# Patient Record
Sex: Male | Born: 1940 | Race: White | Hispanic: No | Marital: Married | Smoking: Former smoker
Health system: Southern US, Community
[De-identification: ages and names within clinical notes are randomized; demographics above are authoritative.]

## PROBLEM LIST (undated history)

## (undated) DIAGNOSIS — I350 Nonrheumatic aortic (valve) stenosis: Secondary | ICD-10-CM

## (undated) DIAGNOSIS — I10 Essential (primary) hypertension: Secondary | ICD-10-CM

## (undated) DIAGNOSIS — E785 Hyperlipidemia, unspecified: Secondary | ICD-10-CM

## (undated) DIAGNOSIS — I2510 Atherosclerotic heart disease of native coronary artery without angina pectoris: Secondary | ICD-10-CM

## (undated) DIAGNOSIS — N189 Chronic kidney disease, unspecified: Secondary | ICD-10-CM

## (undated) DIAGNOSIS — Z952 Presence of prosthetic heart valve: Secondary | ICD-10-CM

## (undated) DIAGNOSIS — E119 Type 2 diabetes mellitus without complications: Secondary | ICD-10-CM

## (undated) DIAGNOSIS — I4510 Unspecified right bundle-branch block: Secondary | ICD-10-CM

## (undated) DIAGNOSIS — I480 Paroxysmal atrial fibrillation: Secondary | ICD-10-CM

## (undated) DIAGNOSIS — J449 Chronic obstructive pulmonary disease, unspecified: Secondary | ICD-10-CM

## (undated) DIAGNOSIS — E669 Obesity, unspecified: Secondary | ICD-10-CM

## (undated) HISTORY — PX: CARDIAC SURGERY: SHX584

## (undated) HISTORY — PX: FINGER AMPUTATION: SHX636

## (undated) HISTORY — PX: HERNIA REPAIR: SHX51

## (undated) HISTORY — PX: OTHER SURGICAL HISTORY: SHX169

## (undated) HISTORY — PX: TOE AMPUTATION: SHX809

---

## 2000-02-06 ENCOUNTER — Ambulatory Visit (HOSPITAL_BASED_OUTPATIENT_CLINIC_OR_DEPARTMENT_OTHER): Admission: RE | Admit: 2000-02-06 | Discharge: 2000-02-06 | Payer: Self-pay | Admitting: Orthopedic Surgery

## 2001-07-25 ENCOUNTER — Ambulatory Visit (HOSPITAL_COMMUNITY): Admission: RE | Admit: 2001-07-25 | Discharge: 2001-07-25 | Payer: Self-pay | Admitting: Cardiology

## 2001-07-25 ENCOUNTER — Encounter: Payer: Self-pay | Admitting: Cardiology

## 2001-08-07 ENCOUNTER — Ambulatory Visit (HOSPITAL_COMMUNITY): Admission: RE | Admit: 2001-08-07 | Discharge: 2001-08-07 | Payer: Self-pay | Admitting: Cardiology

## 2001-08-08 ENCOUNTER — Encounter: Payer: Self-pay | Admitting: Cardiology

## 2001-08-12 ENCOUNTER — Encounter: Payer: Self-pay | Admitting: Cardiothoracic Surgery

## 2001-08-12 ENCOUNTER — Inpatient Hospital Stay (HOSPITAL_COMMUNITY): Admission: RE | Admit: 2001-08-12 | Discharge: 2001-08-19 | Payer: Self-pay | Admitting: Cardiothoracic Surgery

## 2001-08-13 ENCOUNTER — Encounter: Payer: Self-pay | Admitting: Cardiothoracic Surgery

## 2001-08-14 ENCOUNTER — Encounter: Payer: Self-pay | Admitting: Cardiothoracic Surgery

## 2001-08-15 ENCOUNTER — Encounter: Payer: Self-pay | Admitting: Cardiothoracic Surgery

## 2001-09-05 ENCOUNTER — Encounter: Payer: Self-pay | Admitting: Cardiothoracic Surgery

## 2001-09-05 ENCOUNTER — Encounter: Admission: RE | Admit: 2001-09-05 | Discharge: 2001-09-05 | Payer: Self-pay | Admitting: Cardiothoracic Surgery

## 2001-12-23 ENCOUNTER — Encounter: Admission: RE | Admit: 2001-12-23 | Discharge: 2001-12-23 | Payer: Self-pay | Admitting: Gastroenterology

## 2001-12-23 ENCOUNTER — Encounter: Payer: Self-pay | Admitting: Gastroenterology

## 2002-01-20 ENCOUNTER — Ambulatory Visit (HOSPITAL_COMMUNITY): Admission: RE | Admit: 2002-01-20 | Discharge: 2002-01-20 | Payer: Self-pay | Admitting: Gastroenterology

## 2002-02-09 ENCOUNTER — Inpatient Hospital Stay (HOSPITAL_COMMUNITY): Admission: RE | Admit: 2002-02-09 | Discharge: 2002-02-14 | Payer: Self-pay | Admitting: General Surgery

## 2002-02-23 ENCOUNTER — Encounter: Payer: Self-pay | Admitting: General Surgery

## 2002-02-23 ENCOUNTER — Encounter: Admission: RE | Admit: 2002-02-23 | Discharge: 2002-02-23 | Payer: Self-pay | Admitting: General Surgery

## 2003-09-30 ENCOUNTER — Observation Stay (HOSPITAL_COMMUNITY): Admission: EM | Admit: 2003-09-30 | Discharge: 2003-09-30 | Payer: Self-pay | Admitting: Emergency Medicine

## 2013-02-22 ENCOUNTER — Emergency Department (HOSPITAL_COMMUNITY)
Admission: EM | Admit: 2013-02-22 | Discharge: 2013-02-22 | Disposition: A | Discharge status: Home / Self Care | Payer: Medicare Other | Attending: Emergency Medicine | Admitting: Emergency Medicine

## 2013-02-22 ENCOUNTER — Emergency Department (HOSPITAL_COMMUNITY): Payer: Medicare Other

## 2013-02-22 ENCOUNTER — Encounter (HOSPITAL_COMMUNITY): Payer: Self-pay | Admitting: *Deleted

## 2013-02-22 DIAGNOSIS — Z79899 Other long term (current) drug therapy: Secondary | ICD-10-CM | POA: Insufficient documentation

## 2013-02-22 DIAGNOSIS — J302 Other seasonal allergic rhinitis: Secondary | ICD-10-CM

## 2013-02-22 DIAGNOSIS — Z7982 Long term (current) use of aspirin: Secondary | ICD-10-CM | POA: Insufficient documentation

## 2013-02-22 DIAGNOSIS — I10 Essential (primary) hypertension: Secondary | ICD-10-CM | POA: Insufficient documentation

## 2013-02-22 DIAGNOSIS — N390 Urinary tract infection, site not specified: Secondary | ICD-10-CM | POA: Insufficient documentation

## 2013-02-22 DIAGNOSIS — Z87891 Personal history of nicotine dependence: Secondary | ICD-10-CM | POA: Insufficient documentation

## 2013-02-22 DIAGNOSIS — J301 Allergic rhinitis due to pollen: Secondary | ICD-10-CM | POA: Insufficient documentation

## 2013-02-22 DIAGNOSIS — E119 Type 2 diabetes mellitus without complications: Secondary | ICD-10-CM | POA: Insufficient documentation

## 2013-02-22 HISTORY — DX: Type 2 diabetes mellitus without complications: E11.9

## 2013-02-22 HISTORY — DX: Essential (primary) hypertension: I10

## 2013-02-22 LAB — URINALYSIS, ROUTINE W REFLEX MICROSCOPIC
Bilirubin Urine: NEGATIVE
Glucose, UA: >1000 mg/dL — AB
Ketones, ur: NEGATIVE mg/dL
Leukocytes, UA: NEGATIVE
Nitrite: NEGATIVE
Protein, ur: 100 mg/dL — AB
Specific Gravity, Urine: 1.028 (ref 1.005–1.030)
Urobilinogen, UA: 1 mg/dL (ref 0.0–1.0)
pH: 5.5 (ref 5.0–8.0)

## 2013-02-22 LAB — CBC
MCH: 29.8 pg (ref 26.0–34.0)
MCHC: 35.2 g/dL (ref 30.0–36.0)
MCV: 84.8 fL (ref 78.0–100.0)
Platelets: 133 10*3/uL — ABNORMAL LOW (ref 150–400)
RDW: 14.1 % (ref 11.5–15.5)

## 2013-02-22 LAB — BASIC METABOLIC PANEL
Calcium: 9.1 mg/dL (ref 8.4–10.5)
Creatinine, Ser: 1.95 mg/dL — ABNORMAL HIGH (ref 0.50–1.35)
GFR calc Af Amer: 38 mL/min — ABNORMAL LOW (ref >90)
GFR calc non Af Amer: 33 mL/min — ABNORMAL LOW (ref >90)

## 2013-02-22 MED ORDER — ACETAMINOPHEN 325 MG PO TABS
650.0000 mg | ORAL_TABLET | Freq: Once | ORAL | Status: AC
  Administered 2013-02-22: 650 mg via ORAL
  Filled 2013-02-22: qty 2

## 2013-02-22 MED ORDER — SODIUM CHLORIDE 0.9 % IV BOLUS (SEPSIS)
1000.0000 mL | Freq: Once | INTRAVENOUS | Status: AC
  Administered 2013-02-22: 1000 mL via INTRAVENOUS

## 2013-02-22 MED ORDER — DEXTROSE 5 % IV SOLN
1.0000 g | Freq: Once | INTRAVENOUS | Status: AC
  Administered 2013-02-22: 1 g via INTRAVENOUS
  Filled 2013-02-22: qty 10

## 2013-02-22 MED ORDER — LORATADINE 10 MG PO TABS: 10.0000 mg | ORAL_TABLET | Freq: Every day | ORAL | Status: DC

## 2013-02-22 MED ORDER — SODIUM CHLORIDE 0.9 % IV BOLUS (SEPSIS): 1000.0000 mL | Freq: Once | INTRAVENOUS | Status: DC

## 2013-02-22 MED ORDER — CEPHALEXIN 500 MG PO CAPS: 500.0000 mg | ORAL_CAPSULE | Freq: Four times a day (QID) | ORAL | Status: DC

## 2013-02-22 NOTE — ED Notes (Signed)
[id number] Expected date:<BR> Expected time:<BR> Means of arrival:<BR> Comments:<BR> Lovena Le

## 2013-02-22 NOTE — ED Notes (Signed)
Pt reports he has been having sinus headache and seasonal allergies, also reports having urinary frequency and incontinence since Friday.

## 2013-02-22 NOTE — ED Provider Notes (Signed)
History     CSN: [id number]  Arrival date & time 02/22/13  1201   First MD Initiated Contact with Patient 02/22/13 1500      No chief complaint on file.   (Consider location/radiation/quality/duration/timing/severity/associated sxs/prior treatment) HPI Pt presenting with c/o urinary frequency, mild dysuria.  No fever/chills.  Has had some nausea, no vomiting.  No abdominal pain.  States he didn't eat much yesterday because he was feeling nauseated.  He was afraid to take his insulin.  He also describes sinus congestion, sneezing and frontal headache which he attributes to allergy symptoms.  Also nonproductive cough.  There are no other associated systemic symptoms, there are no other alleviating or modifying factors.   Past Medical History  Diagnosis Date  . Hypertension   . Diabetes mellitus without complication     Past Surgical History  Procedure Laterality Date  . Open heart surgery      History reviewed. No pertinent family history.  History  Substance Use Topics  . Smoking status: Former Smoker    Quit date: 10/29/1978  . Smokeless tobacco: Not on file  . Alcohol Use: No      Review of Systems ROS reviewed and all otherwise negative except for mentioned in HPI  Allergies  Review of patient's allergies indicates no known allergies.  Home Medications   Current Outpatient Rx  Name  Route  Sig  Dispense  Refill  . aspirin 325 MG tablet   Oral   Take 325 mg by mouth daily.         Marland Kitchen diltiazem (TIAZAC) 300 MG 24 hr capsule   Oral   Take 300 mg by mouth daily.         . insulin NPH (HUMULIN N,NOVOLIN N) 100 UNIT/ML injection   Subcutaneous   Inject 30 Units into the skin 3 (three) times daily.         . insulin regular (NOVOLIN R,HUMULIN R) 100 units/mL injection   Subcutaneous   Inject 50 Units into the skin 3 (three) times daily before meals.         . LUTEIN PO   Oral   Take 1 capsule by mouth at bedtime.         . metoprolol  (LOPRESSOR) 50 MG tablet   Oral   Take 50 mg by mouth daily.         . simvastatin (ZOCOR) 20 MG tablet   Oral   Take 20 mg by mouth daily.         . trandolapril (MAVIK) 4 MG tablet   Oral   Take 4 mg by mouth daily.         . cephALEXin (KEFLEX) 500 MG capsule   Oral   Take 1 capsule (500 mg total) by mouth 4 (four) times daily.   20 capsule   0   . loratadine (CLARITIN) 10 MG tablet   Oral   Take 1 tablet (10 mg total) by mouth daily.   30 tablet   0     BP 156/76  Pulse 105  Temp(Src) 99.1 F (37.3 C) (Oral)  Resp 16  SpO2 94% Vitals reviewed Physical Exam Physical Examination: General appearance - alert, well appearing, and in no distress Mental status - alert, oriented to person, place, and time Eyes - no scleral icterus, no conjunctival injection Mouth - mucous membranes moist, pharynx normal without lesions Chest - clear to auscultation, no wheezes, rales or rhonchi, symmetric air entry Heart - normal  rate, regular rhythm, normal S1, S2, no murmurs, rubs, clicks or gallops Abdomen - soft, nontender, nondistended, no masses or organomegaly Extremities - peripheral pulses normal, no pedal edema, no clubbing or cyanosis Skin - normal coloration and turgor, no rashes  ED Course  Procedures (including critical care time)  4:45 PM pt rechecked,  meds and fluids have not been started that I ordered.  Pt is frustrated.  I have talked with the nurse to please hang the meds and fluids as soon as possible.    Labs Reviewed  URINALYSIS, ROUTINE W REFLEX MICROSCOPIC - Abnormal; Notable for the following:    Glucose, UA >1000 (*)    Hgb urine dipstick SMALL (*)    Protein, ur 100 (*)    All other components within normal limits  URINE MICROSCOPIC-ADD ON - Abnormal; Notable for the following:    Bacteria, UA MANY (*)    All other components within normal limits  CBC - Abnormal; Notable for the following:    WBC 19.4 (*)    Platelets 133 (*)    All other  components within normal limits  BASIC METABOLIC PANEL - Abnormal; Notable for the following:    Sodium 132 (*)    Glucose, Bld 261 (*)    BUN 32 (*)    Creatinine, Ser 1.95 (*)    GFR calc non Af Amer 33 (*)    GFR calc Af Amer 38 (*)    All other components within normal limits  URINE CULTURE   Dg Chest 2 View  02/22/2013  *RADIOLOGY REPORT*  Clinical Data: Cough  CHEST - 2 VIEW  Comparison: None.  Findings: Cardiac enlargement.  Prior CABG.  Negative for heart failure.  Negative for pneumonia or effusion.  Mild atelectasis or scarring in both lung bases.  Chronic right rib fractures.  IMPRESSION: No acute abnormality.   Original Report Authenticated By: Carl Best, M.D.      1. Urinary tract infection   2. Seasonal allergies       MDM  Pt presenting with c/o urinary freqeuncy, low grade fever.  Also c/o seasonal allergy symptoms and cough.  Labs reveal UTI, pt with low grade fever and mild tachycardia- treated with rocephin IV as well as IV fluids.  HR improving.  CXR reassuring.  Pt requesting discharge multiple times during ED stay.  Advised claritin for allergy symptoms.  Pt states headache feels better after fluids.  No vomiting.  Will discharge on keflex po.  Close followup with his PMD.  Discharged with strict return precautions.  Pt agreeable with plan.        Threasa Beards, MD 02/22/13 2023

## 2013-02-22 NOTE — ED Notes (Signed)
Patient is alert and oriented x3.  He was given DC instructions and follow up visit instructions.  Patient gave verbal understanding.  He was DC ambulatory under his own power to home.  V/S stable.  He was not showing any signs of distress on DC 

## 2013-02-22 NOTE — ED Notes (Signed)
Patient transported to X-ray 

## 2013-02-24 LAB — URINE CULTURE: Colony Count: >=100000

## 2013-02-25 NOTE — ED Notes (Signed)
+   urine Patient treated with kelfex-sensitive to same-chart appended per protocol MD.

## 2013-12-11 ENCOUNTER — Ambulatory Visit (INDEPENDENT_AMBULATORY_CARE_PROVIDER_SITE_OTHER): Payer: Medicare Other | Admitting: Internal Medicine

## 2013-12-11 ENCOUNTER — Encounter: Payer: Self-pay | Admitting: Internal Medicine

## 2013-12-11 ENCOUNTER — Ambulatory Visit (INDEPENDENT_AMBULATORY_CARE_PROVIDER_SITE_OTHER)
Admission: RE | Admit: 2013-12-11 | Discharge: 2013-12-11 | Disposition: A | Discharge status: Home / Self Care | Payer: Medicare Other | Source: Ambulatory Visit | Attending: Internal Medicine | Admitting: Internal Medicine

## 2013-12-11 VITALS — BP 132/70 | HR 82 | Temp 97.5°F | Ht 69.0 in | Wt 233.4 lb

## 2013-12-11 DIAGNOSIS — I10 Essential (primary) hypertension: Secondary | ICD-10-CM

## 2013-12-11 DIAGNOSIS — J449 Chronic obstructive pulmonary disease, unspecified: Secondary | ICD-10-CM

## 2013-12-11 DIAGNOSIS — R05 Cough: Secondary | ICD-10-CM

## 2013-12-11 DIAGNOSIS — R058 Other specified cough: Secondary | ICD-10-CM

## 2013-12-11 DIAGNOSIS — R059 Cough, unspecified: Secondary | ICD-10-CM

## 2013-12-11 MED ORDER — IRBESARTAN 150 MG PO TABS: 150.0000 mg | ORAL_TABLET | Freq: Every day | ORAL | Status: DC

## 2013-12-11 NOTE — Progress Notes (Signed)
   Subjective:    Patient ID: Cameron [name], male    DOB: [birth year]   MRN: [id number]  HPI  57 yowm quit smoking 1979 and no sequelae ref with new onset cough summer 2014 and then onset of doe referred 12/11/2013 to pulmonary clinic by Dr Doyle Askew   12/11/2013 1st [facility] office visit/ Cameron [name]  Chief Complaint  Patient presents with  . Pulmonary Consult    Referred per Dr. Jackolyn Confer. Pt c/o non prod cough since July 2014. He also c/o DOE with walking to mailbox and back and has occ CP.    new onset July 2014 some better with inhalers but left him feeling wired. Sometimes sob at rest, other times can walk a mile. Problems with cp dating back to heart surgery 30 y .prior to [place]  Already eval by Al Little felt to be due to neuralgia.  No obvious day to day or daytime variabilty or assoc  Classically pleuritic cp or chest tightness, subjective wheeze overt sinus or hb symptoms. No unusual exp hx or h/o childhood pna/ asthma or knowledge of premature birth.  Sleeping ok without nocturnal  or early am exacerbation  of respiratory  c/o's or need for noct saba. Also denies any obvious fluctuation of symptoms with weather or environmental changes or other aggravating or alleviating factors except as outlined above   Current Medications, Allergies, Complete Past Medical History, Past Surgical History, Family History, and Social History were reviewed in [employer] record.             Review of Systems  Constitutional: Negative for fever, chills, activity change, appetite change and unexpected weight change.  HENT: Negative for congestion, dental problem, postnasal drip, rhinorrhea, sneezing, sore throat, trouble swallowing and voice change.   Eyes: Negative for visual disturbance.  Respiratory: Positive for cough and shortness of breath. Negative for choking.   Cardiovascular: Positive for chest pain and leg swelling.  Gastrointestinal: Negative for nausea,  vomiting and abdominal pain.  Genitourinary: Negative for difficulty urinating.  Musculoskeletal: Negative for arthralgias.  Skin: Negative for rash.  Psychiatric/Behavioral: Negative for behavioral problems and confusion.       Objective:   Physical Exam  Hoarse amb wm nad  Wt Readings from Last 3 Encounters:  12/11/13 233 lb 6.4 oz (105.87 kg)      HEENT: nl dentition, turbinates, and orophanx. Nl external ear canals without cough reflex   NECK :  without JVD/Nodes/TM/ nl carotid upstrokes bilaterally   LUNGS: no acc muscle use, clear to A and P bilaterally without cough on insp or exp maneuvers   CV:  RRR  II/ VI SEM  no s3   or increase in P2, 1+ on R edema / trace on L   ABD: obese/  soft and nontender with nl excursion in the supine position. No bruits or organomegaly, bowel sounds nl  MS:  warm without deformities, calf tenderness, cyanosis or clubbing  SKIN: warm and dry without lesions    NEURO:  alert, approp, no deficits   CXR  12/11/2013 : No active cardiopulmonary disease. Again noted old right rib fractures. Status post CABG.         Assessment & Plan:

## 2013-12-11 NOTE — Patient Instructions (Addendum)
Stop mavik  Avapro 150 mg one daily instead - break in half if too strong  Please remember to go to the x-ray department downstairs for your tests - we will call you with the results when they are available.     Please schedule a follow up office visit in 6 weeks, call sooner if needed with pfts on return

## 2013-12-11 NOTE — Progress Notes (Signed)
Quick Note:  Spoke with pt and notified of results per Dr. Wert. Pt verbalized understanding and denied any questions.  ______ 

## 2013-12-13 DIAGNOSIS — R058 Other specified cough: Secondary | ICD-10-CM | POA: Insufficient documentation

## 2013-12-13 DIAGNOSIS — R05 Cough: Secondary | ICD-10-CM | POA: Insufficient documentation

## 2013-12-13 NOTE — Assessment & Plan Note (Signed)
DDX of  difficult airways managment all start with A and  include Adherence, Ace Inhibitors, Acid Reflux, Active Sinus Disease, Alpha 1 Antitripsin deficiency, Anxiety masquerading as Airways dz,  ABPA,  allergy(esp in young), Aspiration (esp in elderly), Adverse effects of DPI,  Active smokers, plus two Bs  = Bronchiectasis and Beta blocker use..and one C= CHF  ACEi at top of list of usual suspects here > will regroup in 4 weeks with pft and go from there

## 2013-12-13 NOTE — Assessment & Plan Note (Signed)
ACE inhibitors are problematic in  pts with airway complaints because  even experienced pulmonologists can't always distinguish ace effects from copd/asthma.  By themselves they don't actually cause a problem, much like oxygen can't by itself start a fire, but they certainly serve as a powerful catalyst or enhancer for any "fire"  or inflammatory process in the upper airway, be it caused by an ET  tube or more commonly reflux (especially in the obese or pts with known GERD or who are on biphoshonates).    In the era of ARB near equivalency until we have a better handle on the reversibility of the airway problem, it just makes sense to avoid ACEI  entirely in the short run and then decide later, having established a level of airway control using a reasonable limited regimen, whether to add back ace but even then being very careful to observe the pt for worsening airway control and number of meds used/ needed to control symptoms.    rec stop mavik and start avapro 150 trial basis   See instructions for specific recommendations which were reviewed directly with the patient who was given a copy with highlighter outlining the key components.

## 2013-12-13 NOTE — Assessment & Plan Note (Addendum)
Classic Upper airway cough syndrome, so named because it's frequently impossible to sort out how much is  CR/sinusitis with freq throat clearing (which can be related to primary GERD)   vs  causing  secondary (" extra esophageal")  GERD from wide swings in gastric pressure that occur with throat clearing, often  promoting self use of mint and menthol lozenges that reduce the lower esophageal sphincter tone and exacerbate the problem further in a cyclical fashion.   These are the same pts (now being labeled as having "irritable larynx syndrome" by some cough centers) who not infrequently have a history of having failed to tolerate ace inhibitors,  dry powder inhalers or biphosphonates or report having atypical reflux symptoms that don't respond to standard doses of PPI , and are easily confused as having aecopd or asthma flares by even experienced allergists/ pulmonologists.  Only way to know role of acei here is try off and f/u 4-6 weeks > see hbp a/p

## 2013-12-26 ENCOUNTER — Emergency Department (HOSPITAL_COMMUNITY)
Admission: EM | Admit: 2013-12-26 | Discharge: 2013-12-26 | Disposition: A | Discharge status: Home / Self Care | Payer: Medicare Other | Attending: Emergency Medicine | Admitting: Emergency Medicine

## 2013-12-26 ENCOUNTER — Telehealth: Payer: Self-pay | Admitting: Pulmonary Disease

## 2013-12-26 ENCOUNTER — Emergency Department (HOSPITAL_COMMUNITY): Payer: Medicare Other

## 2013-12-26 ENCOUNTER — Encounter (HOSPITAL_COMMUNITY): Payer: Self-pay | Admitting: Emergency Medicine

## 2013-12-26 DIAGNOSIS — R059 Cough, unspecified: Secondary | ICD-10-CM

## 2013-12-26 DIAGNOSIS — E119 Type 2 diabetes mellitus without complications: Secondary | ICD-10-CM | POA: Insufficient documentation

## 2013-12-26 DIAGNOSIS — I10 Essential (primary) hypertension: Secondary | ICD-10-CM | POA: Insufficient documentation

## 2013-12-26 DIAGNOSIS — Z9889 Other specified postprocedural states: Secondary | ICD-10-CM | POA: Insufficient documentation

## 2013-12-26 DIAGNOSIS — Z87891 Personal history of nicotine dependence: Secondary | ICD-10-CM | POA: Insufficient documentation

## 2013-12-26 DIAGNOSIS — R739 Hyperglycemia, unspecified: Secondary | ICD-10-CM

## 2013-12-26 DIAGNOSIS — Z794 Long term (current) use of insulin: Secondary | ICD-10-CM | POA: Insufficient documentation

## 2013-12-26 DIAGNOSIS — Z7982 Long term (current) use of aspirin: Secondary | ICD-10-CM | POA: Insufficient documentation

## 2013-12-26 DIAGNOSIS — E669 Obesity, unspecified: Secondary | ICD-10-CM | POA: Insufficient documentation

## 2013-12-26 DIAGNOSIS — R05 Cough: Secondary | ICD-10-CM

## 2013-12-26 DIAGNOSIS — R0609 Other forms of dyspnea: Secondary | ICD-10-CM | POA: Insufficient documentation

## 2013-12-26 DIAGNOSIS — R0989 Other specified symptoms and signs involving the circulatory and respiratory systems: Secondary | ICD-10-CM | POA: Insufficient documentation

## 2013-12-26 DIAGNOSIS — H9209 Otalgia, unspecified ear: Secondary | ICD-10-CM | POA: Insufficient documentation

## 2013-12-26 DIAGNOSIS — I4891 Unspecified atrial fibrillation: Secondary | ICD-10-CM | POA: Insufficient documentation

## 2013-12-26 DIAGNOSIS — Z79899 Other long term (current) drug therapy: Secondary | ICD-10-CM | POA: Insufficient documentation

## 2013-12-26 LAB — BASIC METABOLIC PANEL
BUN: 33 mg/dL — AB (ref 6–23)
CALCIUM: 9.7 mg/dL (ref 8.4–10.5)
CO2: 23 meq/L (ref 19–32)
Chloride: 99 mEq/L (ref 96–112)
Creatinine, Ser: 2.08 mg/dL — ABNORMAL HIGH (ref 0.50–1.35)
GFR calc Af Amer: 35 mL/min — ABNORMAL LOW (ref >90)
GFR, EST NON AFRICAN AMERICAN: 30 mL/min — AB (ref >90)
GLUCOSE: 261 mg/dL — AB (ref 70–99)
POTASSIUM: 4.9 meq/L (ref 3.7–5.3)
SODIUM: 139 meq/L (ref 137–147)

## 2013-12-26 LAB — CBC
HEMATOCRIT: 46.2 % (ref 39.0–52.0)
HEMOGLOBIN: 15.9 g/dL (ref 13.0–17.0)
MCH: 30.1 pg (ref 26.0–34.0)
MCHC: 34.4 g/dL (ref 30.0–36.0)
MCV: 87.5 fL (ref 78.0–100.0)
Platelets: 144 10*3/uL — ABNORMAL LOW (ref 150–400)
RBC: 5.28 MIL/uL (ref 4.22–5.81)
RDW: 13.9 % (ref 11.5–15.5)
WBC: 7.5 10*3/uL (ref 4.0–10.5)

## 2013-12-26 LAB — TROPONIN I: Troponin I: <0.3 ng/mL (ref <0.30)

## 2013-12-26 LAB — D-DIMER, QUANTITATIVE: D-Dimer, Quant: 0.51 ug/mL-FEU — ABNORMAL HIGH (ref 0.00–0.48)

## 2013-12-26 MED ORDER — IPRATROPIUM BROMIDE 0.02 % IN SOLN
0.5000 mg | Freq: Once | RESPIRATORY_TRACT | Status: AC
  Administered 2013-12-26: 0.5 mg via RESPIRATORY_TRACT
  Filled 2013-12-26 (×2): qty 2.5

## 2013-12-26 MED ORDER — ALBUTEROL SULFATE (2.5 MG/3ML) 0.083% IN NEBU
5.0000 mg | INHALATION_SOLUTION | Freq: Once | RESPIRATORY_TRACT | Status: AC
  Administered 2013-12-26: 5 mg via RESPIRATORY_TRACT
  Filled 2013-12-26: qty 6

## 2013-12-26 NOTE — ED Notes (Signed)
Called Lab in reference to CBC and Bmp that was drawn at 1515 that has not resulted. Lab had not processed. Waiting for the results.

## 2013-12-26 NOTE — ED Notes (Signed)
Per ems: pt called for productive cough with clear mucous x 3 weeks. No fevers. Pt was given oral abx for an ear infection initially and symptoms have persisted. En route, VSS, a&Ox4.

## 2013-12-26 NOTE — ED Provider Notes (Signed)
CSN: [id number]     Arrival date & time 12/26/13  1357 History   First MD Initiated Contact with Patient 12/26/13 1400     Chief Complaint  Patient presents with  . Cough     (Consider location/radiation/quality/duration/timing/severity/associated sxs/prior Treatment) The history is provided by the patient.   Cameron [name] is a 73 y.o. male who states that he has had a cough since July 2014. He describes being on numerous antibiotics. He saw a pulmonologist about a month ago and he was taken off his ACE inhibitor as a possible cause. The pulmonologist plan to see him in followup for pulmonary function testing. The patient has been sleeping sitting up because of an ear infection. He has increased ear pain when lying down. He has mild exertional dyspnea. He does not have orthopnea. His cough is occasionally productive of sputum. He does not smoke cigarettes currently. He denies fever, chills, nausea or vomiting. There are no other known modifying factors.   Past Medical History  Diagnosis Date  . Hypertension   . Diabetes mellitus without complication   . A-fib    Past Surgical History  Procedure Laterality Date  . Open heart surgery    . Cardiac surgery    . Hernia repair     Family History  Problem Relation Age of Onset  . Stomach cancer Paternal Grandfather   . Lung cancer Maternal Grandmother     smoked  . Heart disease Father    History  Substance Use Topics  . Smoking status: Former Smoker -- 2.00 packs/day for 20 years    Types: Cigarettes    Quit date: 09/28/1978  . Smokeless tobacco: Not on file  . Alcohol Use: No    Review of Systems  All other systems reviewed and are negative.      Allergies  Cefuroxime  Home Medications   Current Outpatient Rx  Name  Route  Sig  Dispense  Refill  . aspirin 325 MG tablet   Oral   Take 325 mg by mouth daily.         Marland Kitchen diltiazem (TIAZAC) 300 MG 24 hr capsule   Oral   Take 300 mg by mouth daily.         .  Fluticasone-Salmeterol (ADVAIR DISKUS IN)   Inhalation   Inhale 1 puff into the lungs daily.         . insulin NPH (HUMULIN N,NOVOLIN N) 100 UNIT/ML injection   Subcutaneous   Inject 40 Units into the skin 3 (three) times daily.          . insulin regular (NOVOLIN R,HUMULIN R) 100 units/mL injection   Subcutaneous   Inject 60 Units into the skin 3 (three) times daily before meals.          . irbesartan (AVAPRO) 150 MG tablet   Oral   Take 1 tablet (150 mg total) by mouth daily.   30 tablet   11   . metoprolol (LOPRESSOR) 50 MG tablet   Oral   Take 50 mg by mouth daily.         . simvastatin (ZOCOR) 20 MG tablet   Oral   Take 20 mg by mouth daily.          BP 131/46  Pulse 81  Temp(Src) 98.1 F (36.7 C) (Oral)  Resp 18  SpO2 94% Physical Exam  Nursing note and vitals reviewed. Constitutional: He is oriented to person, place, and time. He appears well-developed.  Elderly, obese  HENT:  Head: Normocephalic and atraumatic.  Right Ear: External ear normal.  Left Ear: External ear normal.  Eyes: Conjunctivae and EOM are normal. Pupils are equal, round, and reactive to light.  Neck: Normal range of motion and phonation normal. Neck supple.  Cardiovascular: Normal rate, regular rhythm, normal heart sounds and intact distal pulses.   Pulmonary/Chest: Effort normal and breath sounds normal. No respiratory distress. He has no wheezes. He has no rales. He exhibits no bony tenderness.  Abdominal: Soft. Normal appearance. There is no tenderness.  Musculoskeletal: Normal range of motion.  Neurological: He is alert and oriented to person, place, and time. No cranial nerve deficit or sensory deficit. He exhibits normal muscle tone. Coordination normal.  Skin: Skin is warm, dry and intact.  Psychiatric: He has a normal mood and affect. His behavior is normal. Judgment and thought content normal.    ED Course  Procedures (including critical care time) Medications   albuterol (PROVENTIL) (2.5 MG/3ML) 0.083% nebulizer solution 5 mg (5 mg Nebulization Given 12/26/13 1540)  ipratropium (ATROVENT) nebulizer solution 0.5 mg (0.5 mg Nebulization Given 12/26/13 1540)    Patient Vitals for the past 24 hrs:  BP Temp Temp src Pulse Resp SpO2  12/26/13 1836 131/46 mmHg 98.1 F (36.7 C) Oral 81 18 94 %  12/26/13 1630 120/60 mmHg - - 83 - 93 %  12/26/13 1615 140/58 mmHg - - 78 - 99 %  12/26/13 1600 116/46 mmHg - - 74 - 98 %  12/26/13 1545 122/51 mmHg - - 74 - 99 %  12/26/13 1530 149/54 mmHg - - 76 - 91 %  12/26/13 1515 148/63 mmHg - - 77 - 91 %  12/26/13 1500 143/57 mmHg - - 72 - 92 %  12/26/13 1409 153/62 mmHg - - 76 - 94 %  12/26/13 1400 147/62 mmHg 98.6 F (37 C) Oral 79 - 97 %   16:30- I discussed the case with the pulmonologist, Dr. Elsworth Soho- he will contact the office to arrange for an earlier followup appointment, the patient  At discharge- Reevaluation with update and discussion. After initial assessment and treatment, an updated evaluation reveals he feels better. He prefers not starting an albuterol inhaler at home. [place] Review Labs Reviewed  CBC - Abnormal; Notable for the following:    Platelets 144 (*)    All other components within normal limits  BASIC METABOLIC PANEL - Abnormal; Notable for the following:    Glucose, Bld 261 (*)    BUN 33 (*)    Creatinine, Ser 2.08 (*)    GFR calc non Af Amer 30 (*)    GFR calc Af Amer 35 (*)    All other components within normal limits  D-DIMER, QUANTITATIVE - Abnormal; Notable for the following:    D-Dimer, Quant 0.51 (*)    All other components within normal limits  TROPONIN I   Imaging Review Dg Chest 2 View  12/26/2013   CLINICAL DATA:  Persistent cough. Previous coronary bypass grafting.  EXAM: CHEST - 2 VIEW  COMPARISON:  12/11/2013  FINDINGS: Previous CABG. Stable cardiomegaly. Improved aeration with some residual linear scarring or subsegmental atelectasis at the lung  bases left greater than right. No focal infiltrate or overt edema. . No effusion. Spurring in the lower thoracic spine. Multiple old right rib fractures.  IMPRESSION: 1. Improved aeration with minimal residual scarring/ atelectasis at the lung bases.   Electronically Signed   By:  Arne Cleveland M.D.   On: 12/26/2013 14:43     EKG Interpretation None      MDM   Final diagnoses:  Cough  Hyperglycemia    Nonspecific cough, without evidence for pneumonia, significant bronchospasm, PE, or ACS. His d-dimer is within the adjusted-age normal range.  Nursing Notes Reviewed/ Care Coordinated Applicable Imaging Reviewed Interpretation of Laboratory Data incorporated into ED treatment  The patient appears reasonably screened and/or stabilized for discharge and I doubt any other medical condition or other [facility] requiring further screening, evaluation, or treatment in the ED at this time prior to discharge.  Plan: Home Medications- usual; Home Treatments- rest; return here if the recommended treatment, does not improve the symptoms; Recommended follow up- pulmonary followup as soon as possible     Richarda Blade, MD 12/26/13 2154

## 2013-12-26 NOTE — Discharge Instructions (Signed)
Cough, Adult  A cough is a reflex. It helps you clear your throat and airways. A cough can help heal your body. A cough can last 2 or 3 weeks (acute) or may last more than 8 weeks (chronic). Some common causes of a cough can include an infection, allergy, or a cold. HOME CARE  Only take medicine as told by your doctor.  If given, take your medicines (antibiotics) as told. Finish them even if you start to feel better.  Use a cold steam vaporizer or humidier in your home. This can help loosen thick spit (secretions).  Sleep so you are almost sitting up (semi-upright). Use pillows to do this. This helps reduce coughing.  Rest as needed.  Stop smoking if you smoke. GET HELP RIGHT AWAY IF:  You have yellowish-white fluid (pus) in your thick spit.  Your cough gets worse.  Your medicine does not reduce coughing, and you are losing sleep.  You cough up blood.  You have trouble breathing.  Your pain gets worse and medicine does not help.  You have a fever. MAKE SURE YOU:   Understand these instructions.  Will watch your condition.  Will get help right away if you are not doing well or get worse. Document Released: 06/28/2011 Document Revised: 01/07/2012 Document Reviewed: 06/28/2011 [facility] Patient Information 2014 [place].  Hyperglycemia Hyperglycemia occurs when the glucose (sugar) in your blood is too high. Hyperglycemia can happen for many reasons, but it most often happens to people who do not know they have diabetes or are not managing their diabetes properly.  CAUSES  Whether you have diabetes or not, there are other causes of hyperglycemia. Hyperglycemia can occur when you have diabetes, but it can also occur in other situations that you might not be as aware of, such as: Diabetes  If you have diabetes and are having problems controlling your blood glucose, hyperglycemia could occur because of some of the following reasons:  Not following your meal plan.  Not  taking your diabetes medications or not taking it properly.  Exercising less or doing less activity than you normally do.  Being sick. Pre-diabetes  This cannot be ignored. Before people develop Type 2 diabetes, they almost always have "pre-diabetes." This is when your blood glucose levels are higher than normal, but not yet high enough to be diagnosed as diabetes. Research has shown that some long-term damage to the body, especially the heart and circulatory system, may already be occurring during pre-diabetes. If you take action to manage your blood glucose when you have pre-diabetes, you may delay or prevent Type 2 diabetes from developing. Stress  If you have diabetes, you may be "diet" controlled or on oral medications or insulin to control your diabetes. However, you may find that your blood glucose is higher than usual in the hospital whether you have diabetes or not. This is often referred to as "stress hyperglycemia." Stress can elevate your blood glucose. This happens because of hormones put out by the body during times of stress. If stress has been the cause of your high blood glucose, it can be followed regularly by your caregiver. That way he/she can make sure your hyperglycemia does not continue to get worse or progress to diabetes. Steroids  Steroids are medications that act on the infection fighting system (immune system) to block inflammation or infection. One side effect can be a rise in blood glucose. Most people can produce enough extra insulin to allow for this rise, but for those  who cannot, steroids make blood glucose levels go even higher. It is not unusual for steroid treatments to "uncover" diabetes that is developing. It is not always possible to determine if the hyperglycemia will go away after the steroids are stopped. A special blood test called an A1c is sometimes done to determine if your blood glucose was elevated before the steroids were  started. SYMPTOMS  Thirsty.  Frequent urination.  Dry mouth.  Blurred vision.  Tired or fatigue.  Weakness.  Sleepy.  Tingling in feet or leg. DIAGNOSIS  Diagnosis is made by monitoring blood glucose in one or all of the following ways:  A1c test. This is a chemical found in your blood.  Fingerstick blood glucose monitoring.  Laboratory results. TREATMENT  First, knowing the cause of the hyperglycemia is important before the hyperglycemia can be treated. Treatment may include, but is not be limited to:  Education.  Change or adjustment in medications.  Change or adjustment in meal plan.  Treatment for an illness, infection, etc.  More frequent blood glucose monitoring.  Change in exercise plan.  Decreasing or stopping steroids.  Lifestyle changes. HOME CARE INSTRUCTIONS   Test your blood glucose as directed.  Exercise regularly. Your caregiver will give you instructions about exercise. Pre-diabetes or diabetes which comes on with stress is helped by exercising.  Eat wholesome, balanced meals. Eat often and at regular, fixed times. Your caregiver or nutritionist will give you a meal plan to guide your sugar intake.  Being at an ideal weight is important. If needed, losing as little as 10 to 15 pounds may help improve blood glucose levels. SEEK MEDICAL CARE IF:   You have questions about medicine, activity, or diet.  You continue to have symptoms (problems such as increased thirst, urination, or weight gain). SEEK IMMEDIATE MEDICAL CARE IF:   You are vomiting or have diarrhea.  Your breath smells fruity.  You are breathing faster or slower.  You are very sleepy or incoherent.  You have numbness, tingling, or pain in your feet or hands.  You have chest pain.  Your symptoms get worse even though you have been following your caregiver's orders.  If you have any other questions or concerns. Document Released: 04/10/2001 Document Revised: 01/07/2012  Document Reviewed: 02/11/2012 [facility] Patient Information 2014 [place], [place].

## 2013-12-26 NOTE — ED Notes (Signed)
asst patient to the restroom.

## 2013-12-26 NOTE — ED Notes (Signed)
Patient states he has head congestion and his ears have been stopped up for "a long time". Went to several doctors for it and no one can tell him anything. He is scheduled to see the Pulmonologist on March 26th.

## 2013-12-26 NOTE — Telephone Encounter (Signed)
ED visit for sob + cough MW pt Needs earlier appt Pl call on Monday to schedule

## 2013-12-28 NOTE — Telephone Encounter (Signed)
Pt has been scheduled for ov w/ MW on 3.3.15 @ 10am Will sign off

## 2013-12-29 ENCOUNTER — Ambulatory Visit (INDEPENDENT_AMBULATORY_CARE_PROVIDER_SITE_OTHER): Payer: Medicare Other | Admitting: Internal Medicine

## 2013-12-29 ENCOUNTER — Encounter: Payer: Self-pay | Admitting: Internal Medicine

## 2013-12-29 VITALS — BP 130/78 | HR 72 | Temp 97.6°F | Ht 68.5 in | Wt 232.0 lb

## 2013-12-29 DIAGNOSIS — R059 Cough, unspecified: Secondary | ICD-10-CM

## 2013-12-29 DIAGNOSIS — J449 Chronic obstructive pulmonary disease, unspecified: Secondary | ICD-10-CM

## 2013-12-29 DIAGNOSIS — I10 Essential (primary) hypertension: Secondary | ICD-10-CM

## 2013-12-29 DIAGNOSIS — R058 Other specified cough: Secondary | ICD-10-CM

## 2013-12-29 DIAGNOSIS — J4489 Other specified chronic obstructive pulmonary disease: Secondary | ICD-10-CM

## 2013-12-29 DIAGNOSIS — R05 Cough: Secondary | ICD-10-CM

## 2013-12-29 MED ORDER — BUDESONIDE-FORMOTEROL FUMARATE 80-4.5 MCG/ACT IN AERO: INHALATION_SPRAY | RESPIRATORY_TRACT | Status: DC

## 2013-12-29 MED ORDER — METHYLPREDNISOLONE ACETATE 80 MG/ML IJ SUSP
120.0000 mg | Freq: Once | INTRAMUSCULAR | Status: AC
  Administered 2013-12-29: 120 mg via INTRAMUSCULAR

## 2013-12-29 MED ORDER — TRAMADOL HCL 50 MG PO TABS: ORAL_TABLET | ORAL | Status: DC

## 2013-12-29 NOTE — Patient Instructions (Addendum)
Symbicort 80 Take 2 puffs first thing in am and then another 2 puffs about 12 hours later until return   Try prilosec 20mg   Take 30-60 min before first meal of the day and Pepcid 20 mg one bedtime until return (both are over the counter, short term only )  Take delsym two tsp every 12 hours and supplement if needed with  tramadol 50 mg up to 2 every 4 hours to suppress the urge to cough. Swallowing water or using ice chips/non mint and menthol containing candies (such as lifesavers or sugarless jolly ranchers) are also effective.  You should rest your voice and avoid activities that you know make you cough.  Once you have eliminated the cough for 3 straight days try reducing the tramadol first,  then the delsym as tolerated.    Keep previous appt unless worse in meantime

## 2013-12-29 NOTE — Progress Notes (Signed)
Subjective:    Patient ID: Cameron [name], male    DOB: 09-13-1941   MRN: [id number]   Brief patient profile:  40 yowm quit smoking 1979 and no sequelae ref with new onset cough summer 2014 and then onset of doe referred 12/11/2013 to pulmonary clinic by Dr Doyle Askew    History of Present Illness  12/11/2013 1st [facility] office visit/ Feather Berrie  Chief Complaint  Patient presents with  . Pulmonary Consult    Referred per Dr. Jackolyn Confer. Pt c/o non prod cough since July 2014. He also c/o DOE with walking to mailbox and back and has occ CP.    new onset July 2014 some better with inhalers but left him feeling wired. Sometimes sob at rest, other times can walk a mile. Problems with cp dating back to heart surgery 20 y .prior to [facility]  Already eval by Al Little felt to be due to neuralgia. rec Stop mavik Avapro 150 mg one daily instead - break in half if too strong   See er note 12/26/13    12/29/2013 f/u ov/Kimiye Strathman re: refractory cough/ sob  Chief Complaint  Patient presents with  . Acute Visit    Pt c/o SOB progressively worsening since his last visit. He is unsure if his cough is worse or not. He states that he only uses advair prn, and has used this x 2 since his last visit.   cough remains dry, day > night, assoc with subjective wheeze not responding to advair  No obvious day to day or daytime variabilty or assoc    cp or chest tightness  overt sinus or hb symptoms. No unusual exp hx or h/o childhood pna/ asthma or knowledge of premature birth.  Sleeping ok without nocturnal  or early am exacerbation  of respiratory  c/o's or need for noct saba. Also denies any obvious fluctuation of symptoms with weather or environmental changes or other aggravating or alleviating factors except as outlined above   Current Medications, Allergies, Complete Past Medical History, Past Surgical History, Family History, and Social History were reviewed in [employer] record.  ROS   The following are not active complaints unless bolded sore throat, dysphagia, dental problems, itching, sneezing,  nasal congestion or excess/ purulent secretions, ear ache,   fever, chills, sweats, unintended wt loss, pleuritic or exertional cp, hemoptysis,  orthopnea pnd or leg swelling, presyncope, palpitations, heartburn, abdominal pain, anorexia, nausea, vomiting, diarrhea  or change in bowel or urinary habits, change in stools or urine, dysuria,hematuria,  rash, arthralgias, visual complaints, headache, numbness weakness or ataxia or problems with walking or coordination,  change in mood/affect or memory.                        Objective:   Physical Exam  Hoarse amb wm nad  Wt Readings from Last 3 Encounters:  12/29/13 232 lb (105.235 kg)  12/11/13 233 lb 6.4 oz (105.87 kg)         HEENT: nl dentition, turbinates, and orophanx. Nl external ear canals without cough reflex   NECK :  without JVD/Nodes/TM/ nl carotid upstrokes bilaterally   LUNGS: no acc muscle use,  insp and exp wheezing    CV:  RRR  II/ VI SEM  no s3   or increase in P2, 1+ on R edema / trace on L   ABD: obese/  soft and nontender with nl excursion in the supine position. No bruits or  organomegaly, bowel sounds nl  MS:  warm without deformities, calf tenderness, cyanosis or clubbing  SKIN: warm and dry without lesions    NEURO:  alert, approp, no deficits   CXR  12/11/2013 : No active cardiopulmonary disease. Again noted old right rib fractures. Status post CABG.         Assessment & Plan:

## 2013-12-31 NOTE — Assessment & Plan Note (Signed)
-   trial off acei 12/11/13   Classic Upper airway cough syndrome, so named because it's frequently impossible to sort out how much is  CR/sinusitis with freq throat clearing (which can be related to primary GERD)   vs  causing  secondary (" extra esophageal")  GERD from wide swings in gastric pressure that occur with throat clearing, often  promoting self use of mint and menthol lozenges that reduce the lower esophageal sphincter tone and exacerbate the problem further in a cyclical fashion.   These are the same pts (now being labeled as having "irritable larynx syndrome" by some cough centers) who not infrequently have a history of having failed to tolerate ace inhibitors,  dry powder inhalers or biphosphonates or report having atypical reflux symptoms that don't respond to standard doses of PPI , and are easily confused as having aecopd or asthma flares by even experienced allergists/ pulmonologists.   Too soon to call response or not to d/c acei > continue off and add short term gerd rx

## 2013-12-31 NOTE — Assessment & Plan Note (Signed)
off acei and on arb 12/11/13 due to cough  Adequate control on present rx, reviewed > no change in rx needed

## 2013-12-31 NOTE — Assessment & Plan Note (Signed)
DDX of  difficult airways managment all start with A and  include Adherence, Ace Inhibitors, Acid Reflux, Active Sinus Disease, Alpha 1 Antitripsin deficiency, Anxiety masquerading as Airways dz,  ABPA,  allergy(esp in young), Aspiration (esp in elderly), Adverse effects of DPI,  Active smokers, plus two Bs  = Bronchiectasis and Beta blocker use..and one C= CHF  ? Adverse effects of dpi > try low dose symbicort so as not to aggravate cough any more than necessary to treat the lower airway  ? Allergy > depomedrol 120 IM  The proper method of use, as well as anticipated side effects, of a metered-dose inhaler are discussed and demonstrated to the patient. Improved effectiveness after extensive coaching during this visit to a level of approximately  75%

## 2014-01-22 ENCOUNTER — Ambulatory Visit: Payer: Medicare Other | Admitting: Internal Medicine

## 2014-08-19 ENCOUNTER — Telehealth: Payer: Self-pay | Admitting: Internal Medicine

## 2014-08-19 NOTE — Telephone Encounter (Signed)
Spoke with pt, made aware of below. He refused to see MW and only requested CDY bc he was recommenced. Pt did not schedule appt with anyone else. Will call the ref doc office in AM

## 2014-08-19 NOTE — Telephone Encounter (Signed)
Please advise Dr. Young thanks 

## 2014-08-19 NOTE — Telephone Encounter (Signed)
Fine with me

## 2014-08-19 NOTE — Telephone Encounter (Signed)
Berneda Rose at # provided [phone]. She reports pt is needing to be seen for chronic cough but pt requested to see Dr. Annamaria Boots instead of MW. Please advise if okay to switch? thanks

## 2014-08-19 NOTE — Telephone Encounter (Signed)
I would prefer that someone else see him. I am scheduling 2 months out.

## 2014-08-20 NOTE — Telephone Encounter (Signed)
Called Dr. Jacqlyn Larsen office and was placed on hold x 5 min  WCB later today

## 2014-08-23 NOTE — Telephone Encounter (Signed)
Patient is calling back.  [phone]

## 2014-08-23 NOTE — Telephone Encounter (Signed)
Called and spoke to Cameron [name] to inform her of the status. Cameron [name] stated to call back if pt is only willing to be seen by CY. ATC pt. Family member answered the phone and stated pt will not be back till end of this business week.   Will keep phone note open to call pt back.

## 2014-08-23 NOTE — Telephone Encounter (Signed)
Pt scheduled for Oct 26, 2014 at 3pm with CY Pt was very adamant that he only wanted to see Dr Annamaria Boots, this was the physician that he states was recommended by PCP and a few friends.  Pt states he did not mind how long he has to wait, he wants to CY only.   Called Curt Bears at Dr Olen Pel office, made aware that pt is scheduled for December.  Nothing further needed.

## 2014-10-26 ENCOUNTER — Ambulatory Visit (INDEPENDENT_AMBULATORY_CARE_PROVIDER_SITE_OTHER): Payer: Medicare Other | Admitting: Internal Medicine

## 2014-10-26 ENCOUNTER — Encounter: Payer: Self-pay | Admitting: Internal Medicine

## 2014-10-26 VITALS — BP 120/74 | HR 72 | Ht 69.0 in | Wt 239.4 lb

## 2014-10-26 DIAGNOSIS — J449 Chronic obstructive pulmonary disease, unspecified: Secondary | ICD-10-CM

## 2014-10-26 DIAGNOSIS — R059 Cough, unspecified: Secondary | ICD-10-CM

## 2014-10-26 DIAGNOSIS — R05 Cough: Secondary | ICD-10-CM

## 2014-10-26 DIAGNOSIS — R058 Other specified cough: Secondary | ICD-10-CM

## 2014-10-26 DIAGNOSIS — R0609 Other forms of dyspnea: Secondary | ICD-10-CM

## 2014-10-26 MED ORDER — BENZONATATE 200 MG PO CAPS: ORAL_CAPSULE | ORAL | Status: DC

## 2014-10-26 NOTE — Patient Instructions (Signed)
Order- schedule PFT   Dx chronic cough, dyspnea on exertion  Sample Breo 100   1 puff and rinse mouth, once daily  Sample Incruse Elipta  1 puff, once daily  Script sent for benzonatate perles to use if needed for cough  Please call as needed
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
## 2014-10-29 DIAGNOSIS — R059 Cough, unspecified: Secondary | ICD-10-CM | POA: Insufficient documentation

## 2014-10-29 DIAGNOSIS — R05 Cough: Secondary | ICD-10-CM | POA: Insufficient documentation

## 2014-10-29 NOTE — Assessment & Plan Note (Signed)
Previously attributed to upper airway cough syndrome. He has uncertain degree of COPD, history of coronary disease requiring CABG, history of reflux requiring fundoplication. He associates with exertion. Plan-schedule PFT and treat COPD component. Try Tessalon Perles.

## 2014-10-29 NOTE — Assessment & Plan Note (Signed)
Plan-PFT, try samples of Breo Ellipta and Incruse inhalers

## 2014-12-27 ENCOUNTER — Ambulatory Visit (INDEPENDENT_AMBULATORY_CARE_PROVIDER_SITE_OTHER): Payer: Medicare Other | Admitting: Internal Medicine

## 2014-12-27 ENCOUNTER — Encounter: Payer: Self-pay | Admitting: Internal Medicine

## 2014-12-27 VITALS — BP 130/70 | HR 87 | Ht 69.0 in | Wt 237.0 lb

## 2014-12-27 DIAGNOSIS — R0609 Other forms of dyspnea: Secondary | ICD-10-CM

## 2014-12-27 DIAGNOSIS — J449 Chronic obstructive pulmonary disease, unspecified: Secondary | ICD-10-CM | POA: Diagnosis not present

## 2014-12-27 DIAGNOSIS — R05 Cough: Secondary | ICD-10-CM

## 2014-12-27 DIAGNOSIS — R058 Other specified cough: Secondary | ICD-10-CM

## 2014-12-27 LAB — PULMONARY FUNCTION TEST
DL/VA % pred: 136 %
DL/VA: 6.19 ml/min/mmHg/L
DLCO UNC: 24.55 ml/min/mmHg
DLCO unc % pred: 79 %
FEF 25-75 Post: 1.36 L/sec
FEF 25-75 Pre: 1.09 L/sec
FEF2575-%Change-Post: 24 %
FEF2575-%PRED-POST: 62 %
FEF2575-%Pred-Pre: 50 %
FEV1-%Change-Post: 4 %
FEV1-%PRED-POST: 67 %
FEV1-%Pred-Pre: 64 %
FEV1-POST: 1.99 L
FEV1-Pre: 1.9 L
FEV1FVC-%Change-Post: 2 %
FEV1FVC-%Pred-Pre: 94 %
FEV6-%Change-Post: 3 %
FEV6-%Pred-Post: 73 %
FEV6-%Pred-Pre: 70 %
FEV6-POST: 2.8 L
FEV6-PRE: 2.71 L
FEV6FVC-%CHANGE-POST: 0 %
FEV6FVC-%PRED-POST: 105 %
FEV6FVC-%PRED-PRE: 104 %
FVC-%Change-Post: 2 %
FVC-%Pred-Post: 68 %
FVC-%Pred-Pre: 67 %
FVC-Post: 2.82 L
FVC-Pre: 2.75 L
PRE FEV6/FVC RATIO: 98 %
Post FEV1/FVC ratio: 71 %
Post FEV6/FVC ratio: 99 %
Pre FEV1/FVC ratio: 69 %
RV % PRED: 126 %
RV: 3.16 L
TLC % pred: 83 %
TLC: 5.69 L

## 2014-12-27 NOTE — Patient Instructions (Addendum)
Sample Stiolto inhaler    2 puffs once daily    Try this instead of the Breo and Incruse samples given last time  Try to do some walking- gradually increase as able
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
## 2014-12-27 NOTE — Progress Notes (Signed)
PFT done today. 

## 2014-12-28 NOTE — Assessment & Plan Note (Signed)
There is probably some chronic bronchitis

## 2015-02-22 ENCOUNTER — Ambulatory Visit: Payer: Medicare Other | Admitting: Endocrinology

## 2015-03-01 ENCOUNTER — Ambulatory Visit: Payer: Medicare Other | Admitting: Internal Medicine

## 2015-12-06 ENCOUNTER — Emergency Department (HOSPITAL_COMMUNITY): Payer: Medicare Other

## 2015-12-06 ENCOUNTER — Encounter (HOSPITAL_COMMUNITY): Payer: Self-pay | Admitting: Emergency Medicine

## 2015-12-06 ENCOUNTER — Emergency Department (HOSPITAL_COMMUNITY)
Admission: EM | Admit: 2015-12-06 | Discharge: 2015-12-07 | Disposition: A | Discharge status: Home / Self Care | Payer: Medicare Other | Attending: Emergency Medicine | Admitting: Emergency Medicine

## 2015-12-06 DIAGNOSIS — Z87891 Personal history of nicotine dependence: Secondary | ICD-10-CM | POA: Insufficient documentation

## 2015-12-06 DIAGNOSIS — W01198A Fall on same level from slipping, tripping and stumbling with subsequent striking against other object, initial encounter: Secondary | ICD-10-CM | POA: Insufficient documentation

## 2015-12-06 DIAGNOSIS — Y998 Other external cause status: Secondary | ICD-10-CM | POA: Insufficient documentation

## 2015-12-06 DIAGNOSIS — Y9289 Other specified places as the place of occurrence of the external cause: Secondary | ICD-10-CM | POA: Insufficient documentation

## 2015-12-06 DIAGNOSIS — I10 Essential (primary) hypertension: Secondary | ICD-10-CM | POA: Insufficient documentation

## 2015-12-06 DIAGNOSIS — Z794 Long term (current) use of insulin: Secondary | ICD-10-CM | POA: Insufficient documentation

## 2015-12-06 DIAGNOSIS — S0001XA Abrasion of scalp, initial encounter: Secondary | ICD-10-CM | POA: Diagnosis not present

## 2015-12-06 DIAGNOSIS — E119 Type 2 diabetes mellitus without complications: Secondary | ICD-10-CM | POA: Insufficient documentation

## 2015-12-06 DIAGNOSIS — Z79899 Other long term (current) drug therapy: Secondary | ICD-10-CM | POA: Diagnosis not present

## 2015-12-06 DIAGNOSIS — Y9389 Activity, other specified: Secondary | ICD-10-CM | POA: Diagnosis not present

## 2015-12-06 DIAGNOSIS — R42 Dizziness and giddiness: Secondary | ICD-10-CM | POA: Insufficient documentation

## 2015-12-06 DIAGNOSIS — Z880 Allergy status to penicillin: Secondary | ICD-10-CM | POA: Insufficient documentation

## 2015-12-06 DIAGNOSIS — S0003XA Contusion of scalp, initial encounter: Secondary | ICD-10-CM | POA: Insufficient documentation

## 2015-12-06 DIAGNOSIS — S0990XA Unspecified injury of head, initial encounter: Secondary | ICD-10-CM | POA: Diagnosis present

## 2015-12-06 DIAGNOSIS — T148XXA Other injury of unspecified body region, initial encounter: Secondary | ICD-10-CM

## 2015-12-06 MED ORDER — TETANUS-DIPHTH-ACELL PERTUSSIS 5-2.5-18.5 LF-MCG/0.5 IM SUSP
0.5000 mL | INTRAMUSCULAR | Status: DC | PRN
  Filled 2015-12-06: qty 0.5

## 2015-12-06 NOTE — Discharge Instructions (Signed)
Facial or Scalp Contusion A facial or scalp contusion is a deep bruise on the face or head. Injuries to the face and head generally cause a lot of swelling, especially around the eyes. Contusions are the result of an injury that caused bleeding under the skin. The contusion may turn blue, purple, or yellow. Minor injuries will give you a painless contusion, but more severe contusions may stay painful and swollen for a few weeks.  CAUSES  A facial or scalp contusion is caused by a blunt injury or trauma to the face or head area.  SIGNS AND SYMPTOMS   Swelling of the injured area.   Discoloration of the injured area.   Tenderness, soreness, or pain in the injured area.  DIAGNOSIS  The diagnosis can be made by taking a medical history and doing a physical exam. An X-ray exam, CT scan, or MRI may be needed to determine if there are any associated injuries, such as broken bones (fractures). TREATMENT  Often, the best treatment for a facial or scalp contusion is applying cold compresses to the injured area. Over-the-counter medicines may also be recommended for pain control.  HOME CARE INSTRUCTIONS   Only take over-the-counter or prescription medicines as directed by your health care provider.   Apply ice to the injured area.   Put ice in a plastic bag.   Place a towel between your skin and the bag.   Leave the ice on for 20 minutes, 2-3 times a day.  SEEK MEDICAL CARE IF:  You have bite problems.   You have pain with chewing.   You are concerned about facial defects. SEEK IMMEDIATE MEDICAL CARE IF:  You have severe pain or a headache that is not relieved by medicine.   You have unusual sleepiness, confusion, or personality changes.   You throw up (vomit).   You have a persistent nosebleed.   You have double vision or blurred vision.   You have fluid drainage from your nose or ear.   You have difficulty walking or using your arms or legs.  MAKE SURE YOU:    Understand these instructions.  Will watch your condition.  Will get help right away if you are not doing well or get worse.   This information is not intended to replace advice given to you by your health care provider. Make sure you discuss any questions you have with your health care provider.   Document Released: 11/22/2004 Document Revised: 11/05/2014 Document Reviewed: 05/28/2013 Elsevier Interactive Patient Education 2016 [employer].  Vertigo Vertigo means you feel like you or your surroundings are moving when they are not. Vertigo can be dangerous if it occurs when you are at work, driving, or performing difficult activities.  CAUSES  Vertigo occurs when there is a conflict of signals sent to your brain from the visual and sensory systems in your body. There are many different causes of vertigo, including:  Infections, especially in the inner ear.  A bad reaction to a drug or misuse of alcohol and medicines.  Withdrawal from drugs or alcohol.  Rapidly changing positions, such as lying down or rolling over in bed.  A migraine headache.  Decreased blood flow to the brain.  Increased pressure in the brain from a head injury, infection, tumor, or bleeding. SYMPTOMS  You may feel as though the world is spinning around or you are falling to the ground. Because your balance is upset, vertigo can cause nausea and vomiting. You may have involuntary eye movements (  nystagmus). DIAGNOSIS  Vertigo is usually diagnosed by physical exam. If the cause of your vertigo is unknown, your caregiver may perform imaging tests, such as an MRI scan (magnetic resonance imaging). TREATMENT  Most cases of vertigo resolve on their own, without treatment. Depending on the cause, your caregiver may prescribe certain medicines. If your vertigo is related to body position issues, your caregiver may recommend movements or procedures to correct the problem. In rare cases, if your vertigo is caused by  certain inner ear problems, you may need surgery. HOME CARE INSTRUCTIONS   Follow your caregiver's instructions.  Avoid driving.  Avoid operating heavy machinery.  Avoid performing any tasks that would be dangerous to you or others during a vertigo episode.  Tell your caregiver if you notice that certain medicines seem to be causing your vertigo. Some of the medicines used to treat vertigo episodes can actually make them worse in some people. SEEK IMMEDIATE MEDICAL CARE IF:   Your medicines do not relieve your vertigo or are making it worse.  You develop problems with talking, walking, weakness, or using your arms, hands, or legs.  You develop severe headaches.  Your nausea or vomiting continues or gets worse.  You develop visual changes.  A family member notices behavioral changes.  Your condition gets worse. MAKE SURE YOU:  Understand these instructions.  Will watch your condition.  Will get help right away if you are not doing well or get worse.   This information is not intended to replace advice given to you by your health care provider. Make sure you discuss any questions you have with your health care provider.   Document Released: 07/25/2005 Document Revised: 01/07/2012 Document Reviewed: 02/07/2015 Elsevier Interactive Patient Education [employer].

## 2015-12-06 NOTE — ED Provider Notes (Signed)
CSN: [id number]     Arrival date & time 12/06/15  1857 History  By signing my name below, I, Evelene Croon, attest that this documentation has been prepared under the direction and in the presence of non-physician practitioner, Linus Mako, PA-C. Electronically Signed: Evelene Croon, Scribe. 12/06/2015. 11:11 PM.     Chief Complaint  Patient presents with  . Fall  . Head Injury    The history is provided by the patient. No language interpreter was used.     HPI Comments:  Cameron [name] is a 75 y.o. male with a history of vertigo who presents to the Emergency Department complaining of intermittent episodes of room spinning dizziness that began ~ 1730 today. Pt states he lost his balance and then fell backwards striking his head on a door. He states his dizziness today is similar to past episodes of vertigo and his last episode of vertigo was ~ 2 months ago. He was worked up by his PCP for the vertigo but denies having MRI or Head CT done. He was given an rx for dramamine.  He reports associated laceration to the back of his head and mild frontal HA. No alleviating factors noted. Pt denies use of blood thinners. He also denies acute neck pain.  PCP: Christella Noa  ROS: The patient denies diaphoresis, fever,weakness (general or focal), confusion, change of vision,  dysphagia, aphagia, shortness of breath,  abdominal pains, nausea, vomiting, diarrhea, lower extremity swelling, rash, neck pain, chest pain   Past Medical History  Diagnosis Date  . Hypertension   . Diabetes mellitus without complication ([place])   . A-fib [facility])    Past Surgical History  Procedure Laterality Date  . Open heart surgery    . Cardiac surgery    . Hernia repair     Family History  Problem Relation Age of Onset  . Stomach cancer Paternal Grandfather   . Lung cancer Maternal Grandmother     smoked  . Heart disease Father    Social History  Substance Use Topics  . Smoking status: Former Smoker -- 2.00  packs/day for 20 years    Types: Cigarettes    Quit date: 09/28/1978  . Smokeless tobacco: None  . Alcohol Use: No    Review of Systems  Musculoskeletal: Negative for neck pain.  Skin: Positive for wound.  Neurological: Positive for dizziness and headaches. Negative for facial asymmetry, speech difficulty, weakness and numbness.  All other systems reviewed and are negative.   Allergies  Amoxicillin; Cefuroxime; Ezetimibe; and Pravastatin  Home Medications   Prior to Admission medications   Medication Sig Start Date End Date Taking? Authorizing Provider  insulin NPH (HUMULIN N,NOVOLIN N) 100 UNIT/ML injection Inject 50 Units into the skin 2 (two) times daily.    Yes Historical Provider, MD  insulin regular (NOVOLIN R,HUMULIN R) 100 units/mL injection Inject 35 Units into the skin 2 (two) times daily before a meal.    Yes Historical Provider, MD  lisinopril (PRINIVIL,ZESTRIL) 5 MG tablet Take 5 mg by mouth daily. 09/08/15  Yes Historical Provider, MD  Multiple Vitamins-Minerals (PRESERVISION AREDS 2 PO) Take 1 tablet by mouth daily.   Yes Historical Provider, MD  simvastatin (ZOCOR) 20 MG tablet Take 20 mg by mouth daily.   Yes Historical Provider, MD  vitamin C (ASCORBIC ACID) 500 MG tablet Take 500 mg by mouth daily.   Yes Historical Provider, MD   BP 157/96 mmHg  Pulse 81  Temp(Src) 97.9 F (36.6 C) (Oral)  Resp 16  Ht 5\' 10"  (1.778 m)  Wt 94.348 kg  BMI 29.84 kg/m2  SpO2 95% Physical Exam  Constitutional: He is oriented to person, place, and time. He appears well-developed and well-nourished. No distress.  HENT:  Head: Normocephalic. Head is with contusion and with laceration (skin avulsion laceration to top of scalp that is superficial). Head is without raccoon's eyes, without Battle's sign, without abrasion, without right periorbital erythema and without left periorbital erythema.  Right Ear: Tympanic membrane and ear canal normal.  Left Ear: Tympanic membrane and ear  canal normal.  Nose: Nose normal.  Mouth/Throat: Uvula is midline, oropharynx is clear and moist and mucous membranes are normal.  Eyes: Conjunctivae, EOM and lids are normal. Pupils are equal, round, and reactive to light.  Neck: Normal range of motion. Neck supple. No spinous process tenderness and no muscular tenderness present.  Cardiovascular: Normal rate and regular rhythm.   Pulmonary/Chest: Effort normal.  Abdominal: Soft. He exhibits no distension.  No signs of abdominal distention  Musculoskeletal:  No LE swelling  Neurological: He is alert and oriented to person, place, and time.  Acting at baseline Cranial nerves grossly intact on exam. Pt alert and oriented x 3 Upper and lower extremity strength is symmetrical and physiologic Normal muscular tone No facial droop Coordination intact, no limb ataxia,No pronator drift   Skin: Skin is warm and dry. No rash noted.  Psychiatric: He has a normal mood and affect.  Nursing note and vitals reviewed.   ED Course  Procedures   DIAGNOSTIC STUDIES:  Oxygen Saturation is 95% on RA, normal by my interpretation.    COORDINATION OF CARE:  11: 08 PM Will order head CT. Discussed treatment plan with pt at bedside and pt agreed to plan.  Labs Review Labs Reviewed - No data to display  Imaging Review Ct Head Wo Contrast  12/06/2015  CLINICAL DATA:  Vertigo. Patient fell backward and hit head earlier today. EXAM: CT HEAD WITHOUT CONTRAST TECHNIQUE: Contiguous axial images were obtained from the base of the skull through the vertex without intravenous contrast. COMPARISON:  None. FINDINGS: No evidence for acute infarction, hemorrhage, mass lesion, hydrocephalus, or extra-axial fluid. Mild atrophy. Hypoattenuation of white matter, likely chronic microvascular ischemic change. 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 Tiny LEFT parietal cortical calcification, nonspecific but non worrisome. Vascular calcification affects  the carotid, basilar, and vertebral arteries. Calvarium is intact. No skull fracture is seen. There is a large Pacchionian granulation near the torcula which expands the diploic space, normal variant. No acute sinus or mastoid disease.  Slight posterior scalp hematoma. IMPRESSION: Atrophy.  Mild small vessel disease. Scalp hematoma.  No skull fracture or intracranial hemorrhage. Electronically Signed   By: Staci Righter M.D.   On: 12/06/2015 23:47   I have personally reviewed and evaluated these images as part of my medical decision-making.   MDM   Final diagnoses:  Vertigo  Scalp hematoma, initial encounter  Abrasion   Patient had head CT done in the emergency department which showed no acute findings. I discussed the case with Dr. Dina Rich. Due to downtime there was delay in receiving the results of the CT scan. The patient was agitated due to wait time and requesting to leave. Laceration is superficial and does not require repair, wound cleaned and bandaged. He reports being UTD on Tetanus.  Dr. Dina Rich has evaluated the patient in person as well. He has a normal neurological exam. Unfortunately, we cannot be 100%  sure the patient is not having small TIA's, the patient does not want to stay for MRI or further work-up and requests to leave. He will be advised to start aspirin 324 mg at home daily and will be given a referral to Neurology and strongly encouraged to follow-up as soon as possible. He can return to the ER at anytime for work-up as needed if symptoms persist.   Strict return to ED precautions given.  I personally performed the services described in this documentation, which was scribed in my presence. The recorded information has been reviewed and is accurate.    Delos Haring, PA-C 12/07/15 1950  Merryl Hacker, MD 12/13/15 [phone]

## 2015-12-06 NOTE — ED Notes (Addendum)
Pt states that he has a hx of vertigo and tonight he fell backwards and hit his head on a glass door. Laceration noted. Bleeding controlled. No blood thinners. No LOC.  Alert and oriented.

## 2015-12-07 MED ORDER — BACITRACIN ZINC 500 UNIT/GM EX OINT
TOPICAL_OINTMENT | CUTANEOUS | Status: AC
  Filled 2015-12-07: qty 0.9

## 2015-12-07 MED ORDER — ASPIRIN 325 MG PO TABS
ORAL_TABLET | ORAL | Status: AC
  Filled 2015-12-07: qty 1

## 2015-12-07 NOTE — ED Notes (Addendum)
See paper chart for information regarding pt receiving Tdap, dressing to head wound, aspirin, and discharge paperwork (downtime)

## 2017-09-14 ENCOUNTER — Inpatient Hospital Stay (HOSPITAL_COMMUNITY)
Admission: EM | Admit: 2017-09-14 | Discharge: 2017-09-25 | DRG: 250 | Disposition: A | Discharge status: Home / Self Care | Payer: Medicare Other | Attending: Cardiology | Admitting: Cardiology

## 2017-09-14 ENCOUNTER — Other Ambulatory Visit: Payer: Self-pay

## 2017-09-14 ENCOUNTER — Encounter (HOSPITAL_COMMUNITY): Payer: Self-pay

## 2017-09-14 ENCOUNTER — Emergency Department (HOSPITAL_COMMUNITY): Payer: Medicare Other

## 2017-09-14 DIAGNOSIS — D696 Thrombocytopenia, unspecified: Secondary | ICD-10-CM

## 2017-09-14 DIAGNOSIS — E1065 Type 1 diabetes mellitus with hyperglycemia: Secondary | ICD-10-CM | POA: Diagnosis not present

## 2017-09-14 DIAGNOSIS — E1165 Type 2 diabetes mellitus with hyperglycemia: Secondary | ICD-10-CM | POA: Diagnosis present

## 2017-09-14 DIAGNOSIS — IMO0002 Reserved for concepts with insufficient information to code with codable children: Secondary | ICD-10-CM

## 2017-09-14 DIAGNOSIS — I5033 Acute on chronic diastolic (congestive) heart failure: Secondary | ICD-10-CM | POA: Diagnosis not present

## 2017-09-14 DIAGNOSIS — Z6832 Body mass index (BMI) 32.0-32.9, adult: Secondary | ICD-10-CM

## 2017-09-14 DIAGNOSIS — I130 Hypertensive heart and chronic kidney disease with heart failure and stage 1 through stage 4 chronic kidney disease, or unspecified chronic kidney disease: Secondary | ICD-10-CM | POA: Diagnosis not present

## 2017-09-14 DIAGNOSIS — N179 Acute kidney failure, unspecified: Secondary | ICD-10-CM | POA: Diagnosis not present

## 2017-09-14 DIAGNOSIS — I350 Nonrheumatic aortic (valve) stenosis: Secondary | ICD-10-CM | POA: Diagnosis not present

## 2017-09-14 DIAGNOSIS — Z8249 Family history of ischemic heart disease and other diseases of the circulatory system: Secondary | ICD-10-CM | POA: Diagnosis not present

## 2017-09-14 DIAGNOSIS — F329 Major depressive disorder, single episode, unspecified: Secondary | ICD-10-CM | POA: Diagnosis present

## 2017-09-14 DIAGNOSIS — E1151 Type 2 diabetes mellitus with diabetic peripheral angiopathy without gangrene: Secondary | ICD-10-CM | POA: Diagnosis present

## 2017-09-14 DIAGNOSIS — I2510 Atherosclerotic heart disease of native coronary artery without angina pectoris: Secondary | ICD-10-CM | POA: Diagnosis not present

## 2017-09-14 DIAGNOSIS — Z87891 Personal history of nicotine dependence: Secondary | ICD-10-CM

## 2017-09-14 DIAGNOSIS — I200 Unstable angina: Secondary | ICD-10-CM | POA: Diagnosis present

## 2017-09-14 DIAGNOSIS — E669 Obesity, unspecified: Secondary | ICD-10-CM | POA: Diagnosis present

## 2017-09-14 DIAGNOSIS — Z9861 Coronary angioplasty status: Secondary | ICD-10-CM

## 2017-09-14 DIAGNOSIS — Z801 Family history of malignant neoplasm of trachea, bronchus and lung: Secondary | ICD-10-CM | POA: Diagnosis not present

## 2017-09-14 DIAGNOSIS — N281 Cyst of kidney, acquired: Secondary | ICD-10-CM | POA: Diagnosis present

## 2017-09-14 DIAGNOSIS — Z01810 Encounter for preprocedural cardiovascular examination: Secondary | ICD-10-CM | POA: Diagnosis not present

## 2017-09-14 DIAGNOSIS — Z7982 Long term (current) use of aspirin: Secondary | ICD-10-CM

## 2017-09-14 DIAGNOSIS — Z794 Long term (current) use of insulin: Secondary | ICD-10-CM

## 2017-09-14 DIAGNOSIS — J449 Chronic obstructive pulmonary disease, unspecified: Secondary | ICD-10-CM | POA: Diagnosis present

## 2017-09-14 DIAGNOSIS — E1122 Type 2 diabetes mellitus with diabetic chronic kidney disease: Secondary | ICD-10-CM | POA: Diagnosis present

## 2017-09-14 DIAGNOSIS — E785 Hyperlipidemia, unspecified: Secondary | ICD-10-CM | POA: Diagnosis present

## 2017-09-14 DIAGNOSIS — N183 Chronic kidney disease, stage 3 unspecified: Secondary | ICD-10-CM | POA: Insufficient documentation

## 2017-09-14 DIAGNOSIS — N2581 Secondary hyperparathyroidism of renal origin: Secondary | ICD-10-CM | POA: Diagnosis not present

## 2017-09-14 DIAGNOSIS — Z951 Presence of aortocoronary bypass graft: Secondary | ICD-10-CM | POA: Diagnosis not present

## 2017-09-14 DIAGNOSIS — F419 Anxiety disorder, unspecified: Secondary | ICD-10-CM | POA: Diagnosis present

## 2017-09-14 DIAGNOSIS — I480 Paroxysmal atrial fibrillation: Secondary | ICD-10-CM | POA: Diagnosis present

## 2017-09-14 DIAGNOSIS — N184 Chronic kidney disease, stage 4 (severe): Secondary | ICD-10-CM | POA: Diagnosis present

## 2017-09-14 DIAGNOSIS — R197 Diarrhea, unspecified: Secondary | ICD-10-CM | POA: Diagnosis present

## 2017-09-14 DIAGNOSIS — Z79899 Other long term (current) drug therapy: Secondary | ICD-10-CM

## 2017-09-14 DIAGNOSIS — I214 Non-ST elevation (NSTEMI) myocardial infarction: Principal | ICD-10-CM | POA: Diagnosis present

## 2017-09-14 HISTORY — DX: Obesity, unspecified: E66.9

## 2017-09-14 HISTORY — DX: Atherosclerotic heart disease of native coronary artery without angina pectoris: I25.10

## 2017-09-14 HISTORY — DX: Chronic obstructive pulmonary disease, unspecified: J44.9

## 2017-09-14 HISTORY — DX: Chronic kidney disease, unspecified: N18.9

## 2017-09-14 HISTORY — DX: Nonrheumatic aortic (valve) stenosis: I35.0

## 2017-09-14 HISTORY — DX: Hyperlipidemia, unspecified: E78.5

## 2017-09-14 LAB — CBC WITH DIFFERENTIAL/PLATELET
BASOS ABS: 0.1 10*3/uL (ref 0.0–0.1)
BASOS PCT: 1 %
Eosinophils Absolute: 0.4 10*3/uL (ref 0.0–0.7)
Eosinophils Relative: 6 %
HCT: 48.2 % (ref 39.0–52.0)
Hemoglobin: 16.4 g/dL (ref 13.0–17.0)
Lymphocytes Relative: 29 %
Lymphs Abs: 2 10*3/uL (ref 0.7–4.0)
MCH: 29.2 pg (ref 26.0–34.0)
MCHC: 34 g/dL (ref 30.0–36.0)
MCV: 85.8 fL (ref 78.0–100.0)
MONOS PCT: 8 %
Monocytes Absolute: 0.5 10*3/uL (ref 0.1–1.0)
Neutro Abs: 4 10*3/uL (ref 1.7–7.7)
Neutrophils Relative %: 58 %
Platelets: 132 10*3/uL — ABNORMAL LOW (ref 150–400)
RBC: 5.62 MIL/uL (ref 4.22–5.81)
RDW: 14.2 % (ref 11.5–15.5)
WBC: 7 10*3/uL (ref 4.0–10.5)

## 2017-09-14 LAB — COMPREHENSIVE METABOLIC PANEL
ALT: 17 U/L (ref 17–63)
AST: 14 U/L — ABNORMAL LOW (ref 15–41)
Albumin: 3.5 g/dL (ref 3.5–5.0)
Alkaline Phosphatase: 92 U/L (ref 38–126)
Anion gap: 7 (ref 5–15)
BUN: 36 mg/dL — AB (ref 6–20)
CHLORIDE: 104 mmol/L (ref 101–111)
CO2: 26 mmol/L (ref 22–32)
Calcium: 9 mg/dL (ref 8.9–10.3)
Creatinine, Ser: 2.41 mg/dL — ABNORMAL HIGH (ref 0.61–1.24)
GFR calc Af Amer: 28 mL/min — ABNORMAL LOW (ref >60)
GFR calc non Af Amer: 24 mL/min — ABNORMAL LOW (ref >60)
Glucose, Bld: 351 mg/dL — ABNORMAL HIGH (ref 65–99)
Potassium: 4.3 mmol/L (ref 3.5–5.1)
SODIUM: 137 mmol/L (ref 135–145)
Total Bilirubin: 0.5 mg/dL (ref 0.3–1.2)
Total Protein: 7.2 g/dL (ref 6.5–8.1)

## 2017-09-14 LAB — I-STAT TROPONIN, ED: Troponin i, poc: 0.03 ng/mL (ref 0.00–0.08)

## 2017-09-14 LAB — BRAIN NATRIURETIC PEPTIDE: B NATRIURETIC PEPTIDE 5: 231.8 pg/mL — AB (ref 0.0–100.0)

## 2017-09-14 LAB — TSH: TSH: 3.159 u[IU]/mL (ref 0.350–4.500)

## 2017-09-14 LAB — TRIGLYCERIDES: TRIGLYCERIDES: 155 mg/dL — AB (ref <150)

## 2017-09-14 LAB — PROTIME-INR
INR: 1.07
Prothrombin Time: 13.8 seconds (ref 11.4–15.2)

## 2017-09-14 LAB — CBG MONITORING, ED: Glucose-Capillary: 331 mg/dL — ABNORMAL HIGH (ref 65–99)

## 2017-09-14 LAB — TROPONIN I: Troponin I: 0.05 ng/mL (ref <0.03)

## 2017-09-14 MED ORDER — NITROGLYCERIN 0.4 MG SL SUBL
0.4000 mg | SUBLINGUAL_TABLET | SUBLINGUAL | Status: DC | PRN
  Administered 2017-09-17 – 2017-09-22 (×6): 0.4 mg via SUBLINGUAL
  Filled 2017-09-14 (×6): qty 1

## 2017-09-14 MED ORDER — INSULIN ASPART 100 UNIT/ML ~~LOC~~ SOLN
28.0000 [IU] | Freq: Two times a day (BID) | SUBCUTANEOUS | Status: DC
Start: 2017-09-15 — End: 2017-09-17
  Administered 2017-09-15 – 2017-09-16 (×2): 28 [IU] via SUBCUTANEOUS
  Filled 2017-09-14: qty 1

## 2017-09-14 MED ORDER — ONDANSETRON HCL 4 MG/2ML IJ SOLN
4.0000 mg | Freq: Four times a day (QID) | INTRAMUSCULAR | Status: DC | PRN
  Administered 2017-09-15 – 2017-09-20 (×5): 4 mg via INTRAVENOUS
  Filled 2017-09-14 (×5): qty 2

## 2017-09-14 MED ORDER — FUROSEMIDE 10 MG/ML IJ SOLN
40.0000 mg | Freq: Every day | INTRAMUSCULAR | Status: DC
  Administered 2017-09-15: 40 mg via INTRAVENOUS
  Filled 2017-09-14: qty 4

## 2017-09-14 MED ORDER — METOPROLOL SUCCINATE ER 50 MG PO TB24
50.0000 mg | ORAL_TABLET | Freq: Every day | ORAL | Status: DC
  Administered 2017-09-14 – 2017-09-19 (×6): 50 mg via ORAL
  Filled 2017-09-14 (×9): qty 1

## 2017-09-14 MED ORDER — FENTANYL CITRATE (PF) 100 MCG/2ML IJ SOLN: 50.0000 ug | INTRAMUSCULAR | Status: DC | PRN

## 2017-09-14 MED ORDER — PROPOFOL 1000 MG/100ML IV EMUL: 0.0000 ug/kg/min | INTRAVENOUS | Status: DC

## 2017-09-14 MED ORDER — NITROGLYCERIN IN D5W 200-5 MCG/ML-% IV SOLN: 0.0000 ug/min | Freq: Once | INTRAVENOUS | Status: DC

## 2017-09-14 MED ORDER — ZOLPIDEM TARTRATE 5 MG PO TABS
5.0000 mg | ORAL_TABLET | Freq: Every evening | ORAL | Status: DC | PRN
  Administered 2017-09-18: 5 mg via ORAL
  Filled 2017-09-14: qty 1

## 2017-09-14 MED ORDER — MIDAZOLAM HCL 2 MG/2ML IJ SOLN: 1.0000 mg | INTRAMUSCULAR | Status: DC | PRN

## 2017-09-14 MED ORDER — HEPARIN (PORCINE) IN NACL 100-0.45 UNIT/ML-% IJ SOLN
1700.0000 [IU]/h | INTRAMUSCULAR | Status: DC
  Administered 2017-09-14: 1200 [IU]/h via INTRAVENOUS
  Administered 2017-09-16 – 2017-09-17 (×2): 1400 [IU]/h via INTRAVENOUS
  Administered 2017-09-19 – 2017-09-22 (×6): 1700 [IU]/h via INTRAVENOUS
  Filled 2017-09-14 (×12): qty 250

## 2017-09-14 MED ORDER — ACETAMINOPHEN 325 MG PO TABS
650.0000 mg | ORAL_TABLET | ORAL | Status: DC | PRN
  Administered 2017-09-17: 650 mg via ORAL
  Filled 2017-09-14: qty 2

## 2017-09-14 MED ORDER — HEPARIN BOLUS VIA INFUSION
4000.0000 [IU] | Freq: Once | INTRAVENOUS | Status: AC
  Administered 2017-09-14: 4000 [IU] via INTRAVENOUS
  Filled 2017-09-14: qty 4000

## 2017-09-14 MED ORDER — INSULIN ASPART 100 UNIT/ML ~~LOC~~ SOLN
0.0000 [IU] | Freq: Three times a day (TID) | SUBCUTANEOUS | Status: DC
  Administered 2017-09-15 (×2): 7 [IU] via SUBCUTANEOUS
  Administered 2017-09-16: 8 [IU] via SUBCUTANEOUS
  Administered 2017-09-16: 4 [IU] via SUBCUTANEOUS
  Administered 2017-09-16: 11 [IU] via SUBCUTANEOUS
  Administered 2017-09-17: 7 [IU] via SUBCUTANEOUS
  Administered 2017-09-17 – 2017-09-19 (×3): 4 [IU] via SUBCUTANEOUS
  Administered 2017-09-19: 3 [IU] via SUBCUTANEOUS
  Administered 2017-09-21: 4 [IU] via SUBCUTANEOUS
  Administered 2017-09-21: 7 [IU] via SUBCUTANEOUS
  Administered 2017-09-21: 4 [IU] via SUBCUTANEOUS
  Administered 2017-09-22 (×2): 7 [IU] via SUBCUTANEOUS
  Administered 2017-09-22: 3 [IU] via SUBCUTANEOUS
  Administered 2017-09-23: 4 [IU] via SUBCUTANEOUS
  Administered 2017-09-23 – 2017-09-24 (×5): 3 [IU] via SUBCUTANEOUS
  Filled 2017-09-14 (×2): qty 1

## 2017-09-14 MED ORDER — NITROGLYCERIN IN D5W 200-5 MCG/ML-% IV SOLN
0.0000 ug/min | Freq: Once | INTRAVENOUS | Status: AC
Start: 2017-09-14 — End: 2017-09-15
  Administered 2017-09-14: 5 ug/min via INTRAVENOUS
  Filled 2017-09-14: qty 250

## 2017-09-14 MED ORDER — INSULIN NPH (HUMAN) (ISOPHANE) 100 UNIT/ML ~~LOC~~ SUSP
40.0000 [IU] | Freq: Two times a day (BID) | SUBCUTANEOUS | Status: DC
  Filled 2017-09-14 (×3): qty 10

## 2017-09-14 MED ORDER — FUROSEMIDE 10 MG/ML IJ SOLN
40.0000 mg | Freq: Once | INTRAMUSCULAR | Status: AC
  Administered 2017-09-14: 40 mg via INTRAVENOUS
  Filled 2017-09-14: qty 4

## 2017-09-14 MED ORDER — ASPIRIN EC 81 MG PO TBEC
81.0000 mg | DELAYED_RELEASE_TABLET | Freq: Every day | ORAL | Status: DC
  Administered 2017-09-15 – 2017-09-19 (×5): 81 mg via ORAL
  Filled 2017-09-14 (×5): qty 1

## 2017-09-14 NOTE — ED Notes (Signed)
Date and time results received: 09/14/17  @ 19:40  Reported by Hart Carwin Spikes  Test: Troponin Critical Value: .05  Name of Provider Notified: Alvino Chapel  Orders Received? Or Actions Taken?: [place] notified

## 2017-09-14 NOTE — ED Provider Notes (Signed)
[facility] EMERGENCY DEPARTMENT Provider Note   CSN: [id number] Arrival date & time: 09/14/17  1557     History   Chief Complaint Chief Complaint  Patient presents with  . Chest Pain    HPI Cameron [name] is a 76 y.o. male.  HPI Patient presents with chest pain.  Began today around 130.  It is on his left chest.  Has had heart bypass.  Has seen cardiology recently and had some changes medications.  Started on Imdur.  States he took 2 of those at home.  Also took aspirin at home.  Pain been somewhat constant.  Initially code STEMI had been called by EMS but canceled prehospital.  Pain much improved now.  Had nitroglycerin and morphine prehospital.  Has planned to have heart catheterization in just over a week.  No fevers.  Has had a little bit of a cough.  No real sputum production. Past Medical History:  Diagnosis Date  . A-fib ([place])   . Diabetes mellitus without complication ([place])   . Hypertension     Patient Active Problem List   Diagnosis Date Noted  . Diabetes mellitus, insulin dependent (IDDM), uncontrolled ([place]) 09/14/2017  . Unstable angina ([place]) 09/14/2017  . Upper airway cough syndrome 12/13/2013  . HBP (high blood pressure) 12/13/2013  . COPD mixed type ([place]) 12/11/2013    Past Surgical History:  Procedure Laterality Date  . CARDIAC SURGERY    . FINGER AMPUTATION    . HERNIA REPAIR    . open heart surgery    . TOE AMPUTATION         Home Medications    Prior to Admission medications   Medication Sig Start Date End Date Taking? Authorizing Provider  aspirin 325 MG tablet Take 325 mg daily after supper by mouth.    Yes [provider]  insulin NPH (HUMULIN N,NOVOLIN N) 100 UNIT/ML injection Inject 50 Units See admin instructions into the skin. Inject 50 unit subcutaneously twice daily - after lunch and supper   Yes [provider]  insulin regular (NOVOLIN R,HUMULIN R) 100 units/mL injection Inject 35 Units See admin  instructions into the skin. Inject 35 units subcutaneously twice daily - after lunch and supper   Yes [provider]  isosorbide mononitrate (IMDUR) 30 MG 24 hr tablet Take 30 mg See admin instructions by mouth. Take 1 tablet (30 mg) by mouth every morning, may also take a 2nd dose (30 mg) as needed for chest pain   Yes [provider]  metoprolol succinate (TOPROL-XL) 50 MG 24 hr tablet Take 50 mg daily after supper by mouth. Take with or immediately following a meal.    Yes [provider]  nitroGLYCERIN (NITROSTAT) 0.4 MG SL tablet Place 0.4 mg every 5 (five) minutes as needed under the tongue for chest pain.   Yes [provider]  rosuvastatin (CRESTOR) 40 MG tablet Take 40 mg daily after supper by mouth.    Yes [provider]    Family History Family History  Problem Relation Age of Onset  . Heart disease Father   . Stomach cancer Paternal Grandfather   . Lung cancer Maternal Grandmother        smoked    Social History Social History   Tobacco Use  . Smoking status: Former Smoker    Packs/day: 2.00    Years: 20.00    Pack years: 40.00    Types: Cigarettes    Last attempt to quit:  09/28/1978    Years since quitting: 38.9  . Smokeless tobacco: Never Used  Substance Use Topics  . Alcohol use: No  . Drug use: No     Allergies   Cefuroxime; Ezetimibe; and Pravastatin   Review of Systems Review of Systems  Constitutional: Negative for appetite change.  HENT: Negative for congestion.   Respiratory: Positive for cough and shortness of breath.   Cardiovascular: Positive for chest pain.  Gastrointestinal: Negative for abdominal pain.  Endocrine: Negative for polyuria.  Genitourinary: Negative for flank pain.  Musculoskeletal: Negative for back pain.  Neurological: Negative for weakness and numbness.  Hematological: Negative for adenopathy.  Psychiatric/Behavioral: The patient is nervous/anxious.      Physical Exam Updated  Vital Signs BP (!) 155/73   Pulse 93   Temp 98 F (36.7 C) (Oral)   Resp (!) 25   Ht 5\' 9"  (1.753 m)   Wt 99.8 kg (220 lb)   SpO2 91%   BMI 32.49 kg/m   Physical Exam  Constitutional: He appears well-developed.  HENT:  Head: Atraumatic.  Cardiovascular: Normal rate, regular rhythm and normal pulses.  Pulmonary/Chest: Effort normal.  Mild diffuse wheezes without prolonged expirations  Musculoskeletal:  Mild edema bilateral lower extremities  Neurological: He is alert.  Skin: Capillary refill takes less than 2 seconds.     ED Treatments / Results  Labs (all labs ordered are listed, but only abnormal results are displayed) Labs Reviewed  COMPREHENSIVE METABOLIC PANEL - Abnormal; Notable for the following components:      Result Value   Glucose, Bld 351 (*)    BUN 36 (*)    Creatinine, Ser 2.41 (*)    AST 14 (*)    GFR calc non Af Amer 24 (*)    GFR calc Af Amer 28 (*)    All other components within normal limits  CBC WITH DIFFERENTIAL/PLATELET - Abnormal; Notable for the following components:   Platelets 132 (*)    All other components within normal limits  TROPONIN I - Abnormal; Notable for the following components:   Troponin I 0.05 (*)    All other components within normal limits  BRAIN NATRIURETIC PEPTIDE - Abnormal; Notable for the following components:   B Natriuretic Peptide 231.8 (*)    All other components within normal limits  CBG MONITORING, ED - Abnormal; Notable for the following components:   Glucose-Capillary 331 (*)    All other components within normal limits  PROTIME-INR  TSH  TROPONIN I  TROPONIN I  LIPID PANEL  HEPARIN LEVEL (UNFRACTIONATED)  CBC  COMPREHENSIVE METABOLIC PANEL  BRAIN NATRIURETIC PEPTIDE  I-STAT TROPONIN, ED    EKG  EKG Interpretation  Date/Time:  Saturday September 14 2017 16:22:01 EST Ventricular Rate:  104 PR Interval:    QRS Duration: 151 QT Interval:  383 QTC Calculation: 504 R Axis:   -87 Text  Interpretation:  Sinus tachycardia Probable left atrial enlargement Right bundle branch block Consider left ventricular hypertrophy Confirmed by Davonna Belling [phone]) on 09/14/2017 8:11:14 PM       Radiology Dg Chest Portable 1 View  Result Date: 09/14/2017 CLINICAL DATA:  Chest pain EXAM: PORTABLE CHEST 1 VIEW COMPARISON:  12/26/2013 FINDINGS: Cardiomegaly with interstitial coarsening diffusely. Vascular pedicle widening. Status post CABG. Remote right rib fractures. No effusion or pneumothorax. IMPRESSION: CHF pattern, mild. Electronically Signed   By: Monte Fantasia M.D.   On: 09/14/2017 16:45    Procedures Procedures (including critical care time)  Medications Ordered in  ED Medications  aspirin EC tablet 81 mg (not administered)  nitroGLYCERIN (NITROSTAT) SL tablet 0.4 mg (not administered)  acetaminophen (TYLENOL) tablet 650 mg (not administered)  ondansetron (ZOFRAN) injection 4 mg (not administered)  zolpidem (AMBIEN) tablet 5 mg (not administered)  metoprolol succinate (TOPROL-XL) 24 hr tablet 50 mg (50 mg Oral Given 09/14/17 1858)  heparin ADULT infusion 100 units/mL (25000 units/261mL sodium chloride 0.45%) (1,200 Units/hr Intravenous New Bag/Given 09/14/17 1900)  furosemide (LASIX) injection 40 mg (not administered)  insulin NPH Human (HUMULIN N,NOVOLIN N) injection 40 Units (not administered)  insulin aspart (novoLOG) injection 28 Units (not administered)  insulin aspart (novoLOG) injection 0-20 Units (not administered)  nitroGLYCERIN 50 mg in dextrose 5 % 250 mL (0.2 mg/mL) infusion (10 mcg/min Intravenous Rate/Dose Change 09/14/17 1859)  furosemide (LASIX) injection 40 mg (40 mg Intravenous Given 09/14/17 1826)  heparin bolus via infusion 4,000 Units (4,000 Units Intravenous Bolus from Bag 09/14/17 1904)     Initial Impression / Assessment and Plan / ED Course  I have reviewed the triage vital signs and the nursing notes.  Pertinent labs & imaging results that  were available during my care of the patient were reviewed by me and considered in my medical decision making (see chart for details).     Patient with chest pain.  EKG stable.  Initial troponin negative.  However is moderately hypertensive to start with with pressure of 213/113.  Seen in the ER by Dr. Einar Gip.  Will admit to hospital with medicine consult for his hyperglycemia.  Likely will go to the Cath Lab on Monday.  Final Clinical Impressions(s) / ED Diagnoses   Final diagnoses:  Unstable angina [facility])    ED Discharge Orders    None       Davonna Belling, MD 09/14/17 2011

## 2017-09-14 NOTE — ED Notes (Signed)
Per EDP start Nitro at 5 r/t pt being CP free and BP in 140's

## 2017-09-14 NOTE — H&P (Signed)
Cameron [name] is an 76 y.o. male.   Chief Complaint: Chest pain HPI: Patient with history of coronary artery disease and CABG in 2000, history of stage III chronic kidney disease, hypertension, uncontrolled diabetes mellitus, who has been complaining about chest pain that has been ongoing for the past month or so, recently has been getting worse.  He was seen by [place] on 09/12/2017 with chest pain suggestive of progressive angina pectoris, he was scheduled for outpatient stress test and also scheduled for cardiac catheterization at the same time in view of renal failure to improve diagnostic yield.  He has history of prior tobacco use disorder, COPD, hypertension, hyperlipidemia.  He had been doing well until past few months, has noticed occasional episodes of sharp pain to heaviness in the left upper part of the chest however in the past 2 days she has had more frequent episodes.  He did take sublingual nitroglycerin and felt well yesterday but this morning when he woke up felt very weak and tired, also started to have continuous chest discomfort, his wife called the EMS.  He was transported to the emergency room after the STEMI was canceled.  After arrival to the emergency room, he has started to feel better he is not having any active chest pain.  He has chronic shortness of breath and also chronic orthopnea and sleeps on a recliner.  Denies hemoptysis but states that he has chronic cough.  Denies leg edema, states that if he lays down flat, he does feel difficulty breathing and also just seems to hurt even more.  His wife is present at the bedside.  He was hypertensive and initially presented to the emergency room.  Past Medical History:  Diagnosis Date  . A-fib ([place])   . Diabetes mellitus without complication ([place])   . Hypertension     Past Surgical History:  Procedure Laterality Date  . CARDIAC SURGERY    . FINGER AMPUTATION    . HERNIA REPAIR    . open heart surgery    . TOE AMPUTATION       Family History  Problem Relation Age of Onset  . Heart disease Father   . Stomach cancer Paternal Grandfather   . Lung cancer Maternal Grandmother        smoked   Social History:  reports that he quit smoking about 38 years ago. His smoking use included cigarettes. He has a 40.00 pack-year smoking history. he has never used smokeless tobacco. He reports that he does not drink alcohol or use drugs.  Allergies:  Allergies  Allergen Reactions  . Amoxicillin Other (See Comments)    Diarrhea  Has patient had a PCN reaction causing immediate rash, facial/tongue/throat swelling, SOB or lightheadedness with hypotension:no Has patient had a PCN reaction causing severe rash involving mucus membranes or skin necrosis: no Has patient had a PCN reaction that required hospitalization: no Has patient had a PCN reaction occurring within the last 10 years: no If all of the above answers are "NO", then may proceed with Cephalosporin use.   . Cefuroxime Diarrhea  . Ezetimibe Diarrhea  . Pravastatin Other (See Comments)    myalgias     (Not in a hospital admission)  Results for orders placed or performed during the hospital encounter of 09/14/17 (from the past 48 hour(s))  Comprehensive metabolic panel     Status: Abnormal   Collection Time: 09/14/17  4:18 PM  Result Value Ref Range   Sodium 137 135 - 145 mmol/L  Potassium 4.3 3.5 - 5.1 mmol/L   Chloride 104 101 - 111 mmol/L   CO2 26 22 - 32 mmol/L   Glucose, Bld 351 (H) 65 - 99 mg/dL   BUN 36 (H) 6 - 20 mg/dL   Creatinine, Ser 2.41 (H) 0.61 - 1.24 mg/dL   Calcium 9.0 8.9 - 10.3 mg/dL   Total Protein 7.2 6.5 - 8.1 g/dL   Albumin 3.5 3.5 - 5.0 g/dL   AST 14 (L) 15 - 41 U/L   ALT 17 17 - 63 U/L   Alkaline Phosphatase 92 38 - 126 U/L   Total Bilirubin 0.5 0.3 - 1.2 mg/dL   GFR calc non Af Amer 24 (L) >60 mL/min   GFR calc Af Amer 28 (L) >60 mL/min    Comment: (NOTE) The eGFR has been calculated using the CKD EPI equation. This  calculation has not been validated in all clinical situations. eGFR's persistently <60 mL/min signify possible Chronic Kidney Disease.    Anion gap 7 5 - 15  CBC with Differential     Status: Abnormal   Collection Time: 09/14/17  4:18 PM  Result Value Ref Range   WBC 7.0 4.0 - 10.5 K/uL   RBC 5.62 4.22 - 5.81 MIL/uL   Hemoglobin 16.4 13.0 - 17.0 g/dL   HCT 48.2 39.0 - 52.0 %   MCV 85.8 78.0 - 100.0 fL   MCH 29.2 26.0 - 34.0 pg   MCHC 34.0 30.0 - 36.0 g/dL   RDW 14.2 11.5 - 15.5 %   Platelets 132 (L) 150 - 400 K/uL   Neutrophils Relative % 58 %   Neutro Abs 4.0 1.7 - 7.7 K/uL   Lymphocytes Relative 29 %   Lymphs Abs 2.0 0.7 - 4.0 K/uL   Monocytes Relative 8 %   Monocytes Absolute 0.5 0.1 - 1.0 K/uL   Eosinophils Relative 6 %   Eosinophils Absolute 0.4 0.0 - 0.7 K/uL   Basophils Relative 1 %   Basophils Absolute 0.1 0.0 - 0.1 K/uL  Protime-INR     Status: None   Collection Time: 09/14/17  4:18 PM  Result Value Ref Range   Prothrombin Time 13.8 11.4 - 15.2 seconds   INR 1.07   CBG monitoring, ED     Status: Abnormal   Collection Time: 09/14/17  4:25 PM  Result Value Ref Range   Glucose-Capillary 331 (H) 65 - 99 mg/dL  I-stat troponin, ED     Status: None   Collection Time: 09/14/17  4:36 PM  Result Value Ref Range   Troponin i, poc 0.03 0.00 - 0.08 ng/mL   Comment 3            Comment: Due to the release kinetics of cTnI, a negative result within the first hours of the onset of symptoms does not rule out myocardial infarction with certainty. If myocardial infarction is still suspected, repeat the test at appropriate intervals.    Dg Chest Portable 1 View  Result Date: 09/14/2017 CLINICAL DATA:  Chest pain EXAM: PORTABLE CHEST 1 VIEW COMPARISON:  12/26/2013 FINDINGS: Cardiomegaly with interstitial coarsening diffusely. Vascular pedicle widening. Status post CABG. Remote right rib fractures. No effusion or pneumothorax. IMPRESSION: CHF pattern, mild. Electronically  Signed   By: Monte Fantasia M.D.   On: 09/14/2017 16:45    Review of Systems  Constitutional: Negative for fever and weight loss.  HENT: Negative for hearing loss.   Eyes: Negative.   Respiratory: Positive for cough and sputum production.  Negative for hemoptysis.   Cardiovascular: Positive for chest pain and orthopnea. Negative for palpitations, claudication, leg swelling and PND.  Gastrointestinal: Negative.   Genitourinary: Negative.   Musculoskeletal: Positive for joint pain.  Skin: Negative for rash.  Neurological: Negative for dizziness, tremors, sensory change and headaches.  Endo/Heme/Allergies: Negative for environmental allergies. Does not bruise/bleed easily.  Psychiatric/Behavioral: Negative for depression and suicidal ideas.  All other systems reviewed and are negative.   Blood pressure (!) 154/69, pulse 87, temperature 98 F (36.7 C), temperature source Oral, resp. rate (!) 23, height '5\' 9"'  (1.753 m), weight 99.8 kg (220 lb), SpO2 93 %. Physical Exam  Constitutional: He is oriented to person, place, and time.  Well developed, mild respiratory distress, obese.   HENT:  Head: Normocephalic and atraumatic.  Eyes: Conjunctivae and EOM are normal.  Neck: JVD present.  Cardiovascular:  Distant heart sounds. S1 appears muffled, pansystolic murmur in the apex. No gallop. Normal S2.   Vascular exam: Soft bilateral carotid bruit present. Femoral pulse difficult to feel due to obesity. Left popliteal normal and right faint. Absent pedal pulses bilateral  Respiratory: No stridor. He has wheezes (bilater with fine bibasilar crackles).  GI: Soft.  Ventral hernia noted. BS present in all 4 quadrants  Musculoskeletal: Normal range of motion. He exhibits no edema.  Lymphadenopathy:    He has no cervical adenopathy.  Neurological: He is alert and oriented to person, place, and time.  Skin: Skin is warm and dry.    EKG 09/14/2017: Sinus tachycardia at rate of 105 bpm, left atrial  enlargement, left axis deviation, left anterior fascicular block.  Inferior infarct old.  Right bundle branch block.  LVH with repolarization abnormality, cannot exclude inferior and lateral ischemia.  Abnormal EKG.   Assessment/Plan 1.  Acute coronary syndrome, patient with nitrate responsive chest pain, states that it is similar to his angina pectoris prior to coronary artery disease and CABG. 2.  CAD S/P CABG in 2002, complete graft report not available. 3.  Shortness of breath due to underlying COPD, patient also in acute probably diastolic heart failure on chronic diastolic heart failure.  Echocardiogram pending. 4.  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  Faint bilateral carotid bruit 6.  Hyperlipidemia 7.  Thrombocytopenia  Recommendation: Extremely high risk individual who is presenting with chest pain, initial set of troponin is negative, patient will be admitted to stepdown unit and cardiac markers will be followed serially.  He will need gentle diuresis as he is in congestive heart failure, he will eventually need coronary angiography, probably will set him up for Monday and try to avoid nephrotoxic agents and will not start him on ACE inhibitors due to renal failure.  We will continue to trend his thrombocytopenia.  We will have hospitalist assist me in medical care specifically diabetes management.  Wife is present at the bedside and all questions answered.  Adrian Prows, MD 09/14/2017, 5:55 PM

## 2017-09-14 NOTE — Progress Notes (Signed)
ANTICOAGULATION CONSULT NOTE - Initial Consult  Pharmacy Consult for heparin Indication: chest pain/ACS  Allergies  Allergen Reactions  . Amoxicillin Other (See Comments)    Diarrhea  Has patient had a PCN reaction causing immediate rash, facial/tongue/throat swelling, SOB or lightheadedness with hypotension:no Has patient had a PCN reaction causing severe rash involving mucus membranes or skin necrosis: no Has patient had a PCN reaction that required hospitalization: no Has patient had a PCN reaction occurring within the last 10 years: no If all of the above answers are "NO", then may proceed with Cephalosporin use.   . Cefuroxime Diarrhea  . Ezetimibe Diarrhea  . Pravastatin Other (See Comments)    myalgias    Patient Measurements: Height: 5\' 9"  (175.3 cm) Weight: 220 lb (99.8 kg) IBW/kg (Calculated) : 70.7 Heparin Dosing Weight: 91.8kg  Vital Signs: Temp: 98 F (36.7 C) (11/17 1610) Temp Source: Oral (11/17 1610) BP: 154/69 (11/17 1715) Pulse Rate: 87 (11/17 1715)  Labs: Recent Labs    09/14/17 1618  HGB 16.4  HCT 48.2  PLT 132*  LABPROT 13.8  INR 1.07  CREATININE 2.41*    Estimated Creatinine Clearance: 29.9 mL/min (A) (by C-G formula based on SCr of 2.41 mg/dL (H)).   Medical History: Past Medical History:  Diagnosis Date  . A-fib ([place])   . Diabetes mellitus without complication ([place])   . Hypertension     Medications:  Infusions:  . heparin    . nitroGLYCERIN      Assessment: 8 yom presented to the ED with chest pain. To start IV heparin. Baseline H/H is WNL but platelets are slightly low. He is not on anticoagulation PTA.  Goal of Therapy:  Heparin level 0.3-0.7 units/ml Monitor platelets by anticoagulation protocol: Yes   Plan:  Heparin bolus 4000 units IV x 1 Heparin gtt 1200 units/hr Check an 8 hr heparin level Daily heparin level and CBC  Teffany Blaszczyk, Rande Lawman 09/14/2017,5:56 PM

## 2017-09-14 NOTE — Consult Note (Signed)
Medical Consultation   Cameron [name]  JME:[id number]  DOB: 30-Nov-1940  DOA: 09/14/2017  PCP: Parke Poisson, MD   Outpatient Specialists:  Dr. Einar Gip (Cardiology) Dr. Melvyn Novas (Pulmonology)   Requesting physician: Dr. Alvino Chapel  Reason for consultation: Diabetes management   History of Present Illness: Cameron [name] is an 76 y.o. male with a history of CAD status post CABG, diabetes mellitus, chronic cough syndrome.  On Wednesday, November 14, he reports having some similar chest pain and was seen by his primary care physician.  His primary care physician felt like his chest pain was cardiogenic, referred him to the cardiologist.  On Thursday, November 15, he was seen by Dr. Irven Shelling colleague with plans for heart catheterization after Thanksgiving. Today, however, patient experience recurrent substernal pressure-like chest pain. He reports taking Imdur twice which helped with his pain and recommended to seek medical attention at the emergency department by his wife. He was given nitroglycerin en route to the emergency department by EMS which also helped.  He reports currently being chest pain-free.     Review of Systems:  Review of Systems  Constitutional: Negative for chills, diaphoresis and fever.  Respiratory: Positive for cough. Negative for hemoptysis, sputum production, shortness of breath and wheezing.   Cardiovascular: Positive for chest pain. Negative for palpitations, orthopnea, claudication, leg swelling and PND.  Gastrointestinal: Negative for abdominal pain, blood in stool, constipation, diarrhea, melena, nausea and vomiting.  Genitourinary: Negative for dysuria.  Neurological: Negative for dizziness and loss of consciousness.  All other systems reviewed and are negative.     Past Medical History: Past Medical History:  Diagnosis Date  . A-fib ([place])   . Diabetes mellitus without complication ([place])   . Hypertension     Past Surgical  History: Past Surgical History:  Procedure Laterality Date  . CARDIAC SURGERY    . HERNIA REPAIR    . open heart surgery       Allergies:   Allergies  Allergen Reactions  . Amoxicillin Other (See Comments)    Diarrhea  Has patient had a PCN reaction causing immediate rash, facial/tongue/throat swelling, SOB or lightheadedness with hypotension:no Has patient had a PCN reaction causing severe rash involving mucus membranes or skin necrosis: no Has patient had a PCN reaction that required hospitalization: no Has patient had a PCN reaction occurring within the last 10 years: no If all of the above answers are "NO", then may proceed with Cephalosporin use.   . Cefuroxime Diarrhea  . Ezetimibe Diarrhea  . Pravastatin Other (See Comments)    myalgias     Social History:  reports that he quit smoking about 38 years ago. His smoking use included cigarettes. He has a 40.00 pack-year smoking history. he has never used smokeless tobacco. He reports that he does not drink alcohol or use drugs.   Family History: Family History  Problem Relation Age of Onset  . Heart disease Father   . Stomach cancer Paternal Grandfather   . Lung cancer Maternal Grandmother        smoked     Physical Exam: Vitals:   09/14/17 1604 09/14/17 1610 09/14/17 1611  BP:  (!) 213/113   Pulse:  (!) 105   Resp:  (!) 28   Temp:  98 F (36.7 C)   TempSrc:  Oral   SpO2: 97% 92%   Weight:   99.8 kg (220 lb)  Height:  5\' 9"  (1.753 m)   Physical Exam  Constitutional: He is oriented to person, place, and time. He appears well-developed and well-nourished.  Non-toxic appearance. He does not appear ill.  HENT:  Head: Normocephalic.  Eyes: EOM are normal. Pupils are equal, round, and reactive to light.  Neck: Normal range of motion. Neck supple. No JVD present. No thyromegaly present.  Cardiovascular: Normal rate, regular rhythm and normal pulses.  Murmur heard.  Systolic murmur is present with a grade of  2/6. Pulmonary/Chest: Breath sounds normal. No respiratory distress. He has no decreased breath sounds. He has no wheezes. He has no rhonchi. He has no rales.  Abdominal: Soft. Bowel sounds are normal. He exhibits distension. He exhibits no mass. There is no tenderness. There is no rebound and no guarding.  Musculoskeletal: Normal range of motion.       Right hand: He exhibits deformity (amputed 5th digit).       Right lower leg: He exhibits no edema.       Left lower leg: He exhibits no edema.       Right foot: There is deformity (amputed 4th digit).  Neurological: He is alert and oriented to person, place, and time.  Skin: Skin is warm and dry.  Left shin healing ulcerated lesion without surrounding erythema or tenderness  Vitals reviewed.    Data reviewed:  I have personally reviewed following labs and imaging studies Labs:   CBG: Recent Labs  Lab 09/14/17 1625  GLUCAP 331*   Urinalysis    Component Value Date/Time   COLORURINE YELLOW 02/22/2013 [place] 02/22/2013 1318   LABSPEC 1.028 02/22/2013 1318   PHURINE 5.5 02/22/2013 1318   GLUCOSEU >1000 (A) 02/22/2013 1318   HGBUR SMALL (A) 02/22/2013 1318   BILIRUBINUR NEGATIVE 02/22/2013 1318   KETONESUR NEGATIVE 02/22/2013 1318   PROTEINUR 100 (A) 02/22/2013 1318   UROBILINOGEN 1.0 02/22/2013 1318   NITRITE NEGATIVE 02/22/2013 1318   LEUKOCYTESUR NEGATIVE 02/22/2013 1318     Microbiology No results found for this or any previous visit (from the past 240 hour(s)).   Inpatient Medications:   Scheduled Meds: . furosemide  40 mg Intravenous Once   Continuous Infusions: . nitroGLYCERIN       Radiological Exams on Admission: Dg Chest Portable 1 View  Result Date: 09/14/2017 CLINICAL DATA:  Chest pain EXAM: PORTABLE CHEST 1 VIEW COMPARISON:  12/26/2013 FINDINGS: Cardiomegaly with interstitial coarsening diffusely. Vascular pedicle widening. Status post CABG. Remote right rib fractures. No effusion  or pneumothorax. IMPRESSION: CHF pattern, mild. Electronically Signed   By: Monte Fantasia M.D.   On: 09/14/2017 16:45    Impression/Recommendations Active Problems:   Diabetes mellitus, insulin dependent (IDDM), uncontrolled (HCC)   Unstable angina (HCC)  Insulin dependent diabetes mellitus Uncontrolled. CBG of 331 in the ED. Does not appear dry on physical exam. Urine ketones negative with >1000 glucose. -Continue Home regimen at 20% reduced dose of NPH 30 units BID (with lunch and dinner) and Regular 28 units BID (with lunch and dinner) -Resistant sliding scale insulin -Follow-up BMP to ensure no evidence of DKA -Hemoglobin A1C  Chronic cough Patient states he is unaware of this diagnosis. Per Dr. Annamaria Boots, patient has COPD vs upper airway cough syndrome. Patient not on therapy.  Chest pain Unstable angina Plan for heart cath on 09/16/2017 -Plan per primary  CAD Patient is s/p 6 vessel CABG. On Imdur and Crestor as an outpatient. -per primary   Thank you for this consultation.  Our [facility] hospitalist team will follow the patient with you.     Cordelia Poche M.D. [facility] [email or web address] Password [facility]  09/14/2017, 4:56 PM

## 2017-09-14 NOTE — ED Notes (Signed)
Cardiology at bedside speaking with patient

## 2017-09-14 NOTE — ED Notes (Signed)
Dinner tray ordered; carb modified diet

## 2017-09-14 NOTE — ED Notes (Signed)
Pt transferred from ED stretcher to hospital bed

## 2017-09-14 NOTE — ED Triage Notes (Signed)
Pt arrived via [facility] from home where pt called EMS r/t sudden onset of substernal CP today around 1330. Pt reports recent BP medication changes. Pt is scheduled to have a cath on Monday. Pt took 2 Nitro at home, 2-325 ASA, EMS gave 1 more nitro, 4 mg morphine PTA. Pt presents to ED pain free.

## 2017-09-15 ENCOUNTER — Inpatient Hospital Stay (HOSPITAL_COMMUNITY): Payer: Medicare Other

## 2017-09-15 LAB — COMPREHENSIVE METABOLIC PANEL
ALT: 13 U/L — ABNORMAL LOW (ref 17–63)
ANION GAP: 9 (ref 5–15)
AST: 12 U/L — AB (ref 15–41)
Albumin: 3.2 g/dL — ABNORMAL LOW (ref 3.5–5.0)
Alkaline Phosphatase: 78 U/L (ref 38–126)
BUN: 36 mg/dL — AB (ref 6–20)
CHLORIDE: 106 mmol/L (ref 101–111)
CO2: 24 mmol/L (ref 22–32)
Calcium: 8.5 mg/dL — ABNORMAL LOW (ref 8.9–10.3)
Creatinine, Ser: 2.37 mg/dL — ABNORMAL HIGH (ref 0.61–1.24)
GFR, EST AFRICAN AMERICAN: 29 mL/min — AB (ref >60)
GFR, EST NON AFRICAN AMERICAN: 25 mL/min — AB (ref >60)
Glucose, Bld: 220 mg/dL — ABNORMAL HIGH (ref 65–99)
POTASSIUM: 4.3 mmol/L (ref 3.5–5.1)
Sodium: 139 mmol/L (ref 135–145)
TOTAL PROTEIN: 6.2 g/dL — AB (ref 6.5–8.1)
Total Bilirubin: 0.6 mg/dL (ref 0.3–1.2)

## 2017-09-15 LAB — CBG MONITORING, ED
GLUCOSE-CAPILLARY: 194 mg/dL — AB (ref 65–99)
GLUCOSE-CAPILLARY: 228 mg/dL — AB (ref 65–99)
Glucose-Capillary: 229 mg/dL — ABNORMAL HIGH (ref 65–99)

## 2017-09-15 LAB — GLUCOSE, CAPILLARY: Glucose-Capillary: 93 mg/dL (ref 65–99)

## 2017-09-15 LAB — CBC
HEMATOCRIT: 43.7 % (ref 39.0–52.0)
Hemoglobin: 14.7 g/dL (ref 13.0–17.0)
MCH: 28.9 pg (ref 26.0–34.0)
MCHC: 33.6 g/dL (ref 30.0–36.0)
MCV: 85.9 fL (ref 78.0–100.0)
Platelets: 137 10*3/uL — ABNORMAL LOW (ref 150–400)
RBC: 5.09 MIL/uL (ref 4.22–5.81)
RDW: 14.5 % (ref 11.5–15.5)
WBC: 11.3 10*3/uL — AB (ref 4.0–10.5)

## 2017-09-15 LAB — HEPARIN LEVEL (UNFRACTIONATED)
HEPARIN UNFRACTIONATED: 0.26 [IU]/mL — AB (ref 0.30–0.70)
HEPARIN UNFRACTIONATED: 0.41 [IU]/mL (ref 0.30–0.70)
HEPARIN UNFRACTIONATED: 0.45 [IU]/mL (ref 0.30–0.70)

## 2017-09-15 LAB — LIPID PANEL
CHOL/HDL RATIO: 4.8 ratio
Cholesterol: 135 mg/dL (ref 0–200)
HDL: 28 mg/dL — ABNORMAL LOW (ref >40)
LDL Cholesterol: 73 mg/dL (ref 0–99)
Triglycerides: 171 mg/dL — ABNORMAL HIGH (ref <150)
VLDL: 34 mg/dL (ref 0–40)

## 2017-09-15 LAB — HEMOGLOBIN A1C
HEMOGLOBIN A1C: 9.1 % — AB (ref 4.8–5.6)
MEAN PLASMA GLUCOSE: 214.47 mg/dL

## 2017-09-15 LAB — TROPONIN I: TROPONIN I: 0.18 ng/mL — AB (ref <0.03)

## 2017-09-15 LAB — BRAIN NATRIURETIC PEPTIDE: B NATRIURETIC PEPTIDE 5: 164.2 pg/mL — AB (ref 0.0–100.0)

## 2017-09-15 LAB — MRSA PCR SCREENING: MRSA by PCR: POSITIVE — AB

## 2017-09-15 MED ORDER — NITROGLYCERIN IN D5W 200-5 MCG/ML-% IV SOLN: 3.0000 ug/min | INTRAVENOUS | Status: DC

## 2017-09-15 MED ORDER — HEPARIN BOLUS VIA INFUSION
2000.0000 [IU] | Freq: Once | INTRAVENOUS | Status: AC
  Administered 2017-09-15: 2000 [IU] via INTRAVENOUS
  Filled 2017-09-15: qty 2000

## 2017-09-15 MED ORDER — IPRATROPIUM BROMIDE 0.02 % IN SOLN
2.5000 mL | Freq: Four times a day (QID) | RESPIRATORY_TRACT | Status: DC
  Administered 2017-09-16: 0.5 mg via RESPIRATORY_TRACT
  Filled 2017-09-15 (×3): qty 2.5

## 2017-09-15 MED ORDER — SODIUM CHLORIDE 0.9 % WEIGHT BASED INFUSION
1.0000 mL/kg/h | INTRAVENOUS | Status: DC
  Administered 2017-09-16 – 2017-09-17 (×3): 1 mL/kg/h via INTRAVENOUS

## 2017-09-15 MED ORDER — CALCIUM CARBONATE ANTACID 500 MG PO CHEW
1.0000 | CHEWABLE_TABLET | Freq: Two times a day (BID) | ORAL | Status: DC | PRN
  Administered 2017-09-15: 200 mg via ORAL
  Filled 2017-09-15 (×2): qty 1

## 2017-09-15 MED ORDER — NITROGLYCERIN IN D5W 200-5 MCG/ML-% IV SOLN
0.0000 ug/min | INTRAVENOUS | Status: DC
  Administered 2017-09-15: 10 ug/min via INTRAVENOUS

## 2017-09-15 NOTE — Progress Notes (Signed)
ANTICOAGULATION CONSULT NOTE - Follow Up Consult  Pharmacy Consult for heparin Indication: chest pain/ACS  Allergies  Allergen Reactions  . Cefuroxime Diarrhea  . Ezetimibe Diarrhea  . Pravastatin Other (See Comments)    myalgias    Patient Measurements: Height: 5\' 9"  (175.3 cm) Weight: 220 lb (99.8 kg) IBW/kg (Calculated) : 70.7 Heparin Dosing Weight: 91.8kg  Vital Signs: BP: 157/76 (11/18 1300) Pulse Rate: 90 (11/18 1300)  Labs: Recent Labs    09/14/17 1618 09/14/17 1801 09/14/17 2245 09/15/17 0255 09/15/17 0522 09/15/17 1140  HGB 16.4  --   --   --  14.7  --   HCT 48.2  --   --   --  43.7  --   PLT 132*  --   --   --  137*  --   LABPROT 13.8  --   --   --   --   --   INR 1.07  --   --   --   --   --   HEPARINUNFRC  --   --   --  0.26*  --  0.41  CREATININE 2.41*  --   --   --  2.37*  --   TROPONINI  --  0.05* <0.03  --  0.18*  --     Estimated Creatinine Clearance: 30.4 mL/min (A) (by C-G formula based on SCr of 2.37 mg/dL (H)).   Medical History: Past Medical History:  Diagnosis Date  . A-fib ([place])   . Diabetes mellitus without complication ([place])   . Hypertension     Medications:  Infusions:  . heparin 1,400 Units/hr (09/15/17 0418)    Assessment: 54 yom presented to the ED with chest pain. Currently on IV heparin at 1400 units/hr. HL is now therapeutic. H/H wnl. Plt low stable. SCr 2.37  Goal of Therapy:  Heparin level 0.3-0.7 units/ml Monitor platelets by anticoagulation protocol: Yes   Plan:  Continue IV heparin at 1400 units/hr  Confirmatory 8 hr HL  Daily heparin level and CBC  Albertina Parr, PharmD., BCPS Clinical Pharmacist Pager [phone]

## 2017-09-15 NOTE — ED Notes (Signed)
ECHO in progress at bedside at this time.

## 2017-09-15 NOTE — Progress Notes (Signed)
  Echocardiogram 2D Echocardiogram has been performed.  Cameron [name] [name] 09/15/2017, 4:38 PM

## 2017-09-15 NOTE — Progress Notes (Signed)
Subjective:  Feels much better.  Dyspnea has also improved.  No further chest pain.  Objective:  Vital Signs in the last 24 hours: Temp:  [98 F (36.7 C)] 98 F (36.7 C) (11/17 1610) Pulse Rate:  [79-105] 90 (11/18 1300) Resp:  [15-28] 17 (11/18 1300) BP: (113-213)/(52-113) 157/76 (11/18 1300) SpO2:  [90 %-97 %] 95 % (11/18 1300) Weight:  [99.8 kg (220 lb)] 99.8 kg (220 lb) (11/17 1611)  Intake/Output from previous day: 11/17 0701 - 11/18 0700 In: -  Out: 425 [Urine:425] Intake/Output from this shift: Total I/O In: -  Out: 500 [Urine:500]  Physical Exam: Blood pressure (!) 157/76, pulse 90, temperature 98 F (36.7 C), temperature source Oral, resp. rate 17, height 5\' 9"  (1.753 m), weight 99.8 kg (220 lb), SpO2 95 %. Body mass index is 32.49 kg/m.  Well developed, mild respiratory distress, mildly obese.   HENT:  Head: Normocephalic and atraumatic.  Eyes: Conjunctivae and EOM are normal.  Neck: Short neck and unable to make out JVD Cardiovascular:  Distant heart sounds. S1 appears muffled, pansystolic murmur in the apex. No gallop. Normal S2.  Vascular exam: Bilateral carotid bruit present. Femoral pulse difficult to feel due to obesity. Left popliteal normal and right faint. Absent pedal pulses bilateral  Respiratory: No stridor. He has right  bibasilar crackles.  GI: Soft.  Ventral hernia noted. BS present in all 4 quadrants  Musculoskeletal: Normal range of motion. He exhibits no edema.  Lymphadenopathy:   He has no cervical adenopathy.  Neurological: He is alert and oriented to person, place, and time.  Skin: Skin is warm and dry.    Lab Results: Recent Labs    09/14/17 1618 09/15/17 0522  WBC 7.0 11.3*  HGB 16.4 14.7  PLT 132* 137*   Recent Labs    09/14/17 1618 09/15/17 0522  NA 137 139  K 4.3 4.3  CL 104 106  CO2 26 24  GLUCOSE 351* 220*  BUN 36* 36*  CREATININE 2.41* 2.37*   Recent Labs    09/14/17 2245 09/15/17 0522  TROPONINI <0.03 0.18*    Hepatic Function Panel Recent Labs    09/15/17 0522  PROT 6.2*  ALBUMIN 3.2*  AST 12*  ALT 13*  ALKPHOS 78  BILITOT 0.6   Recent Labs    09/15/17 0255  CHOL 135   Cardiac Panel (last 3 results) Recent Labs    09/14/17 1801 09/14/17 2245 09/15/17 0522  TROPONINI 0.05* <0.03 0.18*    Imaging: Dg Chest Portable 1 View  Result Date: 09/14/2017 CLINICAL DATA:  Chest pain EXAM: PORTABLE CHEST 1 VIEW COMPARISON:  12/26/2013 FINDINGS: Cardiomegaly with interstitial coarsening diffusely. Vascular pedicle widening. Status post CABG. Remote right rib fractures. No effusion or pneumothorax. IMPRESSION: CHF pattern, mild. Electronically Signed   By: Monte Fantasia M.D.   On: 09/14/2017 16:45   Scheduled Meds: . aspirin EC  81 mg Oral Daily  . insulin aspart  0-20 Units Subcutaneous TID WC  . insulin aspart  28 Units Subcutaneous BID AC  . insulin NPH Human  40 Units Subcutaneous BID AC  . metoprolol succinate  50 mg Oral Daily   Continuous Infusions: . heparin 1,400 Units/hr (09/15/17 0418)   PRN Meds:.acetaminophen, calcium carbonate, nitroGLYCERIN, ondansetron (ZOFRAN) IV, zolpidem  Cardiac Studies: Echo pending.  EKG 09/14/2017: Sinus tachycardia at rate of 105 bpm, left atrial enlargement, left axis deviation, left anterior fascicular block.  Inferior infarct old.  Right bundle branch block.  LVH with repolarization  abnormality, cannot exclude inferior and lateral ischemia.  Abnormal EKG.  Assessment/Plan:  1.  Acute coronary syndrome, unstable angina pectoris, patient with nitrate responsive chest pain, states that it is similar to his angina pectoris prior to coronary artery disease and CABG. Chest pain free.  2.  CAD S/P CABG in 2002, complete graft report not available. 3.  Shortness of breath due to underlying COPD, patient also in acute probably diastolic heart failure on chronic diastolic heart failure.  Echocardiogram pending. 4.  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  Faint bilateral carotid bruit 6.  Hyperlipidemia 7.  Thrombocytopenia stable   Recommendations: Patient chest pain-free, continue IV heparin and IV nitroglycerin.  I will discontinue furosemide, patient's respiration has improved since yesterday, will avoid diuretics in preparation for cardiac catheterization in the morning. Discussed risks, benefits and alternatives of angiogram including but not limited to <1% risk of death, stroke, MI, need for urgent surgical revascularization, 3-5% risk of renal failure, but not limited to thest. patient is willing to proceed.  Medications ordered by me yesterday has not been released by the nursing her and hence not on a statin, will inform RN.    LOS: 1 day    Cameron [name] 09/15/2017, 2:08 PM

## 2017-09-15 NOTE — ED Notes (Signed)
Attempted to call report x 1 to [place]. 

## 2017-09-15 NOTE — Progress Notes (Signed)
PROGRESS NOTE    Cameron [name]  MHD:[id number] DOB: 09-Nov-1940 DOA: 09/14/2017 PCP: Parke Poisson, MD   Brief Narrative: Cameron [name] is a 76 y.o. male with a history of CAD status post CABG, diabetes mellitus, chronic cough syndrome. He presented with chest pain and admitted for unstable angina. Glucose elevated to 300s on admission. No evidence of DKA. Cardiology planning to perform heart catheterization on Monday. Patient on heparin drip.   Assessment & Plan:   Active Problems:   Diabetes mellitus, insulin dependent (IDDM), uncontrolled (HCC)   Unstable angina (HCC)   Insulin dependent diabetes mellitus Uncontrolled. Not given insulin as ordered yesterday. BMP does not suggest DKA. Hemoglobin A1C of 9.1% -Continue Home regimen at 20% reduced dose of NPH 30 units BID (with lunch and dinner) and Regular 28 units BID (with lunch and dinner) -Resistant sliding scale insulin  Chronic cough Patient states he is unaware of this diagnosis. Per Dr. Annamaria Boots, patient has COPD vs upper airway cough syndrome. Patient not on therapy. Stable.  Chest pain Unstable angina Plan for heart cath on 09/16/2017. No recurrent chest pain since admission. On heparin drip. -Plan per primary  CAD Patient is s/p 6 vessel CABG. On Imdur and Crestor as an outpatient. -Per primary   DVT prophylaxis: Heparin drip Code Status: Full code Family Communication: None at bedside Disposition Plan: Per primary   Procedures:   None  Antimicrobials:  None    Subjective: No chest pain or dyspnea.  Objective: Vitals:   09/15/17 0800 09/15/17 0830 09/15/17 0900 09/15/17 0916  BP: 132/86 (!) 158/73 (!) 145/64 (!) 149/73  Pulse:  87 83 84  Resp: (!) 23 17 20  (!) 21  Temp:      TempSrc:      SpO2:  94% 90% 93%  Weight:      Height:        Intake/Output Summary (Last 24 hours) at 09/15/2017 0920 Last data filed at 09/14/2017 1925 Gross per 24 hour  Intake -  Output 425 ml  Net -425  ml   Filed Weights   09/14/17 1611  Weight: 99.8 kg (220 lb)    Examination:  General exam: Appears calm and comfortable Respiratory system: Clear to auscultation. Respiratory effort normal. Cardiovascular system: S1 & S2 heard, RRR. Systolic murmur. Gastrointestinal system: Abdomen is nondistended, soft and nontender. Normal bowel sounds heard. Central nervous system: Alert and oriented. No focal neurological deficits. Extremities: No edema. No calf tenderness Skin: No cyanosis. No rashes Psychiatry: Judgement and insight appear normal. Mood & affect appropriate.     Data Reviewed: I have personally reviewed following labs and imaging studies  CBC: Recent Labs  Lab 09/14/17 1618 09/15/17 0522  WBC 7.0 11.3*  NEUTROABS 4.0  --   HGB 16.4 14.7  HCT 48.2 43.7  MCV 85.8 85.9  PLT 132* 211*   Basic Metabolic Panel: Recent Labs  Lab 09/14/17 1618 09/15/17 0522  NA 137 139  K 4.3 4.3  CL 104 106  CO2 26 24  GLUCOSE 351* 220*  BUN 36* 36*  CREATININE 2.41* 2.37*  CALCIUM 9.0 8.5*   GFR: Estimated Creatinine Clearance: 30.4 mL/min (A) (by C-G formula based on SCr of 2.37 mg/dL (H)). Liver Function Tests: Recent Labs  Lab 09/14/17 1618 09/15/17 0522  AST 14* 12*  ALT 17 13*  ALKPHOS 92 78  BILITOT 0.5 0.6  PROT 7.2 6.2*  ALBUMIN 3.5 3.2*   No results for input(s): LIPASE, AMYLASE in the last  168 hours. No results for input(s): AMMONIA in the last 168 hours. Coagulation Profile: Recent Labs  Lab 09/14/17 1618  INR 1.07   Cardiac Enzymes: Recent Labs  Lab 09/14/17 1801 09/14/17 2245 09/15/17 0522  TROPONINI 0.05* <0.03 0.18*   BNP (last 3 results) No results for input(s): PROBNP in the last 8760 hours. HbA1C: Recent Labs    09/15/17 0255  HGBA1C 9.1*   CBG: Recent Labs  Lab 09/14/17 1625 09/15/17 0249 09/15/17 0848  GLUCAP 331* 194* 229*   Lipid Profile: Recent Labs    09/14/17 2245 09/15/17 0255  CHOL  --  135  HDL  --  28*    LDLCALC  --  73  TRIG 155* 171*  CHOLHDL  --  4.8   Thyroid Function Tests: Recent Labs    09/14/17 1801  TSH 3.159   Anemia Panel: No results for input(s): VITAMINB12, FOLATE, FERRITIN, TIBC, IRON, RETICCTPCT in the last 72 hours. Sepsis Labs: No results for input(s): PROCALCITON, LATICACIDVEN in the last 168 hours.  No results found for this or any previous visit (from the past 240 hour(s)).       Radiology Studies: Dg Chest Portable 1 View  Result Date: 09/14/2017 CLINICAL DATA:  Chest pain EXAM: PORTABLE CHEST 1 VIEW COMPARISON:  12/26/2013 FINDINGS: Cardiomegaly with interstitial coarsening diffusely. Vascular pedicle widening. Status post CABG. Remote right rib fractures. No effusion or pneumothorax. IMPRESSION: CHF pattern, mild. Electronically Signed   By: Monte Fantasia M.D.   On: 09/14/2017 16:45        Scheduled Meds: . aspirin EC  81 mg Oral Daily  . furosemide  40 mg Intravenous Daily  . insulin aspart  0-20 Units Subcutaneous TID WC  . insulin aspart  28 Units Subcutaneous BID AC  . insulin NPH Human  40 Units Subcutaneous BID AC  . metoprolol succinate  50 mg Oral Daily   Continuous Infusions: . heparin 1,400 Units/hr (09/15/17 0418)     LOS: 1 day     Cordelia Poche, MD [facility] 09/15/2017, 9:20 AM Pager: [phone]) [phone]  If 7PM-7AM, please contact night-coverage [email or web address] Password [email or web address] 09/15/2017, 9:20 AM

## 2017-09-15 NOTE — ED Notes (Addendum)
Pulse ox 90-91% on room air. Oxygen 1 LPM via nasal cannula applied to patient. No resp distress noted. Patient sitting on side of bed.

## 2017-09-15 NOTE — Progress Notes (Signed)
Pt arrived from the ED. Pt is alert and oriented x4. Vital signs within normal limits. Pt denies chest pain. Pt and family members oriented to room/unit. Call bell within reach and bed in low position.

## 2017-09-15 NOTE — Progress Notes (Signed)
ANTICOAGULATION CONSULT NOTE - Follow Up Consult  Pharmacy Consult for heparin Indication: chest pain/ACS  Labs: Recent Labs    09/14/17 1618 09/14/17 1801 09/14/17 2245 09/15/17 0255  HGB 16.4  --   --   --   HCT 48.2  --   --   --   PLT 132*  --   --   --   LABPROT 13.8  --   --   --   INR 1.07  --   --   --   HEPARINUNFRC  --   --   --  0.26*  CREATININE 2.41*  --   --   --   TROPONINI  --  0.05* <0.03  --     Assessment: 76yo male subtherapeutic on heparin with initial dosing for CP.  Goal of Therapy:  Heparin level 0.3-0.7 units/ml   Plan:  Will rebolus with heparin 2000 units and increase gtt by 2 units/kg/hr to 1400 units/hr and check level in [place], PharmD, BCPS  09/15/2017,3:44 AM

## 2017-09-15 NOTE — ED Notes (Signed)
ECHO still in progress at bedside.

## 2017-09-15 NOTE — Progress Notes (Signed)
ANTICOAGULATION CONSULT NOTE - Follow Up Consult  Pharmacy Consult for heparin Indication: chest pain/ACS  Allergies  Allergen Reactions  . Cefuroxime Diarrhea  . Ezetimibe Diarrhea  . Pravastatin Other (See Comments)    myalgias    Patient Measurements: Height: 5\' 9"  (175.3 cm) Weight: 220 lb (99.8 kg) IBW/kg (Calculated) : 70.7 Heparin Dosing Weight: 91.8kg  Vital Signs: Temp: 98.2 F (36.8 C) (11/18 1957) Temp Source: Oral (11/18 1957) BP: 162/84 (11/18 1957) Pulse Rate: 80 (11/18 1957)  Labs: Recent Labs    09/14/17 1618 09/14/17 1801 09/14/17 2245 09/15/17 0255 09/15/17 0522 09/15/17 1140 09/15/17 2020  HGB 16.4  --   --   --  14.7  --   --   HCT 48.2  --   --   --  43.7  --   --   PLT 132*  --   --   --  137*  --   --   LABPROT 13.8  --   --   --   --   --   --   INR 1.07  --   --   --   --   --   --   HEPARINUNFRC  --   --   --  0.26*  --  0.41 0.45  CREATININE 2.41*  --   --   --  2.37*  --   --   TROPONINI  --  0.05* <0.03  --  0.18*  --   --     Estimated Creatinine Clearance: 30.4 mL/min (A) (by C-G formula based on SCr of 2.37 mg/dL (H)).   Medical History: Past Medical History:  Diagnosis Date  . A-fib ([place])   . Diabetes mellitus without complication ([place])   . Hypertension     Medications:  Infusions:  . sodium chloride 1 mL/kg/hr (09/15/17 2009)  . heparin 1,400 Units/hr (09/15/17 0418)  . nitroGLYCERIN 10 mcg/min (09/15/17 1752)    Assessment: 66 yom presented to the ED with chest pain. Currently on IV heparin   Heparin level this evening remains therapeutic (HL 0.45 << 0.41, goal of 0.3-0.7). No bleeding or issues noted at this time.   Goal of Therapy:  Heparin level 0.3-0.7 units/ml Monitor platelets by anticoagulation protocol: Yes   Plan:  1. Continue IV heparin at 1400 units/hr  2. Will continue to monitor for any signs/symptoms of bleeding and will follow up with heparin level in the a.m.   Thank you for allowing  pharmacy to be a part of this patient's care.  Alycia Rossetti, PharmD, BCPS Clinical Pharmacist Pager: [phone] If after 3:30p, please call main pharmacy at: [phone] 09/15/2017 10:02 PM

## 2017-09-16 ENCOUNTER — Encounter (HOSPITAL_COMMUNITY)
Admission: EM | Disposition: A | Discharge status: Home / Self Care | Payer: Self-pay | Source: Home / Self Care | Attending: Cardiology

## 2017-09-16 DIAGNOSIS — N183 Chronic kidney disease, stage 3 unspecified: Secondary | ICD-10-CM | POA: Insufficient documentation

## 2017-09-16 DIAGNOSIS — N184 Chronic kidney disease, stage 4 (severe): Secondary | ICD-10-CM

## 2017-09-16 LAB — ECHOCARDIOGRAM COMPLETE
AO mean calculated velocity dopler: 274 cm/s
AOPV: 0.29 m/s
AOVTI: 72.2 cm
AV Area VTI index: 0.37 cm2/m2
AV Area VTI: 0.9 cm2
AV Mean grad: 36 mmHg
AV VEL mean LVOT/AV: 0.26
AV pk vel: 399 cm/s
AVAREAMEANV: 0.81 cm2
AVAREAMEANVIN: 0.36 cm2/m2
AVPG: 64 mmHg
CHL CUP AV PEAK INDEX: 0.4
CHL CUP AV VALUE AREA INDEX: 0.37
CHL CUP AV VEL: 0.83
CHL CUP DOP CALC LVOT VTI: 19.1 cm
E decel time: 204 msec
E/e' ratio: 17.61
FS: 35 % (ref 28–44)
HEIGHTINCHES: 69 in
IV/PV OW: 1.01
LA diam end sys: 43 mm
LA vol A4C: 92.1 ml
LA vol index: 36.5 mL/m2
LADIAMINDEX: 1.92 cm/m2
LASIZE: 43 mm
LAVOL: 81.7 mL
LV E/e'average: 17.61
LV TDI E'MEDIAL: 5.87
LV e' LATERAL: 6.53 cm/s
LVEEMED: 17.61
LVOT SV: 60 mL
LVOT area: 3.14 cm2
LVOT diameter: 20 mm
LVOT peak VTI: 0.26 cm
LVOT peak grad rest: 5 mmHg
LVOT peak vel: 114 cm/s
Lateral S' vel: 14 cm/s
MV Dec: 204
MVPG: 5 mmHg
MVPKAVEL: 110 m/s
MVPKEVEL: 115 m/s
PW: 18.4 mm — AB (ref 0.6–1.1)
TAPSE: 18.3 mm
TDI e' lateral: 6.53
Valve area: 0.83 cm2
Weight: 3520 oz

## 2017-09-16 LAB — BASIC METABOLIC PANEL
Anion gap: 10 (ref 5–15)
BUN: 34 mg/dL — AB (ref 6–20)
CALCIUM: 8.9 mg/dL (ref 8.9–10.3)
CHLORIDE: 101 mmol/L (ref 101–111)
CO2: 28 mmol/L (ref 22–32)
CREATININE: 2.47 mg/dL — AB (ref 0.61–1.24)
GFR calc Af Amer: 27 mL/min — ABNORMAL LOW (ref >60)
GFR calc non Af Amer: 24 mL/min — ABNORMAL LOW (ref >60)
GLUCOSE: 243 mg/dL — AB (ref 65–99)
Potassium: 4.4 mmol/L (ref 3.5–5.1)
Sodium: 139 mmol/L (ref 135–145)

## 2017-09-16 LAB — BRAIN NATRIURETIC PEPTIDE: B Natriuretic Peptide: 163.8 pg/mL — ABNORMAL HIGH (ref 0.0–100.0)

## 2017-09-16 LAB — GLUCOSE, CAPILLARY
GLUCOSE-CAPILLARY: 188 mg/dL — AB (ref 65–99)
Glucose-Capillary: 121 mg/dL — ABNORMAL HIGH (ref 65–99)
Glucose-Capillary: 134 mg/dL — ABNORMAL HIGH (ref 65–99)
Glucose-Capillary: 189 mg/dL — ABNORMAL HIGH (ref 65–99)
Glucose-Capillary: 235 mg/dL — ABNORMAL HIGH (ref 65–99)
Glucose-Capillary: 266 mg/dL — ABNORMAL HIGH (ref 65–99)

## 2017-09-16 LAB — HEPARIN LEVEL (UNFRACTIONATED): Heparin Unfractionated: 0.35 IU/mL (ref 0.30–0.70)

## 2017-09-16 LAB — CBC
HEMATOCRIT: 47.4 % (ref 39.0–52.0)
HEMOGLOBIN: 15.9 g/dL (ref 13.0–17.0)
MCH: 29.3 pg (ref 26.0–34.0)
MCHC: 33.5 g/dL (ref 30.0–36.0)
MCV: 87.3 fL (ref 78.0–100.0)
Platelets: 141 10*3/uL — ABNORMAL LOW (ref 150–400)
RBC: 5.43 MIL/uL (ref 4.22–5.81)
RDW: 14.4 % (ref 11.5–15.5)
WBC: 8.9 10*3/uL (ref 4.0–10.5)

## 2017-09-16 SURGERY — LEFT HEART CATH AND CORS/GRAFTS ANGIOGRAPHY
Anesthesia: LOCAL

## 2017-09-16 MED ORDER — IPRATROPIUM BROMIDE 0.02 % IN SOLN
2.5000 mL | Freq: Three times a day (TID) | RESPIRATORY_TRACT | Status: DC
  Filled 2017-09-16: qty 2.5

## 2017-09-16 MED ORDER — ROSUVASTATIN CALCIUM 20 MG PO TABS
40.0000 mg | ORAL_TABLET | Freq: Every day | ORAL | Status: DC
  Administered 2017-09-16 – 2017-09-24 (×8): 40 mg via ORAL
  Filled 2017-09-16: qty 2
  Filled 2017-09-16 (×5): qty 4
  Filled 2017-09-16: qty 2
  Filled 2017-09-16 (×2): qty 4

## 2017-09-16 MED ORDER — MUPIROCIN 2 % EX OINT
1.0000 "application " | TOPICAL_OINTMENT | Freq: Two times a day (BID) | CUTANEOUS | Status: AC
  Administered 2017-09-16 – 2017-09-20 (×10): 1 via NASAL
  Filled 2017-09-16 (×5): qty 22

## 2017-09-16 MED ORDER — SODIUM CHLORIDE 0.9 % IV SOLN: 250.0000 mL | INTRAVENOUS | Status: DC | PRN

## 2017-09-16 MED ORDER — ISOSORBIDE MONONITRATE ER 30 MG PO TB24
30.0000 mg | ORAL_TABLET | Freq: Every day | ORAL | Status: DC
  Administered 2017-09-17: 30 mg via ORAL
  Filled 2017-09-16: qty 1

## 2017-09-16 MED ORDER — TICAGRELOR 90 MG PO TABS
90.0000 mg | ORAL_TABLET | Freq: Two times a day (BID) | ORAL | Status: DC
  Administered 2017-09-17 – 2017-09-19 (×5): 90 mg via ORAL
  Filled 2017-09-16 (×5): qty 1

## 2017-09-16 MED ORDER — SODIUM CHLORIDE 0.9% FLUSH
3.0000 mL | Freq: Two times a day (BID) | INTRAVENOUS | Status: DC
  Administered 2017-09-16 (×3): 3 mL via INTRAVENOUS

## 2017-09-16 MED ORDER — SODIUM CHLORIDE 0.9% FLUSH: 3.0000 mL | INTRAVENOUS | Status: DC | PRN

## 2017-09-16 MED ORDER — CHLORHEXIDINE GLUCONATE CLOTH 2 % EX PADS
6.0000 | MEDICATED_PAD | Freq: Every day | CUTANEOUS | Status: AC
  Administered 2017-09-16 – 2017-09-20 (×4): 6 via TOPICAL

## 2017-09-16 MED ORDER — LIVING WELL WITH DIABETES BOOK
Freq: Once | Status: AC
  Administered 2017-09-16: 18:00:00
  Filled 2017-09-16: qty 1

## 2017-09-16 MED ORDER — TICAGRELOR 90 MG PO TABS
180.0000 mg | ORAL_TABLET | Freq: Once | ORAL | Status: AC
  Administered 2017-09-16: 180 mg via ORAL
  Filled 2017-09-16: qty 2

## 2017-09-16 NOTE — Progress Notes (Signed)
PROGRESS NOTE    Cameron [name]  VEH:[id number] DOB: [birth year] DOA: 09/14/2017 PCP: Parke Poisson, MD   Brief Narrative: Cameron [name] is a 76 y.o. male with a history of CAD status post CABG, diabetes mellitus, chronic cough syndrome. He presented with chest pain and admitted for unstable angina. Glucose elevated to 300s on admission. No evidence of DKA. Cardiology planning to perform heart catheterization on Monday. Patient on heparin drip.   Assessment & Plan:   Active Problems:   Diabetes mellitus, insulin dependent (IDDM), uncontrolled (HCC)   Unstable angina (HCC)   Insulin dependent diabetes mellitus Uncontrolled. Not given insulin as ordered yesterday. BMP does not suggest DKA. Hemoglobin A1C of 9.1% -Hold home regimen at 20% reduced dose of NPH 30 units BID (with lunch and dinner) and Regular 28 units BID (with lunch and dinner) while NPO -Resistant sliding scale insulin -CBG q4 hours while NPO  Chronic cough Patient states he is unaware of this diagnosis. Per Dr. Annamaria Boots, patient has COPD vs upper airway cough syndrome. Patient not on therapy. Stable.  Chest pain Unstable angina Plan for heart cath on 09/16/2017. No recurrent chest pain since admission. On heparin drip. -Plan per primary  CAD Patient is s/p 6 vessel CABG. On Imdur and Crestor as an outpatient. -Per primary  CKD stage IV Stable. Last creatinine on file of 2.08.   DVT prophylaxis: Heparin drip Code Status: Full code Family Communication: None at bedside Disposition Plan: Per primary   Procedures:   None  Antimicrobials:  None    Subjective: No chest pain.  Objective: Vitals:   09/15/17 1854 09/15/17 1957 09/16/17 0409 09/16/17 0812  BP: (!) 146/76 (!) 162/84 (!) 158/82 (!) 148/72  Pulse: 84 80  88  Resp: (!) 21 (!) 21 20   Temp:  98.2 F (36.8 C) 97.9 F (36.6 C) 98.4 F (36.9 C)  TempSrc:  Oral Oral Oral  SpO2: 95% 93% 94% 97%  Weight:      Height:         Intake/Output Summary (Last 24 hours) at 09/16/2017 0843 Last data filed at 09/16/2017 0700 Gross per 24 hour  Intake 1488 ml  Output 1750 ml  Net -262 ml   Filed Weights   09/14/17 1611  Weight: 99.8 kg (220 lb)    Examination:  General exam: Appears calm and comfortable Respiratory system: Regular rate and rhythm. Normal S1 and S2. No heart murmurs present. No extra heart sounds Cardiovascular system: S1 & S2 heard, RRR. Systolic murmur. Gastrointestinal system: Soft, non-tender, non-distended, no guarding, no rebound, no masses felt Central nervous system: Alert and oriented. No focal neurological deficits. Extremities: No edema. No calf tenderness Skin: No cyanosis. No rashes Psychiatry: Judgement and insight appear normal. Mood & affect appropriate.     Data Reviewed: I have personally reviewed following labs and imaging studies  CBC: Recent Labs  Lab 09/14/17 1618 09/15/17 0522 09/16/17 0320  WBC 7.0 11.3* 8.9  NEUTROABS 4.0  --   --   HGB 16.4 14.7 15.9  HCT 48.2 43.7 47.4  MCV 85.8 85.9 87.3  PLT 132* 137* 836*   Basic Metabolic Panel: Recent Labs  Lab 09/14/17 1618 09/15/17 0522 09/16/17 0551  NA 137 139 139  K 4.3 4.3 4.4  CL 104 106 101  CO2 26 24 28   GLUCOSE 351* 220* 243*  BUN 36* 36* 34*  CREATININE 2.41* 2.37* 2.47*  CALCIUM 9.0 8.5* 8.9   GFR: Estimated Creatinine Clearance: 29.2 mL/min (  A) (by C-G formula based on SCr of 2.47 mg/dL (H)). Liver Function Tests: Recent Labs  Lab 09/14/17 1618 09/15/17 0522  AST 14* 12*  ALT 17 13*  ALKPHOS 92 78  BILITOT 0.5 0.6  PROT 7.2 6.2*  ALBUMIN 3.5 3.2*   No results for input(s): LIPASE, AMYLASE in the last 168 hours. No results for input(s): AMMONIA in the last 168 hours. Coagulation Profile: Recent Labs  Lab 09/14/17 1618  INR 1.07   Cardiac Enzymes: Recent Labs  Lab 09/14/17 1801 09/14/17 2245 09/15/17 0522  TROPONINI 0.05* <0.03 0.18*   BNP (last 3 results) No  results for input(s): PROBNP in the last 8760 hours. HbA1C: Recent Labs    09/15/17 0255  HGBA1C 9.1*   CBG: Recent Labs  Lab 09/15/17 0848 09/15/17 1233 09/15/17 1719 09/16/17 0032 09/16/17 0637  GLUCAP 229* 228* 93 266* 235*   Lipid Profile: Recent Labs    09/14/17 2245 09/15/17 0255  CHOL  --  135  HDL  --  28*  LDLCALC  --  73  TRIG 155* 171*  CHOLHDL  --  4.8   Thyroid Function Tests: Recent Labs    09/14/17 1801  TSH 3.159   Anemia Panel: No results for input(s): VITAMINB12, FOLATE, FERRITIN, TIBC, IRON, RETICCTPCT in the last 72 hours. Sepsis Labs: No results for input(s): PROCALCITON, LATICACIDVEN in the last 168 hours.  Recent Results (from the past 240 hour(s))  MRSA PCR Screening     Status: Abnormal   Collection Time: 09/15/17  5:57 PM  Result Value Ref Range Status   MRSA by PCR POSITIVE (A) NEGATIVE Final    Comment:        The GeneXpert MRSA Assay (FDA approved for NASAL specimens only), is one component of a comprehensive MRSA colonization surveillance program. It is not intended to diagnose MRSA infection nor to guide or monitor treatment for MRSA infections. RESULT CALLED TO, READ BACK BY AND VERIFIED WITH: R. [place] 09/15/2017 TRosalia Hammers          Radiology Studies: Dg Chest Portable 1 View  Result Date: 09/14/2017 CLINICAL DATA:  Chest pain EXAM: PORTABLE CHEST 1 VIEW COMPARISON:  12/26/2013 FINDINGS: Cardiomegaly with interstitial coarsening diffusely. Vascular pedicle widening. Status post CABG. Remote right rib fractures. No effusion or pneumothorax. IMPRESSION: CHF pattern, mild. Electronically Signed   By: Monte Fantasia M.D.   On: 09/14/2017 16:45        Scheduled Meds: . aspirin EC  81 mg Oral Daily  . insulin aspart  0-20 Units Subcutaneous TID WC  . insulin aspart  28 Units Subcutaneous BID AC  . insulin NPH Human  40 Units Subcutaneous BID AC  . ipratropium  2.5 mL Inhalation Q6H  . metoprolol  succinate  50 mg Oral Daily  . sodium chloride flush  3 mL Intravenous Q12H   Continuous Infusions: . sodium chloride    . sodium chloride 1 mL/kg/hr (09/16/17 0700)  . heparin 1,400 Units/hr (09/16/17 0700)  . nitroGLYCERIN 10 mcg/min (09/15/17 1752)     LOS: 2 days     Cordelia Poche, MD [facility] 09/16/2017, 8:43 AM Pager: [phone]  If 7PM-7AM, please contact night-coverage [email or web address] Password [facility] 09/16/2017, 8:43 AM

## 2017-09-16 NOTE — Progress Notes (Signed)
Results for MILANO, ROSEVEAR (MRN [id number]) as of 09/16/2017 12:31  Ref. Range 09/15/2017 12:33 09/15/2017 17:19 09/16/2017 00:32 09/16/2017 06:37 09/16/2017 12:16  Glucose-Capillary Latest Ref Range: 65 - 99 mg/dL 228 (H) 93 266 (H) 235 (H) 188 (H)  Noted that if blood sugars continue to be greater than 180 mg/dl, recommend increasing Novolin NPH to 45 units BID (lunch and supper) and continue other insulin orders. Will continue to monitor blood sugars while in the hospital.    Harvel Ricks RN BSN CDE Diabetes Coordinator Pager: [phone]  8am-5pm

## 2017-09-16 NOTE — Pre-Procedure Instructions (Signed)
Cath postponed due to renal dysfunction. Patient may eat. Keep npo after midnight tonight.

## 2017-09-16 NOTE — Plan of Care (Signed)
Oriented to room,A/O instructed to call for help if needed or going to the bathroom.

## 2017-09-16 NOTE — Care Management Note (Signed)
Case Management Note Marvetta Gibbons RN, BSN Unit 4E-Case Manager [phone]  Patient Details  Name: Cameron [name] MRN: [id number] Date of Birth: April 05, 1941  Subjective/Objective:  Pt admitted with uncontrolled DM, chest pain- plan for cardiac cath                Action/Plan: PTA pt lived at home with wife- PCP- Parke Poisson,  CM to follow for d/c needs   Expected Discharge Date:                  Expected Discharge Plan:     In-House Referral:     Discharge planning Services     Post Acute Care Choice:    Choice offered to:     DME Arranged:    DME Agency:     HH Arranged:    [place] Agency:     Status of Service:     If discussed at [place] of Stay Meetings, dates discussed:    Discharge Disposition:   Additional Comments:  Dawayne Patricia, RN 09/16/2017, 11:21 AM

## 2017-09-16 NOTE — Progress Notes (Signed)
ANTICOAGULATION CONSULT NOTE - Follow Up Consult  Pharmacy Consult for Heparin Indication: chest pain/ACS  Allergies  Allergen Reactions  . Cefuroxime Diarrhea  . Ezetimibe Diarrhea  . Pravastatin Other (See Comments)    myalgias    Patient Measurements: Height: 5\' 9"  (175.3 cm) Weight: 220 lb (99.8 kg) IBW/kg (Calculated) : 70.7 Heparin Dosing Weight: 92 kg  Vital Signs: Temp: 98.4 F (36.9 C) (11/19 0812) Temp Source: Oral (11/19 0812) BP: 148/72 (11/19 0855) Pulse Rate: 79 (11/19 0855)  Labs: Recent Labs    09/14/17 1618 09/14/17 1801 09/14/17 2245  09/15/17 0522 09/15/17 1140 09/15/17 2020 09/16/17 0320 09/16/17 0551  HGB 16.4  --   --   --  14.7  --   --  15.9  --   HCT 48.2  --   --   --  43.7  --   --  47.4  --   PLT 132*  --   --   --  137*  --   --  141*  --   LABPROT 13.8  --   --   --   --   --   --   --   --   INR 1.07  --   --   --   --   --   --   --   --   HEPARINUNFRC  --   --   --    < >  --  0.41 0.45 0.35  --   CREATININE 2.41*  --   --   --  2.37*  --   --   --  2.47*  TROPONINI  --  0.05* <0.03  --  0.18*  --   --   --   --    < > = values in this interval not displayed.    Estimated Creatinine Clearance: 29.2 mL/min (A) (by C-G formula based on SCr of 2.47 mg/dL (H)).  Assessment:   76 yr old male continues on IV heparin for ACS/chest pain.    Heparin level remains therapeutic (0.35) on 1400 units/hr. No drop in platelet count.   No chest pain reported. On IV Nitroglycerin.    Goal of Therapy:  Heparin level 0.3-0.7 units/ml Monitor platelets by anticoagulation protocol: Yes   Plan:   Continue heparin drip at 1400 units/hr.  Daily heparin level and CBC while on heparin.  Will follow up for possible cardiac cath today.  Arty Baumgartner, [place] Pager: [phone] 09/16/2017,10:19 AM

## 2017-09-16 NOTE — Progress Notes (Signed)
Inpatient Diabetes Program Recommendations  AACE/ADA: New Consensus Statement on Inpatient Glycemic Control (2015)  Target Ranges:  Prepandial:   less than 140 mg/dL      Peak postprandial:   less than 180 mg/dL (1-2 hours)      Critically ill patients:  140 - 180 mg/dL   Lab Results  Component Value Date   GLUCAP 235 (H) 09/16/2017   HGBA1C 9.1 (H) 09/15/2017    Review of Glycemic Control  Diabetes history: Type 2 diabetes for about 20+ years Outpatient Diabetes medications: Novolin NPH 50 units BID (lunch & supper) Novolin Regular 35 units BID (lunch & supper)  Current orders for Inpatient glycemic control: NPH 40 units BID, Novolog RESISTANT correction scale TID, Novolog 28 U BID (lunch & supper)  Inpatient Diabetes Program Recommendations:    Spoke with patient about his diabetes that was diagnosed 20+ years ago. Started on various oral agents over time. Has been on insulin for about 5 years. Takes Novolin NPH and Regular ([employer] brand) at home due to cost. HgbA1C is 9.1%.  Does not check blood sugars at home due to frustration with control.  Has seen Dr. Debbora Presto in the past. Talked to him about importance of good blood sugar control. Will order Living Well with Diabetes booklet, will have dietician to talk with him, and would probably benefit from outpatient education at [facility]. Seemed interested in the [facility] continuous blood glucose monitor that can be prescribed by physician. Will continue to monitor blood sugars while in the hospital.  Harvel Ricks RN BSN CDE Diabetes Coordinator Pager: [phone]  8am-5pm

## 2017-09-16 NOTE — Progress Notes (Addendum)
Subjective:  No chest pain Breathing stable. Patient got emotional when he found out he may be in the hospital on Thanksgiving day.   Objective:  Vital Signs in the last 24 hours: Temp:  [97.8 F (36.6 C)-98.4 F (36.9 C)] 97.8 F (36.6 C) (11/19 0900) Pulse Rate:  [79-93] 92 (11/19 1351) Resp:  [16-21] 18 (11/19 1351) BP: (113-162)/(64-92) 113/64 (11/19 0900) SpO2:  [90 %-97 %] 92 % (11/19 1351)  Intake/Output from previous day: 11/18 0701 - 11/19 0700 In: 1488 [P.O.:120; I.V.:1368] Out: 1750 [Urine:1750] Intake/Output from this shift: No intake/output data recorded.  Physical Exam: HENT:  Head: Normocephalic and atraumatic.  Eyes: Conjunctivae and EOM are normal.  Neck: Short neck and unable to make out JVD Cardiovascular: Normal S1. Soft S2. III/VI crescendo murmur aortic area, mitral area. Vascular exam: Bilateral carotid bruit present. Femoral pulse difficult to feel due to obesity. Left popliteal normal and right faint. Absent pedal pulses bilateral Trace b/l edema Respiratory: No stridor. He has right basilar crackles.  GI: Soft.  Ventral hernia noted. BS present in all 4 quadrants Musculoskeletal: Normal range of motion. He exhibits no edema.  Lymphadenopathy: He has no cervical adenopathy.  Neurological: He is alert and oriented to person, place, and time.  Skin: Skin is warm and dry.      Lab Results: Recent Labs    09/15/17 0522 09/16/17 0320  WBC 11.3* 8.9  HGB 14.7 15.9  PLT 137* 141*   Recent Labs    09/15/17 0522 09/16/17 0551  NA 139 139  K 4.3 4.4  CL 106 101  CO2 24 28  GLUCOSE 220* 243*  BUN 36* 34*  CREATININE 2.37* 2.47*   Recent Labs    09/14/17 2245 09/15/17 0522  TROPONINI <0.03 0.18*   Hepatic Function Panel Recent Labs    09/15/17 0522  PROT 6.2*  ALBUMIN 3.2*  AST 12*  ALT 13*  ALKPHOS 78  BILITOT 0.6   Recent Labs    09/15/17 0255  CHOL 135   No results for input(s): PROTIME in the last 72  hours.  Imaging: CXR 09/14/2017 CLINICAL DATA:  Chest pain  EXAM: PORTABLE CHEST 1 VIEW  COMPARISON:  12/26/2013  FINDINGS: Cardiomegaly with interstitial coarsening diffusely. Vascular pedicle widening. Status post CABG. Remote right rib fractures. No effusion or pneumothorax.  IMPRESSION: CHF pattern, mild.   Electronically Signed   By: Monte Fantasia M.D.   On: 09/14/2017 16:45  Cardiac Studies: Echocardiogram 09/15/2017: Study Conclusions  - Left ventricle: Wall thickness was increased in a pattern of   severe LVH. There was concentric hypertrophy. The estimated   ejection fraction was in the range of 55% to 60%. Septal wall   dyskinesis. Doppler parameters are consistent with grade II   diastolic dysfunction, elevated LAP. - Aortic valve: Severely calcified annulus. Severely calcified   leaflets. Cusp separation was reduced. There was severe stenosis.   Valve area (Vmax): 0.9 cm^2. Vmax 4.1 cm, Mean PG 41 mmHg. - Mitral valve: Moderately calcified annulus. Moderately calcified   leaflets with restricted movement of posterior mitral valve   leaflet. The findings are consistent with trivial stenosis. - Left atrium: The atrium was moderately dilated. - Right ventricle: The cavity size was mildly dilated. - Right atrium: The atrium was mildly dilated.  Impressions:  - Preserved LV systolic function s/p CABG. Grade II diastolic   dysfunction, elevated LAP. Severe aortic stenosis. Trace calcific   mitral stenosis. Biatrial enlargement. Mild RV dilatation with  preserved systolic function.  Assessment:  NSTEMI: Type 1 vs type 2 in the setting of severe AS Severe AS CAD s/po CABG 2002 Hypertension Uncontrolled type 2 DM AKI/CKD Hyperlipidemia B/l carotid bruit  Plan: Medical management for NSTEMI. ASA/brilinta. Continue IV heparin. Crestor 80 mg. Cath on hold due to AKI/CKD. Relatively euvolumic today. Hold lasix. Continue metoprolol succinate 50  mg. Add Imdur 30 mg.  High STS risk for redo CABG and AVR. Also, patient does not want redo CABG. Plan to cath if and when Cr improves. Will get nephrology on board, especially in view of his CIN risk with possible coronary angiograpy and angioplasty, as well as potential TAVR workup in the [name] future with CTA.   LOS: 2 days    Cameron [name] Cameron [name] 09/16/2017, 2:22 PM  [place], MD [facility] Pager: [phone] Office: [phone] If no answer Cell [phone]

## 2017-09-17 ENCOUNTER — Other Ambulatory Visit: Payer: Self-pay

## 2017-09-17 ENCOUNTER — Inpatient Hospital Stay (HOSPITAL_COMMUNITY): Payer: Medicare Other

## 2017-09-17 ENCOUNTER — Encounter (HOSPITAL_COMMUNITY)
Admission: EM | Disposition: A | Discharge status: Home / Self Care | Payer: Self-pay | Source: Home / Self Care | Attending: Cardiology

## 2017-09-17 HISTORY — PX: ULTRASOUND GUIDANCE FOR VASCULAR ACCESS: SHX6516

## 2017-09-17 HISTORY — PX: RIGHT HEART CATH AND CORONARY/GRAFT ANGIOGRAPHY: CATH118265

## 2017-09-17 LAB — URINALYSIS, ROUTINE W REFLEX MICROSCOPIC
BACTERIA UA: NONE SEEN
Bilirubin Urine: NEGATIVE
Glucose, UA: >=500 mg/dL — AB
Hgb urine dipstick: NEGATIVE
Ketones, ur: 5 mg/dL — AB
Leukocytes, UA: NEGATIVE
Nitrite: NEGATIVE
Protein, ur: 30 mg/dL — AB
SPECIFIC GRAVITY, URINE: 1.014 (ref 1.005–1.030)
pH: 5 (ref 5.0–8.0)

## 2017-09-17 LAB — BASIC METABOLIC PANEL
Anion gap: 10 (ref 5–15)
BUN: 37 mg/dL — ABNORMAL HIGH (ref 6–20)
CALCIUM: 8.7 mg/dL — AB (ref 8.9–10.3)
CO2: 24 mmol/L (ref 22–32)
CREATININE: 2.25 mg/dL — AB (ref 0.61–1.24)
Chloride: 106 mmol/L (ref 101–111)
GFR, EST AFRICAN AMERICAN: 31 mL/min — AB (ref >60)
GFR, EST NON AFRICAN AMERICAN: 26 mL/min — AB (ref >60)
Glucose, Bld: 147 mg/dL — ABNORMAL HIGH (ref 65–99)
Potassium: 4.1 mmol/L (ref 3.5–5.1)
Sodium: 140 mmol/L (ref 135–145)

## 2017-09-17 LAB — HEPARIN LEVEL (UNFRACTIONATED)
HEPARIN UNFRACTIONATED: 0.17 [IU]/mL — AB (ref 0.30–0.70)
Heparin Unfractionated: 0.21 IU/mL — ABNORMAL LOW (ref 0.30–0.70)

## 2017-09-17 LAB — GLUCOSE, CAPILLARY
GLUCOSE-CAPILLARY: 136 mg/dL — AB (ref 65–99)
GLUCOSE-CAPILLARY: 152 mg/dL — AB (ref 65–99)
GLUCOSE-CAPILLARY: 183 mg/dL — AB (ref 65–99)
GLUCOSE-CAPILLARY: 249 mg/dL — AB (ref 65–99)
Glucose-Capillary: 181 mg/dL — ABNORMAL HIGH (ref 65–99)
Glucose-Capillary: 117 mg/dL — ABNORMAL HIGH (ref 65–99)

## 2017-09-17 LAB — CBC
HEMATOCRIT: 47.7 % (ref 39.0–52.0)
Hemoglobin: 15.8 g/dL (ref 13.0–17.0)
MCH: 29.3 pg (ref 26.0–34.0)
MCHC: 33.1 g/dL (ref 30.0–36.0)
MCV: 88.5 fL (ref 78.0–100.0)
Platelets: 137 10*3/uL — ABNORMAL LOW (ref 150–400)
RBC: 5.39 MIL/uL (ref 4.22–5.81)
RDW: 14.8 % (ref 11.5–15.5)
WBC: 7.9 10*3/uL (ref 4.0–10.5)

## 2017-09-17 LAB — CREATININE, SERUM
Creatinine, Ser: 2.14 mg/dL — ABNORMAL HIGH (ref 0.61–1.24)
GFR calc Af Amer: 33 mL/min — ABNORMAL LOW (ref >60)
GFR, EST NON AFRICAN AMERICAN: 28 mL/min — AB (ref >60)

## 2017-09-17 LAB — BRAIN NATRIURETIC PEPTIDE: B Natriuretic Peptide: 223.5 pg/mL — ABNORMAL HIGH (ref 0.0–100.0)

## 2017-09-17 LAB — PROTEIN / CREATININE RATIO, URINE
CREATININE, URINE: 86.38 mg/dL
PROTEIN CREATININE RATIO: 0.93 mg/mg{creat} — AB (ref 0.00–0.15)
TOTAL PROTEIN, URINE: 80 mg/dL

## 2017-09-17 LAB — BUN: BUN: 35 mg/dL — ABNORMAL HIGH (ref 6–20)

## 2017-09-17 LAB — TRIGLYCERIDES: TRIGLYCERIDES: 167 mg/dL — AB (ref <150)

## 2017-09-17 SURGERY — ULTRASOUND GUIDANCE, FOR VASCULAR ACCESS

## 2017-09-17 MED ORDER — SODIUM CHLORIDE 0.9% FLUSH: 3.0000 mL | INTRAVENOUS | Status: DC | PRN

## 2017-09-17 MED ORDER — FENTANYL CITRATE (PF) 100 MCG/2ML IJ SOLN
INTRAMUSCULAR | Status: AC
  Filled 2017-09-17: qty 2

## 2017-09-17 MED ORDER — SODIUM CHLORIDE 0.9% FLUSH
3.0000 mL | Freq: Two times a day (BID) | INTRAVENOUS | Status: DC
  Administered 2017-09-17: 3 mL via INTRAVENOUS

## 2017-09-17 MED ORDER — VERAPAMIL HCL 2.5 MG/ML IV SOLN
INTRAVENOUS | Status: AC
  Filled 2017-09-17: qty 2

## 2017-09-17 MED ORDER — ISOSORBIDE MONONITRATE ER 30 MG PO TB24
30.0000 mg | ORAL_TABLET | Freq: Once | ORAL | Status: AC
  Administered 2017-09-17: 30 mg via ORAL
  Filled 2017-09-17: qty 1

## 2017-09-17 MED ORDER — HEPARIN (PORCINE) IN NACL 2-0.9 UNIT/ML-% IJ SOLN
INTRAMUSCULAR | Status: DC | PRN
  Administered 2017-09-17: 10 mL via INTRA_ARTERIAL

## 2017-09-17 MED ORDER — FENTANYL CITRATE (PF) 100 MCG/2ML IJ SOLN
INTRAMUSCULAR | Status: DC | PRN
Start: 2017-09-17 — End: 2017-09-17
  Administered 2017-09-17 (×2): 25 ug via INTRAVENOUS

## 2017-09-17 MED ORDER — HEPARIN (PORCINE) IN NACL 2-0.9 UNIT/ML-% IJ SOLN
INTRAMUSCULAR | Status: AC | PRN
  Administered 2017-09-17: 1500 mL

## 2017-09-17 MED ORDER — SODIUM CHLORIDE 0.9 % WEIGHT BASED INFUSION
1.0000 mL/kg/h | INTRAVENOUS | Status: DC
  Administered 2017-09-17: 1 mL/kg/h via INTRAVENOUS

## 2017-09-17 MED ORDER — HYDRALAZINE HCL 20 MG/ML IJ SOLN
10.0000 mg | Freq: Four times a day (QID) | INTRAMUSCULAR | Status: DC | PRN
  Administered 2017-09-17: 10 mg via INTRAVENOUS
  Filled 2017-09-17 (×2): qty 1

## 2017-09-17 MED ORDER — DIPHENHYDRAMINE HCL 50 MG/ML IJ SOLN
INTRAMUSCULAR | Status: AC
  Filled 2017-09-17: qty 1

## 2017-09-17 MED ORDER — ASPIRIN 81 MG PO CHEW: 81.0000 mg | CHEWABLE_TABLET | ORAL | Status: DC

## 2017-09-17 MED ORDER — LIDOCAINE HCL (PF) 1 % IJ SOLN
INTRAMUSCULAR | Status: DC | PRN
  Administered 2017-09-17: 10 mL
  Administered 2017-09-17: 2 mL
  Administered 2017-09-17: 13 mL
  Administered 2017-09-17: 1 mL

## 2017-09-17 MED ORDER — IOPAMIDOL (ISOVUE-370) INJECTION 76%
INTRAVENOUS | Status: AC
  Filled 2017-09-17: qty 50

## 2017-09-17 MED ORDER — SODIUM CHLORIDE 0.9% FLUSH
3.0000 mL | Freq: Two times a day (BID) | INTRAVENOUS | Status: DC
  Administered 2017-09-18 – 2017-09-23 (×4): 3 mL via INTRAVENOUS

## 2017-09-17 MED ORDER — NITROGLYCERIN 1 MG/10 ML FOR IR/CATH LAB
INTRA_ARTERIAL | Status: AC
  Filled 2017-09-17: qty 10

## 2017-09-17 MED ORDER — SODIUM CHLORIDE 0.9 % IV SOLN: 250.0000 mL | INTRAVENOUS | Status: DC | PRN

## 2017-09-17 MED ORDER — SODIUM CHLORIDE 0.9 % WEIGHT BASED INFUSION: 3.0000 mL/kg/h | INTRAVENOUS | Status: DC

## 2017-09-17 MED ORDER — IOPAMIDOL (ISOVUE-370) INJECTION 76%
INTRAVENOUS | Status: DC | PRN
  Administered 2017-09-17: 125 mL via INTRA_ARTERIAL

## 2017-09-17 MED ORDER — MIDAZOLAM HCL 2 MG/2ML IJ SOLN
INTRAMUSCULAR | Status: DC | PRN
  Administered 2017-09-17 (×2): 1 mg via INTRAVENOUS

## 2017-09-17 MED ORDER — SODIUM CHLORIDE 0.9 % WEIGHT BASED INFUSION: 1.0000 mL/kg/h | INTRAVENOUS | Status: DC

## 2017-09-17 MED ORDER — INSULIN ASPART 100 UNIT/ML ~~LOC~~ SOLN: 28.0000 [IU] | Freq: Two times a day (BID) | SUBCUTANEOUS | Status: DC

## 2017-09-17 MED ORDER — LIDOCAINE HCL (PF) 1 % IJ SOLN
INTRAMUSCULAR | Status: AC
  Filled 2017-09-17: qty 30

## 2017-09-17 MED ORDER — ISOSORBIDE MONONITRATE ER 60 MG PO TB24
60.0000 mg | ORAL_TABLET | Freq: Every day | ORAL | Status: DC
  Administered 2017-09-18 – 2017-09-25 (×8): 60 mg via ORAL
  Filled 2017-09-17 (×9): qty 1

## 2017-09-17 MED ORDER — DIPHENHYDRAMINE HCL 50 MG/ML IJ SOLN
INTRAMUSCULAR | Status: DC | PRN
  Administered 2017-09-17: 12.5 mg via INTRAVENOUS

## 2017-09-17 MED ORDER — HEPARIN SODIUM (PORCINE) 1000 UNIT/ML IJ SOLN
INTRAMUSCULAR | Status: AC
Start: 2017-09-17 — End: 2017-09-17
  Filled 2017-09-17: qty 1

## 2017-09-17 MED ORDER — HEPARIN SODIUM (PORCINE) 1000 UNIT/ML IJ SOLN
INTRAMUSCULAR | Status: DC | PRN
  Administered 2017-09-17: 2000 [IU] via INTRAVENOUS
  Administered 2017-09-17: 4000 [IU] via INTRAVENOUS

## 2017-09-17 MED ORDER — MIDAZOLAM HCL 2 MG/2ML IJ SOLN
INTRAMUSCULAR | Status: AC
  Filled 2017-09-17: qty 2

## 2017-09-17 MED ORDER — IOPAMIDOL (ISOVUE-370) INJECTION 76%
INTRAVENOUS | Status: AC
  Filled 2017-09-17: qty 100

## 2017-09-17 MED ORDER — HEPARIN (PORCINE) IN NACL 2-0.9 UNIT/ML-% IJ SOLN
INTRAMUSCULAR | Status: AC
  Filled 2017-09-17: qty 1000

## 2017-09-17 MED ORDER — INSULIN NPH (HUMAN) (ISOPHANE) 100 UNIT/ML ~~LOC~~ SUSP
45.0000 [IU] | Freq: Two times a day (BID) | SUBCUTANEOUS | Status: DC
  Administered 2017-09-17 – 2017-09-19 (×4): 45 [IU] via SUBCUTANEOUS
  Filled 2017-09-17: qty 10

## 2017-09-17 MED ORDER — SODIUM CHLORIDE 0.9 % WEIGHT BASED INFUSION: 3.0000 mL/kg/h | INTRAVENOUS | Status: AC

## 2017-09-17 MED ORDER — IPRATROPIUM BROMIDE 0.02 % IN SOLN: 2.5000 mL | Freq: Four times a day (QID) | RESPIRATORY_TRACT | Status: DC | PRN

## 2017-09-17 SURGICAL SUPPLY — 27 items
CATH BALLN WEDGE 5F 110CM (CATHETERS) ×2 IMPLANT
CATH INFINITI 5 FR IM (CATHETERS) ×2 IMPLANT
CATH INFINITI 5 FR LCB (CATHETERS) ×2 IMPLANT
CATH INFINITI 5FR MULTPACK ANG (CATHETERS) ×2 IMPLANT
CATH INFINITI 6F AR1 (CATHETERS) ×2 IMPLANT
CATH LAUNCHER 6FR 3DRC (CATHETERS) IMPLANT
CATH SWAN GANZ 7F STRAIGHT (CATHETERS) ×2 IMPLANT
CATHETER LAUNCHER 6FR 3DRC (CATHETERS) ×4
COVER PRB 48X5XTLSCP FOLD TPE (BAG) IMPLANT
COVER PROBE 5X48 (BAG) ×8
DEVICE RAD COMP TR BAND LRG (VASCULAR PRODUCTS) ×2 IMPLANT
GLIDESHEATH SLEND A-KIT 6F 22G (SHEATH) ×2 IMPLANT
GUIDEWIRE .025 260CM (WIRE) ×2 IMPLANT
GUIDEWIRE INQWIRE 1.5J.035X260 (WIRE) IMPLANT
INQWIRE 1.5J .035X260CM (WIRE) ×4
KIT HEART LEFT (KITS) ×4 IMPLANT
KIT MICROINTRODUCER STIFF 5F (SHEATH) ×2 IMPLANT
PACK CARDIAC CATHETERIZATION (CUSTOM PROCEDURE TRAY) ×4 IMPLANT
SHEATH GLIDE SLENDER 4/5FR (SHEATH) ×2 IMPLANT
SHEATH PINNACLE 5F 10CM (SHEATH) ×2 IMPLANT
SHEATH PINNACLE 6F 10CM (SHEATH) ×2 IMPLANT
SHEATH PINNACLE 7F 10CM (SHEATH) ×2 IMPLANT
TRANSDUCER W/STOPCOCK (MISCELLANEOUS) ×4 IMPLANT
TUBING CIL FLEX 10 FLL-RA (TUBING) ×4 IMPLANT
WIRE EMERALD 3MM-J .035X150CM (WIRE) ×2 IMPLANT
WIRE HI TORQ VERSACORE-J 145CM (WIRE) ×2 IMPLANT
WIRE MICROINTRODUCER 60CM (WIRE) ×2 IMPLANT

## 2017-09-17 NOTE — Progress Notes (Signed)
ANTICOAGULATION CONSULT NOTE - Follow Up Consult  Pharmacy Consult for Heparin Indication: chest pain/ACS  Allergies  Allergen Reactions  . Cefuroxime Diarrhea  . Ezetimibe Diarrhea  . Pravastatin Other (See Comments)    myalgias    Patient Measurements: Height: 5\' 9"  (175.3 cm) Weight: 220 lb (99.8 kg) IBW/kg (Calculated) : 70.7 Heparin Dosing Weight: 92 kg  Vital Signs: Temp: 97.4 F (36.3 C) (11/19 2000) Temp Source: Oral (11/19 2000) BP: 164/76 (11/20 0001) Pulse Rate: 75 (11/20 0001)  Labs: Recent Labs    09/14/17 1618 09/14/17 1801 09/14/17 2245  09/15/17 0522  09/15/17 2020 09/16/17 0320 09/16/17 0551 09/17/17 0241  HGB 16.4  --   --   --  14.7  --   --  15.9  --  15.8  HCT 48.2  --   --   --  43.7  --   --  47.4  --  47.7  PLT 132*  --   --   --  137*  --   --  141*  --  137*  LABPROT 13.8  --   --   --   --   --   --   --   --   --   INR 1.07  --   --   --   --   --   --   --   --   --   HEPARINUNFRC  --   --   --    < >  --    < > 0.45 0.35  --  0.17*  CREATININE 2.41*  --   --   --  2.37*  --   --   --  2.47* 2.25*  TROPONINI  --  0.05* <0.03  --  0.18*  --   --   --   --   --    < > = values in this interval not displayed.    Estimated Creatinine Clearance: 32 mL/min (A) (by C-G formula based on SCr of 2.25 mg/dL (H)).  Assessment: 76 yr old male continues on IV heparin for ACS/chest pain.  Heparin level subtherapeutic at 0.17. RN reports IV has occluded a few times and is unsure how long the gtt was off each time. CBC stable and no bleeding per RN.   Will increase gtt conservatively given possible low level secondary to IV occlusions.   Goal of Therapy:  Heparin level 0.3-0.7 units/ml Monitor platelets by anticoagulation protocol: Yes   Plan:  Increase heparin gtt to 1600 units/hr Heparin level in 8 hrs Daily heparin level and CBC Monitor for s/s bleeding  Lavonda Jumbo, PharmD Clinical Pharmacist 09/17/17 3:45 AM

## 2017-09-17 NOTE — Progress Notes (Signed)
Pt has been having chest pain on and off tonight.  An EKG was performed. Nitro tabs given three separate times.  BP also went up to 181/90.  Hospitalist was informed and ordered hydralazine 10 mg IV Q 6 hours as needed for SBP > 170.  Lupita Dawn, RN

## 2017-09-17 NOTE — Progress Notes (Signed)
ANTICOAGULATION CONSULT NOTE - Follow Up Consult  Pharmacy Consult for Heparin Indication: chest pain/ACS  Allergies  Allergen Reactions  . Cefuroxime Diarrhea  . Ezetimibe Diarrhea  . Pravastatin Other (See Comments)    myalgias    Patient Measurements: Height: 5\' 9"  (175.3 cm) Weight: 219 lb 2.2 oz (99.4 kg) IBW/kg (Calculated) : 70.7 Heparin Dosing Weight: 92 kg  Vital Signs: Temp: 98.2 F (36.8 C) (11/20 0339) Temp Source: Oral (11/20 0339) BP: 146/73 (11/20 0952) Pulse Rate: 84 (11/20 1007)  Labs: Recent Labs    09/14/17 1618 09/14/17 1801 09/14/17 2245  09/15/17 0522  09/16/17 0320 09/16/17 0551 09/17/17 0241 09/17/17 1113  HGB 16.4  --   --   --  14.7  --  15.9  --  15.8  --   HCT 48.2  --   --   --  43.7  --  47.4  --  47.7  --   PLT 132*  --   --   --  137*  --  141*  --  137*  --   LABPROT 13.8  --   --   --   --   --   --   --   --   --   INR 1.07  --   --   --   --   --   --   --   --   --   HEPARINUNFRC  --   --   --    < >  --    < > 0.35  --  0.17* 0.21*  CREATININE 2.41*  --   --   --  2.37*  --   --  2.47* 2.25* 2.14*  TROPONINI  --  0.05* <0.03  --  0.18*  --   --   --   --   --    < > = values in this interval not displayed.    Estimated Creatinine Clearance: 33.6 mL/min (A) (by C-G formula based on SCr of 2.14 mg/dL (H)).  Assessment: 76 yr old male continues on IV heparin for ACS/chest pain.  Heparin level remains subtherapeutic at 0.21. SCr stable at 2.14. CBC stable. Per RN, patient is experiencing some bleeding from his nose and has stopped the IV heparin. LHC scheduled for 11/26.     Goal of Therapy:  Heparin level 0.3-0.7 units/ml Monitor platelets by anticoagulation protocol: Yes   Plan:  F/u MD plans on when to resume IV hepairn  Daily heparin level and CBC Monitor for s/s bleeding  Albertina Parr, PharmD., BCPS Clinical Pharmacist Pager [phone]

## 2017-09-17 NOTE — Progress Notes (Signed)
PROGRESS NOTE    Cameron [name]  LGX:[id number] DOB: January 28, 1941 DOA: 09/14/2017 PCP: Parke Poisson, MD   Brief Narrative: Cameron [name] is a 76 y.o. male with a history of CAD status post CABG, diabetes mellitus, chronic cough syndrome. He presented with chest pain and admitted for unstable angina. Glucose elevated to 300s on admission. No evidence of DKA. Cardiology planning to perform heart catheterization on Monday. Patient on heparin drip.   Assessment & Plan:   Active Problems:   Insulin dependent type 2 diabetes mellitus, uncontrolled (HCC)   Unstable angina (HCC)   CKD (chronic kidney disease) stage 3, GFR 30-59 ml/min (HCC)   Insulin dependent diabetes mellitus Uncontrolled. Not given insulin as ordered yesterday. BMP does not suggest DKA. Hemoglobin A1C of 9.1% -Hold home regimen at reduced dose of NPH 45 units BID (with lunch and dinner) and Regular 28 units BID (with lunch and dinner) while NPO -Resistant sliding scale insulin -CBG q4 hours while NPO, resume qAC & qHS when diet has been placed  Chronic cough Patient states he is unaware of this diagnosis. Per Dr. Annamaria Boots, patient has COPD vs upper airway cough syndrome. Patient not on therapy. Stable.  Chest pain Unstable angina Plan for heart cath on 09/17/2017. No recurrent chest pain since admission. On heparin drip. Had an episode of chest pain overnight that responded to nitroglycerin -Plan per primary  CAD Patient is s/p 6 vessel CABG. On Imdur and Crestor as an outpatient. -Per primary  Acute kidney injury on CKD stage III Baseline creatinine of 2.0 per discussion with cardiologist.  Last creatinine on file of 2.08 was in 2015 in [facility] records. Creatinine improved. -Nephrology recommendations  Kidney cysts Radiology recommending outpatient pre-/postcontrast CT or MRI   DVT prophylaxis: Heparin drip Code Status: Full code Family Communication: None at bedside Disposition Plan: Per  primary   Procedures:   None  Antimicrobials:  None    Subjective: Chest pain overnight that was described as tightness and pressure that was relieved by nitroglycerin.  Objective: Vitals:   09/16/17 2000 09/17/17 0001 09/17/17 0339 09/17/17 0500  BP: (!) 141/74 (!) 164/76 (!) 170/95   Pulse: 79 75 79 77  Resp: 16 19 20  (!) 24  Temp: (!) 97.4 F (36.3 C)  98.2 F (36.8 C)   TempSrc: Oral  Oral   SpO2: 91% 91% 96% 95%  Weight:    99.4 kg (219 lb 2.2 oz)  Height:        Intake/Output Summary (Last 24 hours) at 09/17/2017 0948 Last data filed at 09/17/2017 0700 Gross per 24 hour  Intake 1641.6 ml  Output 1275 ml  Net 366.6 ml   Filed Weights   09/14/17 1611 09/17/17 0500  Weight: 99.8 kg (220 lb) 99.4 kg (219 lb 2.2 oz)    Examination:  General exam: Appears calm and comfortable Respiratory system: Clear to auscultation bilaterally. Unlabored work of breathing. No wheezing or rales. Cardiovascular system: S1 & S2 heard, RRR. 2/6 systolic murmur. Gastrointestinal system: Soft, non-tender, non-distended, no guarding, no rebound, no masses felt Central nervous system: Alert and oriented. No focal neurological deficits. Extremities: No edema. No calf tenderness Skin: No cyanosis. No rashes Psychiatry: Judgement and insight appear normal. Mood & affect appropriate.     Data Reviewed: I have personally reviewed following labs and imaging studies  CBC: Recent Labs  Lab 09/14/17 1618 09/15/17 0522 09/16/17 0320 09/17/17 0241  WBC 7.0 11.3* 8.9 7.9  NEUTROABS 4.0  --   --   --  HGB 16.4 14.7 15.9 15.8  HCT 48.2 43.7 47.4 47.7  MCV 85.8 85.9 87.3 88.5  PLT 132* 137* 141* 778*   Basic Metabolic Panel: Recent Labs  Lab 09/14/17 1618 09/15/17 0522 09/16/17 0551 09/17/17 0241  NA 137 139 139 140  K 4.3 4.3 4.4 4.1  CL 104 106 101 106  CO2 26 24 28 24   GLUCOSE 351* 220* 243* 147*  BUN 36* 36* 34* 37*  CREATININE 2.41* 2.37* 2.47* 2.25*  CALCIUM 9.0  8.5* 8.9 8.7*   GFR: Estimated Creatinine Clearance: 32 mL/min (A) (by C-G formula based on SCr of 2.25 mg/dL (H)). Liver Function Tests: Recent Labs  Lab 09/14/17 1618 09/15/17 0522  AST 14* 12*  ALT 17 13*  ALKPHOS 92 78  BILITOT 0.5 0.6  PROT 7.2 6.2*  ALBUMIN 3.5 3.2*   No results for input(s): LIPASE, AMYLASE in the last 168 hours. No results for input(s): AMMONIA in the last 168 hours. Coagulation Profile: Recent Labs  Lab 09/14/17 1618  INR 1.07   Cardiac Enzymes: Recent Labs  Lab 09/14/17 1801 09/14/17 2245 09/15/17 0522  TROPONINI 0.05* <0.03 0.18*   BNP (last 3 results) No results for input(s): PROBNP in the last 8760 hours. HbA1C: Recent Labs    09/15/17 0255  HGBA1C 9.1*   CBG: Recent Labs  Lab 09/16/17 1626 09/16/17 2031 09/16/17 2344 09/17/17 0345 09/17/17 0849  GLUCAP 189* 134* 121* 181* 183*   Lipid Profile: Recent Labs    09/14/17 2245 09/15/17 0255  CHOL  --  135  HDL  --  28*  LDLCALC  --  73  TRIG 155* 171*  CHOLHDL  --  4.8   Thyroid Function Tests: Recent Labs    09/14/17 1801  TSH 3.159   Anemia Panel: No results for input(s): VITAMINB12, FOLATE, FERRITIN, TIBC, IRON, RETICCTPCT in the last 72 hours. Sepsis Labs: No results for input(s): PROCALCITON, LATICACIDVEN in the last 168 hours.  Recent Results (from the past 240 hour(s))  MRSA PCR Screening     Status: Abnormal   Collection Time: 09/15/17  5:57 PM  Result Value Ref Range Status   MRSA by PCR POSITIVE (A) NEGATIVE Final    Comment:        The GeneXpert MRSA Assay (FDA approved for NASAL specimens only), is one component of a comprehensive MRSA colonization surveillance program. It is not intended to diagnose MRSA infection nor to guide or monitor treatment for MRSA infections. RESULT CALLED TO, READ BACK BY AND VERIFIED WITH: Hervey Ard 1947 09/15/2017 Mena Goes          Radiology Studies: US Renal  Result Date: 09/17/2017 CLINICAL DATA:   Acute kidney injury. Diabetes. Atrial fibrillation. EXAM: RENAL / URINARY TRACT ULTRASOUND COMPLETE COMPARISON:  None. FINDINGS: Right Kidney: Length: 9.7 cm. Mild renal cortical thinning with normal echogenicity. An upper pole right renal lesion measures 3.1 x 2.5 x 3.2 cm. This is primarily cystic but may have minimal complexity within. Left Kidney: Length: 12.4 cm. Mild renal cortical thinning. Normal echogenicity. An upper pole left renal lesion measures 3.7 x 3.4 x 4.6 cm. This is hypoechoic with suggestion of enhanced through transmission. A lower pole exophytic 4.3 x 3.8 x 4.4 cm left renal lesion is more simple in appearance. Bladder: Appears normal for degree of bladder distention. IMPRESSION: 1.  No hydronephrosis. 2. Mild renal cortical thinning bilaterally. 3. Bilateral renal lesions. Most likely cysts and complex cysts. Upper pole renal lesions bilaterally are indeterminate. Consider  outpatient pre and post contrast abdominal MRI or CT. Electronically Signed   By: Abigail Miyamoto M.D.   On: 09/17/2017 09:33        Scheduled Meds: . [START ON 09/18/2017] aspirin  81 mg Oral Pre-Cath  . aspirin EC  81 mg Oral Daily  . Chlorhexidine Gluconate Cloth  6 each Topical Q0600  . insulin aspart  0-20 Units Subcutaneous TID WC  . insulin NPH Human  45 Units Subcutaneous BID AC & HS   And  . insulin aspart  28 Units Subcutaneous BID AC  . ipratropium  2.5 mL Inhalation TID  . isosorbide mononitrate  30 mg Oral Daily  . metoprolol succinate  50 mg Oral Daily  . mupirocin ointment  1 application Nasal BID  . rosuvastatin  40 mg Oral q1800  . sodium chloride flush  3 mL Intravenous Q12H  . sodium chloride flush  3 mL Intravenous Q12H  . ticagrelor  90 mg Oral BID   Continuous Infusions: . sodium chloride    . sodium chloride    . sodium chloride 1 mL/kg/hr (09/17/17 0550)  . [START ON 09/18/2017] sodium chloride     Followed by  . [START ON 09/18/2017] sodium chloride    . heparin 1,600  Units/hr (09/17/17 0407)     LOS: 3 days     Cordelia Poche, MD [facility] 09/17/2017, 9:48 AM Pager: [phone]  If 7PM-7AM, please contact night-coverage [email or web address] Password [facility] 09/17/2017, 9:48 AM

## 2017-09-17 NOTE — Progress Notes (Signed)
[facility] Progress Note  Went in to do consult on patient, who stated that I saw him in the office in 2008, he did not like me at all, said I was an awful doctor, did not like our office staff, and stated vehemently that he would not allow me to participate at all in his medical care. (I personally have no recollection of the encounter)  Another member of our group will see him today.   Jamal Maes, MD [facility] [phone] Pager 09/17/2017, 11:50 AM

## 2017-09-17 NOTE — Progress Notes (Signed)
No cath before noon. Patient may eat breakfast. Keep NPO after breakfast. Repeat BUN/Cr at 11:00 AM. If further improvement in Cr, will consider cath this afternoon.  Nigel Mormon, MD [facility] Pager: [phone] Office: [phone] If no answer Cell [phone]

## 2017-09-17 NOTE — H&P (View-Only) (Signed)
Repeat Cr close to baseline. Plan for RHC/LHC/coronary and bypass graft angiography. If he nesds PCI, will likely stage it and perform Monday 11/26.  Nigel Mormon, MD [facility] Pager: [phone] Office: [phone] If no answer Cell [phone]

## 2017-09-17 NOTE — Progress Notes (Signed)
Inpatient Diabetes Program Recommendations  AACE/ADA: New Consensus Statement on Inpatient Glycemic Control (2015)  Target Ranges:  Prepandial:   less than 140 mg/dL      Peak postprandial:   less than 180 mg/dL (1-2 hours)      Critically ill patients:  140 - 180 mg/dL   Lab Results  Component Value Date   GLUCAP 183 (H) 09/17/2017   HGBA1C 9.1 (H) 09/15/2017    Review of Glycemic Control  Results for Cameron [name], Cameron [name] (MRN [id number]) as of 09/17/2017 08:50  Ref. Range 09/16/2017 16:26 09/16/2017 20:31 09/16/2017 23:44 09/17/2017 03:45 09/17/2017 08:49  Glucose-Capillary Latest Ref Range: 65 - 99 mg/dL 189 (H) 134 (H) 121 (H) 181 (H) 183 (H)    Diabetes history: Type 2 Outpatient Diabetes medications: NPH 50 units bid, Novolin R 35 units with lunch and supper Current orders for Inpatient glycemic control: Novolog 0-20 units tid, NPH 45 units bid, Novolog 28 units pre-lunch and pre-supper.   Inpatient Diabetes Program Recommendations:  Please ensure patient receives his NPH and Novolog correction insulin (Novolog 0-20 units) this morning prior to procedure.    Gentry Fitz, RN, BA, MHA, CDE Diabetes Coordinator Inpatient Diabetes Program  [phone] (Team Pager) [phone] ([place]) 09/17/2017 8:53 AM

## 2017-09-17 NOTE — Progress Notes (Signed)
L radial TR band off @ 2230. No bleeding, neuro intact. Pulses present. VSS. Instructed pt to notify RN is bleeding occurs. Educated on [place] for R fem site until 1am. Will continue to monitor.  Jaymes Graff, RN

## 2017-09-17 NOTE — Interval H&P Note (Signed)
History and Physical Interval Note:  09/17/2017 4:42 PM  Cameron [name]  has presented today for surgery, with the diagnosis of cp  The various methods of treatment have been discussed with the patient and family. After consideration of risks, benefits and other options for treatment, the patient has consented to  Procedure(s): RIGHT/LEFT HEART CATH AND CORONARY/GRAFT ANGIOGRAPHY (N/A) as a surgical intervention .  The patient's history has been reviewed, patient examined, no change in status, stable for surgery.  I have reviewed the patient's chart and labs.  Questions were answered to the patient's satisfaction.    Cath Lab Visit (complete for each Cath Lab visit)  Clinical Evaluation Leading to the Procedure:   ACS: Yes.    Non-ACS:    Anginal Classification: CCS IV  Anti-ischemic medical therapy: Maximal Therapy (2 or more classes of medications)  Non-Invasive Test Results: No non-invasive testing performed  Prior CABG: Previous CABG   Cameron [name]

## 2017-09-17 NOTE — Plan of Care (Signed)
  RD consulted for nutrition education regarding diabetes.  Pt reports that he has not taken care of himself.  He does not express desire to make changes but is open to education.   Lab Results  Component Value Date   HGBA1C 9.1 (H) 09/15/2017    RD provided "Carbohydrate Counting for People with Diabetes" handout from the Academy of Nutrition and Dietetics. Discussed different food groups and their effects on blood sugar, emphasizing carbohydrate-containing foods. Provided list of carbohydrates and recommended serving sizes of common foods.  Discussed importance of controlled and consistent carbohydrate intake throughout the day. Provided examples of ways to balance meals/snacks and encouraged intake of high-fiber, whole grain complex carbohydrates. Teach back method used.  Expect poor to fair compliance.  Body mass index is 32.36 kg/m. Pt meets criteria for obesity class I based on current BMI.  Current diet order is NPO. Labs and medications reviewed. No further nutrition interventions warranted at this time. RD contact information provided. If additional nutrition issues arise, please re-consult RD.  [place], [place], [place] Pager [phone] After Hours Pager

## 2017-09-17 NOTE — Progress Notes (Signed)
Subjective:  No chest pain Breathing stable. S/p diagnostic angiogram today.  Objective:  Vital Signs in the last 24 hours: Temp:  [97.4 F (36.3 C)-98.2 F (36.8 C)] 98.2 F (36.8 C) (11/20 0339) Pulse Rate:  [0-90] 79 (11/20 1903) Resp:  [0-35] 19 (11/20 1903) BP: (126-178)/(65-95) 166/74 (11/20 1903) SpO2:  [0 %-99 %] 93 % (11/20 1903) Weight:  [99.4 kg (219 lb 2.2 oz)] 99.4 kg (219 lb 2.2 oz) (11/20 0500)  Intake/Output from previous day: 11/19 0701 - 11/20 0700 In: 1641.6 [P.O.:270; I.V.:1371.6] Out: 1275 [Urine:1275] Intake/Output from this shift: No intake/output data recorded.  Physical Exam: HENT:  Head: Normocephalic and atraumatic.  Eyes: Conjunctivae and EOM are normal.  Neck: Short neck and unable to make out JVD Cardiovascular: Normal S1. Soft S2. III/VI crescendo murmur aortic area, mitral area. Vascular exam: Bilateral carotid bruit present. Femoral pulse difficult to feel due to obesity. Left popliteal normal and right faint. Absent pedal pulses bilateral Trace b/l edema Respiratory: No stridor. He has right basilar crackles.  GI: Soft.  Ventral hernia noted. BS present in all 4 quadrants Musculoskeletal: Normal range of motion. He exhibits no edema.  Lymphadenopathy: He has no cervical adenopathy.  Neurological: He is alert and oriented to person, place, and time.  Skin: Skin is warm and dry.      Lab Results: Recent Labs    09/16/17 0320 09/17/17 0241  WBC 8.9 7.9  HGB 15.9 15.8  PLT 141* 137*   Recent Labs    09/16/17 0551 09/17/17 0241 09/17/17 1113  NA 139 140  --   K 4.4 4.1  --   CL 101 106  --   CO2 28 24  --   GLUCOSE 243* 147*  --   BUN 34* 37* 35*  CREATININE 2.47* 2.25* 2.14*   Recent Labs    09/14/17 2245 09/15/17 0522  TROPONINI <0.03 0.18*   Hepatic Function Panel Recent Labs    09/15/17 0522  PROT 6.2*  ALBUMIN 3.2*  AST 12*  ALT 13*  ALKPHOS 78  BILITOT 0.6   Recent Labs    09/15/17 0255  CHOL  135   No results for input(s): PROTIME in the last 72 hours.  Imaging: CXR 09/14/2017 CLINICAL DATA:  Chest pain  EXAM: PORTABLE CHEST 1 VIEW  COMPARISON:  12/26/2013  FINDINGS: Cardiomegaly with interstitial coarsening diffusely. Vascular pedicle widening. Status post CABG. Remote right rib fractures. No effusion or pneumothorax.  IMPRESSION: CHF pattern, mild.   Electronically Signed   By: Monte Fantasia M.D.   On: 09/14/2017 16:45  Cardiac Studies: Echocardiogram 09/15/2017: Study Conclusions  - Left ventricle: Wall thickness was increased in a pattern of   severe LVH. There was concentric hypertrophy. The estimated   ejection fraction was in the range of 55% to 60%. Septal wall   dyskinesis. Doppler parameters are consistent with grade II   diastolic dysfunction, elevated LAP. - Aortic valve: Severely calcified annulus. Severely calcified   leaflets. Cusp separation was reduced. There was severe stenosis.   Valve area (Vmax): 0.9 cm^2. Vmax 4.1 cm, Mean PG 41 mmHg. - Mitral valve: Moderately calcified annulus. Moderately calcified   leaflets with restricted movement of posterior mitral valve   leaflet. The findings are consistent with trivial stenosis. - Left atrium: The atrium was moderately dilated. - Right ventricle: The cavity size was mildly dilated. - Right atrium: The atrium was mildly dilated.  Impressions:  - Preserved LV systolic function s/p CABG. Grade II diastolic  dysfunction, elevated LAP. Severe aortic stenosis. Trace calcific   mitral stenosis. Biatrial enlargement. Mild RV dilatation with   preserved systolic function.  Diagnostic angiogram RHC/Coronary + Bypass graft 09/17/2017 Procedures   RIGHT HEART CATH AND CORONARY/GRAFT ANGIOGRAPHY  Conclusion   Severe native vessel CAD Patent grafts 4/4  (LIMA-LAD, seq SVG-PDA, PLA, seq SVG-OM1, OM2, SVG-Diag1) Severe focal 90% stenosis SVG-Diag1 WHO Grp II Pulmonary hypertension  Mean PA 32 mmhg  Recommendation: Heart team discussion regarding management of CAD and severe aortic stenosis Continue ASA/brilinta and heparin.     Assessment:  NSTEMI: Type 1 vs type 2 in the setting of severe AS Severe AS CAD s/p CABG 2002: Severe native vessel CAD. 4/4 grafts patent. 90% focal SVG-Diag stenosis Hypertension Pulmonary hypertension WHO Grp II Uncontrolled type 2 DM AKI/CKD Hyperlipidemia B/l carotid bruit  Plan: Continue medical management for NSTEMI. ASA/brilinta. Continue IV heparin. Crestor 80 mg. Will have heart team discussion regarding management of CAD and AS. Patient does not want to undergo surgery. Will discuss options for PCI + TAVR. High yield PCI that he would benefit from, is SVG-Diag 90% focal lesion. Do not think rest of the disease can be adequately treated with PCI. Needs aggressive medical management.  Continue metoprolol succinate 50 mg. Continue Imdur 60 mg.  Hospitalist and nephrology consult aprpeciated   LOS: 3 days    Valeriano Bain J Tharon Bomar 09/17/2017, 7:27 PM  Anikin Prosser Esther Hardy, MD [facility] Pager: [phone] Office: [phone] If no answer Cell [phone]

## 2017-09-17 NOTE — Progress Notes (Signed)
Repeat Cr close to baseline. Plan for RHC/LHC/coronary and bypass graft angiography. If he nesds PCI, will likely stage it and perform Monday 11/26.  Nigel Mormon, MD [facility] Pager: [phone] Office: [phone] If no answer Cell [phone]

## 2017-09-17 NOTE — Consult Note (Signed)
[name] Admit Date: 09/14/2017 09/17/2017 Rexene Agent Requesting Physician:  Einar Gip MD  Reason for Consult:  CKD3, need for cardiac catheterization HPI:  34M seen for the evaluation of the above issues.  Patient was admitted on 11/17 with accelerating angina.  Troponins have been weakly positive.    PMH Incudes:  CAD with history of CABG 2002  Type 2 diabetes  Former smoker, COPD  Hypertension  Hyperlipidemia  Patient had previously seen in our office but had not followed up in many years, apparently after an unpleasant experience he had.  He is willing to meet with me today to discuss his chronic kidney disease.  It appears that he is potentially scheduled for diagnostic cardiac catheterization today with potential revascularization in a staged procedure.  He also has been identified with severe aortic stenosis with preserved ejection fraction and there is consideration of potential future TAVR procedure.  It appears his baseline creatinine is at least 2.0 based upon values from 2014 and 2015.  His admission creatinine was 2.41, because of some dyspnea and concern for pulmonary edema he was treated with parenteral Lasix last dose given on 11/18.  He now has been prescribed normal saline for pre-cath hydration.  Renal ultrasound performed today demonstrated a 9.7 cm right kidney and 12.4 cm left kidney with bilateral cystic disease with some complex cyst noted, will follow up as an outpatient with CT or MRI was recommended.  No hydronephrosis was noted.  He denies use of nonsteroidals.  He does not use an ACE inhibitor or ARB as an outpatient.  No lower urinary tract symptoms.  Patient had some angina this morning with accompanying dyspnea.  He received nitroglycerin and this improved.  Currently he is chest pain-free without any shortness of breath.    Creatinine, Ser (mg/dL)  Date Value  09/17/2017 2.14 (H)  09/17/2017 2.25 (H)  09/16/2017 2.47 (H)  09/15/2017 2.37  (H)  09/14/2017 2.41 (H)  12/26/2013 2.08 (H)  02/22/2013 1.95 (H)  ] I/Os: I/O last 3 completed shifts: In: 3009.6 [P.O.:270; I.V.:2739.6] Out: 1625 [Urine:1625]   ROS NSAIDS: denies IV Contrast none Balance of 12 systems is negative w/ exceptions as above  PMH  Past Medical History:  Diagnosis Date  . A-fib ([place])   . Diabetes mellitus without complication ([place])   . Hypertension    PSH  Past Surgical History:  Procedure Laterality Date  . CARDIAC SURGERY    . FINGER AMPUTATION    . HERNIA REPAIR    . open heart surgery    . TOE AMPUTATION     FH  Family History  Problem Relation Age of Onset  . Heart disease Father   . Stomach cancer Paternal Grandfather   . Lung cancer Maternal Grandmother        smoked   SH  reports that he quit smoking about 38 years ago. His smoking use included cigarettes. He has a 40.00 pack-year smoking history. he has never used smokeless tobacco. He reports that he does not drink alcohol or use drugs. Allergies  Allergies  Allergen Reactions  . Cefuroxime Diarrhea  . Ezetimibe Diarrhea  . Pravastatin Other (See Comments)    myalgias   Home medications Prior to Admission medications   Medication Sig Start Date End Date Taking? Authorizing Provider  aspirin 325 MG tablet Take 325 mg daily after supper by mouth.    Yes [provider]  insulin NPH (HUMULIN N,NOVOLIN N) 100 UNIT/ML injection Inject 50 Units  See admin instructions into the skin. Inject 50 unit subcutaneously twice daily - after lunch and supper   Yes [provider]  insulin regular (NOVOLIN R,HUMULIN R) 100 units/mL injection Inject 35 Units See admin instructions into the skin. Inject 35 units subcutaneously twice daily - after lunch and supper   Yes [provider]  isosorbide mononitrate (IMDUR) 30 MG 24 hr tablet Take 30 mg See admin instructions by mouth. Take 1 tablet (30 mg) by mouth every morning, may also take a 2nd dose (30 mg) as needed  for chest pain   Yes [provider]  metoprolol succinate (TOPROL-XL) 50 MG 24 hr tablet Take 50 mg daily after supper by mouth. Take with or immediately following a meal.    Yes [provider]  nitroGLYCERIN (NITROSTAT) 0.4 MG SL tablet Place 0.4 mg every 5 (five) minutes as needed under the tongue for chest pain.   Yes [provider]  rosuvastatin (CRESTOR) 40 MG tablet Take 40 mg daily after supper by mouth.    Yes [provider]    Current Medications Scheduled Meds: . [START ON 09/18/2017] aspirin  81 mg Oral Pre-Cath  . aspirin EC  81 mg Oral Daily  . Chlorhexidine Gluconate Cloth  6 each Topical Q0600  . insulin aspart  0-20 Units Subcutaneous TID WC  . insulin NPH Human  45 Units Subcutaneous BID AC & HS   And  . insulin aspart  28 Units Subcutaneous BID AC  . isosorbide mononitrate  30 mg Oral Once  . [START ON 09/18/2017] isosorbide mononitrate  60 mg Oral Daily  . metoprolol succinate  50 mg Oral Daily  . mupirocin ointment  1 application Nasal BID  . rosuvastatin  40 mg Oral q1800  . sodium chloride flush  3 mL Intravenous Q12H  . sodium chloride flush  3 mL Intravenous Q12H  . ticagrelor  90 mg Oral BID   Continuous Infusions: . sodium chloride    . sodium chloride    . sodium chloride 1 mL/kg/hr (09/17/17 0550)  . [START ON 09/18/2017] sodium chloride     Followed by  . [START ON 09/18/2017] sodium chloride    . heparin 1,600 Units/hr (09/17/17 0407)   PRN Meds:.sodium chloride, sodium chloride, acetaminophen, calcium carbonate, hydrALAZINE, ipratropium, nitroGLYCERIN, ondansetron (ZOFRAN) IV, sodium chloride flush, sodium chloride flush, zolpidem  CBC Recent Labs  Lab 09/14/17 1618 09/15/17 0522 09/16/17 0320 09/17/17 0241  WBC 7.0 11.3* 8.9 7.9  NEUTROABS 4.0  --   --   --   HGB 16.4 14.7 15.9 15.8  HCT 48.2 43.7 47.4 47.7  MCV 85.8 85.9 87.3 88.5  PLT 132* 137* 141* 532*   Basic Metabolic Panel Recent Labs   Lab 09/14/17 1618 09/15/17 0522 09/16/17 0551 09/17/17 0241 09/17/17 1113  NA 137 139 139 140  --   K 4.3 4.3 4.4 4.1  --   CL 104 106 101 106  --   CO2 26 24 28 24   --   GLUCOSE 351* 220* 243* 147*  --   BUN 36* 36* 34* 37* 35*  CREATININE 2.41* 2.37* 2.47* 2.25* 2.14*  CALCIUM 9.0 8.5* 8.9 8.7*  --     Physical Exam  Blood pressure (!) 146/73, pulse 84, temperature 98.2 F (36.8 C), temperature source Oral, resp. rate (!) 22, height 5\' 9"  (1.753 m), weight 99.4 kg (219 lb 2.2 oz), SpO2 94 %. GEN: NAD, pleasant, obese ENT: NCAT EYES: EOMI CV: RRR, 3/6  MSM at [place]: bibasilar crackles present ABD: s/nt/nd SKIN: no rashes/lesions EXT:No LEE   Assessment 53M with ACS, severe AS, and CKD3  1. CKD3, I suspect at current baseline SCr 2. ACS, for diagnostic LHC today; furosemide held, rec gentle pre-cath hydration 3. Severe AS by TTE, to be considered for TAVR in future 4. Renal US 09/17/17 with b/l cystic disease will need outpt f/u 5. DM2 6. HTN 7. COPD / Former Smoker 36. HLD  Plan 1. Agree with holding lasix and gentle hydration; presumably multifactorial from ASCVD (? Ischemia with size discrepancy), DM2, HTN 2. Would continue hydration after LHC as well 3. UA  + UP/C 4. Will need outpt nephrology care esp to coordinate with sevre AS and TAVR  5. Hold diuretics for at lest 24h as able 6. Daily weights, Daily Renal Panel, Strict I/Os, Avoid nephrotoxins (NSAIDs, judicious IV Contrast)  7. Will follow closely   Pearson Grippe MD [phone] pgr 09/17/2017, 12:36 PM

## 2017-09-18 ENCOUNTER — Encounter (HOSPITAL_COMMUNITY): Payer: Self-pay | Admitting: Cardiology

## 2017-09-18 ENCOUNTER — Other Ambulatory Visit: Payer: Self-pay | Admitting: Physician Assistant

## 2017-09-18 DIAGNOSIS — Z794 Long term (current) use of insulin: Secondary | ICD-10-CM

## 2017-09-18 DIAGNOSIS — I350 Nonrheumatic aortic (valve) stenosis: Secondary | ICD-10-CM

## 2017-09-18 DIAGNOSIS — D696 Thrombocytopenia, unspecified: Secondary | ICD-10-CM

## 2017-09-18 DIAGNOSIS — E1165 Type 2 diabetes mellitus with hyperglycemia: Secondary | ICD-10-CM

## 2017-09-18 LAB — BASIC METABOLIC PANEL
ANION GAP: 7 (ref 5–15)
BUN: 33 mg/dL — ABNORMAL HIGH (ref 6–20)
CALCIUM: 8.6 mg/dL — AB (ref 8.9–10.3)
CHLORIDE: 108 mmol/L (ref 101–111)
CO2: 26 mmol/L (ref 22–32)
Creatinine, Ser: 2.14 mg/dL — ABNORMAL HIGH (ref 0.61–1.24)
GFR calc Af Amer: 33 mL/min — ABNORMAL LOW (ref >60)
GFR calc non Af Amer: 28 mL/min — ABNORMAL LOW (ref >60)
GLUCOSE: 107 mg/dL — AB (ref 65–99)
Potassium: 3.7 mmol/L (ref 3.5–5.1)
Sodium: 141 mmol/L (ref 135–145)

## 2017-09-18 LAB — POCT I-STAT 3, ART BLOOD GAS (G3+)
BICARBONATE: 25.9 mmol/L (ref 20.0–28.0)
O2 Saturation: 98 %
PH ART: 7.355 (ref 7.350–7.450)
PO2 ART: 103 mmHg (ref 83.0–108.0)
TCO2: 27 mmol/L (ref 22–32)
pCO2 arterial: 46.4 mmHg (ref 32.0–48.0)

## 2017-09-18 LAB — POCT I-STAT 3, VENOUS BLOOD GAS (G3P V)
Acid-Base Excess: 1 mmol/L (ref 0.0–2.0)
Bicarbonate: 27.3 mmol/L (ref 20.0–28.0)
O2 SAT: 69 %
PCO2 VEN: 51.5 mmHg (ref 44.0–60.0)
PH VEN: 7.333 (ref 7.250–7.430)
PO2 VEN: 39 mmHg (ref 32.0–45.0)
TCO2: 29 mmol/L (ref 22–32)

## 2017-09-18 LAB — BRAIN NATRIURETIC PEPTIDE: B Natriuretic Peptide: 181.4 pg/mL — ABNORMAL HIGH (ref 0.0–100.0)

## 2017-09-18 LAB — CBC
HCT: 44.5 % (ref 39.0–52.0)
Hemoglobin: 14.3 g/dL (ref 13.0–17.0)
MCH: 28.3 pg (ref 26.0–34.0)
MCHC: 32.1 g/dL (ref 30.0–36.0)
MCV: 87.9 fL (ref 78.0–100.0)
Platelets: 149 10*3/uL — ABNORMAL LOW (ref 150–400)
RBC: 5.06 MIL/uL (ref 4.22–5.81)
RDW: 14.8 % (ref 11.5–15.5)
WBC: 8.3 10*3/uL (ref 4.0–10.5)

## 2017-09-18 LAB — GLUCOSE, CAPILLARY
GLUCOSE-CAPILLARY: 120 mg/dL — AB (ref 65–99)
GLUCOSE-CAPILLARY: 89 mg/dL (ref 65–99)
GLUCOSE-CAPILLARY: 118 mg/dL — AB (ref 65–99)
Glucose-Capillary: 154 mg/dL — ABNORMAL HIGH (ref 65–99)
Glucose-Capillary: 108 mg/dL — ABNORMAL HIGH (ref 65–99)

## 2017-09-18 LAB — HEPARIN LEVEL (UNFRACTIONATED): HEPARIN UNFRACTIONATED: 0.34 [IU]/mL (ref 0.30–0.70)

## 2017-09-18 LAB — POCT ACTIVATED CLOTTING TIME: Activated Clotting Time: 197 seconds

## 2017-09-18 MED FILL — Nitroglycerin IV Soln 100 MCG/ML in D5W: INTRA_ARTERIAL | Qty: 10 | Status: AC

## 2017-09-18 NOTE — Plan of Care (Signed)
  Education: Knowledge of General Education information will improve 09/18/2017 2350 - Completed/Met by Peggye Pitt, RN   Health Behavior/Discharge Planning: Ability to manage health-related needs will improve 09/18/2017 2350 - Completed/Met by Peggye Pitt, RN   Clinical Measurements: Will remain free from infection 09/18/2017 2350 - Completed/Met by Peggye Pitt, RN

## 2017-09-18 NOTE — Progress Notes (Signed)
Subjective:  No chest pain Breathing stable. S/p diagnostic angiogram 11/20 No bleeding, hematoma  Objective:  Vital Signs in the last 24 hours: Temp:  [98 F (36.7 C)-98.3 F (36.8 C)] 98 F (36.7 C) (11/21 0356) Pulse Rate:  [0-90] 78 (11/21 0356) Resp:  [0-35] 18 (11/21 0356) BP: (126-178)/(65-88) 161/71 (11/21 0356) SpO2:  [0 %-99 %] 99 % (11/21 0356)  Intake/Output from previous day: 11/20 0701 - 11/21 0700 In: 477 [P.O.:330; I.V.:147] Out: 1350 [Urine:1350] Intake/Output from this shift: No intake/output data recorded.  Physical Exam: HENT:  Head: Normocephalic and atraumatic.  Eyes: Conjunctivae and EOM are normal.  Neck: No JVD Cardiovascular: Normal S1. Soft S2. III/VI crescendo murmur aortic area, mitral area. Vascular exam: Bilateral carotid bruit present. Femoral pulse difficult to feel due to obesity. Left popliteal normal and right faint. Absent pedal pulses bilateral Trace b/l edema Respiratory: No stridor. He has no rales GI: Soft.  Ventral hernia noted. BS present in all 4 quadrants Musculoskeletal: Normal range of motion. He exhibits no edema.  Lymphadenopathy: He has no cervical adenopathy.  Neurological: He is alert and oriented to person, place, and time.  Skin: Skin is warm and dry.      Lab Results: Recent Labs    09/17/17 0241 09/18/17 0324  WBC 7.9 8.3  HGB 15.8 14.3  PLT 137* 149*   Recent Labs    09/17/17 0241 09/17/17 1113 09/18/17 0324  NA 140  --  141  K 4.1  --  3.7  CL 106  --  108  CO2 24  --  26  GLUCOSE 147*  --  107*  BUN 37* 35* 33*  CREATININE 2.25* 2.14* 2.14*   No results for input(s): TROPONINI in the last 72 hours.  Invalid input(s): CK, MB Hepatic Function Panel No results for input(s): PROT, ALBUMIN, AST, ALT, ALKPHOS, BILITOT, BILIDIR, IBILI in the last 72 hours. No results for input(s): CHOL in the last 72 hours. No results for input(s): PROTIME in the last 72 hours.  Imaging: CXR  09/14/2017 CLINICAL DATA:  Chest pain  EXAM: PORTABLE CHEST 1 VIEW  COMPARISON:  12/26/2013  FINDINGS: Cardiomegaly with interstitial coarsening diffusely. Vascular pedicle widening. Status post CABG. Remote right rib fractures. No effusion or pneumothorax.  IMPRESSION: CHF pattern, mild.   Electronically Signed   By: Monte Fantasia M.D.   On: 09/14/2017 16:45  Cardiac Studies: Echocardiogram 09/15/2017: Study Conclusions  - Left ventricle: Wall thickness was increased in a pattern of   severe LVH. There was concentric hypertrophy. The estimated   ejection fraction was in the range of 55% to 60%. Septal wall   dyskinesis. Doppler parameters are consistent with grade II   diastolic dysfunction, elevated LAP. - Aortic valve: Severely calcified annulus. Severely calcified   leaflets. Cusp separation was reduced. There was severe stenosis.   Valve area (Vmax): 0.9 cm^2. Vmax 4.1 cm, Mean PG 41 mmHg. - Mitral valve: Moderately calcified annulus. Moderately calcified   leaflets with restricted movement of posterior mitral valve   leaflet. The findings are consistent with trivial stenosis. - Left atrium: The atrium was moderately dilated. - Right ventricle: The cavity size was mildly dilated. - Right atrium: The atrium was mildly dilated.  Impressions:  - Preserved LV systolic function s/p CABG. Grade II diastolic   dysfunction, elevated LAP. Severe aortic stenosis. Trace calcific   mitral stenosis. Biatrial enlargement. Mild RV dilatation with   preserved systolic function.  Diagnostic angiogram RHC/Coronary + Bypass graft 09/17/2017  Procedures   RIGHT HEART CATH AND CORONARY/GRAFT ANGIOGRAPHY  Conclusion   Severe native vessel CAD Patent grafts 4/4  (LIMA-LAD, seq SVG-PDA, PLA, seq SVG-OM1, OM2, SVG-Diag1) Severe focal 90% stenosis SVG-Diag1 WHO Grp II Pulmonary hypertension Mean PA 32 mmhg  Recommendation: Heart team discussion regarding management  of CAD and severe aortic stenosis Continue ASA/brilinta and heparin.     Assessment:  NSTEMI: Type 1 vs type 2 in the setting of severe AS Severe AS CAD s/p CABG 2002: Severe native vessel CAD. 4/4 grafts patent. 90% focal SVG-Diag stenosis Hypertension Pulmonary hypertension WHO Grp II Uncontrolled type 2 DM AKI/CKD: Resolving. Cr and U o/p stable since cath 11/20. Hyperlipidemia B/l carotid bruit  Plan: Continue medical management for NSTEMI. ASA/brilinta. Continue IV heparin. Crestor 80 mg. Ongoing heart team discussion regarding management of CAD and AS. Patient does not want to undergo surgery. I am currently discussing therapy options with [name]. If TAVR possible, would proceed with PCI. High yield PCI that he would benefit from, is SVG-Diag 90% focal lesion. I doo not think rest of the disease can be adequately treated with PCI. Needs aggressive medical management.  Continue metoprolol succinate 50 mg. Continue Imdur 60 mg.  Hospitalist and nephrology consult aprpeciated   LOS: 4 days    Aide Wojnar J Winfield Caba 09/18/2017, 10:11 AM  [place], MD [facility] [place] Pager: [phone] Office: [phone] If no answer Cell [phone]

## 2017-09-18 NOTE — Progress Notes (Signed)
PROGRESS NOTE  [name] MGN:[id number] DOB: 02/20/1941 DOA: 09/14/2017 PCP: Parke Poisson, MD  Brief Narrative: 52yom PMH CAD s/p CABG, presented with chest pain. Admitted by cardiology for ACS, medicine consulted for assistance with medical management of diabetes. Treated for ACS. S/p [facility] 11/20.  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 Hgb A1c 9.1 %. - CBG stable. Now eating. Continue NPH at 45 units BID (but hold this AM). Use SSI. Hold scheduled Novolog for now.  COPD, PMH tobacco use - stable  Thrombocytopenia - modest, stable, present since 2014; recommend considering outpatient evaluation. No further inpatient recommendations.  ACS, unstable angina pectoris, NSTEMI - per cardiology; ASA, Brilinta, heparin, Crestor, metoprolol, IMdur  Severe native vessel CAD by cath this admission; s/p CABG 2000 - per cardiology   Severe aortic stenosis  - per cardiology  AKI/CKD stage III with reported baseline creatinine of 2.0. - management per nephrology   Renal cysts. - radiology recommended outpt CT or MRI to further characterize.   Overall doing well. Will continue to follow DM  DVT prophylaxis: heparin infusion Code Status: full Family Communication: none Disposition Plan: home    Murray Hodgkins, MD  [facility] Direct contact: [phone] --[place]ia [place]  --[email or web address]; password [email or web address]  7PM-7AM contact night coverage as above 09/18/2017, 11:01 AM  LOS: 4 days   Consultants:  Nephrology   TRH  Procedures:  Echo  Impressions:  - Preserved LV systolic function s/p CABG. Grade II diastolic dysfunction, elevated LAP. Severe aortic stenosis. Trace calcific mitral stenosis. Biatrial enlargement. Mild RV dilatation with preserved systolic function.  R/LHC Conclusion   Severe native vessel CAD Patent grafts 4/4  (LIMA-LAD, seq SVG-PDA, PLA, seq SVG-OM1, OM2, SVG-Diag1) Severe focal 90%  stenosis SVG-Diag1 WHO Grp II Pulmonary hypertension Mean PA 32 mmhg  Recommendation: Heart team discussion regarding management of CAD and severe aortic stenosis Continue ASA/brilinta and heparin.    Antimicrobials:    Interval history/Subjective: Feels ok. Eating ok.  Objective: Vitals:  Vitals:   09/18/17 0356 09/18/17 1026  BP: (!) 161/71 (!) 168/87  Pulse: 78 83  Resp: 18   Temp: 98 F (36.7 C)   SpO2: 99%     Exam:  Constitutional:  . Appears calm and comfortable Respiratory:  . CTA bilaterally, no w/r/r.  . Respiratory effort normal.  Cardiovascular:  . RRR, 2/6 systolic murmur, no r/g . No significant LE extremity edema   Psychiatric:  . Mental status o Mood, affect appropriate  I have personally reviewed the following:  Filed Weights   09/14/17 1611 09/17/17 0500  Weight: 99.8 kg (220 lb) 99.4 kg (219 lb 2.2 oz)   Weight change:   Last BM charted: unrecorded   Labs:  CBG stable  Creatinine at baseline, BMP otherwise unremarkable  CBC stable with modest thrombocytopenia present since 2014.  Scheduled Meds: . aspirin EC  81 mg Oral Daily  . Chlorhexidine Gluconate Cloth  6 each Topical Q0600  . insulin aspart  0-20 Units Subcutaneous TID WC  . insulin NPH Human  45 Units Subcutaneous BID AC & HS  . isosorbide mononitrate  60 mg Oral Daily  . metoprolol succinate  50 mg Oral Daily  . mupirocin ointment  1 application Nasal BID  . rosuvastatin  40 mg Oral q1800  . sodium chloride flush  3 mL Intravenous Q12H  . ticagrelor  90 mg Oral BID   Continuous Infusions: . sodium chloride    . heparin Stopped (  09/17/17 1600)    Active Problems:   Insulin dependent type 2 diabetes mellitus, uncontrolled (HCC)   Unstable angina (HCC)   CKD (chronic kidney disease) stage 3, GFR 30-59 ml/min (HCC)   Thrombocytopenia (HCC)   LOS: 4 days

## 2017-09-18 NOTE — Progress Notes (Signed)
ANTICOAGULATION CONSULT NOTE - Follow Up Consult  Pharmacy Consult for Heparin Indication: chest pain/ACS  Allergies  Allergen Reactions  . Cefuroxime Diarrhea  . Ezetimibe Diarrhea  . Pravastatin Other (See Comments)    myalgias    Patient Measurements: Height: 5\' 9"  (175.3 cm) Weight: 219 lb 2.2 oz (99.4 kg) IBW/kg (Calculated) : 70.7 Heparin Dosing Weight: 92 kg  Vital Signs: Temp: 98.7 F (37.1 C) (11/21 2014) Temp Source: Oral (11/21 2014) BP: 132/83 (11/21 2014) Pulse Rate: 108 (11/21 2014)  Labs: Recent Labs    09/16/17 0320  09/17/17 0241 09/17/17 1113 09/18/17 0324 09/18/17 2030  HGB 15.9  --  15.8  --  14.3  --   HCT 47.4  --  47.7  --  44.5  --   PLT 141*  --  137*  --  149*  --   HEPARINUNFRC 0.35  --  0.17* 0.21*  --  0.34  CREATININE  --    < > 2.25* 2.14* 2.14*  --    < > = values in this interval not displayed.    Estimated Creatinine Clearance: 33.6 mL/min (A) (by C-G formula based on SCr of 2.14 mg/dL (H)).  Assessment: 76 yr old male here with ACS/chest pain s/p diagnostic angiogram on 11/20 for PCI on 11/23. He is on heparin and while awaiting PCI and is also noted with afib. -Heparin level at goal  Goal of Therapy:  Heparin level 0.3-0.7 units/ml Monitor platelets by anticoagulation protocol: Yes   Plan:  No heparin changes needed Daily heparin level and CBC  Hildred Laser, Pharm D 09/18/2017 9:38 PM

## 2017-09-18 NOTE — Consult Note (Signed)
[facility]                                       Inpatient TAVR Consultation:   Patient ID: Cameron [name]; [id number]; 15-Jul-1941   Admit date: 09/14/2017 Date of Consult: 09/18/2017  Primary Care Provider: Parke Poisson, MD Primary Cardiologist: Dr. Virgina Jock  Patient Profile:   Cameron [name] is a 76 y.o. male with a hx of CAD s/p CABG x4V (2002), CKD stage III, HTN, HLD, chronic diastolic CHF, COPD, obesity, former smoker, chronic cough syndrome, RBBB, uncontrolled DMT2 and severe AS who is being seen today for the evaluation of severe aortic stenosis at the request of Dr Virgina Jock.   History of Present Illness:   Cameron [name] lives in [place] with his wife. He has two children and two grandchildren. 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 He is a former smoker but quit in [place] (> 20 year pack year history). His son in law is a Pharmacist, community and he has regular dental work with cleanings twice a year. No history of nickel allergy. Atrial fibrillation was listed his past medical history but there was no mention of this in cardiology notes.   He has a history of CAD s/p CABG x4V with LIMA-LAD, seq SVG-PDA, PLA, seq SVG-OM1, OM2, SVG-Diag1 in 2002 by Dr. Nils Pyle. He was previously followed by Dr. Rex Kras but was lost to follow up after Dr. Rex Kras retired. He has not followed by a cardiologist until he recently established care with [facility].   Cameron [name] reports that he has been experiencing chest pain, fatigue and shortness of breath over the past year, which has become worse over the past month. It used to occur only with exertion but has recently occurred at rest. He was evaluated by Dr. Virgina Jock in the office on 09/12/17. He was scheduled for both a nuclear stress test and cardiac catheterization given renal failure for increased diagnostic yield. However, before  these could be completed he presented to the ER with worsening chest pain.  He was doing okay until the morning of 09/14/17 when he developed continuous chest pain at rest associated with weakness and fatigue. It felt like an "elephant was sitting on his chest." His wife called EMS and he was brought to [facility] ED for evaluation.  In the ED, initial cardiac enzymes were negative but BNP mildly elevated and CXR with mild CHF. Also noted to have mild thrombocytopenia. He was admitted for diuresis, ACS rule out and plans for coronary angiography. 2D ECHO completed on 09/15/17 showed EF 55-60%, G2DD, severe calcific AS (mean gradient 36 mm Hg and peak gradient 64 mm Hg, AVA 0.9, DVI 0.26), trace calcific MS, biatrial enlargement, and mild RV dilation. He eventually ruled in for NSTEMI with mildly elevated cardiac enzymes (troponin 0.05--> 0.03--> 0.18). He was treated with two doses of IV lasix which was discontinued due to worsening renal function ( creat 2.41--> 2.47). Renal US showed bilateral cystic disease which will need outpatient follow up. Nephrology has been following along. He underwent L/RHC on 09/17/17 which showed 4/4 patent grafts with severe focal 90% stenosis SVG-Diag1, mod pulm HTN and severe AS.   The multidisciplinary valve team was consulted to discuss potential staged PCI to SVG--> diag on 09/20/17 and eventual TAVR for treatment  of severe AS.   He is currently feeling much better. No chest pain currently. He has had chronic LE edema in right leg since vein harvest at bypass, but no other issues with swelling. He chronically sleeps in an arm chair because that is where he is comfortable. No PND. No dizziness or syncope. No blood in stool or urine. No palpitations.     Past Medical History:  Diagnosis Date  . CAD (coronary artery disease)    a. 2000: s/p CABG x 4V (LIMA-LAD, seq SVG-PDA, PLA, seq SVG-OM1, OM2, SVG-Diag1)  . CKD (chronic kidney disease)   . COPD (chronic obstructive  pulmonary disease) ([place])   . Diabetes mellitus without complication ([place])   . HLD (hyperlipidemia)   . Hypertension     Past Surgical History:  Procedure Laterality Date  . CARDIAC SURGERY    . FINGER AMPUTATION    . HERNIA REPAIR    . open heart surgery    . RIGHT HEART CATH AND CORONARY/GRAFT ANGIOGRAPHY N/A 09/17/2017   Procedure: RIGHT HEART CATH AND CORONARY/GRAFT ANGIOGRAPHY;  Surgeon: Nigel Mormon, MD;  Location: [place] [facility];  Service: Cardiovascular;  Laterality: N/A;  . TOE AMPUTATION    . ULTRASOUND GUIDANCE FOR VASCULAR ACCESS  09/17/2017   Procedure: Ultrasound Guidance For Vascular Access;  Surgeon: Nigel Mormon, MD;  Location: [place] [facility];  Service: Cardiovascular;;     Inpatient Medications: Scheduled Meds: . aspirin EC  81 mg Oral Daily  . Chlorhexidine Gluconate Cloth  6 each Topical Q0600  . insulin aspart  0-20 Units Subcutaneous TID WC  . insulin NPH Human  45 Units Subcutaneous BID AC & HS  . isosorbide mononitrate  60 mg Oral Daily  . metoprolol succinate  50 mg Oral Daily  . mupirocin ointment  1 application Nasal BID  . rosuvastatin  40 mg Oral q1800  . sodium chloride flush  3 mL Intravenous Q12H  . ticagrelor  90 mg Oral BID   Continuous Infusions: . sodium chloride    . heparin 1,700 Units/hr (09/18/17 1253)   PRN Meds: sodium chloride, acetaminophen, calcium carbonate, hydrALAZINE, ipratropium, nitroGLYCERIN, ondansetron (ZOFRAN) IV, sodium chloride flush, zolpidem  Allergies:    Allergies  Allergen Reactions  . Cefuroxime Diarrhea  . Ezetimibe Diarrhea  . Pravastatin Other (See Comments)    myalgias    Social History:   Social History   Socioeconomic History  . Marital status: Married    Spouse name: Not on file  . Number of children: 2  . Years of education: Not on file  . Highest education level: Not on file  Social Needs  . Financial resource strain: Not on file  . Food insecurity - worry: Not on  file  . Food insecurity - inability: Not on file  . Transportation needs - medical: Not on file  . Transportation needs - non-medical: Not on file  Occupational History  . Occupation: Doctor, general practice  Tobacco Use  . Smoking status: Former Smoker    Packs/day: 2.00    Years: 20.00    Pack years: 40.00    Types: Cigarettes    Last attempt to quit: 09/28/1978    Years since quitting: 39.0  . Smokeless tobacco: Never Used  Substance and Sexual Activity  . Alcohol use: No  . Drug use: No  . Sexual activity: Not on file  Other Topics Concern  . Not on file  Social History Narrative  . Not on file  Family History:   The patient's family history includes Heart disease in his father; Lung cancer in his maternal grandmother; Stomach cancer in his paternal grandfather.  ROS:  Please see the history of present illness.  ROS  All other ROS reviewed and negative.     Physical Exam/Data:   Vitals:   09/17/17 1926 09/17/17 2213 09/18/17 0356 09/18/17 1026  BP: 135/74 (!) 144/70 (!) 161/71 (!) 168/87  Pulse: 79  78 83  Resp: 17  18   Temp: 98.3 F (36.8 C)  98 F (36.7 C)   TempSrc: Oral  Oral   SpO2: 92%  99%   Weight:      Height:        Intake/Output Summary (Last 24 hours) at 09/18/2017 1428 Last data filed at 09/18/2017 1100 Gross per 24 hour  Intake 594 ml  Output 1300 ml  Net -706 ml   Filed Weights   09/14/17 1611 09/17/17 0500  Weight: 220 lb (99.8 kg) 219 lb 2.2 oz (99.4 kg)   Body mass index is 32.36 kg/m.  General:  Well nourished, well developed, in no acute distress, obese HEENT: normal Lymph: no adenopathy Neck: no JVD Endocrine:  No thryomegaly Vascular: bilateral carotid bruits  Cardiac:  normal S1, S2; irreg irreg, tachy, 3/6 SEM @ RUSB.  Lungs:  clear to auscultation bilaterally, no wheezing, rhonchi or rales  Abd: soft, nontender, no hepatomegaly  Ext: trace LE edema Musculoskeletal:  No deformities, BUE and BLE strength normal and  equal Skin: warm and dry  Neuro:  CNs 2-12 intact, no focal abnormalities noted Psych:  Normal affect   EKG:  The EKG was personally reviewed and demonstrates: 11/17/: sinus tachy HR 104, RBBB. 11/21 afib with RVR HR 106, RBBB Telemetry:  Telemetry was personally reviewed and demonstrates: intermittent afib with RVR vs sinus tachy with PACS  Relevant CV Studies: 2D ECHO 09/15/17 Study Conclusions - Left ventricle: Wall thickness was increased in a pattern of   severe LVH. There was concentric hypertrophy. The estimated   ejection fraction was in the range of 55% to 60%. Septal wall   dyskinesis. Doppler parameters are consistent with grade II   diastolic dysfunction, elevated LAP. - Aortic valve: Severely calcified annulus. Severely calcified   leaflets. Cusp separation was reduced. There was severe stenosis.   Valve area (Vmax): 0.9 cm^2. Vmax 4.1 cm, Mean PG 41 mmHg. - Mitral valve: Moderately calcified annulus. Moderately calcified   leaflets with restricted movement of posterior mitral valve   leaflet. The findings are consistent with trivial stenosis. - Left atrium: The atrium was moderately dilated. - Right ventricle: The cavity size was mildly dilated. - Right atrium: The atrium was mildly dilated. Impressions: - Preserved LV systolic function s/p CABG. Grade II diastolic   dysfunction, elevated LAP. Severe aortic stenosis. Trace calcific   mitral stenosis. Biatrial enlargement. Mild RV dilatation with   preserved systolic function.   [facility] 09/17/17 Conclusion  Severe native vessel CAD Patent grafts 4/4  (LIMA-LAD, seq SVG-PDA, PLA, seq SVG-OM1, OM2, SVG-Diag1) Severe focal 90% stenosis SVG-Diag1 WHO Grp II Pulmonary hypertension Mean PA 32 mmhg Recommendation: Heart team discussion regarding management of CAD and severe aortic stenosis Continue ASA/brilinta and heparin.     Laboratory Data:  Chemistry Recent Labs  Lab 09/16/17 0551 09/17/17 0241  09/17/17 1113 09/18/17 0324  NA 139 140  --  141  K 4.4 4.1  --  3.7  CL 101 106  --  108  CO2  28 24  --  26  GLUCOSE 243* 147*  --  107*  BUN 34* 37* 35* 33*  CREATININE 2.47* 2.25* 2.14* 2.14*  CALCIUM 8.9 8.7*  --  8.6*  GFRNONAA 24* 26* 28* 28*  GFRAA 27* 31* 33* 33*  ANIONGAP 10 10  --  7    Recent Labs  Lab 09/14/17 1618 09/15/17 0522  PROT 7.2 6.2*  ALBUMIN 3.5 3.2*  AST 14* 12*  ALT 17 13*  ALKPHOS 92 78  BILITOT 0.5 0.6   Hematology Recent Labs  Lab 09/16/17 0320 22-Sep-2017 0241 09/18/17 0324  WBC 8.9 7.9 8.3  RBC 5.43 5.39 5.06  HGB 15.9 15.8 14.3  HCT 47.4 47.7 44.5  MCV 87.3 88.5 87.9  MCH 29.3 29.3 28.3  MCHC 33.5 33.1 32.1  RDW 14.4 14.8 14.8  PLT 141* 137* 149*   Cardiac Enzymes Recent Labs  Lab 09/14/17 1801 09/14/17 2245 09/15/17 0522  TROPONINI 0.05* <0.03 0.18*    Recent Labs  Lab 09/14/17 1636  TROPIPOC 0.03    BNP Recent Labs  Lab 09/16/17 0320 09-22-2017 0241 09/18/17 0324  BNP 163.8* 223.5* 181.4*    DDimer No results for input(s): DDIMER in the last 168 hours.  Radiology/Studies:  US Renal  Result Date: 2017/09/22 CLINICAL DATA:  Acute kidney injury. Diabetes. Atrial fibrillation. EXAM: RENAL / URINARY TRACT ULTRASOUND COMPLETE COMPARISON:  None. FINDINGS: Right Kidney: Length: 9.7 cm. Mild renal cortical thinning with normal echogenicity. An upper pole right renal lesion measures 3.1 x 2.5 x 3.2 cm. This is primarily cystic but may have minimal complexity within. Left Kidney: Length: 12.4 cm. Mild renal cortical thinning. Normal echogenicity. An upper pole left renal lesion measures 3.7 x 3.4 x 4.6 cm. This is hypoechoic with suggestion of enhanced through transmission. A lower pole exophytic 4.3 x 3.8 x 4.4 cm left renal lesion is more simple in appearance. Bladder: Appears normal for degree of bladder distention. IMPRESSION: 1.  No hydronephrosis. 2. Mild renal cortical thinning bilaterally. 3. Bilateral renal lesions. Most  likely cysts and complex cysts. Upper pole renal lesions bilaterally are indeterminate. Consider outpatient pre and post contrast abdominal MRI or CT. Electronically Signed   By: Abigail Miyamoto M.D.   On: 09/22/17 09:33   Dg Chest Portable 1 View  Result Date: 09/14/2017 CLINICAL DATA:  Chest pain EXAM: PORTABLE CHEST 1 VIEW COMPARISON:  12/26/2013 FINDINGS: Cardiomegaly with interstitial coarsening diffusely. Vascular pedicle widening. Status post CABG. Remote right rib fractures. No effusion or pneumothorax. IMPRESSION: CHF pattern, mild. Electronically Signed   By: Monte Fantasia M.D.   On: 09/14/2017 16:45     STS Risk Calculator:    Procedure: AVR + CAB   Risk of Mortality:  13.587%   Renal Failure:  27.272%   Permanent Stroke:  3.357%   Prolonged Ventilation:  33.314%   DSW Infection:  0.485%   Reoperation:  5.674%   Morbidity or Mortality:  49.598%   Short Length of Stay:  9.100%   Long Length of Stay:  23.570%     Assessment and Plan:   Cameron [name] is a 76 y.o. male with symptoms of severe, stage D aortic stenosis with NYHA Class III symptoms. I have personally reviewed the patient's recent echocardiogram which is notable for normal LV systolic function and severe aortic stenosis with peak gradient of 64 mmHg and mean transvalvular gradient of 36 mmHg. The patient's dimensionless index is 0.26 and calculated aortic valve area is 0.9 cm.  [facility] 09/17/17 showed 4/4 patent grafts with severe focal 90% stenosis SVG-Diag1, mod pulm HTN and severe AS. Tentative plans are for PCI of SVG--> diag on 09/20/17.   The patient is also noted to be in possible atrial fibrillation with rapid ventricular response on tele. I ordered an ECG which appears to be afib with RBBB. Dr. Burt Knack to review tomorrow. He is currently on IV heparin for ACS treatment. Will defer to his primary cardiologist on need for long term [place] if he remains in afib.   I have reviewed the natural history of aortic  stenosis with the patient. We have discussed the limitations of medical therapy and the poor prognosis associated with symptomatic aortic stenosis. We have reviewed potential treatment options, including palliative medical therapy, conventional surgical aortic valve replacement, and transcatheter aortic valve replacement. We discussed treatment options in the context of this patient's specific comorbid medical conditions.   The patient's predicted risk of mortality with conventional aortic valve replacement is 13.6% primarily based on the patient's age, previous bypass surgery, acute presentation with NSTEMI, CAD with severe SVG-->diag stenosis requiring PCI, acute CHF, uncontrolled DMT2 (HgA1c 9.1), CKD stage III, possible atrial fibrillation, HTN, COPD, and obesity. TAVR seems like a reasonable treatment option for this patient pending formal cardiac surgical consultation. The patient is likekly not a candidate for repeat cardiac surgery and has no interest in surgical options.   We discussed typical evaluation which will require a gated cardiac CTA and a CTA of the chest/abdomen/pelvis to evaluate both his cardiac anatomy and peripheral vasculature. This will be arranged as an outpatient given need for cath with contrast dye on Friday. We would stage CT scans at least 1 week for renal protection. I will go ahead and order PFTs and carotids dopplers while he is admitted.    Signed, Angelena Form, PA-C  09/18/2017 2:28 PM  Patient seen, examined. Available data reviewed. Agree with findings, assessment, and plan as outlined by Nell Range, PA-C. On my exam today: Vitals:   09/19/17 0125 09/19/17 0357  BP: (!) 146/99 (!) 151/64  Pulse: (!) 109 (!) 105  Resp: (!) 27 (!) 22  Temp: 97.7 F (36.5 C) 97.9 F (36.6 C)  SpO2: 99% 99%   Pt is alert and oriented, NAD, sitting up in a chair at the bedside.  HEENT: normal Neck: JVP - normal, carotids 2+= without bruits Lungs: CTA  bilaterally CV: irregularly irregular with 2/6 harsh systolic murmur at the RUSB, no diastolic murmur Abd: soft, NT, Positive BS, no hepatomegaly Ext: no C/C/E, distal pulses intact and equal Skin: warm/dry no rash  He is retired Clinical biochemist, married with 2 daughters who live locally. He was active until 3-4 years ago, previously walking 2 miles/day. He has developed slowly progressive shortness of breath with activity as well as associated cough. He is no longer able to walk very far because of these symptoms. He's also developed frequent angina over the past several weeks, progressive and now severe. Currently CP-free at the time of my interview with him.   Echo and cath images are personally reviewed. The patient's echo show moderate calcification and restriction of all 3 aortic valve leaflets. The mean gradient is as high as 41 mmHg, Vm 4.1 m/s, and calculated AVA 0.73 square cm. LV function is preserved. There is mild MR.   Cath films reveal severe native vessel occlusive disease with patency of all bypass grafts. There is diffuse obstructive disease beyond the graft insertion sites in the LAD  and circumflex distributions. There is severe focal obstruction in the mid-body of the SVG-diagonal.   The patient has developed atrial fibrillation with RVR during his admission and is now anticoagulated with IV heparin.  I have reviewed the natural history of aortic stenosis with the patient today. There is no family at the bedside. We have discussed the limitations of medical therapy and the poor prognosis associated with symptomatic aortic stenosis. We have reviewed potential treatment options, including palliative medical therapy, conventional surgical aortic valve replacement, and transcatheter aortic valve replacement. We discussed treatment options in the context of the patient's specific comorbid medical conditions.   It seems reasonable to proceed with PCI tomorrow as planned since the  patient's primary issue is crescendo angina and he has severe SVG obstruction. Once he recovers from PCI, it would be reasonable to continue with evaluation for definitive treatment of severe symptomatic aortic stenosis. TAVR seems like best approach in this 76 yo male with prior CABG and comorbid conditions of atrial fibrillation and Stage IV CKD. We reviewed outpatient workup including cardiac surgical consultation and CTA studies which will need to be staged in the setting of his kidney disease. He would like to get through the holidays before proceeding with TAVR and this seems appropriate considering his other medical problems.   Our team will follow peripherally and will arrange hospital FU once he is discharged. All of his questions are answered today - thanks for allowing [place] to participate in his care.   Sherren Mocha, M.D. 09/19/2017 9:09 AM

## 2017-09-18 NOTE — Progress Notes (Signed)
Subjective:  Cath= severe native vessel CAD, patent grafts- severe focal 90% stenosis svg-diag1 also pulmonary HTN and severe aortic stenosis- full management plan not final.  Had 1300 of UOP and crt stable from pre procedure  Objective Vital signs in last 24 hours: Vitals:   09/17/17 1903 09/17/17 1926 09/17/17 2213 09/18/17 0356  BP: (!) 166/74 135/74 (!) 144/70 (!) 161/71  Pulse: 79 79  78  Resp: 19 17  18   Temp:  98.3 F (36.8 C)  98 F (36.7 C)  TempSrc:  Oral  Oral  SpO2: 93% 92%  99%  Weight:      Height:       Weight change:   Intake/Output Summary (Last 24 hours) at 09/18/2017 0920 Last data filed at 09/18/2017 6294 Gross per 24 hour  Intake 357 ml  Output 1350 ml  Net -993 ml    Assessment/ Plan: Pt is a 76 y.o. 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 Renal- fortunately renal function stayed stable from pre cath to today, making urine.  GFR stable in high 20's /low 30's.  He does tell me today that he would likely refuse dialysis so this needs to be a component of decision making for this pt 2. BP/vol- BP is generous - toprol and imdur as well as PRN hydralazine.  He is resitant to change in meds "they have been changed" "my BP is up b/c I am upset"  Would resume lasix tomorrow at least 40 daily as he does have some edema IF no further dye based procedures desired 3. Cards-plan up in the air.  See above that pt would refuse HD so would really think hard about procedure that could put kidney function at risk 4. Anemia- not an issue  5. Secondary hyperparathyroidism- eventually will follow with Dr. Joelyn Oms and he can check these labs and treat as needed 6. Dispo- if no further intervention planned would be OK for discharge from our standpoint     [place]: Basic Metabolic Panel: Recent Labs  Lab 09/16/17 0551 09/17/17 0241 09/17/17 1113 09/18/17 0324  NA 139 140   --  141  K 4.4 4.1  --  3.7  CL 101 106  --  108  CO2 28 24  --  26  GLUCOSE 243* 147*  --  107*  BUN 34* 37* 35* 33*  CREATININE 2.47* 2.25* 2.14* 2.14*  CALCIUM 8.9 8.7*  --  8.6*   Liver Function Tests: Recent Labs  Lab 09/14/17 1618 09/15/17 0522  AST 14* 12*  ALT 17 13*  ALKPHOS 92 78  BILITOT 0.5 0.6  PROT 7.2 6.2*  ALBUMIN 3.5 3.2*   No results for input(s): LIPASE, AMYLASE in the last 168 hours. No results for input(s): AMMONIA in the last 168 hours. CBC: Recent Labs  Lab 09/14/17 1618 09/15/17 0522 09/16/17 0320 09/17/17 0241 09/18/17 0324  WBC 7.0 11.3* 8.9 7.9 8.3  NEUTROABS 4.0  --   --   --   --   HGB 16.4 14.7 15.9 15.8 14.3  HCT 48.2 43.7 47.4 47.7 44.5  MCV 85.8 85.9 87.3 88.5 87.9  PLT 132* 137* 141* 137* 149*   Cardiac Enzymes: Recent Labs  Lab 09/14/17 1801 09/14/17 2245 09/15/17 0522  TROPONINI 0.05* <0.03 0.18*   CBG: Recent Labs  Lab 09/17/17 1838 09/17/17 2010 09/18/17 0001 09/18/17 0357 09/18/17 0804  GLUCAP 117*  136* 152* 120* 89    Iron Studies: No results for input(s): IRON, TIBC, TRANSFERRIN, FERRITIN in the last 72 hours. Studies/Results: US Renal  Result Date: 09/17/2017 CLINICAL DATA:  Acute kidney injury. Diabetes. Atrial fibrillation. EXAM: RENAL / URINARY TRACT ULTRASOUND COMPLETE COMPARISON:  None. FINDINGS: Right Kidney: Length: 9.7 cm. Mild renal cortical thinning with normal echogenicity. An upper pole right renal lesion measures 3.1 x 2.5 x 3.2 cm. This is primarily cystic but may have minimal complexity within. Left Kidney: Length: 12.4 cm. Mild renal cortical thinning. Normal echogenicity. An upper pole left renal lesion measures 3.7 x 3.4 x 4.6 cm. This is hypoechoic with suggestion of enhanced through transmission. A lower pole exophytic 4.3 x 3.8 x 4.4 cm left renal lesion is more simple in appearance. Bladder: Appears normal for degree of bladder distention. IMPRESSION: 1.  No hydronephrosis. 2. Mild renal  cortical thinning bilaterally. 3. Bilateral renal lesions. Most likely cysts and complex cysts. Upper pole renal lesions bilaterally are indeterminate. Consider outpatient pre and post contrast abdominal MRI or CT. Electronically Signed   By: Abigail Miyamoto M.D.   On: 09/17/2017 09:33   Medications: Infusions: . sodium chloride    . heparin Stopped (09/17/17 1600)    Scheduled Medications: . aspirin EC  81 mg Oral Daily  . Chlorhexidine Gluconate Cloth  6 each Topical Q0600  . insulin aspart  0-20 Units Subcutaneous TID WC  . insulin NPH Human  45 Units Subcutaneous BID AC & HS   And  . insulin aspart  28 Units Subcutaneous BID AC  . isosorbide mononitrate  60 mg Oral Daily  . metoprolol succinate  50 mg Oral Daily  . mupirocin ointment  1 application Nasal BID  . rosuvastatin  40 mg Oral q1800  . sodium chloride flush  3 mL Intravenous Q12H  . ticagrelor  90 mg Oral BID    have reviewed scheduled and prn medications.  Physical Exam: General: obese, comfortable Heart: RRR Lungs: mostly clear Abdomen: soft, non tender Extremities: pitting to dep areas- moreso on right     09/18/2017,9:20 AM  LOS: 4 days

## 2017-09-18 NOTE — Progress Notes (Signed)
ANTICOAGULATION CONSULT NOTE - Follow Up Consult  Pharmacy Consult for Heparin Indication: chest pain/ACS  Allergies  Allergen Reactions  . Cefuroxime Diarrhea  . Ezetimibe Diarrhea  . Pravastatin Other (See Comments)    myalgias    Patient Measurements: Height: 5\' 9"  (175.3 cm) Weight: 219 lb 2.2 oz (99.4 kg) IBW/kg (Calculated) : 70.7 Heparin Dosing Weight: 92 kg  Vital Signs: Temp: 98 F (36.7 C) (11/21 0356) Temp Source: Oral (11/21 0356) BP: 168/87 (11/21 1026) Pulse Rate: 83 (11/21 1026)  Labs: Recent Labs    09/16/17 0320  09/17/17 0241 09/17/17 1113 09/18/17 0324  HGB 15.9  --  15.8  --  14.3  HCT 47.4  --  47.7  --  44.5  PLT 141*  --  137*  --  149*  HEPARINUNFRC 0.35  --  0.17* 0.21*  --   CREATININE  --    < > 2.25* 2.14* 2.14*   < > = values in this interval not displayed.    Estimated Creatinine Clearance: 33.6 mL/min (A) (by C-G formula based on SCr of 2.14 mg/dL (H)).  Assessment: 76 yr old male here with ACS/chest pain s/p diagnostic angiogram on 11/20 for PCI on either Friday or Monday.  IV heparin is currently paused due to an episode of bleeding yesterday. Currently with no active bleeding. Discussed with cardiologist who is okay with resuming IV heparin now for PCI. SCr down to 2.14. H/H remains wnl. Plt 149k.   Goal of Therapy:  Heparin level 0.3-0.7 units/ml Monitor platelets by anticoagulation protocol: Yes   Plan:  Resume IV heparin at 1700 units/hr since HL was subtherapeutic on 1600 units/hr.  F/u 8 hr HL   Daily heparin level and CBC Monitor for s/s bleeding  Albertina Parr, PharmD., BCPS Clinical Pharmacist Pager [phone]

## 2017-09-19 ENCOUNTER — Encounter (HOSPITAL_COMMUNITY): Payer: Medicare Other

## 2017-09-19 DIAGNOSIS — I350 Nonrheumatic aortic (valve) stenosis: Secondary | ICD-10-CM

## 2017-09-19 LAB — GLUCOSE, CAPILLARY
GLUCOSE-CAPILLARY: 88 mg/dL (ref 65–99)
GLUCOSE-CAPILLARY: 122 mg/dL — AB (ref 65–99)
Glucose-Capillary: 161 mg/dL — ABNORMAL HIGH (ref 65–99)
Glucose-Capillary: 126 mg/dL — ABNORMAL HIGH (ref 65–99)

## 2017-09-19 LAB — BASIC METABOLIC PANEL
Anion gap: 8 (ref 5–15)
BUN: 30 mg/dL — ABNORMAL HIGH (ref 6–20)
CHLORIDE: 108 mmol/L (ref 101–111)
CO2: 24 mmol/L (ref 22–32)
Calcium: 8.8 mg/dL — ABNORMAL LOW (ref 8.9–10.3)
Creatinine, Ser: 2.27 mg/dL — ABNORMAL HIGH (ref 0.61–1.24)
GFR calc Af Amer: 30 mL/min — ABNORMAL LOW (ref >60)
GFR calc non Af Amer: 26 mL/min — ABNORMAL LOW (ref >60)
Glucose, Bld: 98 mg/dL (ref 65–99)
Potassium: 3.7 mmol/L (ref 3.5–5.1)
Sodium: 140 mmol/L (ref 135–145)

## 2017-09-19 LAB — CBC
HCT: 44.8 % (ref 39.0–52.0)
HEMOGLOBIN: 15 g/dL (ref 13.0–17.0)
MCH: 29 pg (ref 26.0–34.0)
MCHC: 33.5 g/dL (ref 30.0–36.0)
MCV: 86.7 fL (ref 78.0–100.0)
Platelets: 133 10*3/uL — ABNORMAL LOW (ref 150–400)
RBC: 5.17 MIL/uL (ref 4.22–5.81)
RDW: 14.5 % (ref 11.5–15.5)
WBC: 9.8 10*3/uL (ref 4.0–10.5)

## 2017-09-19 LAB — HEPARIN LEVEL (UNFRACTIONATED): Heparin Unfractionated: 0.43 IU/mL (ref 0.30–0.70)

## 2017-09-19 LAB — BRAIN NATRIURETIC PEPTIDE: B NATRIURETIC PEPTIDE 5: 217.3 pg/mL — AB (ref 0.0–100.0)

## 2017-09-19 MED ORDER — METOPROLOL TARTRATE 50 MG PO TABS: 75.0000 mg | ORAL_TABLET | Freq: Two times a day (BID) | ORAL | Status: DC

## 2017-09-19 MED ORDER — CLOPIDOGREL BISULFATE 75 MG PO TABS
300.0000 mg | ORAL_TABLET | Freq: Once | ORAL | Status: AC
  Administered 2017-09-19: 300 mg via ORAL
  Filled 2017-09-19: qty 4

## 2017-09-19 MED ORDER — ASPIRIN 81 MG PO CHEW
81.0000 mg | CHEWABLE_TABLET | Freq: Every day | ORAL | Status: DC
  Administered 2017-09-20 – 2017-09-22 (×3): 81 mg via ORAL
  Filled 2017-09-19 (×3): qty 1

## 2017-09-19 MED ORDER — METOPROLOL TARTRATE 25 MG PO TABS
37.5000 mg | ORAL_TABLET | Freq: Two times a day (BID) | ORAL | Status: DC
  Administered 2017-09-19 – 2017-09-24 (×10): 37.5 mg via ORAL
  Filled 2017-09-19 (×11): qty 1

## 2017-09-19 MED ORDER — SODIUM CHLORIDE 0.9 % IV SOLN
INTRAVENOUS | Status: AC
  Administered 2017-09-19 (×3): via INTRAVENOUS

## 2017-09-19 MED ORDER — CLOPIDOGREL BISULFATE 75 MG PO TABS: 300.0000 mg | ORAL_TABLET | Freq: Every day | ORAL | Status: DC

## 2017-09-19 MED ORDER — METOPROLOL TARTRATE 25 MG PO TABS
25.0000 mg | ORAL_TABLET | Freq: Once | ORAL | Status: AC
  Administered 2017-09-19: 25 mg via ORAL
  Filled 2017-09-19: qty 1

## 2017-09-19 MED ORDER — INSULIN NPH (HUMAN) (ISOPHANE) 100 UNIT/ML ~~LOC~~ SUSP
20.0000 [IU] | Freq: Two times a day (BID) | SUBCUTANEOUS | Status: DC
  Filled 2017-09-19: qty 10

## 2017-09-19 MED ORDER — CLOPIDOGREL BISULFATE 75 MG PO TABS
75.0000 mg | ORAL_TABLET | Freq: Every day | ORAL | Status: DC
  Administered 2017-09-20 – 2017-09-25 (×6): 75 mg via ORAL
  Filled 2017-09-19 (×7): qty 1

## 2017-09-19 NOTE — Progress Notes (Signed)
ANTICOAGULATION CONSULT NOTE - Follow Up Consult  Pharmacy Consult for Heparin Indication: chest pain/ACS  Allergies  Allergen Reactions  . Cefuroxime Diarrhea  . Ezetimibe Diarrhea  . Pravastatin Other (See Comments)    myalgias    Patient Measurements: Height: 5\' 9"  (175.3 cm) Weight: 211 lb (95.7 kg) IBW/kg (Calculated) : 70.7 Heparin Dosing Weight: 92 kg  Vital Signs: Temp: 97.9 F (36.6 C) (11/22 0357) Temp Source: Oral (11/22 0357) BP: 169/66 (11/22 0927) Pulse Rate: 111 (11/22 0927)  Labs: Recent Labs    09/17/17 0241 09/17/17 1113 09/18/17 0324 09/18/17 2030 09/19/17 0250 09/19/17 0933  HGB 15.8  --  14.3  --  15.0  --   HCT 47.7  --  44.5  --  44.8  --   PLT 137*  --  149*  --  133*  --   HEPARINUNFRC 0.17* 0.21*  --  0.34  --  0.43  CREATININE 2.25* 2.14* 2.14*  --  2.27*  --     Estimated Creatinine Clearance: 31.1 mL/min (A) (by C-G formula based on SCr of 2.27 mg/dL (H)).   Medications: Heparin @ 1700 units/hr  Assessment: 76yom s/p cath 11/20 found to have 90% stenosed SVG-Diag. Heparin resumed post-cath with plan for staged PCI tomorrow. Heparin level is therapeutic at 0.43. CBC stable. No bleeding reported.  Goal of Therapy:  Heparin level 0.3-0.7 units/ml Monitor platelets by anticoagulation protocol: Yes   Plan:  1) Continue heparin at 1700 units/hr 2) Daily heparin level and CBC  Nena Jordan, PharmD, BCPS  09/19/2017 10:23 AM

## 2017-09-19 NOTE — Progress Notes (Signed)
PROGRESS NOTE  Cameron [name] IDP:[id number] DOB: [birth year] DOA: 09/14/2017 PCP: Parke Poisson, MD  Brief Narrative: 69yom PMH CAD s/p CABG, presented with chest pain. Admitted by cardiology for ACS, medicine consulted for assistance with medical management of diabetes. Treated for ACS. S/p [facility] 11/20.  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 Hgb A1c 9.1 %. -  CBG remains stable. Good oral intake. Only 4 units SSI needed last 24 hours. PM dose of NPH was held last night. Fasting CBG was 88 this AM. - decrease NPH to 20 units BID and skip dose tonight; patient takes at lunch and dinner at home. - will continue to follow  COPD, PMH tobacco use -  Appears stable.  Thrombocytopenia - remains stable. Present since 2014; recommend considering outpatient evaluation. No further inpatient recommendations.  ACS, unstable angina pectoris, NSTEMI - per cardiology; ASA, Brilinta, heparin, Crestor, metoprolol, Imdur  Severe native vessel CAD by cath this admission; s/p CABG 2000 - per cardiology   Severe aortic stenosis  - per cardiology  AKI/CKD stage III with reported baseline creatinine of 2.0. - management per nephrology   Renal cysts. - radiology recommended outpt CT or MRI to further characterize.   DVT prophylaxis: heparin infusion Code Status: full Family Communication: none Disposition Plan: home    Murray Hodgkins, MD  [facility] Direct contact: [phone] --[place]  --[email or web address]; password [email or web address]  7PM-7AM contact night coverage as above 09/19/2017, 9:41 AM  LOS: 5 days   Consultants:  Nephrology   TRH  Procedures:  Echo  Impressions:  - Preserved LV systolic function s/p CABG. Grade II diastolic dysfunction, elevated LAP. Severe aortic stenosis. Trace calcific mitral stenosis. Biatrial enlargement. Mild RV dilatation with preserved systolic function.  R/LHC Conclusion   Severe  native vessel CAD Patent grafts 4/4  (LIMA-LAD, seq SVG-PDA, PLA, seq SVG-OM1, OM2, SVG-Diag1) Severe focal 90% stenosis SVG-Diag1 WHO Grp II Pulmonary hypertension Mean PA 32 mmhg  Recommendation: Heart team discussion regarding management of CAD and severe aortic stenosis Continue ASA/brilinta and heparin.    Antimicrobials:    Interval history/Subjective: Developed atrial fibrillation.  Feels ok today.  Objective: Vitals:  Vitals:   09/19/17 0357 09/19/17 0927  BP: (!) 151/64 (!) 169/66  Pulse: (!) 105 (!) 111  Resp: (!) 22   Temp: 97.9 F (36.6 C)   SpO2: 99%     Exam:  Constitutional:   . Appears calm and comfortable ENMT:  . grossly normal hearing  Respiratory:  . CTA bilaterally, no w/r/r.  . Respiratory effort normal.  Cardiovascular:  . RRR, no m/r/g Psychiatric:  . Mental status o Mood, affect appropriate . Judgement and insight appear normal    I have personally reviewed the following:   Labs:  CBG stable, no lows last 24 hours.  BMP noted, creatining 2.12 >> 2.27  CBC noted. Plts 149 >> 133  Scheduled Meds: . aspirin EC  81 mg Oral Daily  . Chlorhexidine Gluconate Cloth  6 each Topical Q0600  . insulin aspart  0-20 Units Subcutaneous TID WC  . insulin NPH Human  45 Units Subcutaneous BID AC & HS  . isosorbide mononitrate  60 mg Oral Daily  . metoprolol succinate  50 mg Oral Daily  . mupirocin ointment  1 application Nasal BID  . rosuvastatin  40 mg Oral q1800  . sodium chloride flush  3 mL Intravenous Q12H  . ticagrelor  90 mg Oral BID   Continuous Infusions: .  sodium chloride    . heparin 1,700 Units/hr (09/19/17 0133)    Active Problems:   Insulin dependent type 2 diabetes mellitus, uncontrolled (HCC)   Unstable angina (HCC)   CKD (chronic kidney disease) stage 3, GFR 30-59 ml/min (HCC)   Thrombocytopenia (HCC)   LOS: 5 days

## 2017-09-19 NOTE — Progress Notes (Signed)
Subjective:  No chest pain Breathing stable. S/p diagnostic angiogram 11/20  Mild left forearm bleeding Restless night after Ambien  New onset Afib  Objective:  Vital Signs in the last 24 hours: Temp:  [97.7 F (36.5 C)-98.7 F (37.1 C)] 97.9 F (36.6 C) (11/22 0357) Pulse Rate:  [102-126] 107 (11/22 1129) Resp:  [21-31] 22 (11/22 0357) BP: (118-169)/(64-99) 118/66 (11/22 1129) SpO2:  [93 %-99 %] 99 % (11/22 0357) Weight:  [95.7 kg (211 lb)] 95.7 kg (211 lb) (11/22 0503)  Intake/Output from previous day: 11/21 0701 - 11/22 0700 In: 720 [P.O.:720] Out: 1900 [Urine:1900] Intake/Output from this shift: No intake/output data recorded.  Physical Exam: HENT:  Head: Normocephalic and atraumatic.  Eyes: Conjunctivae and EOM are normal.  Neck: No JVD Cardiovascular: VariableS1. Soft S2. Irregularly irregular. III/VI crescendo murmur aortic area Vascular exam: Bilateral carotid bruit present. Femoral pulse difficult to feel due to obesity. Left popliteal normal and right faint. Absent pedal pulses bilateral Trace b/l edema Respiratory: No stridor. He has no rales GI: Soft.  Ventral hernia noted. BS present in all 4 quadrants Musculoskeletal: Normal range of motion. He exhibits no edema.  Lymphadenopathy: He has no cervical adenopathy.  Neurological: He is alert and oriented to person, place, and time.  Skin: Skin is warm and dry.      Lab Results: Recent Labs    09/18/17 0324 09/19/17 0250  WBC 8.3 9.8  HGB 14.3 15.0  PLT 149* 133*   Recent Labs    09/18/17 0324 09/19/17 0250  NA 141 140  K 3.7 3.7  CL 108 108  CO2 26 24  GLUCOSE 107* 98  BUN 33* 30*  CREATININE 2.14* 2.27*    Imaging: CXR 09/14/2017 CLINICAL DATA:  Chest pain  EXAM: PORTABLE CHEST 1 VIEW  COMPARISON:  12/26/2013  FINDINGS: Cardiomegaly with interstitial coarsening diffusely. Vascular pedicle widening. Status post CABG. Remote right rib fractures. No effusion or  pneumothorax.  IMPRESSION: CHF pattern, mild.   Electronically Signed   By: Monte Fantasia M.D.   On: 09/14/2017 16:45  Cardiac Studies: Echocardiogram 09/15/2017: Study Conclusions  - Left ventricle: Wall thickness was increased in a pattern of   severe LVH. There was concentric hypertrophy. The estimated   ejection fraction was in the range of 55% to 60%. Septal wall   dyskinesis. Doppler parameters are consistent with grade II   diastolic dysfunction, elevated LAP. - Aortic valve: Severely calcified annulus. Severely calcified   leaflets. Cusp separation was reduced. There was severe stenosis.   Valve area (Vmax): 0.9 cm^2. Vmax 4.1 cm, Mean PG 41 mmHg. - Mitral valve: Moderately calcified annulus. Moderately calcified   leaflets with restricted movement of posterior mitral valve   leaflet. The findings are consistent with trivial stenosis. - Left atrium: The atrium was moderately dilated. - Right ventricle: The cavity size was mildly dilated. - Right atrium: The atrium was mildly dilated.  Impressions:  - Preserved LV systolic function s/p CABG. Grade II diastolic   dysfunction, elevated LAP. Severe aortic stenosis. Trace calcific   mitral stenosis. Biatrial enlargement. Mild RV dilatation with   preserved systolic function.  Diagnostic angiogram RHC/Coronary + Bypass graft 09/17/2017 Procedures   RIGHT HEART CATH AND CORONARY/GRAFT ANGIOGRAPHY  Conclusion   Severe native vessel CAD Patent grafts 4/4  (LIMA-LAD, seq SVG-PDA, PLA, seq SVG-OM1, OM2, SVG-Diag1) Severe focal 90% stenosis SVG-Diag1 WHO Grp II Pulmonary hypertension Mean PA 32 mmhg  Recommendation: Heart team discussion regarding management of CAD and severe  aortic stenosis Continue ASA/brilinta and heparin.     Assessment:  NSTEMI: Type 1 vs type 2 in the setting of severe AS Severe AS CAD s/p CABG 2002: Severe native vessel CAD. 4/4 grafts patent. 90% focal SVG-Diag stenosis New onset  Atrial fibrilation with RVR  CHA2DS2VAsc score 4. Annual stroke risk 4.8% Hypertension Pulmonary hypertension WHO Grp II Uncontrolled type 2 DM AKI/CKD: Cr increased to 2.27 from 2.14, urine o/p stable since cath 11/20. Hyperlipidemia B/l carotid bruit  Plan: Continue ASA/heparin for now. Switch Brilinta to plavix. 300 mg today and 75 mg daily from 11/23. Possible discharge on plavix and xarelto 15 mg daily. Increase metoprolol to 37.5 mg bid starting tonight. May need to uptitrate further to control RVR, as tolerated.  Continue crestor 40 mg, Imdur 60 mg. Gentle hydration with 50 cc/hr overnight. If Cr improved on 11/23, will proceed with PCI to SVG-Diag, and possible to native OM1 Appreciate [name] input. Workup and TAVR likely outpatient.  Hospitalist and nephrology consult aprpeciated.   LOS: 5 days    Gayla Benn J Shatisha Falter 09/19/2017, 12:06 PM  Evone Arseneau Esther Hardy, MD [facility]. [place] Pager: [phone] Office: [phone] If no answer Cell [phone]

## 2017-09-19 NOTE — Progress Notes (Signed)
Subjective:  Cath= severe native vessel CAD, patent grafts- severe focal 90% stenosis svg-diag1 also pulmonary HTN and severe aortic stenosis- now looks like he will get PCI on Friday of one lesion THEN work up for TAVR.  Had 1900 of UOP and crt fairly stable from pre procedure.  Lucious Groves made him crazy   Objective Vital signs in last 24 hours: Vitals:   09/18/17 2300 09/19/17 0125 09/19/17 0357 09/19/17 0503  BP:  (!) 146/99 (!) 151/64   Pulse: (!) 102 (!) 109 (!) 105   Resp: (!) 21 (!) 27 (!) 22   Temp:  97.7 F (36.5 C) 97.9 F (36.6 C)   TempSrc:  Oral Oral   SpO2: 93% 99% 99%   Weight:    95.7 kg (211 lb)  Height:       Weight change:   Intake/Output Summary (Last 24 hours) at 09/19/2017 0859 Last data filed at 09/19/2017 0500 Gross per 24 hour  Intake 720 ml  Output 1900 ml  Net -1180 ml    Assessment/ Plan: Pt is a 76 y.o. 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 Renal- fortunately renal function stayed stable from pre cath to today, making urine.  GFR stable in high 20's /low 30's.  He does tell me  that he would likely refuse dialysis so this needs to be a component of decision making for this pt.  Planning on PCI tomorrow 2. BP/vol- BP is generous - toprol and imdur as well as PRN hydralazine.  He is resitant to change in meds "they have been changed" "my BP is up b/c I am upset"  Would not resume lasix yet as will get PCI tomorrow- I would add on some PO scheduled hydralazine he wants that to be up to the cardiologists 3. Cards-plan for PCI tomorrow and eventual TAVR.  See above that pt would refuse HD so would really think hard about procedure that could put kidney function at risk 4. Anemia- not an issue  5. Secondary hyperparathyroidism- eventually will follow with Dr. Joelyn Oms and he can check these labs and treat as needed  [place] [address] [place]: Basic Metabolic Panel: Recent  Labs  Lab 09/17/17 0241 09/17/17 1113 09/18/17 0324 09/19/17 0250  NA 140  --  141 140  K 4.1  --  3.7 3.7  CL 106  --  108 108  CO2 24  --  26 24  GLUCOSE 147*  --  107* 98  BUN 37* 35* 33* 30*  CREATININE 2.25* 2.14* 2.14* 2.27*  CALCIUM 8.7*  --  8.6* 8.8*   Liver Function Tests: Recent Labs  Lab 09/14/17 1618 09/15/17 0522  AST 14* 12*  ALT 17 13*  ALKPHOS 92 78  BILITOT 0.5 0.6  PROT 7.2 6.2*  ALBUMIN 3.5 3.2*   No results for input(s): LIPASE, AMYLASE in the last 168 hours. No results for input(s): AMMONIA in the last 168 hours. CBC: Recent Labs  Lab 09/14/17 1618 09/15/17 0522 09/16/17 0320 09/17/17 0241 09/18/17 0324 09/19/17 0250  WBC 7.0 11.3* 8.9 7.9 8.3 9.8  NEUTROABS 4.0  --   --   --   --   --   HGB 16.4 14.7 15.9 15.8 14.3 15.0  HCT 48.2 43.7 47.4 47.7 44.5 44.8  MCV 85.8 85.9 87.3 88.5 87.9 86.7  PLT 132* 137* 141* 137* 149* 133*   Cardiac Enzymes: Recent Labs  Lab 09/14/17 1801 09/14/17 2245 09/15/17 0522  TROPONINI 0.05* <0.03 0.18*   CBG: Recent Labs  Lab 09/18/17 0804 09/18/17 1214 09/18/17 1641 09/18/17 2113 09/19/17 0635  GLUCAP 89 118* 154* 108* 88    Iron Studies: No results for input(s): IRON, TIBC, TRANSFERRIN, FERRITIN in the last 72 hours. Studies/Results: No results found. Medications: Infusions: . sodium chloride    . heparin 1,700 Units/hr (09/19/17 0133)    Scheduled Medications: . aspirin EC  81 mg Oral Daily  . Chlorhexidine Gluconate Cloth  6 each Topical Q0600  . insulin aspart  0-20 Units Subcutaneous TID WC  . insulin NPH Human  45 Units Subcutaneous BID AC & HS  . isosorbide mononitrate  60 mg Oral Daily  . metoprolol succinate  50 mg Oral Daily  . mupirocin ointment  1 application Nasal BID  . rosuvastatin  40 mg Oral q1800  . sodium chloride flush  3 mL Intravenous Q12H  . ticagrelor  90 mg Oral BID    have reviewed scheduled and prn medications.  Physical Exam: General: obese,  comfortable Heart: RRR Lungs: mostly clear Abdomen: soft, non tender Extremities: pitting to dep areas- moreso on right     09/19/2017,8:59 AM  LOS: 5 days

## 2017-09-20 ENCOUNTER — Encounter (HOSPITAL_COMMUNITY): Payer: Medicare Other

## 2017-09-20 LAB — RENAL FUNCTION PANEL
ALBUMIN: 3 g/dL — AB (ref 3.5–5.0)
ANION GAP: 8 (ref 5–15)
BUN: 32 mg/dL — ABNORMAL HIGH (ref 6–20)
CHLORIDE: 109 mmol/L (ref 101–111)
CO2: 23 mmol/L (ref 22–32)
Calcium: 8.6 mg/dL — ABNORMAL LOW (ref 8.9–10.3)
Creatinine, Ser: 2.32 mg/dL — ABNORMAL HIGH (ref 0.61–1.24)
GFR calc Af Amer: 30 mL/min — ABNORMAL LOW (ref >60)
GFR calc non Af Amer: 25 mL/min — ABNORMAL LOW (ref >60)
GLUCOSE: 142 mg/dL — AB (ref 65–99)
PHOSPHORUS: 3.5 mg/dL (ref 2.5–4.6)
POTASSIUM: 4.1 mmol/L (ref 3.5–5.1)
Sodium: 140 mmol/L (ref 135–145)

## 2017-09-20 LAB — CBC
HEMATOCRIT: 44.4 % (ref 39.0–52.0)
Hemoglobin: 14.8 g/dL (ref 13.0–17.0)
MCH: 29 pg (ref 26.0–34.0)
MCHC: 33.3 g/dL (ref 30.0–36.0)
MCV: 87.1 fL (ref 78.0–100.0)
Platelets: 128 10*3/uL — ABNORMAL LOW (ref 150–400)
RBC: 5.1 MIL/uL (ref 4.22–5.81)
RDW: 14.8 % (ref 11.5–15.5)
WBC: 10.3 10*3/uL (ref 4.0–10.5)

## 2017-09-20 LAB — GLUCOSE, CAPILLARY
GLUCOSE-CAPILLARY: 122 mg/dL — AB (ref 65–99)
GLUCOSE-CAPILLARY: 209 mg/dL — AB (ref 65–99)
GLUCOSE-CAPILLARY: 217 mg/dL — AB (ref 65–99)
Glucose-Capillary: 168 mg/dL — ABNORMAL HIGH (ref 65–99)

## 2017-09-20 LAB — HEPARIN LEVEL (UNFRACTIONATED): Heparin Unfractionated: 0.51 IU/mL (ref 0.30–0.70)

## 2017-09-20 LAB — BRAIN NATRIURETIC PEPTIDE: B Natriuretic Peptide: 274.2 pg/mL — ABNORMAL HIGH (ref 0.0–100.0)

## 2017-09-20 LAB — TRIGLYCERIDES: Triglycerides: 126 mg/dL (ref <150)

## 2017-09-20 MED ORDER — FUROSEMIDE 10 MG/ML IJ SOLN
20.0000 mg | Freq: Once | INTRAMUSCULAR | Status: AC
  Administered 2017-09-20: 20 mg via INTRAVENOUS
  Filled 2017-09-20: qty 2

## 2017-09-20 MED ORDER — CITALOPRAM HYDROBROMIDE 20 MG PO TABS
10.0000 mg | ORAL_TABLET | Freq: Every day | ORAL | Status: DC
  Administered 2017-09-20 – 2017-09-25 (×6): 10 mg via ORAL
  Filled 2017-09-20 (×7): qty 1

## 2017-09-20 MED ORDER — TRAZODONE HCL 50 MG PO TABS
25.0000 mg | ORAL_TABLET | Freq: Every day | ORAL | Status: DC
  Administered 2017-09-20 – 2017-09-24 (×5): 25 mg via ORAL
  Filled 2017-09-20 (×5): qty 1

## 2017-09-20 MED ORDER — HYDRALAZINE HCL 25 MG PO TABS
25.0000 mg | ORAL_TABLET | Freq: Three times a day (TID) | ORAL | Status: DC
  Administered 2017-09-20 – 2017-09-25 (×16): 25 mg via ORAL
  Filled 2017-09-20 (×16): qty 1

## 2017-09-20 MED ORDER — TRAZODONE HCL 50 MG PO TABS: 25.0000 mg | ORAL_TABLET | Freq: Every evening | ORAL | Status: DC | PRN

## 2017-09-20 MED ORDER — INSULIN NPH (HUMAN) (ISOPHANE) 100 UNIT/ML ~~LOC~~ SUSP
10.0000 [IU] | Freq: Two times a day (BID) | SUBCUTANEOUS | Status: DC
  Administered 2017-09-20 – 2017-09-25 (×10): 10 [IU] via SUBCUTANEOUS
  Filled 2017-09-20 (×3): qty 10

## 2017-09-20 NOTE — Progress Notes (Signed)
Subjective:  Got fluids at 50 per hour overnight in prep for PCI today - crt from 2.27 to 2.32 so they decided not to do- also felt that pt was getting overloaded so stopped IVF and gave lasix 20 IV-  I's and O's actually fairly even- made a liter of urine  Objective Vital signs in last 24 hours: Vitals:   09/19/17 2019 09/19/17 2220 09/20/17 0108 09/20/17 0430  BP: 136/80 (!) 151/97  (!) 137/93  Pulse: 94 98  89  Resp: (!) 24   (!) 23  Temp: 98.9 F (37.2 C)   98.6 F (37 C)  TempSrc: Oral   Oral  SpO2: 100%   100%  Weight:   96.2 kg (212 lb)   Height:       Weight change: 0.454 kg (1 lb)  Intake/Output Summary (Last 24 hours) at 09/20/2017 0936 Last data filed at 09/20/2017 0854 Gross per 24 hour  Intake 769 ml  Output 975 ml  Net -206 ml    Assessment/ Plan: Pt is a 76 y.o. 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 Renal- actually  renal function staying pretty stable from pre cath to today, running from 2.14 to 2.41 this hosp this might actually be his baseline (the 2.0 was 3 years ago).  He is making urine.   He does tell me  that he would likely refuse dialysis so this needs to be a component of decision making for this pt.  Planning on PCI now maybe Monday- as above not sure creatinine will get much better than 2.1 2. BP/vol- BP is generous - toprol and imdur as well as scheduled hydralazine.   BP variable- Volume likely playing a part BUT if we get volume down then creatinine may get to 2.5 or higher (not reflecting worse kidney function but just different volume status) 3. Cards-plan for PCI eventually and then TAVR as OP.   See above that pt would refuse HD so would really think hard about procedure that could put kidney function at risk 4. Anemia- not an issue  5. Secondary hyperparathyroidism- eventually will follow with Dr. Joelyn Oms and he can check these labs and treat  as needed  [place]: Basic Metabolic Panel: Recent Labs  Lab 09/18/17 0324 09/19/17 0250 09/20/17 0253  NA 141 140 140  K 3.7 3.7 4.1  CL 108 108 109  CO2 26 24 23   GLUCOSE 107* 98 142*  BUN 33* 30* 32*  CREATININE 2.14* 2.27* 2.32*  CALCIUM 8.6* 8.8* 8.6*  PHOS  --   --  3.5   Liver Function Tests: Recent Labs  Lab 09/14/17 1618 09/15/17 0522 09/20/17 0253  AST 14* 12*  --   ALT 17 13*  --   ALKPHOS 92 78  --   BILITOT 0.5 0.6  --   PROT 7.2 6.2*  --   ALBUMIN 3.5 3.2* 3.0*   No results for input(s): LIPASE, AMYLASE in the last 168 hours. No results for input(s): AMMONIA in the last 168 hours. CBC: Recent Labs  Lab 09/14/17 1618  09/16/17 0320 09/17/17 0241 09/18/17 0324 09/19/17 0250 09/20/17 0253  WBC 7.0   < > 8.9 7.9 8.3 9.8 10.3  NEUTROABS 4.0  --   --   --   --   --   --   HGB 16.4   < >  15.9 15.8 14.3 15.0 14.8  HCT 48.2   < > 47.4 47.7 44.5 44.8 44.4  MCV 85.8   < > 87.3 88.5 87.9 86.7 87.1  PLT 132*   < > 141* 137* 149* 133* 128*   < > = values in this interval not displayed.   Cardiac Enzymes: Recent Labs  Lab 09/14/17 1801 09/14/17 2245 09/15/17 0522  TROPONINI 0.05* <0.03 0.18*   CBG: Recent Labs  Lab 09/19/17 0635 09/19/17 1153 09/19/17 1628 09/19/17 2127 09/20/17 0646  GLUCAP 88 122* 161* 126* 122*    Iron Studies: No results for input(s): IRON, TIBC, TRANSFERRIN, FERRITIN in the last 72 hours. Studies/Results: No results found. Medications: Infusions: . sodium chloride    . heparin 1,700 Units/hr (09/20/17 0332)    Scheduled Medications: . aspirin  81 mg Oral Daily  . Chlorhexidine Gluconate Cloth  6 each Topical Q0600  . citalopram  10 mg Oral Daily  . clopidogrel  75 mg Oral Daily  . furosemide  20 mg Intravenous Once  . hydrALAZINE  25 mg Oral Q8H  . insulin aspart  0-20 Units Subcutaneous TID WC  . insulin NPH Human  20 Units Subcutaneous BID AC & HS  . isosorbide mononitrate  60 mg  Oral Daily  . metoprolol tartrate  37.5 mg Oral BID  . mupirocin ointment  1 application Nasal BID  . rosuvastatin  40 mg Oral q1800  . sodium chloride flush  3 mL Intravenous Q12H  . traZODone  25 mg Oral QHS    have reviewed scheduled and prn medications.  Physical Exam: General: obese, comfortable Heart: RRR Lungs: mostly clear Abdomen: soft, non tender Extremities: pitting to dep areas- moreso on right     09/20/2017,9:36 AM  LOS: 6 days        Subjective:  Cath= severe native vessel CAD, patent grafts- severe focal 90% stenosis svg-diag1 also pulmonary HTN and severe aortic stenosis- now looks like he will get PCI on Friday of one lesion THEN work up for TAVR.  Had 1900 of UOP and crt fairly stable from pre procedure.  Lucious Groves made him crazy   Objective Vital signs in last 24 hours: Vitals:   09/19/17 2019 09/19/17 2220 09/20/17 0108 09/20/17 0430  BP: 136/80 (!) 151/97  (!) 137/93  Pulse: 94 98  89  Resp: (!) 24   (!) 23  Temp: 98.9 F (37.2 C)   98.6 F (37 C)  TempSrc: Oral   Oral  SpO2: 100%   100%  Weight:   96.2 kg (212 lb)   Height:       Weight change: 0.454 kg (1 lb)  Intake/Output Summary (Last 24 hours) at 09/20/2017 3267 Last data filed at 09/20/2017 0854 Gross per 24 hour  Intake 769 ml  Output 975 ml  Net -206 ml    Assessment/ Plan: Pt is a 76 y.o. 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 Renal- fortunately renal function stayed stable from pre cath to today, making urine.  GFR stable in high 20's /low 30's.  He does tell me  that he would likely refuse dialysis so this needs to be a component of decision making for this pt.  Planning on PCI tomorrow 2. BP/vol- BP is generous - toprol and imdur as well as PRN hydralazine.  He is resitant to change in meds "they have been changed" "my BP is up b/c  I am upset"  Would not resume lasix yet as will get PCI tomorrow- I  would add on some PO scheduled hydralazine he wants that to be up to the cardiologists 3. Cards-plan for PCI tomorrow and eventual TAVR.  See above that pt would refuse HD so would really think hard about procedure that could put kidney function at risk 4. Anemia- not an issue  5. Secondary hyperparathyroidism- eventually will follow with Dr. Joelyn Oms and he can check these labs and treat as needed  [place]: Basic Metabolic Panel: Recent Labs  Lab 09/18/17 0324 09/19/17 0250 09/20/17 0253  NA 141 140 140  K 3.7 3.7 4.1  CL 108 108 109  CO2 26 24 23   GLUCOSE 107* 98 142*  BUN 33* 30* 32*  CREATININE 2.14* 2.27* 2.32*  CALCIUM 8.6* 8.8* 8.6*  PHOS  --   --  3.5   Liver Function Tests: Recent Labs  Lab 09/14/17 1618 09/15/17 0522 09/20/17 0253  AST 14* 12*  --   ALT 17 13*  --   ALKPHOS 92 78  --   BILITOT 0.5 0.6  --   PROT 7.2 6.2*  --   ALBUMIN 3.5 3.2* 3.0*   No results for input(s): LIPASE, AMYLASE in the last 168 hours. No results for input(s): AMMONIA in the last 168 hours. CBC: Recent Labs  Lab 09/14/17 1618  09/16/17 0320 09/17/17 0241 09/18/17 0324 09/19/17 0250 09/20/17 0253  WBC 7.0   < > 8.9 7.9 8.3 9.8 10.3  NEUTROABS 4.0  --   --   --   --   --   --   HGB 16.4   < > 15.9 15.8 14.3 15.0 14.8  HCT 48.2   < > 47.4 47.7 44.5 44.8 44.4  MCV 85.8   < > 87.3 88.5 87.9 86.7 87.1  PLT 132*   < > 141* 137* 149* 133* 128*   < > = values in this interval not displayed.   Cardiac Enzymes: Recent Labs  Lab 09/14/17 1801 09/14/17 2245 09/15/17 0522  TROPONINI 0.05* <0.03 0.18*   CBG: Recent Labs  Lab 09/19/17 0635 09/19/17 1153 09/19/17 1628 09/19/17 2127 09/20/17 0646  GLUCAP 88 122* 161* 126* 122*    Iron Studies: No results for input(s): IRON, TIBC, TRANSFERRIN, FERRITIN in the last 72 hours. Studies/Results: No results found. Medications: Infusions: . sodium chloride    . heparin 1,700 Units/hr (09/20/17 0332)     Scheduled Medications: . aspirin  81 mg Oral Daily  . Chlorhexidine Gluconate Cloth  6 each Topical Q0600  . citalopram  10 mg Oral Daily  . clopidogrel  75 mg Oral Daily  . furosemide  20 mg Intravenous Once  . hydrALAZINE  25 mg Oral Q8H  . insulin aspart  0-20 Units Subcutaneous TID WC  . insulin NPH Human  20 Units Subcutaneous BID AC & HS  . isosorbide mononitrate  60 mg Oral Daily  . metoprolol tartrate  37.5 mg Oral BID  . mupirocin ointment  1 application Nasal BID  . rosuvastatin  40 mg Oral q1800  . sodium chloride flush  3 mL Intravenous Q12H  . traZODone  25 mg Oral QHS    have reviewed scheduled and prn medications.  Physical Exam: General: obese, comfortable Heart: RRR Lungs: mostly clear Abdomen: soft, non tender Extremities: pitting to dep areas- moreso on right     09/20/2017,9:37 AM  LOS: 6 days

## 2017-09-20 NOTE — Plan of Care (Signed)
  Clinical Measurements: Cardiovascular complication will be avoided 09/20/2017 0545 - Progressing by Irish Lack, RN   Clinical Measurements: Ability to maintain clinical measurements within normal limits will improve 09/20/2017 0545 - Progressing by Irish Lack, RN Cardiovascular complication will be avoided 09/20/2017 0545 - Progressing by Irish Lack, RN

## 2017-09-20 NOTE — Progress Notes (Signed)
ANTICOAGULATION CONSULT NOTE - Initial Consult  Pharmacy Consult for Heparin Indication: NSTEMI, afib  Allergies  Allergen Reactions  . Cefuroxime Diarrhea  . Ezetimibe Diarrhea  . Pravastatin Other (See Comments)    myalgias    Patient Measurements: Height: 5\' 9"  (175.3 cm) Weight: 212 lb (96.2 kg) IBW/kg (Calculated) : 70.7 Heparin Dosing Weight:    Vital Signs: Temp: 98.6 F (37 C) (11/23 0430) Temp Source: Oral (11/23 0430) BP: 137/93 (11/23 0430) Pulse Rate: 89 (11/23 0430)  Labs: Recent Labs    09/18/17 0324 09/18/17 2030 09/19/17 0250 09/19/17 0933 09/20/17 0253  HGB 14.3  --  15.0  --  14.8  HCT 44.5  --  44.8  --  44.4  PLT 149*  --  133*  --  128*  HEPARINUNFRC  --  0.34  --  0.43 0.51  CREATININE 2.14*  --  2.27*  --  2.32*    Estimated Creatinine Clearance: 30.5 mL/min (A) (by C-G formula based on SCr of 2.32 mg/dL (H)).   Medical History: Past Medical History:  Diagnosis Date  . Atrial fibrillation ([place])   . CAD (coronary artery disease)    a. 2002: s/p CABG x 4V (LIMA-LAD, seq SVG-PDA, PLA, seq SVG-OM1, OM2, SVG-Diag1)  . CKD (chronic kidney disease)   . COPD (chronic obstructive pulmonary disease) ([place])   . Diabetes mellitus without complication ([place])   . HLD (hyperlipidemia)   . Hypertension   . Obesity   . Severe aortic stenosis     Assessment:  Anticoag: Heparin for ACS, afib, CHA2DS2VAsc score 4 - no AC PTA. HL 0.51. Hgb stable. Plts 128 down slightly.  Goal of Therapy:  Heparin level 0.3-0.7 units/ml Monitor platelets by anticoagulation protocol: Yes   Plan:  Heparin @ 1700 units/hr Daily heparin level and CBC Moved Staged PCI to 11/26 due to renal function  Eula Mazzola S. Alford Highland, PharmD, [place] Clinical Staff Pharmacist Pager [phone]  Eilene Ghazi Stillinger 09/20/2017,10:42 AM

## 2017-09-20 NOTE — Progress Notes (Signed)
PROGRESS NOTE  COE ANGELOS IOX:[id number] DOB: 10/03/1941 DOA: 09/14/2017 PCP: Parke Poisson, MD  Brief Narrative: 33yom PMH CAD s/p CABG, presented with chest pain. Admitted by cardiology for ACS, medicine consulted for assistance with medical management of diabetes. Treated for ACS. S/p [facility] 11/20.  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 Hgb A1c 9.1 %. -  No lows. Insulin requirement significantly lower as inpt with poor oral intake/appetite. CBG remains stable. Good oral intake. 7 units SSI needed last 24 hours. PM dose of NPH was held again last night. Fasting CBG was 122 this AM. - decrease NPH to 10 units BID   Diarrhea - no laxatives or abx this hospitalization. Monitor.   COPD, PMH tobacco use -  Stable.   Thrombocytopenia - remains stable. Present since 2014; recommend considering outpatient evaluation. No further inpatient recommendations.  ACS, unstable angina pectoris, NSTEMI - per cardiology   Severe native vessel CAD by cath this admission; s/p CABG 2000 - per cardiology   Severe aortic stenosis  - per cardiology  AKI/CKD stage III with reported baseline creatinine of 2.0. - per nephrology   Renal cysts. - radiology recommended outpt CT or MRI to further characterize.   DVT prophylaxis: heparin infusion Code Status: full Family Communication: none Disposition Plan: home    Murray Hodgkins, MD  [facility] Direct contact: [phone] --[place]  --[email or web address]; password [email or web address]  7PM-7AM contact night coverage as above 09/20/2017, 3:43 PM  LOS: 6 days   Consultants:  Nephrology   TRH  Procedures:  Echo  Impressions:  - Preserved LV systolic function s/p CABG. Grade II diastolic dysfunction, elevated LAP. Severe aortic stenosis. Trace calcific mitral stenosis. Biatrial enlargement. Mild RV dilatation with preserved systolic function.  R/LHC Conclusion   Severe native  vessel CAD Patent grafts 4/4  (LIMA-LAD, seq SVG-PDA, PLA, seq SVG-OM1, OM2, SVG-Diag1) Severe focal 90% stenosis SVG-Diag1 WHO Grp II Pulmonary hypertension Mean PA 32 mmhg  Recommendation: Heart team discussion regarding management of CAD and severe aortic stenosis Continue ASA/brilinta and heparin.    Antimicrobials:    Interval history/Subjective: PFTs canceled today because of diarrhea.  Reports multiple episodes of diarrhea overnight. Nausea but no vomiting today.  Objective: Vitals:  Vitals:   09/20/17 1510 09/20/17 1515  BP: 110/87 112/61  Pulse:  87  Resp: (!) 21 18  Temp:    SpO2:  92%    Exam:  Constitutional:   . Appears calm and comfortable Respiratory:  . CTA bilaterally, no w/r/r.  . Respiratory effort normal.  Cardiovascular:  . irregular, no m/r/g Psychiatric:  . Mental status o Mood, affect appropriate  I have personally reviewed the following:   Labs:  CBG stable  BMP noted, creatining 2.12 >> 2.27 >> 2.23  CBC noted. Plts 149 >> 133 >> 128  Scheduled Meds: . aspirin  81 mg Oral Daily  . Chlorhexidine Gluconate Cloth  6 each Topical Q0600  . citalopram  10 mg Oral Daily  . clopidogrel  75 mg Oral Daily  . hydrALAZINE  25 mg Oral Q8H  . insulin aspart  0-20 Units Subcutaneous TID WC  . insulin NPH Human  20 Units Subcutaneous BID AC & HS  . isosorbide mononitrate  60 mg Oral Daily  . metoprolol tartrate  37.5 mg Oral BID  . mupirocin ointment  1 application Nasal BID  . rosuvastatin  40 mg Oral q1800  . sodium chloride flush  3 mL Intravenous Q12H  .  traZODone  25 mg Oral QHS   Continuous Infusions: . sodium chloride    . heparin 1,700 Units/hr (09/20/17 0332)    Active Problems:   Insulin dependent type 2 diabetes mellitus, uncontrolled (HCC)   Unstable angina (HCC)   CKD (chronic kidney disease) stage 3, GFR 30-59 ml/min (HCC)   Thrombocytopenia (HCC)   Severe aortic stenosis   LOS: 6 days

## 2017-09-20 NOTE — Progress Notes (Signed)
Patient was brought down to pulmonary lab for PFT. Upon arrival patient informs me that he has had diarrhea all day today & vomited 15 minutes before he was brought down. Unable to perform PFT due to this. Will place him on the schedule for Monday.  Kathie Dike RRT

## 2017-09-20 NOTE — Progress Notes (Addendum)
Subjective:  No chest pain Breathing stable. S/p diagnostic angiogram 11/20  New onset Afib  Tearful and frustrated. Wants something for anxiety.  Objective:  Vital Signs in the last 24 hours: Temp:  [98 F (36.7 C)-98.9 F (37.2 C)] 98.6 F (37 C) (11/23 0430) Pulse Rate:  [89-111] 89 (11/23 0430) Resp:  [20-24] 23 (11/23 0430) BP: (118-169)/(66-97) 137/93 (11/23 0430) SpO2:  [97 %-100 %] 100 % (11/23 0430) Weight:  [96.2 kg (212 lb)] 96.2 kg (212 lb) (11/23 0108)  Intake/Output from previous day: 11/22 0701 - 11/23 0700 In: 1129 [P.O.:990; I.V.:139] Out: 975 [Urine:975] Intake/Output from this shift: No intake/output data recorded.  Physical Exam: HENT:  Head: Normocephalic and atraumatic.  Eyes: Conjunctivae and EOM are normal.  Neck: No JVD Cardiovascular: VariableS1. Soft S2. Irregularly irregular. III/VI crescendo murmur aortic area Vascular exam: Bilateral carotid bruit present. Femoral pulse difficult to feel due to obesity. Left popliteal normal and right faint. Absent pedal pulses bilateral Trace b/l edema Respiratory: No stridor. He has bibasilar rales GI: Soft.  Ventral hernia noted. BS present in all 4 quadrants Musculoskeletal: Normal range of motion. He exhibits no edema.  Lymphadenopathy: He has no cervical adenopathy.  Neurological: He is alert and oriented to person, place, and time.  Skin: Skin is warm and dry.      Lab Results: Recent Labs    09/19/17 0250 09/20/17 0253  WBC 9.8 10.3  HGB 15.0 14.8  PLT 133* 128*   Recent Labs    09/19/17 0250 09/20/17 0253  NA 140 140  K 3.7 4.1  CL 108 109  CO2 24 23  GLUCOSE 98 142*  BUN 30* 32*  CREATININE 2.27* 2.32*    Imaging: CXR 09/14/2017 CLINICAL DATA:  Chest pain  EXAM: PORTABLE CHEST 1 VIEW  COMPARISON:  12/26/2013  FINDINGS: Cardiomegaly with interstitial coarsening diffusely. Vascular pedicle widening. Status post CABG. Remote right rib fractures. No effusion or  pneumothorax.  IMPRESSION: CHF pattern, mild.   Electronically Signed   By: Monte Fantasia M.D.   On: 09/14/2017 16:45  Cardiac Studies: Echocardiogram 09/15/2017: Study Conclusions  - Left ventricle: Wall thickness was increased in a pattern of   severe LVH. There was concentric hypertrophy. The estimated   ejection fraction was in the range of 55% to 60%. Septal wall   dyskinesis. Doppler parameters are consistent with grade II   diastolic dysfunction, elevated LAP. - Aortic valve: Severely calcified annulus. Severely calcified   leaflets. Cusp separation was reduced. There was severe stenosis.   Valve area (Vmax): 0.9 cm^2. Vmax 4.1 cm, Mean PG 41 mmHg. - Mitral valve: Moderately calcified annulus. Moderately calcified   leaflets with restricted movement of posterior mitral valve   leaflet. The findings are consistent with trivial stenosis. - Left atrium: The atrium was moderately dilated. - Right ventricle: The cavity size was mildly dilated. - Right atrium: The atrium was mildly dilated.  Impressions:  - Preserved LV systolic function s/p CABG. Grade II diastolic   dysfunction, elevated LAP. Severe aortic stenosis. Trace calcific   mitral stenosis. Biatrial enlargement. Mild RV dilatation with   preserved systolic function.  Diagnostic angiogram RHC/Coronary + Bypass graft 09/17/2017 Procedures   RIGHT HEART CATH AND CORONARY/GRAFT ANGIOGRAPHY  Conclusion   Severe native vessel CAD Patent grafts 4/4  (LIMA-LAD, seq SVG-PDA, PLA, seq SVG-OM1, OM2, SVG-Diag1) Severe focal 90% stenosis SVG-Diag1 WHO Grp II Pulmonary hypertension Mean PA 32 mmhg  Recommendation: Heart team discussion regarding management of CAD and severe  aortic stenosis Continue ASA/brilinta and heparin.     Assessment:  NSTEMI: Type 1 vs type 2 in the setting of severe AS Severe AS CAD s/p CABG 2002: Severe native vessel CAD. 4/4 grafts patent. 90% focal SVG-Diag stenosis New onset  Atrial fibrilation with RVR  CHA2DS2VAsc score 4. Annual stroke risk 4.8% Hypertension: Uncontrolled Pulmonary hypertension WHO Grp II Uncontrolled type 2 DM AKI/CKD: Cr increased to 2.32 Depression Hyperlipidemia B/l carotid bruit  Plan: Continue ASA//plavix/heparin Possible discharge on plavix and xarelto 15 mg daily. Added hydralazine 25 mg tid for blood pressure control.  Continue metoprolol 37.5 mg bid. Continue crestor 40 mg, Imdur 60 mg. Hold fluids. Lasix 20 mg once. Will add fluids on Sunday. If Cr closer to baseline, plan to PCI to SVG-Diag, and possible to native OM1 on 11/26 Appreciate [name] input. Workup and TAVR likely outpatient.  Hospitalist and nephrology consult aprpeciated. Added Celexa 10 mg during day, trazodone 25 mg at night   LOS: 6 days    Kenadie Royce J Efrat Zuidema 09/20/2017, 9:10 AM  Yanelli Zapanta Esther Hardy, MD [facility] [place] Pager: [phone] Office: [phone] If no answer Cell [phone]

## 2017-09-21 ENCOUNTER — Encounter (HOSPITAL_COMMUNITY): Payer: Medicare Other

## 2017-09-21 DIAGNOSIS — J449 Chronic obstructive pulmonary disease, unspecified: Secondary | ICD-10-CM

## 2017-09-21 LAB — CBC
HEMATOCRIT: 41.9 % (ref 39.0–52.0)
Hemoglobin: 14.2 g/dL (ref 13.0–17.0)
MCH: 29.4 pg (ref 26.0–34.0)
MCHC: 33.9 g/dL (ref 30.0–36.0)
MCV: 86.7 fL (ref 78.0–100.0)
Platelets: 146 10*3/uL — ABNORMAL LOW (ref 150–400)
RBC: 4.83 MIL/uL (ref 4.22–5.81)
RDW: 15 % (ref 11.5–15.5)
WBC: 9.7 10*3/uL (ref 4.0–10.5)

## 2017-09-21 LAB — RENAL FUNCTION PANEL
ALBUMIN: 3.1 g/dL — AB (ref 3.5–5.0)
ANION GAP: 8 (ref 5–15)
BUN: 39 mg/dL — ABNORMAL HIGH (ref 6–20)
CALCIUM: 8.6 mg/dL — AB (ref 8.9–10.3)
CO2: 24 mmol/L (ref 22–32)
CREATININE: 2.46 mg/dL — AB (ref 0.61–1.24)
Chloride: 107 mmol/L (ref 101–111)
GFR, EST AFRICAN AMERICAN: 28 mL/min — AB (ref >60)
GFR, EST NON AFRICAN AMERICAN: 24 mL/min — AB (ref >60)
Glucose, Bld: 162 mg/dL — ABNORMAL HIGH (ref 65–99)
PHOSPHORUS: 4.3 mg/dL (ref 2.5–4.6)
Potassium: 4.1 mmol/L (ref 3.5–5.1)
Sodium: 139 mmol/L (ref 135–145)

## 2017-09-21 LAB — GLUCOSE, CAPILLARY
GLUCOSE-CAPILLARY: 154 mg/dL — AB (ref 65–99)
GLUCOSE-CAPILLARY: 151 mg/dL — AB (ref 65–99)
Glucose-Capillary: 218 mg/dL — ABNORMAL HIGH (ref 65–99)
Glucose-Capillary: 198 mg/dL — ABNORMAL HIGH (ref 65–99)

## 2017-09-21 LAB — BRAIN NATRIURETIC PEPTIDE: B NATRIURETIC PEPTIDE 5: 237.3 pg/mL — AB (ref 0.0–100.0)

## 2017-09-21 LAB — HEPARIN LEVEL (UNFRACTIONATED): HEPARIN UNFRACTIONATED: 0.43 [IU]/mL (ref 0.30–0.70)

## 2017-09-21 NOTE — Progress Notes (Addendum)
Subjective:  No chest pain Breathing stable. S/p diagnostic angiogram 11/20  In sinus rhythm this morning. Mood improved Cr increased to 2.46.   Objective:  Vital Signs in the last 24 hours: Temp:  [97.4 F (36.3 C)-98 F (36.7 C)] 98 F (36.7 C) (11/24 0410) Pulse Rate:  [87-102] 87 (11/24 0410) Resp:  [16-24] 24 (11/24 0904) BP: (110-164)/(52-93) 128/81 (11/24 0410) SpO2:  [92 %-95 %] 94 % (11/24 0410) Weight:  [95.2 kg (209 lb 14.4 oz)] 95.2 kg (209 lb 14.4 oz) (11/24 0410)  Intake/Output from previous day: 11/23 0701 - 11/24 0700 In: 480 [P.O.:480] Out: 300 [Urine:300] Intake/Output from this shift: Total I/O In: 240 [P.O.:240] Out: -   Physical Exam: HENT:  Head: Normocephalic and atraumatic.  Eyes: Conjunctivae and EOM are normal.  Neck: No JVD Cardiovascular: VariableS1. Soft S2. Irregularly irregular. III/VI crescendo murmur aortic area Vascular exam: Bilateral carotid bruit present. Femoral pulse difficult to feel due to obesity. Left popliteal normal and right faint. Absent pedal pulses bilateral Trace b/l edema Respiratory: No stridor. He has bibasilar rales GI: Soft.  Ventral hernia noted. BS present in all 4 quadrants Musculoskeletal: Normal range of motion. He exhibits no edema.  Lymphadenopathy: He has no cervical adenopathy.  Neurological: He is alert and oriented to person, place, and time.  Skin: Skin is warm and dry.      Lab Results: Recent Labs    09/20/17 0253 09/21/17 0347  WBC 10.3 9.7  HGB 14.8 14.2  PLT 128* 146*   Recent Labs    09/20/17 0253 09/21/17 0347  NA 140 139  K 4.1 4.1  CL 109 107  CO2 23 24  GLUCOSE 142* 162*  BUN 32* 39*  CREATININE 2.32* 2.46*    Imaging: CXR 09/14/2017 CLINICAL DATA:  Chest pain  EXAM: PORTABLE CHEST 1 VIEW  COMPARISON:  12/26/2013  FINDINGS: Cardiomegaly with interstitial coarsening diffusely. Vascular pedicle widening. Status post CABG. Remote right rib fractures.  No effusion or pneumothorax.  IMPRESSION: CHF pattern, mild.   Electronically Signed   By: Monte Fantasia M.D.   On: 09/14/2017 16:45  Cardiac Studies: Echocardiogram 09/15/2017: Study Conclusions  - Left ventricle: Wall thickness was increased in a pattern of   severe LVH. There was concentric hypertrophy. The estimated   ejection fraction was in the range of 55% to 60%. Septal wall   dyskinesis. Doppler parameters are consistent with grade II   diastolic dysfunction, elevated LAP. - Aortic valve: Severely calcified annulus. Severely calcified   leaflets. Cusp separation was reduced. There was severe stenosis.   Valve area (Vmax): 0.9 cm^2. Vmax 4.1 cm, Mean PG 41 mmHg. - Mitral valve: Moderately calcified annulus. Moderately calcified   leaflets with restricted movement of posterior mitral valve   leaflet. The findings are consistent with trivial stenosis. - Left atrium: The atrium was moderately dilated. - Right ventricle: The cavity size was mildly dilated. - Right atrium: The atrium was mildly dilated.  Impressions:  - Preserved LV systolic function s/p CABG. Grade II diastolic   dysfunction, elevated LAP. Severe aortic stenosis. Trace calcific   mitral stenosis. Biatrial enlargement. Mild RV dilatation with   preserved systolic function.  Diagnostic angiogram RHC/Coronary + Bypass graft 09/17/2017 Procedures   RIGHT HEART CATH AND CORONARY/GRAFT ANGIOGRAPHY  Conclusion   Severe native vessel CAD Patent grafts 4/4  (LIMA-LAD, seq SVG-PDA, PLA, seq SVG-OM1, OM2, SVG-Diag1) Severe focal 90% stenosis SVG-Diag1 WHO Grp II Pulmonary hypertension Mean PA 32 mmhg  Recommendation: Heart  team discussion regarding management of CAD and severe aortic stenosis Continue ASA/brilinta and heparin.     Assessment:  NSTEMI: Type 1 vs type 2 in the setting of severe AS Severe AS CAD s/p CABG 2002: Severe native vessel CAD. 4/4 grafts patent. 90% focal SVG-Diag  stenosis Paroxysmal Atrial fibrilation with RVR  CHA2DS2VAsc score 4. Annual stroke risk 4.8% Hypertension: Better controlled Pulmonary hypertension WHO Grp II Uncontrolled type 2 DM AKI/CKD: Cr increased to 2.46 Cr 2.0 in 05/2017, and 2.2 in 08/2017 per outpatient records.  Depression Hyperlipidemia B/l carotid bruit  Plan: Continue ASA//plavix/heparin Possible discharge on plavix and xarelto 15 mg daily. Continue metoprolol 37.5 mg bid, hydralazine 25 mg tid, Imdur 60 mg, crestor 40 mg. Continue celexa 10 mg during day, trazodone 25 mg at night No fluids/lasix today. Maintain PO hydration. I suspect his Cr will plateau and improve. Will give fluids on Sunday in preparation for cath on 11/26. I have had extensive discussion with him regarding the risks of the procedure, including but not limited to bleeding, infection, arrhythmia, vessel closure- especially vein graft. 2-3% risk of dialysis. 1-2% risk of requiring mechanical ventilation for pulmonary edema. He is willing to undergo temporary mechanical ventilation, and/or dialysis, as necessary.  Appreciate [name] input. Workup and TAVR likely outpatient.  Hospitalist and nephrology consult aprpeciated.   LOS: 7 days    Manish J Patwardhan 09/21/2017, 9:26 AM  [place], MD [facility] [place] Pager: [phone] Office: [phone] If no answer Cell [phone]

## 2017-09-21 NOTE — Progress Notes (Signed)
Subjective:  Only 300 of UOP recorded- pt says more- crt stable but technically up from 2.32-2.46- weight is down a kg.  BP overall better but does have occas spikes "I just want to get the he** out of here"  Objective Vital signs in last 24 hours: Vitals:   09/21/17 0400 09/21/17 0410 09/21/17 0500 09/21/17 0600  BP:  128/81    Pulse:  87    Resp: 18 20 20 18   Temp:  98 F (36.7 C)    TempSrc:  Oral    SpO2:  94%    Weight:  95.2 kg (209 lb 14.4 oz)    Height:       Weight change: -0.953 kg (-1.6 oz)  Intake/Output Summary (Last 24 hours) at 09/21/2017 3149 Last data filed at 09/20/2017 1700 Gross per 24 hour  Intake 480 ml  Output 300 ml  Net 180 ml    Assessment/ Plan: Pt is a 76 y.o. 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 Renal- actually  renal function staying pretty stable from pre cath to today, running from 2.2 to 2.5 this hosp this might actually be his new baseline (the 2.0 was 3 years ago).  He is making urine.   He does tell me  that he would likely refuse dialysis so this needs to be a component of decision making for this pt.  Planning on PCI now maybe Monday- as above not sure creatinine will get much better than where it is now.  There is slight risk to dye based procedure- I would say 5% risk of it being dialysis requiring, likely temporarily  but if needs procedure dont think this should be contraindication 2. BP/vol- BP is better - toprol and imdur as well as scheduled hydralazine.   BP variable- Volume likely playing a part BUT if we get volume down then creatinine may get to 2.5 or higher (not reflecting worse kidney function but just different volume status) 3. Cards-plan for PCI eventually and then TAVR as OP.   See above that pt would refuse HD so would really think hard about procedure that could put kidney function at risk 4. Anemia- not an issue  5.  Secondary hyperparathyroidism- eventually will follow with Dr. Joelyn Oms and he can check these labs and treat as needed  I am not going to round on pt 11/25- Dr. Florene Glen will resume our following on 11/26- call with any questions tomorrow   Zaylee Cornia A    Labs: Basic Metabolic Panel: Recent Labs  Lab 09/19/17 0250 09/20/17 0253 09/21/17 0347  NA 140 140 139  K 3.7 4.1 4.1  CL 108 109 107  CO2 24 23 24   GLUCOSE 98 142* 162*  BUN 30* 32* 39*  CREATININE 2.27* 2.32* 2.46*  CALCIUM 8.8* 8.6* 8.6*  PHOS  --  3.5 4.3   Liver Function Tests: Recent Labs  Lab 09/14/17 1618 09/15/17 0522 09/20/17 0253 09/21/17 0347  AST 14* 12*  --   --   ALT 17 13*  --   --   ALKPHOS 92 78  --   --   BILITOT 0.5 0.6  --   --   PROT 7.2 6.2*  --   --   ALBUMIN 3.5 3.2* 3.0* 3.1*   No results for input(s): LIPASE, AMYLASE in the last 168 hours. No results for input(s): AMMONIA in the last  168 hours. CBC: Recent Labs  Lab 09/14/17 1618  09/17/17 0241 09/18/17 0324 09/19/17 0250 09/20/17 0253 09/21/17 0347  WBC 7.0   < > 7.9 8.3 9.8 10.3 9.7  NEUTROABS 4.0  --   --   --   --   --   --   HGB 16.4   < > 15.8 14.3 15.0 14.8 14.2  HCT 48.2   < > 47.7 44.5 44.8 44.4 41.9  MCV 85.8   < > 88.5 87.9 86.7 87.1 86.7  PLT 132*   < > 137* 149* 133* 128* 146*   < > = values in this interval not displayed.   Cardiac Enzymes: Recent Labs  Lab 09/14/17 1801 09/14/17 2245 09/15/17 0522  TROPONINI 0.05* <0.03 0.18*   CBG: Recent Labs  Lab 09/20/17 0646 09/20/17 1227 09/20/17 1755 09/20/17 2101 09/21/17 0555  GLUCAP 122* 209* 217* 168* 154*    Iron Studies: No results for input(s): IRON, TIBC, TRANSFERRIN, FERRITIN in the last 72 hours. Studies/Results: No results found. Medications: Infusions: . sodium chloride    . heparin 1,700 Units/hr (09/20/17 1740)    Scheduled Medications: . aspirin  81 mg Oral Daily  . citalopram  10 mg Oral Daily  . clopidogrel  75 mg Oral  Daily  . hydrALAZINE  25 mg Oral Q8H  . insulin aspart  0-20 Units Subcutaneous TID WC  . insulin NPH Human  10 Units Subcutaneous BID AC & HS  . isosorbide mononitrate  60 mg Oral Daily  . metoprolol tartrate  37.5 mg Oral BID  . rosuvastatin  40 mg Oral q1800  . sodium chloride flush  3 mL Intravenous Q12H  . traZODone  25 mg Oral QHS    have reviewed scheduled and prn medications.  Physical Exam: General: obese, comfortable Heart: RRR Lungs: mostly clear Abdomen: soft, non tender Extremities: pitting to dep areas- moreso on right     09/21/2017,8:11 AM  LOS: 7 days        Subjective:  Cath= severe native vessel CAD, patent grafts- severe focal 90% stenosis svg-[id number] also pulmonary HTN and severe aortic stenosis- now looks like he will get PCI on Friday of one lesion THEN work up for TAVR.  Had 1900 of UOP and crt fairly stable from pre procedure.  Lucious Groves made him crazy   Objective Vital signs in last 24 hours: Vitals:   09/21/17 0400 09/21/17 0410 09/21/17 0500 09/21/17 0600  BP:  128/81    Pulse:  87    Resp: 18 20 20 18   Temp:  98 F (36.7 C)    TempSrc:  Oral    SpO2:  94%    Weight:  95.2 kg (209 lb 14.4 oz)    Height:       Weight change: -0.953 kg (-1.6 oz)  Intake/Output Summary (Last 24 hours) at 09/21/2017 4782 Last data filed at 09/20/2017 1700 Gross per 24 hour  Intake 480 ml  Output 300 ml  Net 180 ml    Assessment/ Plan: Pt is a 76 y.o. 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 Renal- fortunately renal function stayed stable from pre cath to today, making urine.  GFR stable in high 20's /low 30's.  He does tell me  that he would likely refuse dialysis so this needs to be a component of decision making for this pt.  Planning on PCI tomorrow 2. BP/vol- BP  is generous - toprol and imdur as well as PRN hydralazine.  He is resitant to change in meds "they have been  changed" "my BP is up b/c I am upset"  Would not resume lasix yet as will get PCI tomorrow- I would add on some PO scheduled hydralazine he wants that to be up to the cardiologists 3. Cards-plan for PCI tomorrow and eventual TAVR.  See above that pt would refuse HD so would really think hard about procedure that could put kidney function at risk 4. Anemia- not an issue  5. Secondary hyperparathyroidism- eventually will follow with Dr. Joelyn Oms and he can check these labs and treat as needed  [place]: Basic Metabolic Panel: Recent Labs  Lab 09/19/17 0250 09/20/17 0253 09/21/17 0347  NA 140 140 139  K 3.7 4.1 4.1  CL 108 109 107  CO2 24 23 24   GLUCOSE 98 142* 162*  BUN 30* 32* 39*  CREATININE 2.27* 2.32* 2.46*  CALCIUM 8.8* 8.6* 8.6*  PHOS  --  3.5 4.3   Liver Function Tests: Recent Labs  Lab 09/14/17 1618 09/15/17 0522 09/20/17 0253 09/21/17 0347  AST 14* 12*  --   --   ALT 17 13*  --   --   ALKPHOS 92 78  --   --   BILITOT 0.5 0.6  --   --   PROT 7.2 6.2*  --   --   ALBUMIN 3.5 3.2* 3.0* 3.1*   No results for input(s): LIPASE, AMYLASE in the last 168 hours. No results for input(s): AMMONIA in the last 168 hours. CBC: Recent Labs  Lab 09/14/17 1618  09/17/17 0241 09/18/17 0324 09/19/17 0250 09/20/17 0253 09/21/17 0347  WBC 7.0   < > 7.9 8.3 9.8 10.3 9.7  NEUTROABS 4.0  --   --   --   --   --   --   HGB 16.4   < > 15.8 14.3 15.0 14.8 14.2  HCT 48.2   < > 47.7 44.5 44.8 44.4 41.9  MCV 85.8   < > 88.5 87.9 86.7 87.1 86.7  PLT 132*   < > 137* 149* 133* 128* 146*   < > = values in this interval not displayed.   Cardiac Enzymes: Recent Labs  Lab 09/14/17 1801 09/14/17 2245 09/15/17 0522  TROPONINI 0.05* <0.03 0.18*   CBG: Recent Labs  Lab 09/20/17 0646 09/20/17 1227 09/20/17 1755 09/20/17 2101 09/21/17 0555  GLUCAP 122* 209* 217* 168* 154*    Iron Studies: No results for input(s): IRON, TIBC, TRANSFERRIN, FERRITIN in the last  72 hours. Studies/Results: No results found. Medications: Infusions: . sodium chloride    . heparin 1,700 Units/hr (09/20/17 1740)    Scheduled Medications: . aspirin  81 mg Oral Daily  . citalopram  10 mg Oral Daily  . clopidogrel  75 mg Oral Daily  . hydrALAZINE  25 mg Oral Q8H  . insulin aspart  0-20 Units Subcutaneous TID WC  . insulin NPH Human  10 Units Subcutaneous BID AC & HS  . isosorbide mononitrate  60 mg Oral Daily  . metoprolol tartrate  37.5 mg Oral BID  . rosuvastatin  40 mg Oral q1800  . sodium chloride flush  3 mL Intravenous Q12H  . traZODone  25 mg Oral QHS    have reviewed scheduled and prn medications.  Physical Exam: General: obese, comfortable Heart: RRR Lungs: mostly clear Abdomen: soft, non tender Extremities: pitting to dep areas-  moreso on right     09/21/2017,8:11 AM  LOS: 7 days

## 2017-09-21 NOTE — Progress Notes (Signed)
PT Cancellation Note  Patient Details Name: Cameron [name] MRN: [id number] DOB: 09-Jul-1941   Cancelled Treatment:    Reason Eval/Treat Not Completed: Patient declined, no reason specified. Gathered supplies for pre-TAVR assessment. Pt with multiple visitors in room. Explained we had been asked to perform assessment used prior to having a TAVR. Pt stating he wasn't sure he was going to have a TAVR. Explained I understood that. Pt declined assessment at this time. Will check back with patient on Monday.  Shary Decamp Maycok 09/21/2017, 3:22 PM  [employer] PT [phone]

## 2017-09-21 NOTE — Progress Notes (Signed)
ANTICOAGULATION CONSULT NOTE - Follow Up Consult  Pharmacy Consult for Heparin Indication: chest pain/ACS and afib  Allergies  Allergen Reactions  . Cefuroxime Diarrhea  . Ezetimibe Diarrhea  . Pravastatin Other (See Comments)    myalgias    Patient Measurements: Height: 5\' 9"  (175.3 cm) Weight: 209 lb 14.4 oz (95.2 kg) IBW/kg (Calculated) : 70.7 Heparin Dosing Weight: 92 kg  Vital Signs: Temp: 98 F (36.7 C) (11/24 0410) Temp Source: Oral (11/24 0410) BP: 128/81 (11/24 0410) Pulse Rate: 87 (11/24 0410)  Labs: Recent Labs    09/19/17 0250 09/19/17 0933 09/20/17 0253 09/21/17 0347  HGB 15.0  --  14.8 14.2  HCT 44.8  --  44.4 41.9  PLT 133*  --  128* 146*  HEPARINUNFRC  --  0.43 0.51 0.43  CREATININE 2.27*  --  2.32* 2.46*    Estimated Creatinine Clearance: 28.6 mL/min (A) (by C-G formula based on SCr of 2.46 mg/dL (H)).   Medications: Heparin @ 1700 units/hr  Assessment: 76yom s/p cath 11/20 found to have 90% stenosed SVG-Diag. Heparin resumed post-cath with initial plan for staged PCI 11/23, however, procedure delayed to 11/26 due to renal function. Heparin level is therapeutic at 0.43. CBC stable. No bleeding reported.  Goal of Therapy:  Heparin level 0.3-0.7 units/ml Monitor platelets by anticoagulation protocol: Yes   Plan:  1) Continue heparin at 1700 units/hr 2) Daily heparin level and CBC 3) Follow up plan for oral AC for afib after cath  Nena Jordan, PharmD, BCPS  09/21/2017 11:05 AM

## 2017-09-21 NOTE — Progress Notes (Signed)
PROGRESS NOTE  Cameron [name] NFA:[id number] DOB: 08-Sep-1941 DOA: 09/14/2017 PCP: Parke Poisson, MD  Brief Narrative: 53yom PMH CAD s/p CABG, presented with chest pain. Admitted by cardiology for ACS, medicine consulted for assistance with medical management of diabetes. Treated for ACS. S/p [facility] 11/20.  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 Hgb A1c 9.1 %. - no lows. Well-controlled. 8 units SSI last 24 hours. No change NPH dosing today. - will follow-up in AM  Diarrhea - resolved. No further evaluation suggested.  COPD, PMH tobacco use -  Stable   Thrombocytopenia - stable. Present since 2014; recommend considering outpatient evaluation. No further inpatient recommendations.  ACS, unstable angina pectoris, NSTEMI - per cardiology   Severe native vessel CAD by cath this admission; s/p CABG 2000 - per cardiology   Severe aortic stenosis  - per cardiology  AKI/CKD stage III with reported baseline creatinine of 2.0. - per nephrology   Renal cysts. - radiology recommended outpt CT or MRI to further characterize.   DVT prophylaxis: heparin infusion Code Status: full Family Communication: none Disposition Plan: home    Murray Hodgkins, MD  [facility] Direct contact: [phone] --[place] [place]  --[email or web address]; password [email or web address]  7PM-7AM contact night coverage as above 09/21/2017, 3:36 PM  LOS: 7 days   Consultants:  Nephrology   TRH  Procedures:  Echo  Impressions:  - Preserved LV systolic function s/p CABG. Grade II diastolic dysfunction, elevated LAP. Severe aortic stenosis. Trace calcific mitral stenosis. Biatrial enlargement. Mild RV dilatation with preserved systolic function.  R/LHC Conclusion   Severe native vessel CAD Patent grafts 4/4  (LIMA-LAD, seq SVG-PDA, PLA, seq SVG-OM1, OM2, SVG-Diag1) Severe focal 90% stenosis SVG-Diag1 WHO Grp II Pulmonary hypertension Mean PA 32  mmhg  Recommendation: Heart team discussion regarding management of CAD and severe aortic stenosis Continue ASA/brilinta and heparin.    Antimicrobials:    Interval history/Subjective: No diarrhea.  Objective: Vitals:  Vitals:   09/21/17 1148 09/21/17 1501  BP: 112/60 117/62  Pulse:    Resp: 15   Temp: 98.2 F (36.8 C)   SpO2: 92%     Exam:  Constitutional:   . Appears calm and comfortable Respiratory:  . CTA bilaterally, no w/r/r.  . Respiratory effort normal.  Cardiovascular:  . RRR, no m/r/g. Telemetry SR. Psychiatric:  . Mental status o Mood, affect appropriate  I have personally reviewed the following:   Labs:  CBG stable, no lows  BMP noted, creatining 2.12 >> 2.27 >> 2.23 >> 2.46  CBC noted. Plts 149 >> 133 >> 128 >> 146  Scheduled Meds: . aspirin  81 mg Oral Daily  . citalopram  10 mg Oral Daily  . clopidogrel  75 mg Oral Daily  . hydrALAZINE  25 mg Oral Q8H  . insulin aspart  0-20 Units Subcutaneous TID WC  . insulin NPH Human  10 Units Subcutaneous BID AC & HS  . isosorbide mononitrate  60 mg Oral Daily  . metoprolol tartrate  37.5 mg Oral BID  . rosuvastatin  40 mg Oral q1800  . sodium chloride flush  3 mL Intravenous Q12H  . traZODone  25 mg Oral QHS   Continuous Infusions: . sodium chloride    . heparin 1,700 Units/hr (09/21/17 1038)    Active Problems:   Insulin dependent type 2 diabetes mellitus, uncontrolled (HCC)   Unstable angina (HCC)   CKD (chronic kidney disease) stage 3, GFR 30-59 ml/min (HCC)   Thrombocytopenia ([place])  Severe aortic stenosis   LOS: 7 days

## 2017-09-22 ENCOUNTER — Encounter (HOSPITAL_COMMUNITY): Payer: Medicare Other

## 2017-09-22 LAB — GLUCOSE, CAPILLARY
GLUCOSE-CAPILLARY: 144 mg/dL — AB (ref 65–99)
GLUCOSE-CAPILLARY: 208 mg/dL — AB (ref 65–99)
Glucose-Capillary: 221 mg/dL — ABNORMAL HIGH (ref 65–99)
Glucose-Capillary: 208 mg/dL — ABNORMAL HIGH (ref 65–99)

## 2017-09-22 LAB — RENAL FUNCTION PANEL
ALBUMIN: 3.1 g/dL — AB (ref 3.5–5.0)
Anion gap: 9 (ref 5–15)
BUN: 46 mg/dL — AB (ref 6–20)
CHLORIDE: 107 mmol/L (ref 101–111)
CO2: 20 mmol/L — ABNORMAL LOW (ref 22–32)
CREATININE: 2.61 mg/dL — AB (ref 0.61–1.24)
Calcium: 8.5 mg/dL — ABNORMAL LOW (ref 8.9–10.3)
GFR, EST AFRICAN AMERICAN: 26 mL/min — AB (ref >60)
GFR, EST NON AFRICAN AMERICAN: 22 mL/min — AB (ref >60)
Glucose, Bld: 156 mg/dL — ABNORMAL HIGH (ref 65–99)
PHOSPHORUS: 4 mg/dL (ref 2.5–4.6)
Potassium: 3.9 mmol/L (ref 3.5–5.1)
Sodium: 136 mmol/L (ref 135–145)

## 2017-09-22 LAB — HEPARIN LEVEL (UNFRACTIONATED): HEPARIN UNFRACTIONATED: 0.4 [IU]/mL (ref 0.30–0.70)

## 2017-09-22 LAB — CBC
HEMATOCRIT: 40.4 % (ref 39.0–52.0)
Hemoglobin: 13.3 g/dL (ref 13.0–17.0)
MCH: 28.9 pg (ref 26.0–34.0)
MCHC: 32.9 g/dL (ref 30.0–36.0)
MCV: 87.6 fL (ref 78.0–100.0)
PLATELETS: 143 10*3/uL — AB (ref 150–400)
RBC: 4.61 MIL/uL (ref 4.22–5.81)
RDW: 15.3 % (ref 11.5–15.5)
WBC: 9.4 10*3/uL (ref 4.0–10.5)

## 2017-09-22 LAB — CREATININE, SERUM
Creatinine, Ser: 2.58 mg/dL — ABNORMAL HIGH (ref 0.61–1.24)
GFR, EST AFRICAN AMERICAN: 26 mL/min — AB (ref >60)
GFR, EST NON AFRICAN AMERICAN: 22 mL/min — AB (ref >60)

## 2017-09-22 LAB — BUN: BUN: 43 mg/dL — ABNORMAL HIGH (ref 6–20)

## 2017-09-22 MED ORDER — SODIUM BICARBONATE 8.4 % IV SOLN
INTRAVENOUS | Status: DC
  Filled 2017-09-22: qty 1000

## 2017-09-22 MED ORDER — SODIUM CHLORIDE 0.9 % IV SOLN: 250.0000 mL | INTRAVENOUS | Status: DC | PRN

## 2017-09-22 MED ORDER — SODIUM CHLORIDE 0.9% FLUSH
3.0000 mL | Freq: Two times a day (BID) | INTRAVENOUS | Status: DC
  Administered 2017-09-22: 3 mL via INTRAVENOUS

## 2017-09-22 MED ORDER — SODIUM CHLORIDE 0.9 % IV SOLN
INTRAVENOUS | Status: AC
  Administered 2017-09-22 – 2017-09-23 (×2): via INTRAVENOUS

## 2017-09-22 MED ORDER — SODIUM CHLORIDE 0.9% FLUSH
3.0000 mL | INTRAVENOUS | Status: DC | PRN
Start: 2017-09-22 — End: 2017-09-23

## 2017-09-22 MED ORDER — ASPIRIN 81 MG PO CHEW
81.0000 mg | CHEWABLE_TABLET | ORAL | Status: AC
  Administered 2017-09-23: 81 mg via ORAL
  Filled 2017-09-22: qty 1

## 2017-09-22 MED ORDER — FUROSEMIDE 20 MG PO TABS
20.0000 mg | ORAL_TABLET | Freq: Once | ORAL | Status: AC
  Administered 2017-09-22: 20 mg via ORAL
  Filled 2017-09-22: qty 1

## 2017-09-22 MED ORDER — SODIUM BICARBONATE BOLUS VIA INFUSION
INTRAVENOUS | Status: AC
  Administered 2017-09-23: 275 meq via INTRAVENOUS
  Filled 2017-09-22: qty 1

## 2017-09-22 MED ORDER — LOPERAMIDE HCL 2 MG PO CAPS
2.0000 mg | ORAL_CAPSULE | Freq: Every day | ORAL | Status: DC
  Administered 2017-09-22 – 2017-09-24 (×2): 2 mg via ORAL
  Filled 2017-09-22 (×3): qty 1

## 2017-09-22 NOTE — Progress Notes (Signed)
ANTICOAGULATION CONSULT NOTE - Follow Up Consult  Pharmacy Consult for Heparin Indication: chest pain/ACS and afib  Allergies  Allergen Reactions  . Cefuroxime Diarrhea  . Ezetimibe Diarrhea  . Pravastatin Other (See Comments)    myalgias    Patient Measurements: Height: 5\' 9"  (175.3 cm) Weight: 209 lb 14.4 oz (95.2 kg) IBW/kg (Calculated) : 70.7 Heparin Dosing Weight: 92 kg  Vital Signs: Temp: 97.7 F (36.5 C) (11/25 0647) Temp Source: Oral (11/25 0647) BP: 129/60 (11/25 0941) Pulse Rate: 85 (11/25 0941)  Labs: Recent Labs    09/20/17 0253 09/21/17 0347 09/22/17 0214  HGB 14.8 14.2 13.3  HCT 44.4 41.9 40.4  PLT 128* 146* 143*  HEPARINUNFRC 0.51 0.43 0.40  CREATININE 2.32* 2.46* 2.61*    Estimated Creatinine Clearance: 27 mL/min (A) (by C-G formula based on SCr of 2.61 mg/dL (H)).   Medications: Heparin @ 1700 units/hr  Assessment: 76yom s/p cath 11/20 found to have 90% stenosed SVG-Diag. Heparin resumed post-cath with initial plan for staged PCI 11/23, however, procedure delayed to 11/26 due to renal function. Heparin level is therapeutic at 0.40. CBC stable. No bleeding reported.  Sodium bicarbonate CIN prevention orders placed for 11/26.  Goal of Therapy:  Heparin level 0.3-0.7 units/ml Monitor platelets by anticoagulation protocol: Yes   Plan:  1) Continue heparin at 1700 units/hr 2) Daily heparin level and CBC 3) Follow up plan for oral AC for afib after cath  Nena Jordan, PharmD, BCPS  09/22/2017 10:37 AM

## 2017-09-22 NOTE — Progress Notes (Signed)
Subjective:  One episode of chest pain 111/25 am. Resolved with SL NTG Breathing stable. S/p diagnostic angiogram 11/20  In sinus rhythm this morning. Mood improved Cr increased to 2.61. Complains of brown watery diarrhea.  No output measured on 11/23 afternoon and 11/24 all day.   Objective:  Vital Signs in the last 24 hours: Temp:  [97.7 F (36.5 C)-98.2 F (36.8 C)] 97.7 F (36.5 C) (11/25 0647) Pulse Rate:  [72-75] 72 (11/25 0647) Resp:  [14-17] 17 (11/25 0647) BP: (112-129)/(51-62) 129/51 (11/25 0647) SpO2:  [92 %-95 %] 92 % (11/25 0647)   Physical Exam: HENT:  Head: Normocephalic and atraumatic.  Eyes: Conjunctivae and EOM are normal.  Neck: No JVD Cardiovascular: VariableS1. Soft S2. Irregularly irregular. III/VI crescendo murmur aortic area Vascular exam: Bilateral carotid bruit present. Femoral pulse difficult to feel due to obesity. Left popliteal normal and right faint. Absent pedal pulses bilateral Trace b/l edema Respiratory: No stridor. He has bibasilar rales GI: Soft.  Ventral hernia noted. BS present in all 4 quadrants Musculoskeletal: Normal range of motion. He exhibits no edema.  Lymphadenopathy: He has no cervical adenopathy.  Neurological: He is alert and oriented to person, place, and time.  Skin: Skin is warm and dry.      Lab Results: Recent Labs    09/21/17 0347 09/22/17 0214  WBC 9.7 9.4  HGB 14.2 13.3  PLT 146* 143*   Recent Labs    09/21/17 0347 09/22/17 0214  NA 139 136  K 4.1 3.9  CL 107 107  CO2 24 20*  GLUCOSE 162* 156*  BUN 39* 46*  CREATININE 2.46* 2.61*    Imaging: CXR 09/14/2017 CLINICAL DATA:  Chest pain  EXAM: PORTABLE CHEST 1 VIEW  COMPARISON:  12/26/2013  FINDINGS: Cardiomegaly with interstitial coarsening diffusely. Vascular pedicle widening. Status post CABG. Remote right rib fractures. No effusion or pneumothorax.  IMPRESSION: CHF pattern, mild.   Electronically Signed   By:  Monte Fantasia M.D.   On: 09/14/2017 16:45  Cardiac Studies: Echocardiogram 09/15/2017: Study Conclusions  - Left ventricle: Wall thickness was increased in a pattern of   severe LVH. There was concentric hypertrophy. The estimated   ejection fraction was in the range of 55% to 60%. Septal wall   dyskinesis. Doppler parameters are consistent with grade II   diastolic dysfunction, elevated LAP. - Aortic valve: Severely calcified annulus. Severely calcified   leaflets. Cusp separation was reduced. There was severe stenosis.   Valve area (Vmax): 0.9 cm^2. Vmax 4.1 cm, Mean PG 41 mmHg. - Mitral valve: Moderately calcified annulus. Moderately calcified   leaflets with restricted movement of posterior mitral valve   leaflet. The findings are consistent with trivial stenosis. - Left atrium: The atrium was moderately dilated. - Right ventricle: The cavity size was mildly dilated. - Right atrium: The atrium was mildly dilated.  Impressions:  - Preserved LV systolic function s/p CABG. Grade II diastolic   dysfunction, elevated LAP. Severe aortic stenosis. Trace calcific   mitral stenosis. Biatrial enlargement. Mild RV dilatation with   preserved systolic function.  Diagnostic angiogram RHC/Coronary + Bypass graft 09/17/2017 Procedures   RIGHT HEART CATH AND CORONARY/GRAFT ANGIOGRAPHY  Conclusion   Severe native vessel CAD Patent grafts 4/4  (LIMA-LAD, seq SVG-PDA, PLA, seq SVG-OM1, OM2, SVG-Diag1) Severe focal 90% stenosis SVG-Diag1 WHO Grp II Pulmonary hypertension Mean PA 32 mmhg  Recommendation: Heart team discussion regarding management of CAD and severe aortic stenosis Continue ASA/brilinta and heparin.     Assessment:  NSTEMI: Type 1 vs type 2 in the setting of severe AS Severe AS CAD s/p CABG 2002: Severe native vessel CAD. 4/4 grafts patent. 90% focal SVG-Diag stenosis Paroxysmal Atrial fibrilation with RVR  CHA2DS2VAsc score 4. Annual stroke risk  4.8% Hypertension: Better controlled Pulmonary hypertension WHO Grp II Uncontrolled type 2 DM AKI/CKD: Cr increased to 2.61. Likely combination of baseline CKD, aortic stenosis, and contrast use. Cr 2.0 in 05/2017, and 2.2 in 08/2017 per outpatient records.  Urine output not accurately measured for 3 shifts, patient is using the bathroom. I have emphasized the need to accurately measure urine output to the patient, nursing staff, and care partner. It is vitally important that we measure accurate I/O, as patient is at high risk for worsening of the CKD, and management plan for PCI will depend on his renal function and urine output. Stable thrombocytopenia: Chronic. No bleeding Diarrhea: Brown, watery. Depression Hyperlipidemia B/l carotid bruit  Plan: Continue ASA//plavix/heparin Possible discharge on plavix and xarelto 15 mg daily. Continue metoprolol 37.5 mg bid, hydralazine 25 mg tid, Imdur 60 mg, crestor 40 mg. Continue celexa 10 mg during day, trazodone 25 mg at night. Recommend gentle hydration, and 20 mg PO lasix during to maintain adequate urine output. If creatinine improving and urine output maintained, would cautiously proceed with PCI plan for 11/26. I think his creatinine today is reflective of contrast use on 11/20, and use of lasix on 11/23. If Cr worsens, will have to hold plan for PCI. Patient is aware of the implications on his renal function, and aware that temporary dialysis is a possibility after PCI.  I have had extensive discussion with him regarding the risks of the procedure, including but not limited to bleeding, infection, arrhythmia, vessel closure- especially vein graft. 2-3% risk of dialysis. 1-2% risk of requiring mechanical ventilation for pulmonary edema. He is willing to undergo temporary mechanical ventilation, and/or dialysis, as necessary.  Diet changed to diabetic diet. Loperamide for diarrhea. Appreciate [name] input. Workup and TAVR likely outpatient.   Hospitalist and nephrology consult aprpeciated.   LOS: 8 days    Sy Saintjean J Zacherie Honeyman 09/22/2017, 9:34 AM  [place], MD [facility] [place] Pager: [phone] Office: [phone] If no answer Cell [phone]

## 2017-09-22 NOTE — Progress Notes (Signed)
PROGRESS NOTE  Cameron [name] DPO:[id number] DOB: Jul 31, 1941 DOA: 09/14/2017 PCP: Parke Poisson, MD  Brief Narrative: 34yom PMH CAD s/p CABG, presented with chest pain. Admitted by cardiology for ACS, medicine consulted for assistance with medical management of diabetes. Treated for ACS. S/p [facility] 11/20.  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 Hgb A1c 9.1 %. - control adequate, no lows or CBG above 250. Continue current dosing of NPH. Continue SSI. - as DM has been stable, will sign off. Note that dosing is significantly lower than home, presumably because of poor appetite. On discharge would recommend resuming home dosing. - please call [facility] if we can be of further assistance.  Diarrhea - resolved. No further evaluation suggested.  COPD, PMH tobacco use -  Stable   Thrombocytopenia - stable. Present since 2014; recommend considering outpatient evaluation. No further inpatient recommendations.  ACS, unstable angina pectoris, NSTEMI - per cardiology   Severe native vessel CAD by cath this admission; s/p CABG 2000 - per cardiology   Severe aortic stenosis  - per cardiology  AKI/CKD stage III with reported baseline creatinine of 2.0. - per nephrology   Renal cysts. - radiology recommended outpt CT or MRI to further characterize.   DVT prophylaxis: heparin infusion Code Status: full Family Communication: none Disposition Plan: home    Murray Hodgkins, MD  [facility] Direct contact: [phone] --[place] [place]  --[email or web address]; password [email or web address]  7PM-7AM contact night coverage as above 09/22/2017, 3:43 PM  LOS: 8 days   Consultants:  Nephrology   [facility]  Procedures:  Echo  Impressions:  - Preserved LV systolic function s/p CABG. Grade II diastolic dysfunction, elevated LAP. Severe aortic stenosis. Trace calcific mitral stenosis. Biatrial enlargement. Mild RV dilatation with preserved systolic  function.  R/LHC Conclusion   Severe native vessel CAD Patent grafts 4/4  (LIMA-LAD, seq SVG-PDA, PLA, seq SVG-OM1, OM2, SVG-Diag1) Severe focal 90% stenosis SVG-Diag1 WHO Grp II Pulmonary hypertension Mean PA 32 mmhg  Recommendation: Heart team discussion regarding management of CAD and severe aortic stenosis Continue ASA/brilinta and heparin.    Antimicrobials:    Interval history/Subjective: Denies diarrhea. Feels ok. "My emotions are getting to me".  Objective: Vitals:  Vitals:   09/22/17 1425 09/22/17 1526  BP: (!) 116/54   Pulse:  66  Resp:  (!) 21  Temp:    SpO2:  94%    Exam:  Constitutional:   . Appears calm and comfortable sitting in chair Respiratory:  . CTA bilaterally, no w/r/r.  . Respiratory effort normal.  Cardiovascular:  . RRR, no m/r/g Abdomen:  . Abdomen appears normal; no tenderness or masses . No hernias . No HSM Psychiatric:  . Mental status o Mood, affect appropriate  I have personally reviewed the following:   Labs:  CBG stable, no lows  BMP noted, creatinine 2.12 >> 2.27 >> 2.23 >> 2.46 >> 2.61  CBC noted. Plts 149 >> 133 >> 128 >> 146 >> 143  Scheduled Meds: . aspirin  81 mg Oral Daily  . [START ON 09/23/2017] aspirin  81 mg Oral Pre-Cath  . citalopram  10 mg Oral Daily  . clopidogrel  75 mg Oral Daily  . hydrALAZINE  25 mg Oral Q8H  . insulin aspart  0-20 Units Subcutaneous TID WC  . insulin NPH Human  10 Units Subcutaneous BID AC & HS  . isosorbide mononitrate  60 mg Oral Daily  . loperamide  2 mg Oral Daily  . metoprolol tartrate  37.5 mg Oral BID  . rosuvastatin  40 mg Oral q1800  . [START ON 09/23/2017] sodium bicarbonate   Intravenous Pre-Cath  . sodium chloride flush  3 mL Intravenous Q12H  . sodium chloride flush  3 mL Intravenous Q12H  . traZODone  25 mg Oral QHS   Continuous Infusions: . sodium chloride    . sodium chloride    . sodium chloride 50 mL/hr at 09/22/17 1212  . heparin 1,700 Units/hr  (09/21/17 1038)  . [START ON 09/23/2017] sodium bicarbonate (CIN) infusion      Active Problems:   Insulin dependent type 2 diabetes mellitus, uncontrolled (HCC)   Unstable angina (HCC)   CKD (chronic kidney disease) stage 3, GFR 30-59 ml/min (HCC)   Thrombocytopenia (HCC)   Severe aortic stenosis   LOS: 8 days

## 2017-09-23 ENCOUNTER — Ambulatory Visit (HOSPITAL_COMMUNITY): Admission: RE | Admit: 2017-09-23 | Payer: Medicare Other | Source: Ambulatory Visit | Admitting: Cardiology

## 2017-09-23 ENCOUNTER — Inpatient Hospital Stay (HOSPITAL_COMMUNITY): Payer: Medicare Other

## 2017-09-23 ENCOUNTER — Other Ambulatory Visit: Payer: Self-pay | Admitting: Physician Assistant

## 2017-09-23 ENCOUNTER — Encounter (HOSPITAL_COMMUNITY): Payer: Self-pay | Admitting: Cardiology

## 2017-09-23 ENCOUNTER — Encounter (HOSPITAL_COMMUNITY)
Admission: EM | Disposition: A | Discharge status: Home / Self Care | Payer: Self-pay | Source: Home / Self Care | Attending: Cardiology

## 2017-09-23 ENCOUNTER — Ambulatory Visit (HOSPITAL_COMMUNITY): Payer: Medicare Other

## 2017-09-23 ENCOUNTER — Encounter (HOSPITAL_COMMUNITY): Payer: Medicare Other

## 2017-09-23 DIAGNOSIS — Z01810 Encounter for preprocedural cardiovascular examination: Secondary | ICD-10-CM

## 2017-09-23 HISTORY — PX: CORONARY BALLOON ANGIOPLASTY: CATH118233

## 2017-09-23 LAB — POCT ACTIVATED CLOTTING TIME
ACTIVATED CLOTTING TIME: 274 s
ACTIVATED CLOTTING TIME: 279 s
ACTIVATED CLOTTING TIME: 312 s
Activated Clotting Time: 312 seconds
Activated Clotting Time: 318 seconds
Activated Clotting Time: 467 seconds

## 2017-09-23 LAB — CBC
HEMATOCRIT: 39.6 % (ref 39.0–52.0)
HEMOGLOBIN: 13.1 g/dL (ref 13.0–17.0)
MCH: 28.9 pg (ref 26.0–34.0)
MCHC: 33.1 g/dL (ref 30.0–36.0)
MCV: 87.4 fL (ref 78.0–100.0)
Platelets: 146 10*3/uL — ABNORMAL LOW (ref 150–400)
RBC: 4.53 MIL/uL (ref 4.22–5.81)
RDW: 15.5 % (ref 11.5–15.5)
WBC: 8.9 10*3/uL (ref 4.0–10.5)

## 2017-09-23 LAB — RENAL FUNCTION PANEL
Albumin: 3 g/dL — ABNORMAL LOW (ref 3.5–5.0)
Anion gap: 6 (ref 5–15)
BUN: 40 mg/dL — ABNORMAL HIGH (ref 6–20)
CO2: 25 mmol/L (ref 22–32)
Calcium: 8.6 mg/dL — ABNORMAL LOW (ref 8.9–10.3)
Chloride: 107 mmol/L (ref 101–111)
Creatinine, Ser: 2.54 mg/dL — ABNORMAL HIGH (ref 0.61–1.24)
GFR calc Af Amer: 26 mL/min — ABNORMAL LOW
GFR calc non Af Amer: 23 mL/min — ABNORMAL LOW
Glucose, Bld: 171 mg/dL — ABNORMAL HIGH (ref 65–99)
Phosphorus: 3.4 mg/dL (ref 2.5–4.6)
Potassium: 3.7 mmol/L (ref 3.5–5.1)
Sodium: 138 mmol/L (ref 135–145)

## 2017-09-23 LAB — GLUCOSE, CAPILLARY
GLUCOSE-CAPILLARY: 143 mg/dL — AB (ref 65–99)
Glucose-Capillary: 148 mg/dL — ABNORMAL HIGH (ref 65–99)
Glucose-Capillary: 190 mg/dL — ABNORMAL HIGH (ref 65–99)
Glucose-Capillary: 169 mg/dL — ABNORMAL HIGH (ref 65–99)

## 2017-09-23 LAB — HEPARIN LEVEL (UNFRACTIONATED): HEPARIN UNFRACTIONATED: 0.57 [IU]/mL (ref 0.30–0.70)

## 2017-09-23 SURGERY — CORONARY BALLOON ANGIOPLASTY
Anesthesia: LOCAL

## 2017-09-23 MED ORDER — HYDRALAZINE HCL 20 MG/ML IJ SOLN
5.0000 mg | INTRAMUSCULAR | Status: AC | PRN
  Administered 2017-09-23: 13:00:00 5 mg via INTRAVENOUS
  Filled 2017-09-23: qty 1

## 2017-09-23 MED ORDER — TIROFIBAN (AGGRASTAT) BOLUS VIA INFUSION
INTRAVENOUS | Status: DC | PRN
  Administered 2017-09-23: 2392.5 ug via INTRAVENOUS

## 2017-09-23 MED ORDER — HEPARIN SODIUM (PORCINE) 1000 UNIT/ML IJ SOLN
INTRAMUSCULAR | Status: DC | PRN
  Administered 2017-09-23 (×4): 2000 [IU] via INTRAVENOUS
  Administered 2017-09-23: 7000 [IU] via INTRAVENOUS
  Administered 2017-09-23: 2000 [IU] via INTRAVENOUS

## 2017-09-23 MED ORDER — SODIUM CHLORIDE 0.9 % IV SOLN: INTRAVENOUS | Status: AC

## 2017-09-23 MED ORDER — LIDOCAINE HCL (PF) 1 % IJ SOLN
INTRAMUSCULAR | Status: DC | PRN
  Administered 2017-09-23: 6 mL via INTRADERMAL

## 2017-09-23 MED ORDER — FENTANYL CITRATE (PF) 100 MCG/2ML IJ SOLN
INTRAMUSCULAR | Status: DC | PRN
  Administered 2017-09-23 (×4): 25 ug via INTRAVENOUS

## 2017-09-23 MED ORDER — NITROPRUSSIDE SODIUM 25 MG/ML IV SOLN
INTRAVENOUS | Status: DC | PRN
  Administered 2017-09-23 (×2): via INTRACORONARY

## 2017-09-23 MED ORDER — TIROFIBAN HCL IN NACL 5-0.9 MG/100ML-% IV SOLN
0.0750 ug/kg/min | INTRAVENOUS | Status: AC
  Administered 2017-09-23: 14:00:00 0.075 ug/kg/min via INTRAVENOUS
  Filled 2017-09-23 (×2): qty 100

## 2017-09-23 MED ORDER — SODIUM CHLORIDE 0.9 % IV SOLN: 250.0000 mL | INTRAVENOUS | Status: DC | PRN

## 2017-09-23 MED ORDER — NITROPRUSSIDE SODIUM 25 MG/ML IV SOLN
0.0000 ug/kg/min | INTRAVENOUS | Status: DC
  Filled 2017-09-23: qty 2

## 2017-09-23 MED ORDER — RIVAROXABAN 15 MG PO TABS: 15.0000 mg | ORAL_TABLET | Freq: Every day | ORAL | Status: DC

## 2017-09-23 MED ORDER — ACETAMINOPHEN 325 MG PO TABS: 650.0000 mg | ORAL_TABLET | ORAL | Status: DC | PRN

## 2017-09-23 MED ORDER — TIROFIBAN HCL IN NACL 5-0.9 MG/100ML-% IV SOLN
INTRAVENOUS | Status: AC | PRN
  Administered 2017-09-23: 0.075 ug/kg/min via INTRAVENOUS

## 2017-09-23 MED ORDER — ONDANSETRON HCL 4 MG/2ML IJ SOLN: 4.0000 mg | Freq: Four times a day (QID) | INTRAMUSCULAR | Status: DC | PRN

## 2017-09-23 MED ORDER — MIDAZOLAM HCL 2 MG/2ML IJ SOLN
INTRAMUSCULAR | Status: DC | PRN
  Administered 2017-09-23 (×4): 1 mg via INTRAVENOUS

## 2017-09-23 MED ORDER — IOPAMIDOL (ISOVUE-370) INJECTION 76%
INTRAVENOUS | Status: DC | PRN
  Administered 2017-09-23: 70 mL via INTRA_ARTERIAL

## 2017-09-23 MED ORDER — SODIUM CHLORIDE 0.9% FLUSH: 3.0000 mL | INTRAVENOUS | Status: DC | PRN

## 2017-09-23 MED ORDER — ADENOSINE (DIAGNOSTIC) FOR INTRACORONARY USE
INTRAVENOUS | Status: DC | PRN
  Administered 2017-09-23 (×2): 6 ug via INTRACORONARY

## 2017-09-23 MED ORDER — HEPARIN (PORCINE) IN NACL 2-0.9 UNIT/ML-% IJ SOLN
INTRAMUSCULAR | Status: AC | PRN
  Administered 2017-09-23: 1000 mL

## 2017-09-23 MED ORDER — LABETALOL HCL 5 MG/ML IV SOLN: 10.0000 mg | INTRAVENOUS | Status: AC | PRN

## 2017-09-23 MED ORDER — SODIUM CHLORIDE 0.9% FLUSH
3.0000 mL | Freq: Two times a day (BID) | INTRAVENOUS | Status: DC
  Administered 2017-09-23 – 2017-09-24 (×2): 3 mL via INTRAVENOUS

## 2017-09-23 MED ORDER — LOPERAMIDE HCL 2 MG PO CAPS
2.0000 mg | ORAL_CAPSULE | ORAL | Status: DC | PRN
  Administered 2017-09-23: 22:00:00 2 mg via ORAL
  Filled 2017-09-23: qty 1

## 2017-09-23 MED ORDER — NITROGLYCERIN 1 MG/10 ML FOR IR/CATH LAB
INTRA_ARTERIAL | Status: DC | PRN
  Administered 2017-09-23 (×4): 200 ug via INTRACORONARY

## 2017-09-23 MED ORDER — ACETAMINOPHEN 325 MG PO TABS
650.0000 mg | ORAL_TABLET | ORAL | Status: DC | PRN
  Administered 2017-09-23: 650 mg via ORAL
  Filled 2017-09-23: qty 2

## 2017-09-23 SURGICAL SUPPLY — 20 items
BALLN SAPPHIRE 2.0X10 (BALLOONS) ×2
BALLOON SAPPHIRE 2.0X10 (BALLOONS) IMPLANT
CATH LAUNCHER 6FR AL.75 (CATHETERS) ×1 IMPLANT
CATH LAUNCHER 6FR AL1 (CATHETERS) IMPLANT
CATHETER LAUNCHER 6FR AL1 (CATHETERS) ×2
COVER PRB 48X5XTLSCP FOLD TPE (BAG) IMPLANT
COVER PROBE 5X48 (BAG) ×2
DEVICE SPIDERFX EMB PROT 4MM (WIRE) ×2 IMPLANT
DEVICE WIRE ANGIOSEAL 6FR (Vascular Products) ×1 IMPLANT
FILTERWIRE EZ 3.5-5.5 190CM (WIRE) IMPLANT
KIT ENCORE 26 ADVANTAGE (KITS) ×1 IMPLANT
KIT HEART LEFT (KITS) ×2 IMPLANT
PACK CARDIAC CATHETERIZATION (CUSTOM PROCEDURE TRAY) ×2 IMPLANT
SET INTRODUCER MICROPUNCT 5F (INTRODUCER) ×1 IMPLANT
SHEATH PINNACLE 6F 10CM (SHEATH) ×1 IMPLANT
TRANSDUCER W/STOPCOCK (MISCELLANEOUS) ×2 IMPLANT
TUBING CIL FLEX 10 FLL-RA (TUBING) ×2 IMPLANT
VALVE GUARDIAN II ~~LOC~~ HEMO (MISCELLANEOUS) ×1 IMPLANT
WIRE EMERALD 3MM-J .035X150CM (WIRE) ×1 IMPLANT
WIRE RUNTHROUGH .014X180CM (WIRE) ×1 IMPLANT

## 2017-09-23 NOTE — Progress Notes (Signed)
Carotid duplex prelim: difficult study as patient was upset with having test done and patient's breathing interference. Right 60-79% ICA stenosis, low end of scale. Left 40-59% ICA stenosis, low end of scale. Poor evaluation of left side as patient asked me to stop test.  Landry Mellow, RDMS, RVT

## 2017-09-23 NOTE — Progress Notes (Signed)
ANTICOAGULATION CONSULT NOTE - Initial Consult  Pharmacy Consult for Xarelto Indication: atrial fibrillation  Allergies  Allergen Reactions  . Cefuroxime Diarrhea  . Ezetimibe Diarrhea  . Pravastatin Other (See Comments)    myalgias    Patient Measurements: Height: 5\' 9"  (175.3 cm) Weight: 211 lb (95.7 kg) IBW/kg (Calculated) : 70.7  Vital Signs: Temp: 98 F (36.7 C) (11/26 0332) Temp Source: Oral (11/26 0332) BP: 176/68 (11/26 1111) Pulse Rate: 89 (11/26 1111)  Labs: Recent Labs    09/21/17 0347 09/22/17 0214 09/22/17 1544 09/23/17 0243  HGB 14.2 13.3  --  13.1  HCT 41.9 40.4  --  39.6  PLT 146* 143*  --  146*  HEPARINUNFRC 0.43 0.40  --  0.57  CREATININE 2.46* 2.61* 2.58* 2.54*    Estimated Creatinine Clearance: 27.8 mL/min (A) (by C-G formula based on SCr of 2.54 mg/dL (H)).   Medical History: Past Medical History:  Diagnosis Date  . Atrial fibrillation ([place])   . CAD (coronary artery disease)    a. 2002: s/p CABG x 4V (LIMA-LAD, seq SVG-PDA, PLA, seq SVG-OM1, OM2, SVG-Diag1)  . CKD (chronic kidney disease)   . COPD (chronic obstructive pulmonary disease) ([place])   . Diabetes mellitus without complication ([place])   . HLD (hyperlipidemia)   . Hypertension   . Obesity   . Severe aortic stenosis     Medications:  Scheduled:  . citalopram  10 mg Oral Daily  . clopidogrel  75 mg Oral Daily  . hydrALAZINE  25 mg Oral Q8H  . insulin aspart  0-20 Units Subcutaneous TID WC  . insulin NPH Human  10 Units Subcutaneous BID AC & HS  . isosorbide mononitrate  60 mg Oral Daily  . loperamide  2 mg Oral Daily  . metoprolol tartrate  37.5 mg Oral BID  . [START ON 09/24/2017] rivaroxaban  15 mg Oral Q supper  . rosuvastatin  40 mg Oral q1800  . sodium chloride flush  3 mL Intravenous Q12H  . sodium chloride flush  3 mL Intravenous Q12H  . traZODone  25 mg Oral QHS    Assessment: 15 yom s/p cath found to have 90% stenosed SVG-Diag who underwent balloon angioplasty  today. Plan to discharge patient on clopidogrel and rivaroxaban as outpatient. Currently receiving aggrastat for 18 hours.   H/H remains stable and within normal limits. Platelets remain stable at ~146. No signs/symptoms of bleeding noted. Renal function is elevated (Scr 2.54, TBW CrCl ~32 mL/min, baseline SCr ~2).   Goal of Therapy:  Monitor platelets by anticoagulation protocol: Yes   Plan:  Start rivaroxaban 15 mg once daily tomorrow at 1700 Monitor CBC, renal function, and for signs/symptoms of bleeding  Doylene Canard, PharmD Clinical Pharmacist  Pager: [phone] Clinical Phone for 09/23/2017 until 3:30pm: [phone] If after 3:30pm, please call main pharmacy at [phone]

## 2017-09-23 NOTE — Care Management Note (Addendum)
Case Management Note  Patient Details  Name: Cameron [name] MRN: [id number] Date of Birth: Feb 05, 1941  Subjective/Objective:   From home with wife, s/p angioplasty, will be on plavix and xarelto.  NCM awaiting benefit check for xarelto. NCM gave wife the xarelto 30- day free coupon and the co pay amt of 45.00 with preferred pharmacy of [facility], they have [place] in stock.  He will also be on plavix.   He states he will come back in January for TAVR.              Action/Plan: NCM will follow for dc needs.   Expected Discharge Date:                  Expected Discharge Plan:  Home/Self Care  In-House Referral:     Discharge planning Services  CM Consult  Post Acute Care Choice:    Choice offered to:     DME Arranged:    DME Agency:     HH Arranged:    [place] Agency:     Status of Service:  Completed, signed off  If discussed at [employer] of Stay Meetings, dates discussed:    Additional Comments:  Cameron [name], [place], RN 09/23/2017, 3:33 PM

## 2017-09-23 NOTE — Interval H&P Note (Signed)
History and Physical Interval Note:  09/23/2017 7:28 AM  Cameron [name]  has presented today for surgery, with the diagnosis of shortness of breath - cp  The various methods of treatment have been discussed with the patient and family. After consideration of risks, benefits and other options for treatment, the patient has consented to  Procedure(s): CORONARY STENT INTERVENTION (N/A) as a surgical intervention .  The patient's history has been reviewed, patient examined, no change in status, stable for surgery.  I have reviewed the patient's chart and labs.  Questions were answered to the patient's satisfaction.     Cath Lab Visit (complete for each Cath Lab visit)  Clinical Evaluation Leading to the Procedure:   ACS: Yes.    Non-ACS:    Anginal Classification: CCS IV  Anti-ischemic medical therapy: Maximal Therapy (2 or more classes of medications)  Non-Invasive Test Results: No non-invasive testing performed  Prior CABG: Previous CABG     Salene Mohamud J Daryan Buell

## 2017-09-23 NOTE — Progress Notes (Signed)
Chest pain resolved. Patient in good spirits, eating his supper. Denies any complaints. Continue Aggrastat for total of 18 hours. Start Xarelto tomorrow morning in conjunction with Plavix without aspirin. Repeat creatinine tomorrow morning. If renal function stable and or improving, plan to discharge on Wednesday 11/28 with follow-up with me on 11/30 with repeat labs.  Nigel Mormon, MD [facility] Pager: [phone] Office: [phone] If no answer Cell [phone]

## 2017-09-23 NOTE — Progress Notes (Signed)
PT Cancellation Note  Patient Details Name: DAEGON DEISS MRN: [id number] DOB: 02/11/1941   Cancelled Treatment:    Reason Eval/Treat Not Completed: Patient at procedure or test/unavailable. Will follow-up for PT treatment as time allows.  Mabeline Caras, PT, DPT Acute Rehab Services  Pager: [place] 09/23/2017, 7:43 AM

## 2017-09-23 NOTE — Progress Notes (Signed)
Assessment/ Plan: Pt is a 76 y.o. yo male who was admitted on 09/14/2017 with stage 3/4 CKD baseline crt around 2- but that was 3 years ago - s/p heart cath on 11/20 and PTA on 11/26 Assessment/Plan: 1. Renal- creat running from 2.2 to 2.5 this hosp this might actually be his new baseline (the 2.0 was 3 years ago).  He is making urine. S/P PTA today 2. BP/vol- stable 3. Cards-plan for  TAVR as OP.    4. Anemia- not an issue  5. Secondary hyperparathyroidism- eventually will follow with Dr. Joelyn Oms and he can check these labs and treat as needed  Subjective: Interval History: Had PCI today.  He appears to be willing to accept any potential complications related to possible CIN.  Objective: Vital signs in last 24 hours: Temp:  [98 F (36.7 C)] 98 F (36.7 C) (11/26 0332) Pulse Rate:  [63-94] 89 (11/26 1111) Resp:  [0-29] 18 (11/26 1111) BP: (123-176)/(54-97) 176/68 (11/26 1111) SpO2:  [90 %-98 %] 93 % (11/26 1111) Weight:  [95.7 kg (211 lb)] 95.7 kg (211 lb) (11/26 0332) Weight change:   Intake/Output from previous day: 11/25 0701 - 11/26 0700 In: 1607 [P.O.:100; I.V.:1079] Out: 1900 [Urine:1900] Intake/Output this shift: Total I/O In: -  Out: 700 [Urine:700]  General appearance: alert and cooperative Back: tr edema Extremities: edema 1+ bilat LE   Alert and coop  Lab Results: Recent Labs    09/22/17 0214 09/23/17 0243  WBC 9.4 8.9  HGB 13.3 13.1  HCT 40.4 39.6  PLT 143* 146*   BMET:  Recent Labs    09/22/17 0214 09/22/17 1544 09/23/17 0243  NA 136  --  138  K 3.9  --  3.7  CL 107  --  107  CO2 20*  --  25  GLUCOSE 156*  --  171*  BUN 46* 43* 40*  CREATININE 2.61* 2.58* 2.54*  CALCIUM 8.5*  --  8.6*   No results for input(s): PTH in the last 72 hours. Iron Studies: No results for input(s): IRON, TIBC, TRANSFERRIN, FERRITIN in the last 72 hours. Studies/Results: No results found.  Scheduled: . citalopram  10 mg Oral Daily  . clopidogrel  75 mg Oral  Daily  . hydrALAZINE  25 mg Oral Q8H  . insulin aspart  0-20 Units Subcutaneous TID WC  . insulin NPH Human  10 Units Subcutaneous BID AC & HS  . isosorbide mononitrate  60 mg Oral Daily  . loperamide  2 mg Oral Daily  . metoprolol tartrate  37.5 mg Oral BID  . [START ON 09/24/2017] rivaroxaban  15 mg Oral Q supper  . rosuvastatin  40 mg Oral q1800  . sodium chloride flush  3 mL Intravenous Q12H  . traZODone  25 mg Oral QHS      LOS: 9 days   Cameron [name] 09/23/2017,5:43 PM

## 2017-09-23 NOTE — Progress Notes (Signed)
  [place] VALVE TEAM   Patient had carotid US done while admitted but refused PT eval and PFTs. We will cancel these orders and reschedule as an outpatient if he decides that he would like to proceed with TAVR. He would like to get through the holidays and return to see Dr. Burt Knack in the office in January to discuss possible TAVR. An appointment was made for 1/14 @ 4 pm with Dr. Burt Knack.    Angelena Form PA-C  [facility]

## 2017-09-23 NOTE — H&P (View-Only) (Signed)
Subjective:  One episode of chest pain 11/25 am. Resolved with SL NTG No chest pain since then. Breathing stable. S/p diagnostic angiogram 11/20  In sinus rhythm this morning. Mood improved Cr decreased to 2.54. Copious urine output    Objective:  Vital Signs in the last 24 hours: Temp:  [98 F (36.7 C)] 98 F (36.7 C) (11/26 0332) Pulse Rate:  [66-85] 73 (11/26 0332) Resp:  [18-21] 18 (11/26 0332) BP: (116-129)/(54-60) 123/54 (11/26 0332) SpO2:  [94 %-95 %] 95 % (11/26 0332) Weight:  [95.7 kg (211 lb)] 95.7 kg (211 lb) (11/26 0332)   Physical Exam: HENT:  Head: Normocephalic and atraumatic.  Eyes: Conjunctivae and EOM are normal.  Neck: No JVD Cardiovascular: VariableS1. Soft S2. Irregularly irregular. III/VI crescendo murmur aortic area Vascular exam: Bilateral carotid bruit present. Femoral pulse difficult to feel due to obesity. Left popliteal normal and right faint. Absent pedal pulses bilateral Trace b/l edema Respiratory: No stridor. He has minimal rales GI: Soft.  Ventral hernia noted. BS present in all 4 quadrants Musculoskeletal: Normal range of motion. He exhibits no edema.  Lymphadenopathy: He has no cervical adenopathy.  Neurological: He is alert and oriented to person, place, and time.  Skin: Skin is warm and dry.      Lab Results: Recent Labs    09/22/17 0214 09/23/17 0243  WBC 9.4 8.9  HGB 13.3 13.1  PLT 143* 146*   Recent Labs    09/22/17 0214 09/22/17 1544 09/23/17 0243  NA 136  --  138  K 3.9  --  3.7  CL 107  --  107  CO2 20*  --  25  GLUCOSE 156*  --  171*  BUN 46* 43* 40*  CREATININE 2.61* 2.58* 2.54*    Imaging: CXR 09/14/2017 CLINICAL DATA:  Chest pain  EXAM: PORTABLE CHEST 1 VIEW  COMPARISON:  12/26/2013  FINDINGS: Cardiomegaly with interstitial coarsening diffusely. Vascular pedicle widening. Status post CABG. Remote right rib fractures. No effusion or pneumothorax.  IMPRESSION: CHF pattern,  mild.   Electronically Signed   By: Monte Fantasia M.D.   On: 09/14/2017 16:45  Cardiac Studies: Echocardiogram 09/15/2017: Study Conclusions  - Left ventricle: Wall thickness was increased in a pattern of   severe LVH. There was concentric hypertrophy. The estimated   ejection fraction was in the range of 55% to 60%. Septal wall   dyskinesis. Doppler parameters are consistent with grade II   diastolic dysfunction, elevated LAP. - Aortic valve: Severely calcified annulus. Severely calcified   leaflets. Cusp separation was reduced. There was severe stenosis.   Valve area (Vmax): 0.9 cm^2. Vmax 4.1 cm, Mean PG 41 mmHg. - Mitral valve: Moderately calcified annulus. Moderately calcified   leaflets with restricted movement of posterior mitral valve   leaflet. The findings are consistent with trivial stenosis. - Left atrium: The atrium was moderately dilated. - Right ventricle: The cavity size was mildly dilated. - Right atrium: The atrium was mildly dilated.  Impressions:  - Preserved LV systolic function s/p CABG. Grade II diastolic   dysfunction, elevated LAP. Severe aortic stenosis. Trace calcific   mitral stenosis. Biatrial enlargement. Mild RV dilatation with   preserved systolic function.  Diagnostic angiogram RHC/Coronary + Bypass graft 09/17/2017 Procedures   RIGHT HEART CATH AND CORONARY/GRAFT ANGIOGRAPHY  Conclusion   Severe native vessel CAD Patent grafts 4/4  (LIMA-LAD, seq SVG-PDA, PLA, seq SVG-OM1, OM2, SVG-Diag1) Severe focal 90% stenosis SVG-Diag1 WHO Grp II Pulmonary hypertension Mean PA 32 mmhg  Recommendation: Heart team discussion regarding management of CAD and severe aortic stenosis Continue ASA/brilinta and heparin.     Assessment:  NSTEMI: Type 1 vs type 2 in the setting of severe AS Severe AS CAD s/p CABG 2002: Severe native vessel CAD. 4/4 grafts patent. 90% focal SVG-Diag stenosis Paroxysmal Atrial fibrilation with RVR  [id number]  score 4. Annual stroke risk 4.8% Hypertension: Better controlled Pulmonary hypertension WHO Grp II Uncontrolled type 2 DM AKI/CKD: Cr decreased to 2.54. Likely combination of baseline CKD, aortic stenosis, and contrast use. Cr 2.0 in 05/2017, and 2.2 in 08/2017 per outpatient records.  1.9 L urine output last 24 hrs. Stable thrombocytopenia: Chronic. No bleeding Diarrhea: Brown, watery. Depression Hyperlipidemia B/l carotid bruit  Plan: Continue ASA//plavix/heparin Possible discharge on plavix and xarelto 15 mg daily. Continue metoprolol 37.5 mg bid, hydralazine 25 mg tid, Imdur 60 mg, crestor 40 mg. Continue celexa 10 mg during day, trazodone 25 mg at night. I presented the following options to the patient.  #1 No PCI given his CKD, continue medical management. Pros: decreased risk of recurrent AKI. Cons: Chances of recurrent chest pain and hospitalization. #2 Proceed with PCI using as little contrast as possible. Pros: Chances of improving his chest pain symptoms, in addition to medical therapy. Cons: 2-% risk of requiring dialysis, at least temporary.  I also discussed the case with Dr. Florene Glen, nephrology this morning. In his opinion, patient will likely need dialysis eventually, after PCI and/or after TAVR. It remains a high risk situation from both CAD and ESRD standpoint.   I have had extensive discussion with him regarding the risks of the procedure, including but not limited to bleeding, infection, arrhythmia, vessel closure- especially vein graft. 2-3% risk of dialysis. 1-2% risk of requiring mechanical ventilation for pulmonary edema. He is willing to undergo temporary mechanical ventilation, and/or dialysis, as necessary, but would like to proceed with the plan for PCI today,  Loperamide for diarrhea. Appreciate [name] input. Workup and TAVR likely outpatient.  Hospitalist and nephrology consult aprpeciated.   LOS: 9 days    Paarth Cropper J Magdalen Cabana 09/23/2017, 7:21  AM  [place], MD [facility]. [facility] Pager: [phone] Office: [phone] If no answer Cell [phone]

## 2017-09-23 NOTE — Progress Notes (Addendum)
Subjective:  One episode of chest pain 11/25 am. Resolved with SL NTG No chest pain since then. Breathing stable. S/p diagnostic angiogram 11/20  In sinus rhythm this morning. Mood improved Cr decreased to 2.54. Copious urine output    Objective:  Vital Signs in the last 24 hours: Temp:  [98 F (36.7 C)] 98 F (36.7 C) (11/26 0332) Pulse Rate:  [66-85] 73 (11/26 0332) Resp:  [18-21] 18 (11/26 0332) BP: (116-129)/(54-60) 123/54 (11/26 0332) SpO2:  [94 %-95 %] 95 % (11/26 0332) Weight:  [95.7 kg (211 lb)] 95.7 kg (211 lb) (11/26 0332)   Physical Exam: HENT:  Head: Normocephalic and atraumatic.  Eyes: Conjunctivae and EOM are normal.  Neck: No JVD Cardiovascular: VariableS1. Soft S2. Irregularly irregular. III/VI crescendo murmur aortic area Vascular exam: Bilateral carotid bruit present. Femoral pulse difficult to feel due to obesity. Left popliteal normal and right faint. Absent pedal pulses bilateral Trace b/l edema Respiratory: No stridor. He has minimal rales GI: Soft.  Ventral hernia noted. BS present in all 4 quadrants Musculoskeletal: Normal range of motion. He exhibits no edema.  Lymphadenopathy: He has no cervical adenopathy.  Neurological: He is alert and oriented to person, place, and time.  Skin: Skin is warm and dry.      Lab Results: Recent Labs    09/22/17 0214 09/23/17 0243  WBC 9.4 8.9  HGB 13.3 13.1  PLT 143* 146*   Recent Labs    09/22/17 0214 09/22/17 1544 09/23/17 0243  NA 136  --  138  K 3.9  --  3.7  CL 107  --  107  CO2 20*  --  25  GLUCOSE 156*  --  171*  BUN 46* 43* 40*  CREATININE 2.61* 2.58* 2.54*    Imaging: CXR 09/14/2017 CLINICAL DATA:  Chest pain  EXAM: PORTABLE CHEST 1 VIEW  COMPARISON:  12/26/2013  FINDINGS: Cardiomegaly with interstitial coarsening diffusely. Vascular pedicle widening. Status post CABG. Remote right rib fractures. No effusion or pneumothorax.  IMPRESSION: CHF pattern,  mild.   Electronically Signed   By: Monte Fantasia M.D.   On: 09/14/2017 16:45  Cardiac Studies: Echocardiogram 09/15/2017: Study Conclusions  - Left ventricle: Wall thickness was increased in a pattern of   severe LVH. There was concentric hypertrophy. The estimated   ejection fraction was in the range of 55% to 60%. Septal wall   dyskinesis. Doppler parameters are consistent with grade II   diastolic dysfunction, elevated LAP. - Aortic valve: Severely calcified annulus. Severely calcified   leaflets. Cusp separation was reduced. There was severe stenosis.   Valve area (Vmax): 0.9 cm^2. Vmax 4.1 cm, Mean PG 41 mmHg. - Mitral valve: Moderately calcified annulus. Moderately calcified   leaflets with restricted movement of posterior mitral valve   leaflet. The findings are consistent with trivial stenosis. - Left atrium: The atrium was moderately dilated. - Right ventricle: The cavity size was mildly dilated. - Right atrium: The atrium was mildly dilated.  Impressions:  - Preserved LV systolic function s/p CABG. Grade II diastolic   dysfunction, elevated LAP. Severe aortic stenosis. Trace calcific   mitral stenosis. Biatrial enlargement. Mild RV dilatation with   preserved systolic function.  Diagnostic angiogram RHC/Coronary + Bypass graft 09/17/2017 Procedures   RIGHT HEART CATH AND CORONARY/GRAFT ANGIOGRAPHY  Conclusion   Severe native vessel CAD Patent grafts 4/4  (LIMA-LAD, seq SVG-PDA, PLA, seq SVG-OM1, OM2, SVG-Diag1) Severe focal 90% stenosis SVG-Diag1 WHO Grp II Pulmonary hypertension Mean PA 32 mmhg  Recommendation: Heart team discussion regarding management of CAD and severe aortic stenosis Continue ASA/brilinta and heparin.     Assessment:  NSTEMI: Type 1 vs type 2 in the setting of severe AS Severe AS CAD s/p CABG 2002: Severe native vessel CAD. 4/4 grafts patent. 90% focal SVG-Diag stenosis Paroxysmal Atrial fibrilation with RVR  CHA2DS2VAsc  score 4. Annual stroke risk 4.8% Hypertension: Better controlled Pulmonary hypertension WHO Grp II Uncontrolled type 2 DM AKI/CKD: Cr decreased to 2.54. Likely combination of baseline CKD, aortic stenosis, and contrast use. Cr 2.0 in 05/2017, and 2.2 in 08/2017 per outpatient records.  1.9 L urine output last 24 hrs. Stable thrombocytopenia: Chronic. No bleeding Diarrhea: Brown, watery. Depression Hyperlipidemia B/l carotid bruit  Plan: Continue ASA//plavix/heparin Possible discharge on plavix and xarelto 15 mg daily. Continue metoprolol 37.5 mg bid, hydralazine 25 mg tid, Imdur 60 mg, crestor 40 mg. Continue celexa 10 mg during day, trazodone 25 mg at night.  I presented the following options to the patient.  #1 No PCI given his CKD, continue medical management. Pros: decreased risk of recurrent AKI. Cons: Chances of recurrent chest pain and hospitalization. #2 Proceed with PCI using as little contrast as possible. Pros: Chances of improving his chest pain symptoms, in addition to medical therapy. Cons: 2-5% risk of requiring dialysis, at least temporary.  I also discussed the case with Dr. Florene Glen, nephrology this morning. In his opinion, patient will likely need dialysis eventually, after PCI and/or after TAVR. It remains a high risk situation from both CAD and ESRD standpoint.   I have had extensive discussion with him regarding the risks of the procedure, including but not limited to bleeding, infection, arrhythmia, vessel closure- especially vein graft, stroke. 2-3% risk of dialysis. 1-2% risk of requiring mechanical ventilation for pulmonary edema. He is willing to undergo temporary mechanical ventilation, and/or dialysis, as necessary, but would like to proceed with the plan for PCI today.  Patient's wife and daughter present at bedside during this discussion.  Loperamide for diarrhea. Appreciate [name] input. Workup and TAVR likely outpatient.  Hospitalist and nephrology  consult aprpeciated.   LOS: 9 days    Laryn Venning J Ellinor Test 09/23/2017, 7:21 AM  [place], MD [facility] [place] Pager: [phone] Office: [phone] If no answer Cell [phone]

## 2017-09-24 LAB — RENAL FUNCTION PANEL
ALBUMIN: 3.1 g/dL — AB (ref 3.5–5.0)
Anion gap: 7 (ref 5–15)
BUN: 32 mg/dL — AB (ref 6–20)
CALCIUM: 8.8 mg/dL — AB (ref 8.9–10.3)
CHLORIDE: 107 mmol/L (ref 101–111)
CO2: 25 mmol/L (ref 22–32)
CREATININE: 2.25 mg/dL — AB (ref 0.61–1.24)
GFR calc Af Amer: 31 mL/min — ABNORMAL LOW (ref >60)
GFR, EST NON AFRICAN AMERICAN: 26 mL/min — AB (ref >60)
Glucose, Bld: 163 mg/dL — ABNORMAL HIGH (ref 65–99)
Phosphorus: 3.4 mg/dL (ref 2.5–4.6)
Potassium: 4 mmol/L (ref 3.5–5.1)
SODIUM: 139 mmol/L (ref 135–145)

## 2017-09-24 LAB — CBC
HEMATOCRIT: 41.6 % (ref 39.0–52.0)
HEMOGLOBIN: 14 g/dL (ref 13.0–17.0)
MCH: 29 pg (ref 26.0–34.0)
MCHC: 33.7 g/dL (ref 30.0–36.0)
MCV: 86.1 fL (ref 78.0–100.0)
Platelets: 157 10*3/uL (ref 150–400)
RBC: 4.83 MIL/uL (ref 4.22–5.81)
RDW: 15 % (ref 11.5–15.5)
WBC: 9.6 10*3/uL (ref 4.0–10.5)

## 2017-09-24 LAB — GLUCOSE, CAPILLARY
GLUCOSE-CAPILLARY: 144 mg/dL — AB (ref 65–99)
GLUCOSE-CAPILLARY: 147 mg/dL — AB (ref 65–99)
Glucose-Capillary: 150 mg/dL — ABNORMAL HIGH (ref 65–99)
Glucose-Capillary: 143 mg/dL — ABNORMAL HIGH (ref 65–99)

## 2017-09-24 LAB — TROPONIN I: Troponin I: 6.7 ng/mL (ref <0.03)

## 2017-09-24 MED ORDER — RIVAROXABAN 15 MG PO TABS
15.0000 mg | ORAL_TABLET | Freq: Every day | ORAL | Status: DC
  Administered 2017-09-24: 10:00:00 15 mg via ORAL
  Filled 2017-09-24: qty 1

## 2017-09-24 MED ORDER — ANGIOPLASTY BOOK
Freq: Once | Status: AC
  Administered 2017-09-24: 07:00:00
  Filled 2017-09-24: qty 1

## 2017-09-24 MED ORDER — METOPROLOL TARTRATE 25 MG PO TABS
50.0000 mg | ORAL_TABLET | Freq: Two times a day (BID) | ORAL | Status: DC
  Administered 2017-09-24 – 2017-09-25 (×2): 50 mg via ORAL
  Filled 2017-09-24 (×2): qty 2

## 2017-09-24 MED ORDER — ASPIRIN EC 81 MG PO TBEC
81.0000 mg | DELAYED_RELEASE_TABLET | Freq: Every day | ORAL | Status: DC
  Administered 2017-09-24 – 2017-09-25 (×2): 81 mg via ORAL
  Filled 2017-09-24 (×2): qty 1

## 2017-09-24 NOTE — Progress Notes (Signed)
Subjective:  Walked with cardiac rehab today. No chest pain Breathing stable. S/p diagnostic angiogram 11/20  Paroxysmal Afib w/aberrancy. Mood improved Cr decreased to 2.25. Copious urine output Trop elevated 6.7 ng/mL    Objective:  Vital Signs in the last 24 hours: Temp:  [97.6 F (36.4 C)-98.4 F (36.9 C)] 98.3 F (36.8 C) (11/27 1500) Pulse Rate:  [78-83] 80 (11/27 1500) Resp:  [20-22] 20 (11/27 0325) BP: (139-164)/(54-69) 150/65 (11/27 1500) SpO2:  [94 %-97 %] 97 % (11/27 1500) Weight:  [97.3 kg (214 lb 8.1 oz)] 97.3 kg (214 lb 8.1 oz) (11/27 0340)   Physical Exam: HENT:  Head: Normocephalic and atraumatic.  Eyes: Conjunctivae and EOM are normal.  Neck: No JVD Cardiovascular: VariableS1. Soft S2. Irregularly irregular. III/VI crescendo murmur aortic area Vascular exam: Bilateral carotid bruit present. Femoral pulse difficult to feel due to obesity. Left popliteal normal and right faint. Absent pedal pulses bilateral Trace b/l edema Respiratory: No stridor. He has minimal rales GI: Soft.  Ventral hernia noted. BS present in all 4 quadrants Musculoskeletal: Normal range of motion. He exhibits no edema.  Lymphadenopathy: He has no cervical adenopathy.  Neurological: He is alert and oriented to person, place, and time.  Skin: Skin is warm and dry.      Lab Results: Recent Labs    09/23/17 0243 09/24/17 0323  WBC 8.9 9.6  HGB 13.1 14.0  PLT 146* 157   Recent Labs    09/23/17 0243 09/24/17 0323  NA 138 139  K 3.7 4.0  CL 107 107  CO2 25 25  GLUCOSE 171* 163*  BUN 40* 32*  CREATININE 2.54* 2.25*    Imaging: CXR 09/14/2017 CLINICAL DATA:  Chest pain  EXAM: PORTABLE CHEST 1 VIEW  COMPARISON:  12/26/2013  FINDINGS: Cardiomegaly with interstitial coarsening diffusely. Vascular pedicle widening. Status post CABG. Remote right rib fractures. No effusion or pneumothorax.  IMPRESSION: CHF pattern, mild.   Electronically Signed  By: Monte Fantasia M.D.   On: 09/14/2017 16:45  Carotid ultrasound 09/23/2017: Final Interpretation: Right Carotid: There is evidence in the right ICA of a 60-79% stenosis. Low end        of scale.  Left Carotid: There is evidence in the left ICA of a 40-59% stenosis. Low end of       scale.  Vertebrals: Both vertebral arteries were patent with antegrade flow.  Cardiac Studies: Echocardiogram 09/15/2017: Study Conclusions  - Left ventricle: Wall thickness was increased in a pattern of   severe LVH. There was concentric hypertrophy. The estimated   ejection fraction was in the range of 55% to 60%. Septal wall   dyskinesis. Doppler parameters are consistent with grade II   diastolic dysfunction, elevated LAP. - Aortic valve: Severely calcified annulus. Severely calcified   leaflets. Cusp separation was reduced. There was severe stenosis.   Valve area (Vmax): 0.9 cm^2. Vmax 4.1 cm, Mean PG 41 mmHg. - Mitral valve: Moderately calcified annulus. Moderately calcified   leaflets with restricted movement of posterior mitral valve   leaflet. The findings are consistent with trivial stenosis. - Left atrium: The atrium was moderately dilated. - Right ventricle: The cavity size was mildly dilated. - Right atrium: The atrium was mildly dilated.  Impressions:  - Preserved LV systolic function s/p CABG. Grade II diastolic   dysfunction, elevated LAP. Severe aortic stenosis. Trace calcific   mitral stenosis. Biatrial enlargement. Mild RV dilatation with   preserved systolic function.  Diagnostic angiogram RHC/Coronary + Bypass graft 09/17/2017  Procedures   RIGHT HEART CATH AND CORONARY/GRAFT ANGIOGRAPHY  Conclusion   Severe native vessel CAD Patent grafts 4/4  (LIMA-LAD, seq SVG-PDA, PLA, seq SVG-OM1, OM2, SVG-Diag1) Severe focal 90% stenosis SVG-Diag1 WHO Grp II Pulmonary hypertension Mean PA 32 mmhg  Recommendation: Heart team discussion regarding management  of CAD and severe aortic stenosis Continue ASA/brilinta and heparin.    09/23/2017: Procedures   CORONARY BALLOON ANGIOPLASTY  Conclusion     Mid Graft lesion is 90% stenosed.  Balloon angioplasty was performed.  Post intervention, there is a 0% residual stenosis.   Severe native vessel CAD Patent grafts 4/4 as noticed on cath 11/20 (LIMA-LAD, seq SVG-PDA, PLA, seq SVG-OM1, OM2, SVG-Diag1) Severe focal 90% stenosis SVG-Diag1  Successful dottering of SVG 90% stenosis. Focal angioplasty in native diagonal vessel. Distal embolization and no-reflow in native diagonal vessel Partial restablishement of flow in native diagonal vessel/  Recommendation: Plavix 75 mg at least for 1 year.  Continue aggrastat bolus and infusion for 18 hrs. Continue ASA Discharge home on plavix and xarelto     Assessment:  NSTEMI: Type 1 vs type 2 in the setting of severe AS Severe AS CAD s/p CABG 2002: Severe native vessel CAD. 4/4 grafts patent. 90% focal SVG-Diag stenosis Successful dottering of SVG 90% stenosis. Focal angioplasty in native diagonal vessel. Distal embolization and no-reflow in native diagonal vessel Partial restablishement of flow in native diagonal vessel. 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   I have discussed the outcome of the procedure at length with patient's wife and daughters. All questions were answered.  Paroxysmal Atrial fibrilation with RVR  CHA2DS2VAsc score 4. Annual stroke risk 4.8% Hypertension: Better controlled Pulmonary hypertension WHO Grp II Uncontrolled type 2 DM AKI/CKD: Cr decreased to 2.15. Likely combination of baseline CKD, aortic stenosis, and contrast use. Cr 2.0 in 05/2017, and 2.2 in 08/2017 per outpatient records.  1.9 L urine output last 24 hrs. Stable thrombocytopenia: Chronic. No bleeding Diarrhea: Brown, watery. Depression Hyperlipidemia Moderate b/l carotid stenosis.  Plan: Recommend triple therapy wih  ASA/plavix and xarelto for 1-2 weeks. Then switch to plavix and xarelto alone.  Increase metoprolol 50 mg bid, hydralazine 25 mg tid, Imdur 60 mg, crestor 40 mg. Continue celexa 10 mg during day, trazodone 25 mg at night. Loperamide for diarrhea. Appreciate [name] input. Outpatient follow up with Dr. Burt Knack.Marland Kitchen  Hospitalist and nephrology consult aprpeciated.   LOS: 10 days    Cameron [name] J Claudy Abdallah 09/24/2017, 7:44 PM  [place], MD [facility] [place] Pager: [phone] Office: [phone] If no answer Cell [phone]

## 2017-09-24 NOTE — Progress Notes (Signed)
#   10. S/W MICHAEL @ [facility] # [phone]   1. XARELTO 15 MG BID   COVER- YES  CO-PAY- $ 45.00  TIER- 3 DRUG  PRIOR APPROVAL- NO   2. XARELTO 20 MG DAILY   COVER- YES  CO-PAY- $ 45.00  TIER- 3 DRUG  PRIOR APPROVAL- NO   DEDUCTIBLE : NO   PREFERRED PHARMACY : [facility] AND [facility]

## 2017-09-24 NOTE — Progress Notes (Signed)
CARDIAC REHAB PHASE I   PRE:  Rate/Rhythm: 82 SR    BP: sitting 153/58    SaO2:   MODE:  Ambulation: 450 ft   POST:  Rate/Rhythm: 112 ST  with PACs    BP: sitting 186/78     SaO2: 95 RA  Tolerated well, denied SOB. BP elevated. Ed completed. Understands importance of Plavix/ASA. Will refer to [facility] in [place] but right now pt not too interested. ? Needs to wait after TAVR. Encouraged more walking today. [place], ACSM 09/24/2017 9:29 AM

## 2017-09-24 NOTE — Discharge Instructions (Addendum)
Information on my medicine - XARELTO (Rivaroxaban)  This medication education was reviewed with me or my healthcare representative as part of my discharge preparation.  The pharmacist that spoke with me during my hospital stay was:  Betsey Holiday, [facility]  Why was Xarelto prescribed for you? Xarelto was prescribed for you to reduce the risk of a blood clot forming that can cause a stroke if you have a medical condition called atrial fibrillation (a type of irregular heartbeat).  What do you need to know about xarelto ? Take your Xarelto ONCE DAILY at the same time every day with your evening meal. If you have difficulty swallowing the tablet whole, you may crush it and mix in applesauce just prior to taking your dose.  Take Xarelto exactly as prescribed by your doctor and DO NOT stop taking Xarelto without talking to the doctor who prescribed the medication.  Stopping without other stroke prevention medication to take the place of Xarelto may increase your risk of developing a clot that causes a stroke.  Refill your prescription before you run out.  After discharge, you should have regular check-up appointments with your healthcare provider that is prescribing your Xarelto.  In the future your dose may need to be changed if your kidney function or weight changes by a significant amount.  What do you do if you miss a dose? If you are taking Xarelto ONCE DAILY and you miss a dose, take it as soon as you remember on the same day then continue your regularly scheduled once daily regimen the next day. Do not take two doses of Xarelto at the same time or on the same day.   Important Safety Information A possible side effect of Xarelto is bleeding. You should call your healthcare provider right away if you experience any of the following: ? Bleeding from an injury or your nose that does not stop. ? Unusual colored urine (red or dark brown) or unusual colored stools (red or  black). ? Unusual bruising for unknown reasons. ? A serious fall or if you hit your head (even if there is no bleeding).  Some medicines may interact with Xarelto and might increase your risk of bleeding while on Xarelto. To help avoid this, consult your healthcare provider or pharmacist prior to using any new prescription or non-prescription medications, including herbals, vitamins, non-steroidal anti-inflammatory drugs (NSAIDs) and supplements.  This website has more information on Xarelto: [email or web address]

## 2017-09-24 NOTE — Care Management Important Message (Signed)
Important Message  Patient Details  Name: HORATIO BERTZ MRN: [id number] Date of Birth: 1940-12-12   Medicare Important Message Given:  Yes    Zenon Mayo, RN 09/24/2017, 11:32 AM

## 2017-09-24 NOTE — Progress Notes (Signed)
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 BP/vol-stable 3. Cards-plan for  TAVR as OP.  4. Anemia- not an issue  5. Secondary hyperparathyroidism- eventually will follow with Dr. Joelyn Oms and he can check these labs and treat as needed  Subjective: Interval History: Feels ok, no CP today  Objective: Vital signs in last 24 hours: Temp:  [97.6 F (36.4 C)-98.4 F (36.9 C)] 97.6 F (36.4 C) (11/27 0830) Pulse Rate:  [78-90] 83 (11/27 0830) Resp:  [9-29] 20 (11/27 0325) BP: (139-176)/(54-97) 153/58 (11/27 0830) SpO2:  [90 %-96 %] 96 % (11/27 0830) Weight:  [97.3 kg (214 lb 8.1 oz)] 97.3 kg (214 lb 8.1 oz) (11/27 0340) Weight change: 1.591 kg (3 lb 8.1 oz)  Intake/Output from previous day: 11/26 0701 - 11/27 0700 In: 329 [P.O.:150; I.V.:179] Out: 1125 [Urine:1125] Intake/Output this shift: No intake/output data recorded.  General appearance: alert and cooperative Chest wall: no tenderness GI: soft, non-tender; bowel sounds normal; no masses,  no organomegaly Extremities: edema tr  Lab Results: Recent Labs    09/23/17 0243 09/24/17 0323  WBC 8.9 9.6  HGB 13.1 14.0  HCT 39.6 41.6  PLT 146* 157   BMET:  Recent Labs    09/23/17 0243 09/24/17 0323  NA 138 139  K 3.7 4.0  CL 107 107  CO2 25 25  GLUCOSE 171* 163*  BUN 40* 32*  CREATININE 2.54* 2.25*  CALCIUM 8.6* 8.8*   No results for input(s): PTH in the last 72 hours. Iron Studies: No results for input(s): IRON, TIBC, TRANSFERRIN, FERRITIN in the last 72 hours. Studies/Results: No results found.  Scheduled: . citalopram  10 mg Oral Daily  . clopidogrel  75 mg Oral Daily  . hydrALAZINE  25 mg Oral Q8H  . insulin aspart  0-20 Units Subcutaneous TID WC  . insulin NPH Human  10 Units Subcutaneous BID AC & HS  . isosorbide  mononitrate  60 mg Oral Daily  . loperamide  2 mg Oral Daily  . metoprolol tartrate  37.5 mg Oral BID  . rivaroxaban  15 mg Oral Q supper  . rosuvastatin  40 mg Oral q1800  . sodium chloride flush  3 mL Intravenous Q12H  . traZODone  25 mg Oral QHS     LOS: 10 days   Estanislado Emms 09/24/2017,9:28 AM

## 2017-09-25 LAB — RENAL FUNCTION PANEL
ALBUMIN: 3 g/dL — AB (ref 3.5–5.0)
Anion gap: 7 (ref 5–15)
BUN: 29 mg/dL — ABNORMAL HIGH (ref 6–20)
CO2: 26 mmol/L (ref 22–32)
Calcium: 9 mg/dL (ref 8.9–10.3)
Chloride: 106 mmol/L (ref 101–111)
Creatinine, Ser: 2.25 mg/dL — ABNORMAL HIGH (ref 0.61–1.24)
GFR calc Af Amer: 31 mL/min — ABNORMAL LOW (ref >60)
GFR, EST NON AFRICAN AMERICAN: 26 mL/min — AB (ref >60)
GLUCOSE: 130 mg/dL — AB (ref 65–99)
PHOSPHORUS: 2.9 mg/dL (ref 2.5–4.6)
POTASSIUM: 3.9 mmol/L (ref 3.5–5.1)
Sodium: 139 mmol/L (ref 135–145)

## 2017-09-25 LAB — CBC
HEMATOCRIT: 41.5 % (ref 39.0–52.0)
Hemoglobin: 13.8 g/dL (ref 13.0–17.0)
MCH: 28.9 pg (ref 26.0–34.0)
MCHC: 33.3 g/dL (ref 30.0–36.0)
MCV: 86.8 fL (ref 78.0–100.0)
PLATELETS: 146 10*3/uL — AB (ref 150–400)
RBC: 4.78 MIL/uL (ref 4.22–5.81)
RDW: 15.2 % (ref 11.5–15.5)
WBC: 9.6 10*3/uL (ref 4.0–10.5)

## 2017-09-25 LAB — GLUCOSE, CAPILLARY: Glucose-Capillary: 117 mg/dL — ABNORMAL HIGH (ref 65–99)

## 2017-09-25 MED ORDER — ISOSORBIDE MONONITRATE ER 60 MG PO TB24: 60.0000 mg | ORAL_TABLET | Freq: Every day | ORAL | 3 refills | Status: DC

## 2017-09-25 MED ORDER — AMLODIPINE BESYLATE 5 MG PO TABS
5.0000 mg | ORAL_TABLET | Freq: Every day | ORAL | Status: DC
  Administered 2017-09-25: 5 mg via ORAL
  Filled 2017-09-25: qty 1

## 2017-09-25 MED ORDER — CLOPIDOGREL BISULFATE 75 MG PO TABS: 75.0000 mg | ORAL_TABLET | Freq: Every day | ORAL | 3 refills | Status: DC

## 2017-09-25 MED ORDER — HYDRALAZINE HCL 25 MG PO TABS: 25.0000 mg | ORAL_TABLET | Freq: Three times a day (TID) | ORAL | 3 refills | Status: DC

## 2017-09-25 MED ORDER — AMLODIPINE BESYLATE 5 MG PO TABS: 5.0000 mg | ORAL_TABLET | Freq: Every day | ORAL | 0 refills | Status: DC

## 2017-09-25 MED ORDER — LOPERAMIDE HCL 2 MG PO CAPS: 2.0000 mg | ORAL_CAPSULE | ORAL | 0 refills | Status: DC | PRN

## 2017-09-25 MED ORDER — RIVAROXABAN 15 MG PO TABS: 15.0000 mg | ORAL_TABLET | Freq: Every day | ORAL | 3 refills | Status: DC

## 2017-09-25 MED ORDER — METOPROLOL SUCCINATE ER 100 MG PO TB24
100.0000 mg | ORAL_TABLET | Freq: Every day | ORAL | 3 refills | Status: DC
Start: 2017-09-25 — End: 2018-02-16

## 2017-09-25 MED ORDER — CITALOPRAM HYDROBROMIDE 10 MG PO TABS: 10.0000 mg | ORAL_TABLET | Freq: Every day | ORAL | 3 refills | Status: DC

## 2017-09-25 MED ORDER — ASPIRIN 81 MG PO TBEC: 81.0000 mg | DELAYED_RELEASE_TABLET | Freq: Every day | ORAL | 0 refills | Status: DC

## 2017-09-25 MED ORDER — ACETAMINOPHEN 325 MG PO TABS: 650.0000 mg | ORAL_TABLET | ORAL | 3 refills | Status: DC | PRN

## 2017-09-25 NOTE — Discharge Summary (Addendum)
Physician Discharge Summary  Patient ID: Cameron [name] MRN: [id number] DOB/AGE: 1941-02-21 76 y.o.  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 He udnerwent diagnostic cath on 11/20 that showed Severe native vessel CAD Patent grafts 4/4 (LIMA-LAD, seq SVG-PDA, PLA, seq SVG-OM1, OM2, SVG-Diag1) Severe focal 90% stenosis SVG-Diag1, WHO Grp II Pulmonary hypertension Mean PA 32 mmhg.   Given patient's high STS risk score and his preference not to undergo repeat surgery, we consulted Dr. Burt Knack for consideration of TAVR.  He recommended outpatient follow-up and potential TAVR sometime in January and agreed with proceeding with the vein graft to diagonal intervention. Given his advanced CKD and acute kidney injury secondary to contrast use, aortic stenosis, as well as atrial fibrillation, we consulted nephrology.  We took a cautious approach to allow his kidneys to recover before proceeding with intervention on 09/23/2017.  In the meantime, patient also developed proximal atrial fibrillation for which he was started on heparin and then switched to Xarelto 15 mg adjusted for his renal function.  We performed repeat cath on 09/23/2017, it was noticed that  this was a very soft nearly occlusive block that embolized to patient's native diagonal artery.  There was no residual critical stenosis in the vein graft, thus no stent was placed.  Some flow was restored in the native diagonal with doctoring technique and pre dilatatation.  Patient had mild chest pain until further evaluation with this.  This was resolved next day.  He worked with cardiac rehabilitation nurses and did not have any recurrent chest pain.  He had episodes of paroxysmal atrial fibrillation with underlying bundle branch block but no true nonsustained VT.  On the day of discharge, he is doing very well and does not have any angina symptoms.  His renal function has remarkably improved to creatinine of 2.25 and is making adequate urine.  I made the following changes to his medications.  I will increase his Toprol succinate from 50 mg to 100 mg daily.  I have continued isosorbide mononitrate 60 mg daily.  I have added amlodipine 5 mg for better blood pressure control.  In absence of any bleeding and normal hemoglobin, I recommend aspirin, Plavix, and Xarelto for 2-4 weeks.  After that, I recommend stopping aspirin and continuing Plavix on Xarelto likely indefinitely. OM 1 but be best treated medically at this tim  In future, if he has recurrent chest pain or MI, would consider intervention to this lesion.  I chose not to intervene on this lesion at this time, given complexity of his advanced CKD. He probably has peripheral artery disease.  This will be addressed in the future.  He will also follow up with nephrology at [facility] for his advanced CKD.  On a separate old, renal ultrasound showed bilateral complex cysts.  While this will need further imaging, I will refer him to urology  for outpatient evaluation.    I will see him for transition of care follow-up on December 3.  I will check basic metabolic panel on the morning of this clinic visit.  I have discussed the recommendations in  detail with the patient and his wife.  All questions were answered.   Consults: Hospitalist service, [name], Nephrology  Significant Diagnostic Studies:  Echocardiogram 09/17/2017: Study Conclusions  - Left ventricle: Wall thickness was increased in a pattern of   severe LVH. There was concentric hypertrophy. The estimated   ejection fraction was in the range of 55% to 60%. Septal wall   dyskinesis. Doppler parameters are consistent with grade II   diastolic dysfunction, elevated LAP. - Aortic valve: Severely calcified annulus. Severely calcified   leaflets. Cusp separation was reduced. There was severe stenosis.   Valve area (Vmax): 0.9 cm^2. Vmax 4.1 cm, Mean PG 41 mmHg. - Mitral valve: Moderately calcified annulus. Moderately calcified   leaflets with restricted movement of posterior mitral valve   leaflet. The findings are consistent with trivial stenosis. - Left atrium: The atrium was moderately dilated. - Right ventricle: The cavity size was mildly dilated. - Right atrium: The atrium was mildly dilated.  Impressions:  - Preserved LV systolic function s/p CABG. Grade II diastolic   dysfunction, elevated LAP. Severe aortic stenosis. Trace calcific   mitral stenosis. Biatrial enlargement. Mild RV dilatation with   preserved systolic function.  Carotid ultrasound 09/23/2017: Final Interpretation: Right Carotid: There is evidence in the right ICA of a 60-79% stenosis. Low end        of scale.  Left Carotid: There is evidence in the left ICA of a 40-59% stenosis. Low end of       scale.      Treatments: 09/17/2017: Procedures   RIGHT HEART CATH AND CORONARY/GRAFT ANGIOGRAPHY  Conclusion     Mid Graft lesion is 90% stenosed.   Severe native vessel CAD Patent grafts 4/4  (LIMA-LAD, seq SVG-PDA, PLA, seq SVG-OM1, OM2, SVG-Diag1) Severe focal 90% stenosis SVG-Diag1 WHO Grp II Pulmonary hypertension Mean PA 32 mmhg      09/23/2017: Procedures   CORONARY BALLOON ANGIOPLASTY  Conclusion     Mid Graft lesion is 90% stenosed.  Balloon angioplasty was performed.  Post intervention, there is a 0% residual stenosis.   Severe native vessel CAD Patent grafts 4/4 as noticed on cath 11/20 (LIMA-LAD, seq SVG-PDA, PLA, seq SVG-OM1, OM2, SVG-Diag1) Severe focal 90% stenosis SVG-Diag1  Successful dottering of SVG 90% stenosis. Focal angioplasty in native diagonal vessel. Distal embolization and no-reflow in native diagonal vessel Partial restablishement of flow in native diagonal vessel/     Discharge Exam: Blood pressure (!) 155/64, pulse 81, temperature 98.2 F (36.8 C), temperature source Oral, resp. rate 18, height 5\' 9"  (1.753 m), weight 96.3 kg (212 lb 4.9 oz), SpO2 93 %. HENT:  Head: Normocephalic and atraumatic.  Eyes: Conjunctivae and EOM are normal.  Neck:No JVD Cardiovascular: VariableS1. Soft S2. Irregularly irregular. III/VI crescendo murmur aortic area Vascular exam:Bilateral carotid bruit present. Femoral pulse difficult to feel due to obesity. Left popliteal normal and right faint. Absent pedal pulses bilateral Trace b/l edema Respiratory: No stridor. He hasno rales GI: Soft.  Ventral hernia noted. BS present in all 4 quadrants Musculoskeletal: Normal range of motion. He exhibits no edema.  Lymphadenopathy: He has no cervical adenopathy.  Neurological: He is alert and oriented to person, place, and time.  Skin: Skin is warm and dry.  Disposition: 01-Home or Self Care  Discharge Instructions    Amb Referral to Cardiac Rehabilitation   Complete by:  As directed    Diagnosis:  PTCA     Allergies as of 09/25/2017      Reactions   Cefuroxime Diarrhea   Ezetimibe Diarrhea   Pravastatin Other (See Comments)   myalgias      Medication List    STOP taking these medications   aspirin 325 MG tablet Replaced by:  aspirin 81 MG EC tablet     TAKE these  medications   acetaminophen 325 MG tablet Commonly known as:  TYLENOL Take 2 tablets (650 mg total) by mouth every 4 (four) hours as needed for headache or mild pain.   amLODipine 5 MG tablet Commonly known as:  NORVASC Take 1 tablet (5 mg total) by mouth daily.   aspirin 81 MG EC tablet Take 1 tablet (81 mg total) by mouth daily. Replaces:  aspirin 325 MG tablet   citalopram 10 MG tablet Commonly known as:  CELEXA Take 1 tablet (10 mg total) by mouth daily.   clopidogrel 75 MG tablet Commonly known as:  PLAVIX Take 1 tablet (75 mg total) by mouth daily.   insulin NPH Human 100 UNIT/ML injection Commonly known as:  HUMULIN N,NOVOLIN N Inject 50 Units See admin instructions into the skin. Inject 50 unit subcutaneously twice daily - after lunch and supper   insulin regular 100 units/mL injection Commonly known as:  NOVOLIN R,HUMULIN R Inject 35 Units See admin instructions into the skin. Inject 35 units subcutaneously twice daily - after lunch and supper   isosorbide mononitrate 60 MG 24 hr tablet Commonly known as:  IMDUR Take 1 tablet (60 mg total) by mouth daily. What changed:    medication strength  how much to take  when to take this  additional instructions   loperamide 2 MG capsule Commonly known as:  IMODIUM Take 1 capsule (2 mg total) by mouth as needed for diarrhea or loose stools.   metoprolol succinate 100 MG 24 hr tablet Commonly known as:  TOPROL-XL Take 1 tablet (100 mg total) by mouth daily after supper. Take with or immediately following a meal. What changed:    medication strength  how much to take   nitroGLYCERIN 0.4 MG SL tablet Commonly known as:  NITROSTAT Place 0.4 mg every 5 (five) minutes as needed under the tongue for chest pain.   Rivaroxaban 15 MG Tabs tablet Commonly known as:  XARELTO Take 1 tablet (15 mg total) by mouth daily with supper.   rosuvastatin 40 MG tablet Commonly known as:  CRESTOR Take 40 mg daily after supper  by mouth.      Follow-up Information    Sherren Mocha, MD. Go on 11/11/2017.   Specialty:  Cardiology Why:  @ 4pm to discuss possible valve replacement  Contact information: [address] [address] [phone]        Nigel Mormon, MD Follow up on 09/30/2017.   Specialty:  Cardiology Why:  10:30 AM Contact information: [address] [place] [address] [phone]           Signed: Nigel Mormon 09/25/2017, 7:25 AM   Nigel Mormon, MD [facility] Pager: [phone] Office: [phone] If no answer Cell [phone]

## 2017-09-25 NOTE — Progress Notes (Signed)
Assessment/ Plan:  Pt is a76 y.o.yo malewho was admitted on 11/17/2018with stage 3/4 CKD baseline crt around 2- but that was 3 years ago - s/p heart cathon 11/20 and PTA on 11/26 Assessment/Plan: 1 CKD, no evidence of CIN 2. BP/vol-stable 3. Cards-plan for TAVR as OP.  4. Anemia- not an issue  5. Secondary hyperparathyroidism- eventually will follow with Dr. Joelyn Oms and he can check these labs and treat as needed  Subjective: Interval History: stable, going home  Objective: Vital signs in last 24 hours: Temp:  [97.7 F (36.5 C)-98.3 F (36.8 C)] 98 F (36.7 C) (11/28 0800) Pulse Rate:  [80-90] 90 (11/28 0800) Resp:  [18-24] 22 (11/28 0800) BP: (142-156)/(50-65) 149/50 (11/28 0800) SpO2:  [93 %-97 %] 94 % (11/28 0800) Weight:  [96.3 kg (212 lb 4.9 oz)] 96.3 kg (212 lb 4.9 oz) (11/28 0312) Weight change: -1 kg (-3.3 oz)  Intake/Output from previous day: 11/27 0701 - 11/28 0700 In: 600 [P.O.:600] Out: 250 [Urine:250] Intake/Output this shift: No intake/output data recorded.  no change in exam  Lab Results: Recent Labs    09/24/17 0323 09/25/17 0327  WBC 9.6 9.6  HGB 14.0 13.8  HCT 41.6 41.5  PLT 157 146*   BMET:  Recent Labs    09/24/17 0323 09/25/17 0327  NA 139 139  K 4.0 3.9  CL 107 106  CO2 25 26  GLUCOSE 163* 130*  BUN 32* 29*  CREATININE 2.25* 2.25*  CALCIUM 8.8* 9.0   No results for input(s): PTH in the last 72 hours. Iron Studies: No results for input(s): IRON, TIBC, TRANSFERRIN, FERRITIN in the last 72 hours. Studies/Results: No results found.  Scheduled: . amLODipine  5 mg Oral Daily  . aspirin EC  81 mg Oral Daily  . citalopram  10 mg Oral Daily  . clopidogrel  75 mg Oral Daily  . hydrALAZINE  25 mg Oral Q8H  . insulin aspart  0-20 Units Subcutaneous TID WC  . insulin NPH Human  10 Units Subcutaneous BID AC & HS  . isosorbide mononitrate  60 mg Oral Daily  . loperamide  2 mg Oral Daily  . metoprolol tartrate  50 mg Oral BID  .  rivaroxaban  15 mg Oral Q supper  . rosuvastatin  40 mg Oral q1800  . sodium chloride flush  3 mL Intravenous Q12H  . traZODone  25 mg Oral QHS     LOS: 11 days   Estanislado Emms 09/25/2017,8:40 AM

## 2017-09-25 NOTE — Progress Notes (Signed)
CARDIAC REHAB PHASE I   PRE:  Rate/Rhythm: 82 SR  BP:  Sitting: 149/50        SaO2: 93 RA  MODE:  Ambulation: 500 ft   POST:  Rate/Rhythm: 110 ST  BP:  Sitting: 159/97         SaO2: 95 RA  Pt ambulated 500 ft on RA, hand held assist, steady gait, tolerated well with no complaints. Pt states he has no questions regarding education at this time, eager for discharge. Pt to bed per pt request after walk, call bell within reach.   [phone] Lenna Sciara, RN, BSN 09/25/2017 9:02 AM

## 2017-09-26 ENCOUNTER — Telehealth (HOSPITAL_COMMUNITY): Payer: Self-pay

## 2017-09-26 NOTE — Telephone Encounter (Signed)
Patients insurance is active and benefits verified through [facility] - $20.00 co-pay, no deductible, out of pocket amount of $4,400/$57.27 has been met, no co-insurance, and no pre-authorization is required. Passport/reference [id number]  Patient will be contacted and scheduled after their follow up appt with appt with the Cardiologist office upon review by the RN Navigator.

## 2017-10-24 ENCOUNTER — Ambulatory Visit: Payer: Medicare Other | Admitting: Internal Medicine

## 2017-11-11 ENCOUNTER — Encounter (INDEPENDENT_AMBULATORY_CARE_PROVIDER_SITE_OTHER): Payer: Self-pay

## 2017-11-11 ENCOUNTER — Encounter: Payer: Self-pay | Admitting: Cardiovascular Disease

## 2017-11-11 ENCOUNTER — Ambulatory Visit: Payer: Medicare Other | Admitting: Cardiovascular Disease

## 2017-11-11 VITALS — BP 142/78 | HR 98 | Ht 69.0 in | Wt 211.8 lb

## 2017-11-11 DIAGNOSIS — I350 Nonrheumatic aortic (valve) stenosis: Secondary | ICD-10-CM

## 2017-11-11 NOTE — Patient Instructions (Signed)
Medication Instructions:  Your provider recommends that you continue on your current medications as directed. Please refer to the Current Medication list given to you today.    Labwork: None  Testing/Procedures: You have several scans that will need to be arranged. Lauren, the TAVR nurse, will call you to schedule.  Follow-Up: Ander Purpura, RN, will call you to arrange follow-up visits.  Any Other Special Instructions Will Be Listed Below (If Applicable).     If you need a refill on your cardiac medications before your next appointment, please call your pharmacy.

## 2017-11-11 NOTE — H&P (View-Only) (Signed)
Cardiology Office Note Date:  11/15/2017   ID:  Cameron [name], DOB 03-07-1941, MRN [id number]  PCP:  Parke Poisson, MD  Cardiologist:  Sherren Mocha, MD    Chief Complaint  Patient presents with  . Chest Pain     History of Present Illness: Cameron [name] is a 77 y.o. male who presents for follow-up of severe aortic stenosis. He is here with his wife and daughter today.   The patient was initially seen in November 2018 when he was hospitalized with acute heart failure and crescendo angina. He has a complex hx with remote CABG in 2002, CKD IV, and recent development of atrial fibrillation with RVR now on anticoagulation with rivaroxaban.  Since discharge from the hospital, he continues to feel very poorly, limited by exertional chest discomfort with activity. He also complains of fatigue and dyspnea. Denies rest pain or resting dyspnea, but states that he is not able to do even low-level activities. He reports no orthopnea, PND, or syncope. He does experience occasional lightheadedness. No bleeding problems on a combination of rivaroxaban, clopidogrel, and aspirin.    Past Medical History:  Diagnosis Date  . Atrial fibrillation ([employer])   . CAD (coronary artery disease)    a. 2002: s/p CABG x 4V (LIMA-LAD, seq SVG-PDA, PLA, seq SVG-OM1, OM2, SVG-Diag1)  . CKD (chronic kidney disease)   . COPD (chronic obstructive pulmonary disease) ([place])   . Diabetes mellitus without complication ([place])   . HLD (hyperlipidemia)   . Hypertension   . Obesity   . Severe aortic stenosis     Past Surgical History:  Procedure Laterality Date  . CARDIAC SURGERY    . CORONARY BALLOON ANGIOPLASTY N/A 09/23/2017   Procedure: CORONARY BALLOON ANGIOPLASTY;  Surgeon: Nigel Mormon, MD;  Location: [facility];  Service: Cardiovascular;  Laterality: N/A;  . FINGER AMPUTATION    . HERNIA REPAIR    . open heart surgery    . RIGHT HEART CATH AND CORONARY/GRAFT ANGIOGRAPHY N/A 09/17/2017   Procedure: RIGHT HEART CATH AND CORONARY/GRAFT ANGIOGRAPHY;  Surgeon: Nigel Mormon, MD;  Location: [facility];  Service: Cardiovascular;  Laterality: N/A;  . TOE AMPUTATION    . ULTRASOUND GUIDANCE FOR VASCULAR ACCESS  09/17/2017   Procedure: Ultrasound Guidance For Vascular Access;  Surgeon: Nigel Mormon, MD;  Location: [facility];  Service: Cardiovascular;;    Current Outpatient Medications  Medication Sig Dispense Refill  . acetaminophen (TYLENOL) 325 MG tablet Take 2 tablets (650 mg total) by mouth every 4 (four) hours as needed for headache or mild pain. 60 tablet 3  . amLODipine (NORVASC) 5 MG tablet Take 1 tablet (5 mg total) by mouth daily. 30 tablet 0  . aspirin EC 81 MG EC tablet Take 1 tablet (81 mg total) by mouth daily. 30 tablet 0  . citalopram (CELEXA) 10 MG tablet Take 1 tablet (10 mg total) by mouth daily. 60 tablet 3  . clopidogrel (PLAVIX) 75 MG tablet Take 1 tablet (75 mg total) by mouth daily. 60 tablet 3  . furosemide (LASIX) 40 MG tablet Take 40 mg by mouth.    . insulin NPH (HUMULIN N,NOVOLIN N) 100 UNIT/ML injection Inject 50 Units See admin instructions into the skin. Inject 50 unit subcutaneously twice daily - after lunch and supper    . insulin regular (NOVOLIN R,HUMULIN R) 100 units/mL injection Inject 35 Units See admin instructions into the skin. Inject 35 units subcutaneously twice daily -  after lunch and supper    . isosorbide mononitrate (IMDUR) 60 MG 24 hr tablet Take 60 mg by mouth daily. Patient takes 2 tablets (120 mg) in the morning. He takes 1 tablet (60 mg) in the evening.    . loperamide (IMODIUM) 2 MG capsule Take 1 capsule (2 mg total) by mouth as needed for diarrhea or loose stools. 30 capsule 0  . metoprolol succinate (TOPROL-XL) 100 MG 24 hr tablet Take 1 tablet (100 mg total) by mouth daily after supper. Take with or immediately following a meal. 60 tablet 3  . nitroGLYCERIN (NITROSTAT) 0.4 MG SL tablet Place 0.4 mg  every 5 (five) minutes as needed under the tongue for chest pain.    . Rivaroxaban (XARELTO) 15 MG TABS tablet Take 1 tablet (15 mg total) by mouth daily with supper. 60 tablet 3  . rosuvastatin (CRESTOR) 40 MG tablet Take 40 mg daily after supper by mouth.      No current facility-administered medications for this visit.     Allergies:   Cefuroxime; Ezetimibe; and Pravastatin   Social History:  The patient  reports that he quit smoking about 39 years ago. His smoking use included cigarettes. He has a 40.00 pack-year smoking history. he has never used smokeless tobacco. He reports that he does not drink alcohol or use drugs.   Family History:  The patient's  family history includes Heart disease in his father; Lung cancer in his maternal grandmother; Stomach cancer in his paternal grandfather.    ROS:  Please see the history of present illness.  Otherwise, review of systems is positive for fatigue.  All other systems are reviewed and negative.    PHYSICAL EXAM: VS:  BP (!) 142/78   Pulse 98   Ht 5\' 9"  (1.753 m)   Wt 211 lb 12.8 oz (96.1 kg)   SpO2 97%   BMI 31.28 kg/m  , BMI Body mass index is 31.28 kg/m. GEN: Elderly male, in no acute distress  HEENT: normal except for poor dentition Neck: no JVD, no masses. Bilateral  carotid bruits Cardiac: RRR with 3/6 harsh late peaking systolic murmur at the RUSB, no diastolic murmur                Respiratory:  clear to auscultation bilaterally, normal work of breathing GI: soft, nontender, nondistended, + BS MS: no deformity or atrophy  Ext: 1+ bilateral pretibial edema Skin: warm and dry, no rash Neuro:  Strength and sensation are intact Psych: euthymic mood, full affect  EKG:  EKG is not ordered today.  Recent Labs: 09/14/2017: TSH 3.159 09/15/2017: ALT 13 09/21/2017: B Natriuretic Peptide 237.3 09/25/2017: BUN 29; Creatinine, Ser 2.25; Hemoglobin 13.8; Platelets 146; Potassium 3.9; Sodium 139   Lipid Panel     Component  Value Date/Time   CHOL 135 09/15/2017 0255   TRIG 126 09/20/2017 2143   HDL 28 (L) 09/15/2017 0255   CHOLHDL 4.8 09/15/2017 0255   VLDL 34 09/15/2017 0255   LDLCALC 73 09/15/2017 0255      Wt Readings from Last 3 Encounters:  11/11/17 211 lb 12.8 oz (96.1 kg)  09/25/17 212 lb 4.9 oz (96.3 kg)  12/06/15 208 lb (94.3 kg)     Cardiac Studies Reviewed: Echo 09/15/2017: Left ventricle:   Wall thickness was increased in a pattern of severe LVH.   There was concentric hypertrophy. The estimated ejection fraction was in the range of 55% to 60%.  Regional wall motion abnormalities:  Septal wall  dyskinesis. Doppler parameters are consistent with grade II diastolic dysfunction, elevated LAP.   ------------------------------------------------------------------- Aortic valve:   Severely calcified annulus. Severely calcified leaflets. Cusp separation was reduced.  Doppler:   There was severe stenosis.      VTI ratio of LVOT to aortic valve: 0.26. Indexed valve area (VTI): 0.37 cm^2/m^2. Peak velocity ratio of LVOT to aortic valve: 0.29. Valve area (Vmax): 0.9 cm^2. Indexed valve area (Vmax): 0.4 cm^2/m^2. Mean velocity ratio of LVOT to aortic valve: 0.26. Indexed valve area (Vmean): 0.36 cm^2/m^2.    Mean gradient (S): 36 mm Hg. Peak gradient (S): 64 mm Hg.  ------------------------------------------------------------------- Mitral valve:   Moderately calcified annulus. Moderately calcified leaflets .  Doppler:   The findings are consistent with trivial stenosis.      Peak gradient (D): 5 mm Hg.  ------------------------------------------------------------------- Left atrium:  The atrium was moderately dilated.  ------------------------------------------------------------------- Right ventricle:  The cavity size was mildly dilated. Systolic function was normal.  ------------------------------------------------------------------- Pulmonic valve:    The valve appears to be grossly  normal. Doppler:  There was no significant regurgitation.  ------------------------------------------------------------------- Tricuspid valve:  Poorly visualized.  The valve appears to be grossly normal.    Doppler:  There was no significant regurgitation.  ------------------------------------------------------------------- Right atrium:  The atrium was mildly dilated.  ------------------------------------------------------------------- Pericardium:  The pericardium was normal in appearance.  ------------------------------------------------------------------- Measurements   Left ventricle                           Value          Reference  LV ID, ED, PLAX chordal          (L)     37.3  mm       43 - 52  LV ID, ES, PLAX chordal                  24.2  mm       23 - 38  LV fx shortening, PLAX chordal           35    %        >=29  LV PW thickness, ED                      18.4  mm       ----------  IVS/LV PW ratio, ED                      1.01           <=1.3  Stroke volume, 2D                        60    ml       ----------  Stroke volume/bsa, 2D                    27    ml/m^2   ----------  LV e&', lateral                           6.53  cm/s     ----------  LV E/e&', lateral                         17.61          ----------  LV e&', medial  5.87  cm/s     ----------  LV E/e&', medial                          19.59          ----------  LV e&', average                           6.2   cm/s     ----------  LV E/e&', average                         18.55          ----------    Ventricular septum                       Value          Reference  IVS thickness, ED                        18.6  mm       ----------    LVOT                                     Value          Reference  LVOT ID, S                               20    mm       ----------  LVOT area                                3.14  cm^2     ----------  LVOT peak velocity, S                     114   cm/s     ----------  LVOT mean velocity, S                    70.6  cm/s     ----------  LVOT VTI, S                              19.1  cm       ----------  LVOT peak gradient, S                    5     mm Hg    ----------    Aortic valve                             Value          Reference  Aortic valve peak velocity, S            399   cm/s     ----------  Aortic valve mean velocity, S            274   cm/s     ----------  Aortic valve VTI, S  72.2  cm       ----------  Aortic mean gradient, S                  36    mm Hg    ----------  Aortic peak gradient, S                  64    mm Hg    ----------  VTI ratio, LVOT/AV                       0.26           ----------  Aortic valve area/bsa, VTI               0.37  cm^2/m^2 ----------  Velocity ratio, peak, LVOT/AV            0.29           ----------  Aortic valve area, peak velocity         0.9   cm^2     ----------  Aortic valve area/bsa, peak              0.4   cm^2/m^2 ----------  velocity  Velocity ratio, mean, LVOT/AV            0.26           ----------  Aortic valve area/bsa, mean              0.36  cm^2/m^2 ----------  velocity    Aorta                                    Value          Reference  Aortic root ID, ED                       26    mm       ----------    Left atrium                              Value          Reference  LA ID, A-P, ES                           43    mm       ----------  LA ID/bsa, A-P                           1.92  cm/m^2   <=2.2  LA volume, S                             81.7  ml       ----------  LA volume/bsa, S                         36.5  ml/m^2   ----------  LA volume, ES, 1-p A4C                   92.1  ml       ----------  LA volume/bsa, ES, 1-p A4C  41.2  ml/m^2   ----------  LA volume, ES, 1-p A2C                   72.2  ml       ----------  LA volume/bsa, ES, 1-p A2C               32.3  ml/m^2   ----------    Mitral valve                              Value          Reference  Mitral E-wave peak velocity              115   cm/s     ----------  Mitral A-wave peak velocity              110   cm/s     ----------  Mitral deceleration time                 204   ms       150 - 230  Mitral peak gradient, D                  5     mm Hg    ----------  Mitral E/A ratio, peak                   1              ----------    Right atrium                             Value          Reference  RA ID, S-I, ES, A4C              (H)     54.3  mm       34 - 49  RA area, ES, A4C                         18.5  cm^2     8.3 - 19.5  RA volume, ES, A/L                       51.8  ml       ----------  RA volume/bsa, ES, A/L                   23.2  ml/m^2   ----------    Systemic veins                           Value          Reference  Estimated CVP                            3     mm Hg    ----------    Right ventricle                          Value          Reference  RV ID, minor axis, ED, A4C base          47.9  mm       ----------  TAPSE                                    18.3  mm       ----------  RV s&', lateral, S                        14    cm/s     ----------  Cardiac Cath 11-20-2-18: Conclusion     Mid Graft lesion is 90% stenosed.   Severe native vessel CAD Patent grafts 4/4  (LIMA-LAD, seq SVG-PDA, PLA, seq SVG-OM1, OM2, SVG-Diag1) Severe focal 90% stenosis SVG-Diag1 WHO Grp II Pulmonary hypertension Mean PA 32 mmhg  Recommendation: Heart team discussion regarding management of CAD and severe aortic stenosis Continue ASA/brilinta and heparin.  Nigel Mormon, MD [facility] Pager: [phone] Office: [phone] If no answer Cell [phone]     Indications   Severe aortic stenosis [I35.0 (ICD-10-CM)]  NSTEMI (non-ST elevated myocardial infarction) ([place]) [I21.4 (ICD-10-CM)]  Procedural Details/Technique   Technical Details Procedures: 1. Ultrasound guided left radial artery, Rt CFV, and  Rt CFA access 2. Right heart catheterization 3. Left heart catheterization 4. Selective left and right coronary angiography 5. Selective bypass graft angiography. 6. Conscious sedation monitoring 94 min  Indication: Severe aortic stenosis NSTEMI  History:  77 y/o CM w/severe aortic stenosis, CAD s/p CABG (LIMA-LAD, seq SVG-PDA, PLA, seq SVG-OM1, OM2, SVG-Diag1), hypertension, uncontrolled type 2 DM, CKD stage 3/4, admitted with NSTEMI. He is recommended to undergo RHC and coronary angiography.  Catheter/s advanced over guidewire under fluoroscopy 4 Fr balloon tipped wedge catheter 6 Fr JL 3.5 5 Fr JR 4 6 Fr AR 1 5 Fr IMA 6 Fr 3DRC 5 Fr LCB  Wires used: Inquire 0.025 glidewire   Pressures tracings obtained in right atrium, right ventricle, pulmonary artery, and pulmonary capillary wedge position.   Anticoagulation:  5,000 units heparin  Total contrast used: 125 cc   Total fluoro time: 12.6 min Air Kerma: 463 mGy  All wires and catheters removed out of the body at the end of the procedure Final angiogram showed no dissection/perforation         Estimated blood loss <50 mL.  During this procedure the patient was administered the following to achieve and maintain moderate conscious sedation: Versed 2 mg, Fentanyl 50 mcg, while the patient's heart rate, blood pressure, and oxygen saturation were continuously monitored. The period of conscious sedation was 94 minutes, of which I was present face-to-face 100% of this time.  Complications   Complications documented before study signed (09/23/2017 11:14 AM EST)    RIGHT HEART CATH AND CORONARY/GRAFT ANGIOGRAPHY   None Documented by Nigel Mormon, MD 09/17/2017 7:23 PM EST  Time Range: Intra-procedure      Coronary Findings   Diagnostic  Dominance: Right  Left Main  Ost LM to Mid LM lesion 60% stenosed  Ost LM to Mid LM lesion is 60% stenosed.  Mid LM to Dist LM lesion 60% stenosed  Mid LM to Dist  LM lesion is 60% stenosed.  Left Anterior Descending  Ost LAD lesion 100% stenosed  Ost LAD lesion is 100% stenosed.  Prox LAD to Mid LAD lesion 60% stenosed  Prox LAD to Mid LAD lesion is 60% stenosed. Small caliber  Mid LAD lesion 70% stenosed  Mid LAD lesion is 70% stenosed. Small caliber vessel  Left Circumflex  Prox Cx lesion 99% stenosed with side branch in Ost 1st Mrg 100% stenosed  Prox Cx lesion is 99% stenosed with 100% stenosed side branch in Ost 1st Mrg.  First Obtuse Marginal Branch  1st Mrg lesion 80% stenosed  1st Mrg lesion is 80% stenosed.  Second Obtuse Marginal Branch  Ost 2nd Mrg to 2nd Mrg lesion 90% stenosed  Ost 2nd Mrg to 2nd Mrg lesion is 90% stenosed.  Right Coronary Artery  Prox RCA lesion 100% stenosed  Prox RCA lesion is 100% stenosed.  Mid RCA lesion 60% stenosed  Mid RCA lesion is 60% stenosed.  Right Posterior Descending Artery  Vessel is small in size. There is moderate disease in the vessel.  Right Posterior Atrioventricular Branch  Vessel is small in size. There is moderate disease in the vessel.  LIMA Graft to Mid LAD  The graft exhibits no disease.  Sequential Graft to 1st Mrg, 2nd Mrg  Sequential Graft to RPDA, 1st RPLB  Graft to 1st Diag  The graft exhibits severe focal disease. Severe focal 90% stenosis  Mid Graft lesion 90% stenosed  Mid Graft lesion is 90% stenosed.  Intervention   No interventions have been documented.  Right Heart   Right Heart Pressures Hemodynamic findings consistent with mild pulmonary hypertension.  Right Atrium Right atrial pressure is elevated.  Coronary Diagrams   Diagnostic Diagram       Implants     No implant documentation for this case.  MERGE Images   Show images for CARDIAC CATHETERIZATION   Link to Procedure Log   Procedure Log    Hemo Data    Most Recent Value  Fick Cardiac Output 4.57 L/min  Fick Cardiac Output Index 2.14 (L/min)/BSA  RA A Wave 10 mmHg  RA V Wave 9 mmHg  RA  Mean 7 mmHg  RV Systolic Pressure 45 mmHg  RV Diastolic Pressure 7 mmHg  RV EDP 9 mmHg  PA Systolic Pressure 56 mmHg  PA Diastolic Pressure 15 mmHg  PA Mean 32 mmHg  PW A Wave 21 mmHg  PW V Wave 30 mmHg  PW Mean 21 mmHg  AO Systolic Pressure 960 mmHg  AO Diastolic Pressure 71 mmHg  AO Mean 108 mmHg  QP/QS 1  TPVR Index 14.99 HRUI  TSVR Index 50.6 HRUI  PVR SVR Ratio 0.11  TPVR/TSVR Ratio 0.3     ASSESSMENT AND PLAN: 1.  Severe, Stage D1, symptomatic degenerative aortic valve stenosis 2. CAD, native vessel and bypass graft disease, with CCS III angina 3. CKD IV 4. Type II DM 5. Paroxysmal atrial fibrillation  The patient and his family are counseled extensively today regarding further treatment options for aortic stenosis. Because of his comorbid conditions outlined above, I do not think he would be a candidate for redo sternotomy and surgical AVR. However, TAVR is a reasonable consideration. We discussed necessary evaluation at length today. He understand he will require CT angio studies with limited contrast protocol as well as formal cardiac surgical consultation (Dr Prescott Gum performed the patient's CABG). His case is difficult and he understands the potential risks of TAVR are increased with his kidney disease, heart failure, and significant ischemic burden. I have again reviewed his coronary angio films which demonstrate patency of the LIMA-LAD, SVG-OM, and SVG-RCA branches. The diagonal graft was intervened upon with distal embolization/no-reflow. The native OM beyond the graft insertion has severe stenosis. While he is most limited by angina, I think the best approach to his treatment is to  first address aortic stenosis then if he has persistent lifestyle limiting angina, further PCI of the native OM branches could be considered. The patient and his family understand the risk of progressive kidney injury with further contrast studies and we will follow renal protocols with these  studies. I have also reviewed the specific risks of TAVR, including vascular injury, heart block requiring PPM, stroke, MI, paravalvular regurgitation, valve embolization, and death. He understand these risks and wishes to proceed.   Next steps: CTA studies and cardiac surgical consultation. Continue same Rx.   Current medicines are reviewed with the patient today.  The patient does not have concerns regarding medicines.  Labs/ tests ordered today include:  No orders of the defined types were placed in this encounter.   Disposition:   FU pending further test results  Signed, Sherren Mocha, MD  11/15/2017 8:14 AM    [facility] [address], [place], [place]  [address] Phone: [phone]; Fax: [phone]

## 2017-11-11 NOTE — Progress Notes (Signed)
Cardiology Office Note Date:  11/15/2017   ID:  Cameron [name], DOB 02/02/1941, MRN [id number]  PCP:  Parke Poisson, MD  Cardiologist:  Sherren Mocha, MD    Chief Complaint  Patient presents with  . Chest Pain     History of Present Illness: Cameron [name] is a 77 y.o. male who presents for follow-up of severe aortic stenosis. He is here with his wife and daughter today.   The patient was initially seen in November 2018 when he was hospitalized with acute heart failure and crescendo angina. He has a complex hx with remote CABG in 2002, CKD IV, and recent development of atrial fibrillation with RVR now on anticoagulation with rivaroxaban.  Since discharge from the hospital, he continues to feel very poorly, limited by exertional chest discomfort with activity. He also complains of fatigue and dyspnea. Denies rest pain or resting dyspnea, but states that he is not able to do even low-level activities. He reports no orthopnea, PND, or syncope. He does experience occasional lightheadedness. No bleeding problems on a combination of rivaroxaban, clopidogrel, and aspirin.    Past Medical History:  Diagnosis Date  . Atrial fibrillation ([place])   . CAD (coronary artery disease)    a. 2002: s/p CABG x 4V (LIMA-LAD, seq SVG-PDA, PLA, seq SVG-OM1, OM2, SVG-Diag1)  . CKD (chronic kidney disease)   . COPD (chronic obstructive pulmonary disease) ([place])   . Diabetes mellitus without complication ([place])   . HLD (hyperlipidemia)   . Hypertension   . Obesity   . Severe aortic stenosis     Past Surgical History:  Procedure Laterality Date  . CARDIAC SURGERY    . CORONARY BALLOON ANGIOPLASTY N/A 09/23/2017   Procedure: CORONARY BALLOON ANGIOPLASTY;  Surgeon: Nigel Mormon, MD;  Location: [facility];  Service: Cardiovascular;  Laterality: N/A;  . FINGER AMPUTATION    . HERNIA REPAIR    . open heart surgery    . RIGHT HEART CATH AND CORONARY/GRAFT ANGIOGRAPHY N/A 09/17/2017   Procedure: RIGHT HEART CATH AND CORONARY/GRAFT ANGIOGRAPHY;  Surgeon: Nigel Mormon, MD;  Location: [name];  Service: Cardiovascular;  Laterality: N/A;  . TOE AMPUTATION    . ULTRASOUND GUIDANCE FOR VASCULAR ACCESS  09/17/2017   Procedure: Ultrasound Guidance For Vascular Access;  Surgeon: Nigel Mormon, MD;  Location: [facility];  Service: Cardiovascular;;    Current Outpatient Medications  Medication Sig Dispense Refill  . acetaminophen (TYLENOL) 325 MG tablet Take 2 tablets (650 mg total) by mouth every 4 (four) hours as needed for headache or mild pain. 60 tablet 3  . amLODipine (NORVASC) 5 MG tablet Take 1 tablet (5 mg total) by mouth daily. 30 tablet 0  . aspirin EC 81 MG EC tablet Take 1 tablet (81 mg total) by mouth daily. 30 tablet 0  . citalopram (CELEXA) 10 MG tablet Take 1 tablet (10 mg total) by mouth daily. 60 tablet 3  . clopidogrel (PLAVIX) 75 MG tablet Take 1 tablet (75 mg total) by mouth daily. 60 tablet 3  . furosemide (LASIX) 40 MG tablet Take 40 mg by mouth.    . insulin NPH (HUMULIN N,NOVOLIN N) 100 UNIT/ML injection Inject 50 Units See admin instructions into the skin. Inject 50 unit subcutaneously twice daily - after lunch and supper    . insulin regular (NOVOLIN R,HUMULIN R) 100 units/mL injection Inject 35 Units See admin instructions into the skin. Inject 35 units subcutaneously twice daily -  after lunch and supper    . isosorbide mononitrate (IMDUR) 60 MG 24 hr tablet Take 60 mg by mouth daily. Patient takes 2 tablets (120 mg) in the morning. He takes 1 tablet (60 mg) in the evening.    . loperamide (IMODIUM) 2 MG capsule Take 1 capsule (2 mg total) by mouth as needed for diarrhea or loose stools. 30 capsule 0  . metoprolol succinate (TOPROL-XL) 100 MG 24 hr tablet Take 1 tablet (100 mg total) by mouth daily after supper. Take with or immediately following a meal. 60 tablet 3  . nitroGLYCERIN (NITROSTAT) 0.4 MG SL tablet Place 0.4 mg  every 5 (five) minutes as needed under the tongue for chest pain.    . Rivaroxaban (XARELTO) 15 MG TABS tablet Take 1 tablet (15 mg total) by mouth daily with supper. 60 tablet 3  . rosuvastatin (CRESTOR) 40 MG tablet Take 40 mg daily after supper by mouth.      No current facility-administered medications for this visit.     Allergies:   Cefuroxime; Ezetimibe; and Pravastatin   Social History:  The patient  reports that he quit smoking about 39 years ago. His smoking use included cigarettes. He has a 40.00 pack-year smoking history. he has never used smokeless tobacco. He reports that he does not drink alcohol or use drugs.   Family History:  The patient's  family history includes Heart disease in his father; Lung cancer in his maternal grandmother; Stomach cancer in his paternal grandfather.    ROS:  Please see the history of present illness.  Otherwise, review of systems is positive for fatigue.  All other systems are reviewed and negative.    PHYSICAL EXAM: VS:  BP (!) 142/78   Pulse 98   Ht 5\' 9"  (1.753 m)   Wt 211 lb 12.8 oz (96.1 kg)   SpO2 97%   BMI 31.28 kg/m  , BMI Body mass index is 31.28 kg/m. GEN: Elderly male, in no acute distress  HEENT: normal except for poor dentition Neck: no JVD, no masses. Bilateral  carotid bruits Cardiac: RRR with 3/6 harsh late peaking systolic murmur at the RUSB, no diastolic murmur                Respiratory:  clear to auscultation bilaterally, normal work of breathing GI: soft, nontender, nondistended, + BS MS: no deformity or atrophy  Ext: 1+ bilateral pretibial edema Skin: warm and dry, no rash Neuro:  Strength and sensation are intact Psych: euthymic mood, full affect  EKG:  EKG is not ordered today.  Recent Labs: 09/14/2017: TSH 3.159 09/15/2017: ALT 13 09/21/2017: B Natriuretic Peptide 237.3 09/25/2017: BUN 29; Creatinine, Ser 2.25; Hemoglobin 13.8; Platelets 146; Potassium 3.9; Sodium 139   Lipid Panel     Component  Value Date/Time   CHOL 135 09/15/2017 0255   TRIG 126 09/20/2017 2143   HDL 28 (L) 09/15/2017 0255   CHOLHDL 4.8 09/15/2017 0255   VLDL 34 09/15/2017 0255   LDLCALC 73 09/15/2017 0255      Wt Readings from Last 3 Encounters:  11/11/17 211 lb 12.8 oz (96.1 kg)  09/25/17 212 lb 4.9 oz (96.3 kg)  12/06/15 208 lb (94.3 kg)     Cardiac Studies Reviewed: Echo 09/15/2017: Left ventricle:   Wall thickness was increased in a pattern of severe LVH.   There was concentric hypertrophy. The estimated ejection fraction was in the range of 55% to 60%.  Regional wall motion abnormalities:  Septal wall  dyskinesis. Doppler parameters are consistent with grade II diastolic dysfunction, elevated LAP.   ------------------------------------------------------------------- Aortic valve:   Severely calcified annulus. Severely calcified leaflets. Cusp separation was reduced.  Doppler:   There was severe stenosis.      VTI ratio of LVOT to aortic valve: 0.26. Indexed valve area (VTI): 0.37 cm^2/m^2. Peak velocity ratio of LVOT to aortic valve: 0.29. Valve area (Vmax): 0.9 cm^2. Indexed valve area (Vmax): 0.4 cm^2/m^2. Mean velocity ratio of LVOT to aortic valve: 0.26. Indexed valve area (Vmean): 0.36 cm^2/m^2.    Mean gradient (S): 36 mm Hg. Peak gradient (S): 64 mm Hg.  ------------------------------------------------------------------- Mitral valve:   Moderately calcified annulus. Moderately calcified leaflets .  Doppler:   The findings are consistent with trivial stenosis.      Peak gradient (D): 5 mm Hg.  ------------------------------------------------------------------- Left atrium:  The atrium was moderately dilated.  ------------------------------------------------------------------- Right ventricle:  The cavity size was mildly dilated. Systolic function was normal.  ------------------------------------------------------------------- Pulmonic valve:    The valve appears to be grossly  normal. Doppler:  There was no significant regurgitation.  ------------------------------------------------------------------- Tricuspid valve:  Poorly visualized.  The valve appears to be grossly normal.    Doppler:  There was no significant regurgitation.  ------------------------------------------------------------------- Right atrium:  The atrium was mildly dilated.  ------------------------------------------------------------------- Pericardium:  The pericardium was normal in appearance.  ------------------------------------------------------------------- Measurements   Left ventricle                           Value          Reference  LV ID, ED, PLAX chordal          (L)     37.3  mm       43 - 52  LV ID, ES, PLAX chordal                  24.2  mm       23 - 38  LV fx shortening, PLAX chordal           35    %        >=29  LV PW thickness, ED                      18.4  mm       ----------  IVS/LV PW ratio, ED                      1.01           <=1.3  Stroke volume, 2D                        60    ml       ----------  Stroke volume/bsa, 2D                    27    ml/m^2   ----------  LV e&', lateral                           6.53  cm/s     ----------  LV E/e&', lateral                         17.61          ----------  LV e&', medial  5.87  cm/s     ----------  LV E/e&', medial                          19.59          ----------  LV e&', average                           6.2   cm/s     ----------  LV E/e&', average                         18.55          ----------    Ventricular septum                       Value          Reference  IVS thickness, ED                        18.6  mm       ----------    LVOT                                     Value          Reference  LVOT ID, S                               20    mm       ----------  LVOT area                                3.14  cm^2     ----------  LVOT peak velocity, S                     114   cm/s     ----------  LVOT mean velocity, S                    70.6  cm/s     ----------  LVOT VTI, S                              19.1  cm       ----------  LVOT peak gradient, S                    5     mm Hg    ----------    Aortic valve                             Value          Reference  Aortic valve peak velocity, S            399   cm/s     ----------  Aortic valve mean velocity, S            274   cm/s     ----------  Aortic valve VTI, S  72.2  cm       ----------  Aortic mean gradient, S                  36    mm Hg    ----------  Aortic peak gradient, S                  64    mm Hg    ----------  VTI ratio, LVOT/AV                       0.26           ----------  Aortic valve area/bsa, VTI               0.37  cm^2/m^2 ----------  Velocity ratio, peak, LVOT/AV            0.29           ----------  Aortic valve area, peak velocity         0.9   cm^2     ----------  Aortic valve area/bsa, peak              0.4   cm^2/m^2 ----------  velocity  Velocity ratio, mean, LVOT/AV            0.26           ----------  Aortic valve area/bsa, mean              0.36  cm^2/m^2 ----------  velocity    Aorta                                    Value          Reference  Aortic root ID, ED                       26    mm       ----------    Left atrium                              Value          Reference  LA ID, A-P, ES                           43    mm       ----------  LA ID/bsa, A-P                           1.92  cm/m^2   <=2.2  LA volume, S                             81.7  ml       ----------  LA volume/bsa, S                         36.5  ml/m^2   ----------  LA volume, ES, 1-p A4C                   92.1  ml       ----------  LA volume/bsa, ES, 1-p A4C  41.2  ml/m^2   ----------  LA volume, ES, 1-p A2C                   72.2  ml       ----------  LA volume/bsa, ES, 1-p A2C               32.3  ml/m^2   ----------    Mitral valve                              Value          Reference  Mitral E-wave peak velocity              115   cm/s     ----------  Mitral A-wave peak velocity              110   cm/s     ----------  Mitral deceleration time                 204   ms       150 - 230  Mitral peak gradient, D                  5     mm Hg    ----------  Mitral E/A ratio, peak                   1              ----------    Right atrium                             Value          Reference  RA ID, S-I, ES, A4C              (H)     54.3  mm       34 - 49  RA area, ES, A4C                         18.5  cm^2     8.3 - 19.5  RA volume, ES, A/L                       51.8  ml       ----------  RA volume/bsa, ES, A/L                   23.2  ml/m^2   ----------    Systemic veins                           Value          Reference  Estimated CVP                            3     mm Hg    ----------    Right ventricle                          Value          Reference  RV ID, minor axis, ED, A4C base          47.9  mm       ----------  TAPSE                                    18.3  mm       ----------  RV s&', lateral, S                        14    cm/s     ----------  Cardiac Cath 11-20-2-18: Conclusion     Mid Graft lesion is 90% stenosed.   Severe native vessel CAD Patent grafts 4/4  (LIMA-LAD, seq SVG-PDA, PLA, seq SVG-OM1, OM2, SVG-Diag1) Severe focal 90% stenosis SVG-Diag1 WHO Grp II Pulmonary hypertension Mean PA 32 mmhg  Recommendation: Heart team discussion regarding management of CAD and severe aortic stenosis Continue ASA/brilinta and heparin.  Nigel Mormon, MD [facility] Pager: [phone] Office: [phone] If no answer Cell [phone]     Indications   Severe aortic stenosis [I35.0 (ICD-10-CM)]  NSTEMI (non-ST elevated myocardial infarction) ([place]) [I21.4 (ICD-10-CM)]  Procedural Details/Technique   Technical Details Procedures: 1. Ultrasound guided left radial artery, Rt CFV, and  Rt CFA access 2. Right heart catheterization 3. Left heart catheterization 4. Selective left and right coronary angiography 5. Selective bypass graft angiography. 6. Conscious sedation monitoring 94 min  Indication: Severe aortic stenosis NSTEMI  History:  77 y/o CM w/severe aortic stenosis, CAD s/p CABG (LIMA-LAD, seq SVG-PDA, PLA, seq SVG-OM1, OM2, SVG-Diag1), hypertension, uncontrolled type 2 DM, CKD stage 3/4, admitted with NSTEMI. He is recommended to undergo RHC and coronary angiography.  Catheter/s advanced over guidewire under fluoroscopy 4 Fr balloon tipped wedge catheter 6 Fr JL 3.5 5 Fr JR 4 6 Fr AR 1 5 Fr IMA 6 Fr 3DRC 5 Fr LCB  Wires used: Inquire 0.025 glidewire   Pressures tracings obtained in right atrium, right ventricle, pulmonary artery, and pulmonary capillary wedge position.   Anticoagulation:  5,000 units heparin  Total contrast used: 125 cc   Total fluoro time: 12.6 min Air Kerma: 463 mGy  All wires and catheters removed out of the body at the end of the procedure Final angiogram showed no dissection/perforation         Estimated blood loss <50 mL.  During this procedure the patient was administered the following to achieve and maintain moderate conscious sedation: Versed 2 mg, Fentanyl 50 mcg, while the patient's heart rate, blood pressure, and oxygen saturation were continuously monitored. The period of conscious sedation was 94 minutes, of which I was present face-to-face 100% of this time.  Complications   Complications documented before study signed (09/23/2017 11:14 AM EST)    RIGHT HEART CATH AND CORONARY/GRAFT ANGIOGRAPHY   None Documented by Nigel Mormon, MD 09/17/2017 7:23 PM EST  Time Range: Intra-procedure      Coronary Findings   Diagnostic  Dominance: Right  Left Main  Ost LM to Mid LM lesion 60% stenosed  Ost LM to Mid LM lesion is 60% stenosed.  Mid LM to Dist LM lesion 60% stenosed  Mid LM to Dist  LM lesion is 60% stenosed.  Left Anterior Descending  Ost LAD lesion 100% stenosed  Ost LAD lesion is 100% stenosed.  Prox LAD to Mid LAD lesion 60% stenosed  Prox LAD to Mid LAD lesion is 60% stenosed. Small caliber  Mid LAD lesion 70% stenosed  Mid LAD lesion is 70% stenosed. Small caliber vessel  Left Circumflex  Prox Cx lesion 99% stenosed with side branch in Ost 1st Mrg 100% stenosed  Prox Cx lesion is 99% stenosed with 100% stenosed side branch in Ost 1st Mrg.  First Obtuse Marginal Branch  1st Mrg lesion 80% stenosed  1st Mrg lesion is 80% stenosed.  Second Obtuse Marginal Branch  Ost 2nd Mrg to 2nd Mrg lesion 90% stenosed  Ost 2nd Mrg to 2nd Mrg lesion is 90% stenosed.  Right Coronary Artery  Prox RCA lesion 100% stenosed  Prox RCA lesion is 100% stenosed.  Mid RCA lesion 60% stenosed  Mid RCA lesion is 60% stenosed.  Right Posterior Descending Artery  Vessel is small in size. There is moderate disease in the vessel.  Right Posterior Atrioventricular Branch  Vessel is small in size. There is moderate disease in the vessel.  LIMA Graft to Mid LAD  The graft exhibits no disease.  Sequential Graft to 1st Mrg, 2nd Mrg  Sequential Graft to RPDA, 1st RPLB  Graft to 1st Diag  The graft exhibits severe focal disease. Severe focal 90% stenosis  Mid Graft lesion 90% stenosed  Mid Graft lesion is 90% stenosed.  Intervention   No interventions have been documented.  Right Heart   Right Heart Pressures Hemodynamic findings consistent with mild pulmonary hypertension.  Right Atrium Right atrial pressure is elevated.  Coronary Diagrams   Diagnostic Diagram       Implants     No implant documentation for this case.  MERGE Images   Show images for CARDIAC CATHETERIZATION   Link to Procedure Log   Procedure Log    Hemo Data    Most Recent Value  Fick Cardiac Output 4.57 L/min  Fick Cardiac Output Index 2.14 (L/min)/BSA  RA A Wave 10 mmHg  RA V Wave 9 mmHg  RA  Mean 7 mmHg  RV Systolic Pressure 45 mmHg  RV Diastolic Pressure 7 mmHg  RV EDP 9 mmHg  PA Systolic Pressure 56 mmHg  PA Diastolic Pressure 15 mmHg  PA Mean 32 mmHg  PW A Wave 21 mmHg  PW V Wave 30 mmHg  PW Mean 21 mmHg  AO Systolic Pressure 244 mmHg  AO Diastolic Pressure 71 mmHg  AO Mean 108 mmHg  QP/QS 1  TPVR Index 14.99 HRUI  TSVR Index 50.6 HRUI  PVR SVR Ratio 0.11  TPVR/TSVR Ratio 0.3     ASSESSMENT AND PLAN: 1.  Severe, Stage D1, symptomatic degenerative aortic valve stenosis 2. CAD, native vessel and bypass graft disease, with CCS III angina 3. CKD IV 4. Type II DM 5. Paroxysmal atrial fibrillation  The patient and his family are counseled extensively today regarding further treatment options for aortic stenosis. Because of his comorbid conditions outlined above, I do not think he would be a candidate for redo sternotomy and surgical AVR. However, TAVR is a reasonable consideration. We discussed necessary evaluation at length today. He understand he will require CT angio studies with limited contrast protocol as well as formal cardiac surgical consultation (Dr Prescott Gum performed the patient's CABG). His case is difficult and he understands the potential risks of TAVR are increased with his kidney disease, heart failure, and significant ischemic burden. I have again reviewed his coronary angio films which demonstrate patency of the LIMA-LAD, SVG-OM, and SVG-RCA branches. The diagonal graft was intervened upon with distal embolization/no-reflow. The native OM beyond the graft insertion has severe stenosis. While he is most limited by angina, I think the best approach to his treatment is to  first address aortic stenosis then if he has persistent lifestyle limiting angina, further PCI of the native OM branches could be considered. The patient and his family understand the risk of progressive kidney injury with further contrast studies and we will follow renal protocols with these  studies. I have also reviewed the specific risks of TAVR, including vascular injury, heart block requiring PPM, stroke, MI, paravalvular regurgitation, valve embolization, and death. He understand these risks and wishes to proceed.   Next steps: CTA studies and cardiac surgical consultation. Continue same Rx.   Current medicines are reviewed with the patient today.  The patient does not have concerns regarding medicines.  Labs/ tests ordered today include:  No orders of the defined types were placed in this encounter.   Disposition:   FU pending further test results  Signed, Sherren Mocha, MD  11/15/2017 8:14 AM    [facility] [place], [place], [place]  [address] Phone: [phone]; Fax: [phone]

## 2017-11-13 ENCOUNTER — Other Ambulatory Visit: Payer: Self-pay

## 2017-11-13 DIAGNOSIS — I350 Nonrheumatic aortic (valve) stenosis: Secondary | ICD-10-CM

## 2017-11-15 ENCOUNTER — Other Ambulatory Visit: Payer: Self-pay

## 2017-11-15 ENCOUNTER — Encounter: Payer: Self-pay | Admitting: Cardiovascular Disease

## 2017-11-15 DIAGNOSIS — N289 Disorder of kidney and ureter, unspecified: Secondary | ICD-10-CM

## 2017-11-15 DIAGNOSIS — I350 Nonrheumatic aortic (valve) stenosis: Secondary | ICD-10-CM

## 2017-11-20 ENCOUNTER — Encounter (HOSPITAL_COMMUNITY): Payer: Self-pay

## 2017-11-20 ENCOUNTER — Other Ambulatory Visit: Payer: Self-pay

## 2017-11-20 ENCOUNTER — Ambulatory Visit (HOSPITAL_COMMUNITY)
Admission: RE | Admit: 2017-11-20 | Discharge: 2017-11-20 | Disposition: A | Discharge status: Home / Self Care | Payer: Medicare Other | Source: Ambulatory Visit | Attending: Cardiovascular Disease | Admitting: Cardiovascular Disease

## 2017-11-20 ENCOUNTER — Encounter: Payer: Self-pay | Admitting: Thoracic Surgery (Cardiothoracic Vascular Surgery)

## 2017-11-20 ENCOUNTER — Institutional Professional Consult (permissible substitution): Payer: Medicare Other | Admitting: Thoracic Surgery (Cardiothoracic Vascular Surgery)

## 2017-11-20 VITALS — BP 158/74 | HR 80 | Resp 18 | Ht 69.0 in | Wt 200.0 lb

## 2017-11-20 DIAGNOSIS — I350 Nonrheumatic aortic (valve) stenosis: Secondary | ICD-10-CM

## 2017-11-20 DIAGNOSIS — Z951 Presence of aortocoronary bypass graft: Secondary | ICD-10-CM | POA: Insufficient documentation

## 2017-11-20 DIAGNOSIS — N289 Disorder of kidney and ureter, unspecified: Secondary | ICD-10-CM

## 2017-11-20 DIAGNOSIS — I2510 Atherosclerotic heart disease of native coronary artery without angina pectoris: Secondary | ICD-10-CM | POA: Insufficient documentation

## 2017-11-20 DIAGNOSIS — K8020 Calculus of gallbladder without cholecystitis without obstruction: Secondary | ICD-10-CM | POA: Diagnosis not present

## 2017-11-20 DIAGNOSIS — I700 Atherosclerosis of aorta: Secondary | ICD-10-CM | POA: Diagnosis not present

## 2017-11-20 DIAGNOSIS — Z9889 Other specified postprocedural states: Secondary | ICD-10-CM | POA: Diagnosis not present

## 2017-11-20 LAB — BASIC METABOLIC PANEL
ANION GAP: 11 (ref 5–15)
BUN: 43 mg/dL — ABNORMAL HIGH (ref 6–20)
CO2: 28 mmol/L (ref 22–32)
Calcium: 9.4 mg/dL (ref 8.9–10.3)
Chloride: 102 mmol/L (ref 101–111)
Creatinine, Ser: 3 mg/dL — ABNORMAL HIGH (ref 0.61–1.24)
GFR calc Af Amer: 22 mL/min — ABNORMAL LOW (ref >60)
GFR calc non Af Amer: 19 mL/min — ABNORMAL LOW (ref >60)
Glucose, Bld: 173 mg/dL — ABNORMAL HIGH (ref 65–99)
POTASSIUM: 4.3 mmol/L (ref 3.5–5.1)
SODIUM: 141 mmol/L (ref 135–145)

## 2017-11-20 LAB — GLUCOSE, CAPILLARY: Glucose-Capillary: 173 mg/dL — ABNORMAL HIGH (ref 65–99)

## 2017-11-20 MED ORDER — SODIUM BICARBONATE BOLUS VIA INFUSION
INTRAVENOUS | Status: AC
  Administered 2017-11-20: 1 meq via INTRAVENOUS
  Filled 2017-11-20: qty 1

## 2017-11-20 MED ORDER — IOPAMIDOL (ISOVUE-370) INJECTION 76%
INTRAVENOUS | Status: AC
  Administered 2017-11-20: 60 mL
  Filled 2017-11-20: qty 50

## 2017-11-20 MED ORDER — SODIUM BICARBONATE 8.4 % IV SOLN
INTRAVENOUS | Status: AC
  Administered 2017-11-20: 12:00:00 via INTRAVENOUS
  Filled 2017-11-20: qty 500

## 2017-11-20 NOTE — Progress Notes (Signed)
[facility]  MULTIDISCIPLINARY HEART VALVE CLINIC  CARDIOTHORACIC SURGERY CONSULTATION REPORT  Referring Provider is Patwardhan, Reynold Bowen, MD PCP is Parke Poisson, MD  Chief Complaint  Patient presents with  . Aortic Stenosis    severe AS, eval for TAVR    HPI:  Patient is a 77 year old male with history of coronary artery disease status post coronary artery bypass grafting x4 in 2002 and recently status post PCI of patent but diseased saphenous vein graft to the diagonal branch, aortic stenosis, chronic diastolic congestive heart failure, hypertension, hyperlipidemia, stage III chronic kidney disease, COPD, chronic cough, right bundle branch block, persistent atrial fibrillation recently started on long-term anticoagulation, and diabetes mellitus who has been referred for surgical consultation to discuss treatment options for management of severe symptomatic aortic stenosis.  The patient's cardiac history dates back to 2002 when he presented with exertional angina.  He was found to have three-vessel coronary artery disease and underwent coronary artery bypass grafting x4.  His procedure was uncomplicated and he has done well from a cardiac standpoint until fairly recently.  For many years he was followed by Dr. Rex Kras, and more recently he has been followed by Dr. Einar Gip and Dr. Earnie Larsson.   Patient states that over the past year or 2 he has developed progressive symptoms of exertional chest discomfort, shortness of breath, and fatigue.  This fall it got particularly severe with 3 somewhat prolonged episodes of chest discomfort and shortness of breath following exertion that developed since Labor Day.    On September 14, 2017 he developed severe chest pain at rest associated with weakness and fatigue.  He was hospitalized and ruled in for non-ST segment elevation myocardial infarction with serial cardiac enzymes increasing from 0.05-0.18.  He was noted to have mild pulmonary  edema and mildly elevated BNP consistent with acute exacerbation of chronic diastolic congestive heart failure.  Follow-up transthoracic echocardiogram performed September 15, 2017 revealed normal left ventricular systolic function with ejection fraction estimated 55-60%.  There was severe aortic stenosis with peak velocity across the aortic valve measured 4.0 m/s corresponding to mean transvalvular gradient estimated 36 mmHg and DVI measured 0.26.  Initial plans for diagnostic catheterization were postponed due to acute exacerbation of chronic kidney disease, but once the patient's renal function stabilized the patient underwent catheterization on September 17, 2017.  Catheterization revealed severe native coronary artery disease with continued patency of all 4 previously placed bypass grafts but severe 90% focal stenosis of the saphenous vein graft to the diagonal branch.  There was mild pulmonary hypertension.  The patient was seen in consultation by Dr. Burt Knack who felt that it was reasonable to proceed with plans for PCI and stenting of the diseased vein graft to the diagonal branch followed by delayed evaluation for possible transcatheter aortic valve replacement.  On September 23, 2017 the patient underwent balloon angioplasty of the diseased vein graft to diagonal branch.  There was distal embolization in the native vessel with no reflow.  A stent was not placed.  During the patient's hospitalization he was also noted to be in atrial fibrillation with underlying right bundle branch block.  He was ultimately discharged home on Plavix and Xarelto.  Since hospital discharge patient underwent CT angiography and surgical consultation has been requested.  The patient is married and lives with his wife in [place].  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  He has remained functionally independent and reasonably active physically  throughout retirement.  He admits that he  does not exercise at all on a regular basis.  He notes that over the past 2 years he has developed slow gradual decline in exercise tolerance with progressive fatigue and exertional shortness of breath.  He has had a dry nonproductive cough that has persisted and only finally started to improve recently since he has been started on diuretics.  He has developed bilateral lower extremity edema.  He gets short of breath with moderate level activity.  Other than his single episode of chest discomfort in November he denies any episodes of resting shortness of breath.  He has not had PND, orthopnea, dizzy spells, nor syncope.  Since hospital discharge and recent PCI he has still had occasional episodes of chest discomfort that are relieved by administration of nitroglycerin.  He states that recently he gets these brief episodes of chest discomfort most commonly when he bends over to put his shoes on.  Past Medical History:  Diagnosis Date  . Atrial fibrillation ([place])   . CAD (coronary artery disease)    a. 2002: s/p CABG x 4V (LIMA-LAD, seq SVG-PDA, PLA, seq SVG-OM1, OM2, SVG-Diag1)  . CKD (chronic kidney disease)   . COPD (chronic obstructive pulmonary disease) ([place])   . Diabetes mellitus without complication ([place])   . HLD (hyperlipidemia)   . Hypertension   . Obesity   . Severe aortic stenosis     Past Surgical History:  Procedure Laterality Date  . CARDIAC SURGERY    . CORONARY BALLOON ANGIOPLASTY N/A 09/23/2017   Procedure: CORONARY BALLOON ANGIOPLASTY;  Surgeon: Nigel Mormon, MD;  Location: [facility];  Service: Cardiovascular;  Laterality: N/A;  . FINGER AMPUTATION    . HERNIA REPAIR    . open heart surgery    . RIGHT HEART CATH AND CORONARY/GRAFT ANGIOGRAPHY N/A 09/17/2017   Procedure: RIGHT HEART CATH AND CORONARY/GRAFT ANGIOGRAPHY;  Surgeon: Nigel Mormon, MD;  Location: [facility];  Service: Cardiovascular;  Laterality: N/A;  . TOE AMPUTATION    .  ULTRASOUND GUIDANCE FOR VASCULAR ACCESS  09/17/2017   Procedure: Ultrasound Guidance For Vascular Access;  Surgeon: Nigel Mormon, MD;  Location: [facility];  Service: Cardiovascular;;    Family History  Problem Relation Age of Onset  . Heart disease Father   . Stomach cancer Paternal Grandfather   . Lung cancer Maternal Grandmother        smoked    Social History   Socioeconomic History  . Marital status: Married    Spouse name: Not on file  . Number of children: 2  . Years of education: Not on file  . Highest education level: Not on file  Social Needs  . Financial resource strain: Not on file  . Food insecurity - worry: Not on file  . Food insecurity - inability: Not on file  . Transportation needs - medical: Not on file  . Transportation needs - non-medical: Not on file  Occupational History  . Occupation: Doctor, general practice  Tobacco Use  . Smoking status: Former Smoker    Packs/day: 2.00    Years: 20.00    Pack years: 40.00    Types: Cigarettes    Last attempt to quit: 09/28/1978    Years since quitting: 39.1  . Smokeless tobacco: Never Used  Substance and Sexual Activity  . Alcohol use: No  . Drug use: No  . Sexual activity: Not on file  Other Topics Concern  .  Not on file  Social History Narrative  . Not on file    Current Outpatient Medications  Medication Sig Dispense Refill  . clopidogrel (PLAVIX) 75 MG tablet Take 1 tablet (75 mg total) by mouth daily. 60 tablet 3  . furosemide (LASIX) 40 MG tablet Take 40 mg by mouth.    . insulin NPH (HUMULIN N,NOVOLIN N) 100 UNIT/ML injection Inject 50 Units See admin instructions into the skin. Inject 50 unit subcutaneously twice daily - after lunch and supper    . insulin regular (NOVOLIN R,HUMULIN R) 100 units/mL injection Inject 35 Units See admin instructions into the skin. Inject 35 units subcutaneously twice daily - after lunch and supper    . isosorbide mononitrate (IMDUR) 60 MG 24 hr  tablet Take 60 mg by mouth daily. Patient takes 2 tablets (120 mg) in the morning. He takes 1 tablet (60 mg) in the evening.    . metoprolol succinate (TOPROL-XL) 100 MG 24 hr tablet Take 1 tablet (100 mg total) by mouth daily after supper. Take with or immediately following a meal. 60 tablet 3  . nitroGLYCERIN (NITROSTAT) 0.4 MG SL tablet Place 0.4 mg every 5 (five) minutes as needed under the tongue for chest pain.    . Rivaroxaban (XARELTO) 15 MG TABS tablet Take 1 tablet (15 mg total) by mouth daily with supper. 60 tablet 3  . rosuvastatin (CRESTOR) 40 MG tablet Take 40 mg daily after supper by mouth.     Marland Kitchen amLODipine (NORVASC) 5 MG tablet Take 1 tablet (5 mg total) by mouth daily. (Patient not taking: Reported on 11/20/2017) 30 tablet 0  . citalopram (CELEXA) 10 MG tablet Take 1 tablet (10 mg total) by mouth daily. (Patient not taking: Reported on 11/20/2017) 60 tablet 3   No current facility-administered medications for this visit.     Allergies  Allergen Reactions  . Cefuroxime Diarrhea  . Ezetimibe Diarrhea  . Pravastatin Other (See Comments)    myalgias      Review of Systems:   General:  normal appetite, decreased energy, no weight gain, no weight loss, no fever  Cardiac:  + chest pain with exertion, + one episond chest pain at rest, + SOB with exertion, no resting SOB, no PND, no orthopnea, no palpitations, + arrhythmia, + atrial fibrillation, + LE edema, no dizzy spells, no syncope  Respiratory:  + shortness of breath, no home oxygen, no productive cough, + chronic dry cough, no bronchitis, no wheezing, no hemoptysis, no asthma, no pain with inspiration or cough, no sleep apnea, no CPAP at night  GI:   no difficulty swallowing, no reflux, no frequent heartburn, no hiatal hernia, no abdominal pain, no constipation, no diarrhea, no hematochezia, no hematemesis, no melena  GU:   no dysuria,  no frequency, no urinary tract infection, no hematuria, no enlarged prostate, no kidney  stones, + kidney disease  Vascular:  no pain suggestive of claudication, no pain in feet, no leg cramps, no varicose veins, no DVT, no non-healing foot ulcer  Neuro:   no stroke, no TIA's, no seizures, no headaches, no temporary blindness one eye,  no slurred speech, no peripheral neuropathy, no chronic pain, no instability of gait, no memory/cognitive dysfunction  Musculoskeletal: no arthritis, no joint swelling, no myalgias, no difficulty walking, normal mobility   Skin:   no rash, no itching, no skin infections, no pressure sores or ulcerations  Psych:   no anxiety, no depression, no nervousness, no unusual recent stress  Eyes:  no blurry vision, no floaters, no recent vision changes, does not wears glasses or contacts  ENT:   no hearing loss, no loose or painful teeth, no dentures, last saw dentist last month  Hematologic:  no easy bruising, no abnormal bleeding, no clotting disorder, no frequent epistaxis  Endocrine:  + diabetes, does check CBG's at home           Physical Exam:   BP (!) 158/74   Pulse 80   Resp 18   Ht 5\' 9"  (1.753 m)   Wt 200 lb (90.7 kg)   SpO2 95% Comment: on RA  BMI 29.53 kg/m   General:  Moderately obese,  well-appearing  HEENT:  Unremarkable   Neck:   no JVD, no bruits, no adenopathy   Chest:   clear to auscultation, symmetrical breath sounds, no wheezes, no rhonchi   CV:   Irregular rate and rhythm, grade III/VI crescendo/decrescendo murmur heard best at LLSB,  no diastolic murmur  Abdomen:  soft, non-tender, no masses   Extremities:  warm, well-perfused, pulses diminished but palpable, + mild bilateral LE edema  Rectal/GU  Deferred  Neuro:   Grossly non-focal and symmetrical throughout  Skin:   Clean and dry, no rashes, no breakdown   Diagnostic Tests:  Transthoracic Echocardiography  Patient:    Ralf, [place] MR #:       [id number] Study Date: 09/15/2017 Gender:     M Age:        76 Height:     175.3 cm Weight:     99.8 kg BSA:         2.24 m^2 Pt. Status: Room:       [place]    Adrian Prows, MD  PERFORMING   Adrian Prows, MD  REFERRING    Adrian Prows, MD  ORDERING     Kela Millin R  SONOGRAPHER  Cardell Peach, RDCS  cc:  ------------------------------------------------------------------- LV EF: 55% -   60%  ------------------------------------------------------------------- History:   PMH:  chest pain  Atrial fibrillation.  Risk factors:  Hypertension. Diabetes mellitus.  ------------------------------------------------------------------- Study Conclusions  - Left ventricle: Wall thickness was increased in a pattern of   severe LVH. There was concentric hypertrophy. The estimated   ejection fraction was in the range of 55% to 60%. Septal wall   dyskinesis. Doppler parameters are consistent with grade II   diastolic dysfunction, elevated LAP. - Aortic valve: Severely calcified annulus. Severely calcified   leaflets. Cusp separation was reduced. There was severe stenosis.   Valve area (Vmax): 0.9 cm^2. Vmax 4.1 cm, Mean PG 41 mmHg. - Mitral valve: Moderately calcified annulus. Moderately calcified   leaflets with restricted movement of posterior mitral valve   leaflet. The findings are consistent with trivial stenosis. - Left atrium: The atrium was moderately dilated. - Right ventricle: The cavity size was mildly dilated. - Right atrium: The atrium was mildly dilated.  Impressions:  - Preserved LV systolic function s/p CABG. Grade II diastolic   dysfunction, elevated LAP. Severe aortic stenosis. Trace calcific   mitral stenosis. Biatrial enlargement. Mild RV dilatation with   preserved systolic function.  ------------------------------------------------------------------- Study data:  No prior study was available for comparison.  Study status:  Routine.  Procedure:  Transthoracic echocardiography. Image quality was adequate.  Study completion:  There were no complications.           Transthoracic echocardiography.  M-mode, complete 2D, spectral Doppler, and color Doppler.  Birthdate: Patient birthdate: [birth year].  Age:  Patient is 77 yr old.  Sex: Gender: male.    BMI: 32.5 kg/m^2.  Blood pressure:     149/92 Patient status:  Inpatient.  Study date:  Study date: 09/15/2017. Study time: 03:44 PM.  Location:  Bedside.  -------------------------------------------------------------------  ------------------------------------------------------------------- Left ventricle:   Wall thickness was increased in a pattern of severe LVH.   There was concentric hypertrophy. The estimated ejection fraction was in the range of 55% to 60%.  Regional wall motion abnormalities:  Septal wall dyskinesis. Doppler parameters are consistent with grade II diastolic dysfunction, elevated LAP.   ------------------------------------------------------------------- Aortic valve:   Severely calcified annulus. Severely calcified leaflets. Cusp separation was reduced.  Doppler:   There was severe stenosis.      VTI ratio of LVOT to aortic valve: 0.26. Indexed valve area (VTI): 0.37 cm^2/m^2. Peak velocity ratio of LVOT to aortic valve: 0.29. Valve area (Vmax): 0.9 cm^2. Indexed valve area (Vmax): 0.4 cm^2/m^2. Mean velocity ratio of LVOT to aortic valve: 0.26. Indexed valve area (Vmean): 0.36 cm^2/m^2.    Mean gradient (S): 36 mm Hg. Peak gradient (S): 64 mm Hg.  ------------------------------------------------------------------- Mitral valve:   Moderately calcified annulus. Moderately calcified leaflets .  Doppler:   The findings are consistent with trivial stenosis.      Peak gradient (D): 5 mm Hg.  ------------------------------------------------------------------- Left atrium:  The atrium was moderately dilated.  ------------------------------------------------------------------- Right ventricle:  The cavity size was mildly dilated. Systolic function was  normal.  ------------------------------------------------------------------- Pulmonic valve:    The valve appears to be grossly normal. Doppler:  There was no significant regurgitation.  ------------------------------------------------------------------- Tricuspid valve:  Poorly visualized.  The valve appears to be grossly normal.    Doppler:  There was no significant regurgitation.  ------------------------------------------------------------------- Right atrium:  The atrium was mildly dilated.  ------------------------------------------------------------------- Pericardium:  The pericardium was normal in appearance.  ------------------------------------------------------------------- Measurements   Left ventricle                           Value          Reference  LV ID, ED, PLAX chordal          (L)     37.3  mm       43 - 52  LV ID, ES, PLAX chordal                  24.2  mm       23 - 38  LV fx shortening, PLAX chordal           35    %        >=29  LV PW thickness, ED                      18.4  mm       ----------  IVS/LV PW ratio, ED                      1.01           <=1.3  Stroke volume, 2D                        60    ml       ----------  Stroke volume/bsa, 2D                    27  ml/m^2   ----------  LV e&', lateral                           6.53  cm/s     ----------  LV E/e&', lateral                         17.61          ----------  LV e&', medial                            5.87  cm/s     ----------  LV E/e&', medial                          19.59          ----------  LV e&', average                           6.2   cm/s     ----------  LV E/e&', average                         18.55          ----------    Ventricular septum                       Value          Reference  IVS thickness, ED                        18.6  mm       ----------    LVOT                                     Value          Reference  LVOT ID, S                               20     mm       ----------  LVOT area                                3.14  cm^2     ----------  LVOT peak velocity, S                    114   cm/s     ----------  LVOT mean velocity, S                    70.6  cm/s     ----------  LVOT VTI, S                              19.1  cm       ----------  LVOT peak gradient, S                    5     mm Hg    ----------    Aortic  valve                             Value          Reference  Aortic valve peak velocity, S            399   cm/s     ----------  Aortic valve mean velocity, S            274   cm/s     ----------  Aortic valve VTI, S                      72.2  cm       ----------  Aortic mean gradient, S                  36    mm Hg    ----------  Aortic peak gradient, S                  64    mm Hg    ----------  VTI ratio, LVOT/AV                       0.26           ----------  Aortic valve area/bsa, VTI               0.37  cm^2/m^2 ----------  Velocity ratio, peak, LVOT/AV            0.29           ----------  Aortic valve area, peak velocity         0.9   cm^2     ----------  Aortic valve area/bsa, peak              0.4   cm^2/m^2 ----------  velocity  Velocity ratio, mean, LVOT/AV            0.26           ----------  Aortic valve area/bsa, mean              0.36  cm^2/m^2 ----------  velocity    Aorta                                    Value          Reference  Aortic root ID, ED                       26    mm       ----------    Left atrium                              Value          Reference  LA ID, A-P, ES                           43    mm       ----------  LA ID/bsa, A-P                           1.92  cm/m^2   <=2.2  LA volume, S  81.7  ml       ----------  LA volume/bsa, S                         36.5  ml/m^2   ----------  LA volume, ES, 1-p A4C                   92.1  ml       ----------  LA volume/bsa, ES, 1-p A4C               41.2  ml/m^2   ----------  LA volume, ES, 1-p A2C                    72.2  ml       ----------  LA volume/bsa, ES, 1-p A2C               32.3  ml/m^2   ----------    Mitral valve                             Value          Reference  Mitral E-wave peak velocity              115   cm/s     ----------  Mitral A-wave peak velocity              110   cm/s     ----------  Mitral deceleration time                 204   ms       150 - 230  Mitral peak gradient, D                  5     mm Hg    ----------  Mitral E/A ratio, peak                   1              ----------    Right atrium                             Value          Reference  RA ID, S-I, ES, A4C              (H)     54.3  mm       34 - 49  RA area, ES, A4C                         18.5  cm^2     8.3 - 19.5  RA volume, ES, A/L                       51.8  ml       ----------  RA volume/bsa, ES, A/L                   23.2  ml/m^2   ----------    Systemic veins                           Value          Reference  Estimated CVP  3     mm Hg    ----------    Right ventricle                          Value          Reference  RV ID, minor axis, ED, A4C base          47.9  mm       ----------  TAPSE                                    18.3  mm       ----------  RV s&', lateral, S                        14    cm/s     ----------  Legend: (L)  and  (H)  mark values outside specified reference range.  ------------------------------------------------------------------- Prepared and Electronically Authenticated by  Vernell Leep, MD 2018-11-19T07:56:05    RIGHT HEART CATH AND CORONARY/GRAFT ANGIOGRAPHY  Conclusion     Mid Graft lesion is 90% stenosed.   Severe native vessel CAD Patent grafts 4/4  (LIMA-LAD, seq SVG-PDA, PLA, seq SVG-OM1, OM2, SVG-Diag1) Severe focal 90% stenosis SVG-Diag1 WHO Grp II Pulmonary hypertension Mean PA 32 mmhg  Recommendation: Heart team discussion regarding management of CAD and severe aortic stenosis Continue ASA/brilinta and  heparin.  Nigel Mormon, MD [facility] [place] Pager: [phone] Office: [phone] If no answer Cell [phone]     Indications   Severe aortic stenosis [I35.0 (ICD-10-CM)]  NSTEMI (non-ST elevated myocardial infarction) ([place]) [I21.4 (ICD-10-CM)]  Procedural Details/Technique   Technical Details Procedures: 1. Ultrasound guided left radial artery, Rt CFV, and Rt CFA access 2. Right heart catheterization 3. Left heart catheterization 4. Selective left and right coronary angiography 5. Selective bypass graft angiography. 6. Conscious sedation monitoring 94 min  Indication: Severe aortic stenosis NSTEMI  History:  77 y/o CM w/severe aortic stenosis, CAD s/p CABG (LIMA-LAD, seq SVG-PDA, PLA, seq SVG-OM1, OM2, SVG-Diag1), hypertension, uncontrolled type 2 DM, CKD stage 3/4, admitted with NSTEMI. He is recommended to undergo RHC and coronary angiography.  Catheter/s advanced over guidewire under fluoroscopy 4 Fr balloon tipped wedge catheter 6 Fr JL 3.5 5 Fr JR 4 6 Fr AR 1 5 Fr IMA 6 Fr 3DRC 5 Fr LCB  Wires used: Inquire 0.025 glidewire   Pressures tracings obtained in right atrium, right ventricle, pulmonary artery, and pulmonary capillary wedge position.   Anticoagulation:  5,000 units heparin  Total contrast used: 125 cc   Total fluoro time: 12.6 min Air Kerma: 463 mGy  All wires and catheters removed out of the body at the end of the procedure Final angiogram showed no dissection/perforation         Estimated blood loss <50 mL.  During this procedure the patient was administered the following to achieve and maintain moderate conscious sedation: Versed 2 mg, Fentanyl 50 mcg, while the patient's heart rate, blood pressure, and oxygen saturation were continuously monitored. The period of conscious sedation was 94 minutes, of which I was present face-to-face 100% of this time.  Complications   Complications documented before  study signed (09/23/2017 11:14 AM EST)    RIGHT HEART CATH AND CORONARY/GRAFT ANGIOGRAPHY   None Documented by Nigel Mormon, MD 09/17/2017 7:23 PM EST  Time Range: Intra-procedure  Coronary Findings   Diagnostic  Dominance: Right  Left Main  Ost LM to Mid LM lesion 60% stenosed  Ost LM to Mid LM lesion is 60% stenosed.  Mid LM to Dist LM lesion 60% stenosed  Mid LM to Dist LM lesion is 60% stenosed.  Left Anterior Descending  Ost LAD lesion 100% stenosed  Ost LAD lesion is 100% stenosed.  Prox LAD to Mid LAD lesion 60% stenosed  Prox LAD to Mid LAD lesion is 60% stenosed. Small caliber  Mid LAD lesion 70% stenosed  Mid LAD lesion is 70% stenosed. Small caliber vessel  Left Circumflex  Prox Cx lesion 99% stenosed with side branch in Ost 1st Mrg 100% stenosed  Prox Cx lesion is 99% stenosed with 100% stenosed side branch in Ost 1st Mrg.  First Obtuse Marginal Branch  1st Mrg lesion 80% stenosed  1st Mrg lesion is 80% stenosed.  Second Obtuse Marginal Branch  Ost 2nd Mrg to 2nd Mrg lesion 90% stenosed  Ost 2nd Mrg to 2nd Mrg lesion is 90% stenosed.  Right Coronary Artery  Prox RCA lesion 100% stenosed  Prox RCA lesion is 100% stenosed.  Mid RCA lesion 60% stenosed  Mid RCA lesion is 60% stenosed.  Right Posterior Descending Artery  Vessel is small in size. There is moderate disease in the vessel.  Right Posterior Atrioventricular Branch  Vessel is small in size. There is moderate disease in the vessel.  LIMA Graft to Mid LAD  The graft exhibits no disease.  Sequential Graft to 1st Mrg, 2nd Mrg  Sequential Graft to RPDA, 1st RPLB  Graft to 1st Diag  The graft exhibits severe focal disease. Severe focal 90% stenosis  Mid Graft lesion 90% stenosed  Mid Graft lesion is 90% stenosed.  Intervention   No interventions have been documented.  Right Heart   Right Heart Pressures Hemodynamic findings consistent with mild pulmonary hypertension.  Right Atrium  Right atrial pressure is elevated.  Coronary Diagrams   Diagnostic Diagram       Implants     No implant documentation for this case.  MERGE Images   Show images for CARDIAC CATHETERIZATION   Link to Procedure Log   Procedure Log    Hemo Data    Most Recent Value  Fick Cardiac Output 4.57 L/min  Fick Cardiac Output Index 2.14 (L/min)/BSA  RA A Wave 10 mmHg  RA V Wave 9 mmHg  RA Mean 7 mmHg  RV Systolic Pressure 45 mmHg  RV Diastolic Pressure 7 mmHg  RV EDP 9 mmHg  PA Systolic Pressure 56 mmHg  PA Diastolic Pressure 15 mmHg  PA Mean 32 mmHg  PW A Wave 21 mmHg  PW V Wave 30 mmHg  PW Mean 21 mmHg  AO Systolic Pressure 294 mmHg  AO Diastolic Pressure 71 mmHg  AO Mean 108 mmHg  QP/QS 1  TPVR Index 14.99 HRUI  TSVR Index 50.6 HRUI  PVR SVR Ratio 0.11  TPVR/TSVR Ratio 0.3    CORONARY BALLOON ANGIOPLASTY  Conclusion     Mid Graft lesion is 90% stenosed.  Balloon angioplasty was performed.  Post intervention, there is a 0% residual stenosis.   Severe native vessel CAD Patent grafts 4/4 as noticed on cath 11/20 (LIMA-LAD, seq SVG-PDA, PLA, seq SVG-OM1, OM2, SVG-Diag1) Severe focal 90% stenosis SVG-Diag1  Successful dottering of SVG 90% stenosis. Focal angioplasty in native diagonal vessel. Distal embolization and no-reflow in native diagonal vessel Partial restablishement of flow in native diagonal vessel/  Recommendation: Plavix 75 mg at least for 1 year.  Continue aggrastat bolus and infusion for 18 hrs. Continue ASA Discharge home on plavix and xarelto Outpatient    Indications   NSTEMI (non-ST elevated myocardial infarction) ([facility]) [I21.4 (ICD-10-CM)]  Procedural Details/Technique   Technical Details Procedures: 1. Ultrasound guided radial artery access 2. Selective bypass graft angiography SVG-D1 3. Successful percutaneous coronary intervention  PTCA native Diag 4. Conscious sedation monitoring 171  min  Indication: NSTEMI  History: SHONDALE QUINLEY is a 77 y.o. male  77 y/o CM w/severe aortic stenosis, CAD s/p CABG (LIMA-LAD, seq SVG-PDA, PLA, seq SVG-OM1, OM2, SVG-Diag1), hypertension, uncontrolled type 2 DM, CKD stage 3/4, admitted with NSTEMI. He udnerwent diagnostic cath on 11/20 that showed Severe native vessel CAD Patent grafts 4/4 (LIMA-LAD, seq SVG-PDA, PLA, seq SVG-OM1, OM2, SVG-Diag1) Severe focal 90% stenosis SVG-Diag1, WHO Grp II Pulmonary hypertension Mean PA 32 mmhg  With patient's refusal for redo surgery and his high STS risk score, I discussed the case with Dr. Burt Knack from [name], who agreed with proceeding with PCI, and plan for outpatient TAVR. In the meantime, patient had developed PAF, and now on heparin. PCI was on hold due to increasing creatinine, which is now beginning to improve with good urine output.  I presented the following options to the patient.  #1 No PCI given his CKD, continue medical management. Pros: decreased risk of recurrent AKI. Cons: Chances of recurrent chest pain and hospitalization. #2 Proceed with PCI using as little contrast as possible. Pros: Chances of improving his chest pain symptoms, in addition to medical therapy. Cons: 2-5% risk of requiring dialysis, at least temporary.  I also discussed the case with Dr. Florene Glen, nephrology this morning. In his opinion, patient will likely need dialysis eventually, after PCI and/or after TAVR. It remains a high risk situation from both CAD and ESRD standpoint.   I have had extensive discussion with him regarding the risks of the procedure, including but not limited to bleeding, infection, arrhythmia, vessel closure- especially vein graft, stroke. 2-3% risk of dialysis. 1-2% risk of requiring mechanical ventilation for pulmonary edema. He is willing to undergo temporary mechanical ventilation, and/or dialysis, as necessary, but would like to proceed with the plan for PCI today.    Percutaneous  intervention: See details below  Anticoagulation:  17,00 units heparin Antiplatelet agents: ASA 81 mg + Plavix 75 mg  Total contrast used: 70 cc   Total fluoro time: 27.6 min Air Kerma: 932 mGy  All wires and catheters removed out of the body at the end of the procedure Final angiogram showed no dissection/perforation  Complications: Partial no-reflow      Nigel Mormon, MD [facility] Pager: [phone] Office: [phone] If no answer Cell [phone]     Estimated blood loss <50 mL.  During this procedure the patient was administered the following to achieve and maintain moderate conscious sedation: Versed 4 mg, Fentanyl 100 mcg, while the patient's heart rate, blood pressure, and oxygen saturation were continuously monitored. The period of conscious sedation was 171 minutes, of which I was present face-to-face 100% of this time.  Coronary Findings   Diagnostic  Dominance: Right  Left Main  Ost LM to Mid LM lesion 60% stenosed  Ost LM to Mid LM lesion is 60% stenosed.  Mid LM to Dist LM lesion 60% stenosed  Mid LM to Dist LM lesion is 60% stenosed.  Left Anterior Descending  Ost LAD lesion 100% stenosed  Ost LAD  lesion is 100% stenosed.  Prox LAD to Mid LAD lesion 60% stenosed  Prox LAD to Mid LAD lesion is 60% stenosed. Small caliber  Mid LAD lesion 70% stenosed  Mid LAD lesion is 70% stenosed. Small caliber vessel  Left Circumflex  Prox Cx lesion 99% stenosed with side branch in Ost 1st Mrg 100% stenosed  Prox Cx lesion is 99% stenosed with 100% stenosed side branch in Ost 1st Mrg.  First Obtuse Marginal Branch  1st Mrg lesion 80% stenosed  1st Mrg lesion is 80% stenosed.  Second Obtuse Marginal Branch  Ost 2nd Mrg to 2nd Mrg lesion 90% stenosed  Ost 2nd Mrg to 2nd Mrg lesion is 90% stenosed.  Right Coronary Artery  Prox RCA lesion 100% stenosed  Prox RCA lesion is 100% stenosed.  Mid RCA lesion 60% stenosed  Mid RCA  lesion is 60% stenosed.  Right Posterior Descending Artery  Vessel is small in size. There is moderate disease in the vessel.  Right Posterior Atrioventricular Branch  Vessel is small in size. There is moderate disease in the vessel.  LIMA Graft to Mid LAD  The graft exhibits no disease.  Sequential Graft to 1st Mrg, 2nd Mrg  Sequential Graft to RPDA, 1st RPLB  Graft to 1st Diag  The graft exhibits severe focal disease.  Mid Graft lesion 90% stenosed  Mid Graft lesion is 90% stenosed.  Intervention   Mid Graft lesion (Graft to 1st Diag)  Angioplasty  Balloon angioplasty was performed. Severe focal 90% stenosis. We planned to perform Intervention to SVG to diagonal 1, using embolic protection device spider wire. I successfully cross the lesion using a run-through wire. We had technical difficulties with spider and filter wire, which were eventually resolved and I was able to advance the spider wire over the run-through wire. Distal protection device was parked At the distal end of the vein graft. Surprisingly, no significant stenosis was noted on angiogram after advancing the spider wire. Hypothesis was that this wasn't very thrombotic block that was dislodged by merely advancing the spider wire into the native vessel. Here, I retrieved the spider wire back into the diet. Angiogram showed no reflow in the native diagonal vessel. At this point, I administered a total of 2.4 mg of adenosine, 100 g of sodium nitroprusside, and 600 g of IC nitroglycerin. I also performed focal angioplasty using a 2.0 x 10 mm compliant balloon and the native diagonal vessel. Repeat angiogram showed improvement in the flow with no flow in the distal small diagonal vessel. Given 70 mL use of contrast and relatively acceptable results of 0% lesion in the vein graft and reestablishment of flow in the native diagonal vessel, decided to abort the procedure.  Post-Intervention Lesion Assessment  There is no residual stenosis  post intervention.  Coronary Diagrams   Diagnostic Diagram       Post-Intervention Diagram          Cardiac TAVR CT  TECHNIQUE: The patient was scanned on a Siemens Force 810 slice scanner. A 120 kV retrospective scan was triggered in the ascending thoracic aorta at 140 HU's. Gantry rotation speed was 250 msecs and collimation was .6 mm. No beta blockade or nitro were given. The 3D data set was reconstructed in 5% intervals of the R-R cycle. Systolic and diastolic phases were analyzed on a dedicated work station using MPR, MIP and VRT modes. The patient received 80 cc of contrast.  FINDINGS: Aortic Valve: Tri leaflet Severely calcified with restricted leaflet motion.  Significant annular calcification extending into the inter valvular fibrosa and mitral annular calcification  Aorta: Moderate calcific atherosclerosis of the arch and descending thoracic aorta  Sino-tubular Junction: 2.8 cm  Ascending Thoracic Aorta: 3.3 cm  Aortic Arch: 2.2 cm  Descending Thoracic Aorta: 2.2 cm  Sinus of Valsalva Measurements:  Non-coronary: 29.8 mm  Right - coronary: 29.5 mm  Left -   coronary: 29.8 mm  Coronary Artery Height above Annulus:  Left Main: 11.6 mm above annulus  Right Coronary: 11.3 mm above annulus  Virtual Basal Annulus Measurements:  Maximum / Minimum Diameter: 20.6 mm x 27.2 mm  Perimeter: 77.4 mm  Area: 482 mm2  Coronary Arteries: Sufficient height above annulus for deployment Patent SVG;s to RCA/OM and Diagonal and patent LIMA to LAD  Optimum Fluoroscopic Angle for Delivery: LAO 16 degrees Cranial 4 degrees  IMPRESSION: 1. Tri leaflet severely calcified AV with annular area 482 mm 2 suitable for a 26 mm Sapien 3 valve  2. Severe annular calcification extending into the inter valvular fibrosa increasing risk of peri valvular regurgitation  3.  Patent SVG;s to RCA/OM/Diagonal and patent LIMA to LAD  4.  No LAA  thrombus  5. Optimum angiographic angle for deployment LAO 16 degrees Cranial 4 degrees  Jenkins Rouge  Electronically Signed: By: Jenkins Rouge M.D. On: 11/20/2017 13:46   CT ANGIOGRAPHY CHEST, ABDOMEN AND PELVIS  TECHNIQUE: Multidetector CT imaging through the chest, abdomen and pelvis was performed using the standard protocol during bolus administration of intravenous contrast. Multiplanar reconstructed images and MIPs were obtained and reviewed to evaluate the vascular anatomy.  CONTRAST:  60 mL of Isovue 370.  COMPARISON:  No priors.  FINDINGS: CTA CHEST FINDINGS  Cardiovascular: Heart size is mildly enlarged. There is no significant pericardial fluid, thickening or pericardial calcification. There is aortic atherosclerosis, as well as atherosclerosis of the great vessels of the mediastinum and the coronary arteries, including calcified atherosclerotic plaque in the left main, left anterior descending, left circumflex and right coronary arteries. Status post median sternotomy for CABG including LIMA to the LAD. Severe thickening calcification of the aortic valve. Calcifications of the mitral annulus and mitral-aortic intervalvular fibrosa.  Mediastinum/Lymph Nodes: No pathologically enlarged mediastinal or hilar lymph nodes. Esophagus is unremarkable in appearance. No axillary lymphadenopathy.  Lungs/Pleura: No suspicious appearing pulmonary nodules or masses. No acute consolidative airspace disease. No pleural effusions. Areas of scarring and/or subsegmental atelectasis in the lung bases.  Musculoskeletal/Soft Tissues: Median sternotomy wires. There are no aggressive appearing lytic or blastic lesions noted in the visualized portions of the skeleton.  CTA ABDOMEN AND PELVIS FINDINGS  Hepatobiliary: Subcentimeter low-attenuation lesion in segment 4A of the liver is too small to definitively characterize, but is statistically likely to represent  tiny cyst. No other suspicious appearing hepatic lesions. No intra or extrahepatic biliary ductal dilatation. Tiny partially calcified gallstones lie dependently in the gallbladder. No findings to suggest an acute cholecystitis at this time.  Pancreas: No pancreatic mass. No pancreatic ductal dilatation. No pancreatic or peripancreatic fluid or inflammatory changes.  Spleen: Unremarkable.  Adrenals/Urinary Tract: Several well-defined low-attenuation nonenhancing lesions are noted in the kidneys bilaterally, the largest of which measures 4.3 cm in the upper pole the left kidney. No suspicious renal lesions. Bilateral adrenal glands are normal in appearance. No hydroureteronephrosis. Urinary bladder is normal in appearance.  Stomach/Bowel: Normal appearance of the stomach. No pathologic dilatation of small bowel or colon. Normal appendix.  Vascular/Lymphatic: Aortic atherosclerosis, without evidence of aneurysm or dissection  in the abdominal or pelvic vasculature. Vascular findings and measurements pertinent to potential TAVR procedure, as detailed below. Celiac axis, superior mesenteric artery and inferior mesenteric artery are all widely patent without hemodynamically significant stenosis. Solitary right and 3 left renal arteries all appear patent without definite hemodynamically significant stenoses. No lymphadenopathy noted in the abdomen or pelvis.  Reproductive: Prostate gland and seminal vesicles are unremarkable in appearance.  Other: No significant volume of ascites.  No pneumoperitoneum.  Musculoskeletal: There are no aggressive appearing lytic or blastic lesions noted in the visualized portions of the skeleton.  VASCULAR MEASUREMENTS PERTINENT TO TAVR:  AORTA:  Minimal Aortic Diameter-14 x 15 mm  Severity of Aortic Calcification-moderate  RIGHT PELVIS:  Right Common Iliac Artery -  Minimal Diameter-7.1 x 6.0  mm  Tortuosity-mild  Calcification-moderate  Right External Iliac Artery -  Minimal Diameter-7.5 x 6.9 mm  Tortuosity-severe  Calcification-mild  Right Common Femoral Artery -  Minimal Diameter-6.9 x 6.3 mm  Tortuosity-mild  Calcification-moderate  LEFT PELVIS:  Left Common Iliac Artery -  Minimal Diameter-7.5 x 6.6 mm  Tortuosity-mild  Calcification-moderate  Left External Iliac Artery -  Minimal Diameter-8.3 x 6.5 mm  Tortuosity-severe  Calcification-mild  Left Common Femoral Artery -  Minimal Diameter-7.9 x 6.0 mm  Tortuosity-mild  Calcification-moderate  Review of the MIP images confirms the above findings.  IMPRESSION: 1. Vascular findings and measurements pertinent to potential TAVR procedure, as detailed above. 2. Severe thickening and calcification of the aortic valve, compatible with the reported clinical history of severe aortic stenosis. 3. Aortic atherosclerosis, in addition to left main and 3 vessel coronary artery disease. Status post median sternotomy for CABG including LIMA to the LAD. 4. Cholelithiasis without evidence of acute cholecystitis at this time. 5. Additional incidental findings, as above.  Aortic Atherosclerosis (ICD10-I70.0).   Electronically Signed   By: Vinnie Langton M.D.   On: 11/20/2017 16:04   STS Risk Calculator Procedure: AVR + CAB CALCULATE  Risk of Mortality:  19.322%   Renal Failure:  55.606%   Permanent Stroke:  4.044%   Prolonged Ventilation:  41.384%   DSW Infection:  0.435%   Reoperation:  6.698%   Morbidity or Mortality:  67.402%   Short Length of Stay:  4.607%   Long Length of Stay:  36.020%     Impression:  Patient has stage D severe symptomatic aortic stenosis and severe three-vessel coronary artery disease with vein graft disease status post coronary artery bypass grafting in the remote past.  He presents with slow gradual progression of symptoms of  exertional shortness of breath and chest discomfort culminating in recent hospital admission for acute non-ST segment elevation myocardial infarction complicated by acute on chronic diastolic congestive heart failure.  During his recent hospitalization he underwent PCI without stenting involving patent but diseased vein graft to the diagonal branch.  This procedure was complicated by distal embolization with no reflow and a stent was not placed.  He clinically has done fairly well although he continues to experience exertional chest discomfort and shortness of breath consistent with angina pectoris and chronic diastolic congestive heart failure, New [place] Heart Association functional class III.  I have personally reviewed the patient's recent transthoracic echocardiogram, diagnostic cardiac catheterization and PCI, and CT angiograms.  Echocardiogram confirms the presence of severe aortic stenosis with mild left ventricular systolic dysfunction.  Aortic valve is trileaflet with severe thickening, calcification, and restricted leaflet mobility involving all 3 leaflets.  Peak velocity across aortic valve measured 4 m/s corresponding  to mean transvalvular gradient estimated 36 mmHg and DVI reported 0.26.  Catheterization revealed severe native coronary artery disease but continued patency of left internal mammary artery to the distal left anterior descending coronary artery, saphenous vein graft to the obtuse marginal branch, and saphenous vein graft to the posterior lateral branch of the right coronary artery.  The vein graft to the diagonal branch was diseased and subsequently treated with PCI that was complicated by no reflow.  Risks associated with conventional surgical aortic valve replacement with or without redo coronary artery bypass grafting would be very high.  Given the patient's age and numerous comorbid medical problems I would be very reluctant to consider this patient a candidate for conventional surgery  under any circumstances.  Cardiac-gated CTA of the heart reveals anatomical characteristics consistent with aortic stenosis suitable for treatment by transcatheter aortic valve replacement without any significant complicating features although the patient does have significant annular calcification which might increase the risk for development of paravalvular leak.  CTA of the aorta and iliac vessels demonstrate what appears to be adequate pelvic vascular access to facilitate a transfemoral approach.  The patient also has persistent atrial fibrillation with underlying right bundle branch block which might increase risks that he might need permanent pacemaker placement following TAVR.   Plan:  The patient and his family were counseled at length regarding treatment alternatives for management of severe symptomatic aortic stenosis. Alternative approaches such as conventional aortic valve replacement, transcatheter aortic valve replacement, and palliative medical therapy were compared and contrasted at length.  The risks associated with conventional surgical aortic valve replacement were been discussed in detail, as were expectations for post-operative convalescence, and why I would be reluctant to consider this patient a candidate for conventional surgery.  Issues specific to transcatheter aortic valve replacement were discussed including questions about long term valve durability, the potential for paravalvular leak, possible increased risk of need for permanent pacemaker placement, and other technical complications related to the procedure itself.  Long-term prognosis with medical therapy was discussed. This discussion was placed in the context of the patient's own specific clinical presentation and past medical history.  All of their questions been addressed.  The patient hopes to proceed with transcatheter aortic valve replacement in the near future.  We tentatively plan for surgery on December 03, 2017.     Following the decision to proceed with transcatheter aortic valve replacement, a discussion has been held regarding what types of management strategies would be attempted intraoperatively in the event of life-threatening complications, including whether or not the patient would be considered a candidate for the use of cardiopulmonary bypass and/or conversion to open sternotomy for attempted surgical intervention.  The patient has been advised of a variety of complications that might develop including but not limited to risks of death, stroke, paravalvular leak, aortic dissection or other major vascular complications, aortic annulus rupture, device embolization, cardiac rupture or perforation, mitral regurgitation, acute myocardial infarction, arrhythmia, heart block or bradycardia requiring permanent pacemaker placement, congestive heart failure, respiratory failure, renal failure, pneumonia, infection, other late complications related to structural valve deterioration or migration, or other complications that might ultimately cause a temporary or permanent loss of functional independence or other long term morbidity.  The patient provides full informed consent for the procedure as described and all questions were answered.  The patient has been instructed to stop taking Xarelto 5 days prior to surgery.  He will remain on Plavix and all other medications through the day before  surgery.   I spent in excess of 90 minutes during the conduct of this office consultation and >50% of this time involved direct face-to-face encounter with the patient for counseling and/or coordination of their care.    Valentina Gu. Roxy Manns, MD 11/20/2017 4:56 PM

## 2017-11-20 NOTE — Progress Notes (Addendum)
Pt arrived via [facility] for post procedure bicarb gtt that is already in process, vss, no complaints, eating and talking with staff.  23 IV gtt completed, pt waiting to speak with valve coordinator, Theodosia Quay RN was notified to see pt.

## 2017-11-20 NOTE — Discharge Instructions (Signed)
No further instructions

## 2017-11-20 NOTE — Patient Instructions (Signed)
Stop taking Xarelto on Friday February 1st  Continue taking all other medications without change through the day before surgery.  Have nothing to eat or drink after midnight the night before surgery.  On the morning of surgery you do not need to take any medications

## 2017-11-22 ENCOUNTER — Other Ambulatory Visit: Payer: Medicare Other | Admitting: *Deleted

## 2017-11-22 DIAGNOSIS — I350 Nonrheumatic aortic (valve) stenosis: Secondary | ICD-10-CM

## 2017-11-22 DIAGNOSIS — N289 Disorder of kidney and ureter, unspecified: Secondary | ICD-10-CM

## 2017-11-22 LAB — BASIC METABOLIC PANEL
BUN/Creatinine Ratio: 14 (ref 10–24)
BUN: 41 mg/dL — ABNORMAL HIGH (ref 8–27)
CHLORIDE: 96 mmol/L (ref 96–106)
CO2: 28 mmol/L (ref 20–29)
Calcium: 9.2 mg/dL (ref 8.6–10.2)
Creatinine, Ser: 2.86 mg/dL — ABNORMAL HIGH (ref 0.76–1.27)
GFR calc non Af Amer: 20 mL/min/{1.73_m2} — ABNORMAL LOW (ref >59)
GFR, EST AFRICAN AMERICAN: 23 mL/min/{1.73_m2} — AB (ref >59)
Glucose: 259 mg/dL — ABNORMAL HIGH (ref 65–99)
Potassium: 4.2 mmol/L (ref 3.5–5.2)
Sodium: 141 mmol/L (ref 134–144)

## 2017-11-25 ENCOUNTER — Other Ambulatory Visit: Payer: Self-pay

## 2017-11-25 DIAGNOSIS — I350 Nonrheumatic aortic (valve) stenosis: Secondary | ICD-10-CM

## 2017-11-26 ENCOUNTER — Encounter: Payer: Self-pay | Admitting: Cardiothoracic Surgery

## 2017-11-27 ENCOUNTER — Encounter: Payer: Medicare Other | Admitting: Cardiothoracic Surgery

## 2017-11-28 ENCOUNTER — Ambulatory Visit: Payer: Medicare Other | Attending: Cardiovascular Disease | Admitting: Physical Therapy

## 2017-11-28 ENCOUNTER — Encounter: Payer: Medicare Other | Admitting: Surgery

## 2017-11-28 ENCOUNTER — Other Ambulatory Visit: Payer: Self-pay

## 2017-11-28 ENCOUNTER — Encounter: Payer: Medicare Other | Admitting: Cardiothoracic Surgery

## 2017-11-28 ENCOUNTER — Ambulatory Visit (HOSPITAL_COMMUNITY)
Admission: RE | Admit: 2017-11-28 | Discharge: 2017-11-28 | Disposition: A | Discharge status: Home / Self Care | Payer: Medicare Other | Source: Ambulatory Visit | Attending: Cardiovascular Disease | Admitting: Cardiovascular Disease

## 2017-11-28 ENCOUNTER — Encounter: Payer: Self-pay | Admitting: Physical Therapy

## 2017-11-28 DIAGNOSIS — J449 Chronic obstructive pulmonary disease, unspecified: Secondary | ICD-10-CM | POA: Diagnosis not present

## 2017-11-28 DIAGNOSIS — R2689 Other abnormalities of gait and mobility: Secondary | ICD-10-CM | POA: Insufficient documentation

## 2017-11-28 DIAGNOSIS — I350 Nonrheumatic aortic (valve) stenosis: Secondary | ICD-10-CM

## 2017-11-28 DIAGNOSIS — R293 Abnormal posture: Secondary | ICD-10-CM | POA: Diagnosis present

## 2017-11-28 LAB — PULMONARY FUNCTION TEST
FEF 25-75 PRE: 1.03 L/s
FEF 25-75 Post: 1.55 L/sec
FEF2575-%Change-Post: 50 %
FEF2575-%PRED-POST: 77 %
FEF2575-%Pred-Pre: 51 %
FEV1-%CHANGE-POST: 11 %
FEV1-%Pred-Post: 60 %
FEV1-%Pred-Pre: 54 %
FEV1-Post: 1.72 L
FEV1-Pre: 1.55 L
FEV1FVC-%Change-Post: -1 %
FEV1FVC-%PRED-PRE: 99 %
FEV6-%Change-Post: 13 %
FEV6-%PRED-POST: 66 %
FEV6-%Pred-Pre: 58 %
FEV6-POST: 2.45 L
FEV6-Pre: 2.17 L
FEV6FVC-%PRED-POST: 107 %
FEV6FVC-%Pred-Pre: 107 %
FVC-%CHANGE-POST: 13 %
FVC-%PRED-POST: 61 %
FVC-%Pred-Pre: 54 %
FVC-Post: 2.45 L
FVC-Pre: 2.17 L
PRE FEV6/FVC RATIO: 100 %
Post FEV1/FVC ratio: 70 %
Post FEV6/FVC ratio: 100 %
Pre FEV1/FVC ratio: 72 %
RV % PRED: 152 %
RV: 3.89 L
TLC % PRED: 86 %
TLC: 5.93 L

## 2017-11-28 MED ORDER — ALBUTEROL SULFATE (2.5 MG/3ML) 0.083% IN NEBU
2.5000 mg | INHALATION_SOLUTION | Freq: Once | RESPIRATORY_TRACT | Status: AC
  Administered 2017-11-28: 2.5 mg via RESPIRATORY_TRACT

## 2017-11-28 NOTE — Pre-Procedure Instructions (Signed)
Cameron [name]  11/28/2017      [facility] [address] [place] [address] [place] [place] [address] Phone: [phone] Fax: [phone]  [facility], [place]. [place] [address] Phone: [phone] Fax: [phone]    Your procedure is scheduled on 12/03/2017.  Report to [facility] Admitting at 12:30 P.M.  Call this number if you have problems the morning of surgery:  [phone]   Remember:  Do not eat food or drink liquids after midnight.  Take these medicines the morning of surgery with A SIP OF WATER: NONE  7 days prior to surgery STOP taking any Aspirin(unless otherwise instructed by your surgeon), Aleve, Naproxen, Ibuprofen, Motrin, Advil, Goody's, BC's, all herbal medications, fish oil, and all vitamins  STOP taking your XARELTO on Friday 11/29/2017   WHAT DO I DO ABOUT MY DIABETES MEDICATION? . THE NIGHT BEFORE SURGERY, take ___________ units of ___________insulin.     . THE MORNING OF SURGERY, take _____________ units of __________insulin.   How to Manage Your Diabetes Before and After Surgery  Why is it important to control my blood sugar before and after surgery? . Improving blood sugar levels before and after surgery helps healing and can limit problems. . A way of improving blood sugar control is eating a healthy diet by: o  Eating less sugar and carbohydrates o  Increasing activity/exercise o  Talking with your doctor about reaching your blood sugar goals . High blood sugars (greater than 180 mg/dL) can raise your risk of infections and slow your recovery, so you will need to focus on controlling your diabetes during the weeks before surgery. . Make sure that the doctor who takes care of your diabetes knows about your planned surgery including the date and location.  How do I manage my blood sugar before surgery? . Check  your blood sugar at least 4 times a day, starting 2 days before surgery, to make sure that the level is not too high or low. o Check your blood sugar the morning of your surgery when you wake up and every 2 hours until you get to the Short Stay unit. . If your blood sugar is less than 70 mg/dL, you will need to treat for low blood sugar: o Do not take insulin. o Treat a low blood sugar (less than 70 mg/dL) with  cup of clear juice (cranberry or apple), 4 glucose tablets, OR glucose gel. Recheck blood sugar in 15 minutes after treatment (to make sure it is greater than 70 mg/dL). If your blood sugar is not greater than 70 mg/dL on recheck, call [phone] o  for further instructions. . Report your blood sugar to the short stay nurse when you get to Short Stay.  . If you are admitted to the hospital after surgery: o Your blood sugar will be checked by the staff and you will probably be given insulin after surgery (instead of oral diabetes medicines) to make sure you have good blood sugar levels. o The goal for blood sugar control after surgery is 80-180 mg/dL.    Do not wear jewelry.  Do not wear lotions, powders, or colognes, or deodorant.  Men may shave face and neck.  Do not bring valuables to the hospital.  [facility] is not responsible for any belongings or valuables.  Hearing aids, eyeglasses, contacts, dentures or bridgework may not be worn into surgery.  Leave  your suitcase in the car.  After surgery it may be brought to your room.  For patients admitted to the hospital, discharge time will be determined by your treatment team.  Patients discharged the day of surgery will not be allowed to drive home.   Name and phone number of your driver:    Special instructions:   Ord- Preparing For Surgery  Before surgery, you can play an important role. Because skin is not sterile, your skin needs to be as free of germs as possible. You can reduce the number of germs on your skin  by washing with CHG (chlorahexidine gluconate) Soap before surgery.  CHG is an antiseptic cleaner which kills germs and bonds with the skin to continue killing germs even after washing.  Please do not use if you have an allergy to CHG or antibacterial soaps. If your skin becomes reddened/irritated stop using the CHG.  Do not shave (including legs and underarms) for at least 48 hours prior to first CHG shower. It is OK to shave your face.  Please follow these instructions carefully.   1. Shower the NIGHT BEFORE SURGERY and the MORNING OF SURGERY with CHG.   2. If you chose to wash your hair, wash your hair first as usual with your normal shampoo.  3. After you shampoo, rinse your hair and body thoroughly to remove the shampoo.  4. Use CHG as you would any other liquid soap. You can apply CHG directly to the skin and wash gently with a scrungie or a clean washcloth.   5. Apply the CHG Soap to your body ONLY FROM THE NECK DOWN.  Do not use on open wounds or open sores. Avoid contact with your eyes, ears, mouth and genitals (private parts). Wash Face and genitals (private parts)  with your normal soap.  6. Wash thoroughly, paying special attention to the area where your surgery will be performed.  7. Thoroughly rinse your body with warm water from the neck down.  8. DO NOT shower/wash with your normal soap after using and rinsing off the CHG Soap.  9. Pat yourself dry with a CLEAN TOWEL.  10. Wear CLEAN PAJAMAS to bed the night before surgery, wear comfortable clothes the morning of surgery  11. Place CLEAN SHEETS on your bed the night of your first shower and DO NOT SLEEP WITH PETS.    Day of Surgery: Shower as stated above. Do not apply any deodorants/lotions.  Please wear clean clothes to the hospital/surgery center.      Please read over the following fact sheets that you were given.

## 2017-11-28 NOTE — Therapy (Signed)
[place], [place], [address] Phone: [phone]   Fax:  [phone]  Physical Therapy Evaluation  Patient Details  Name: Cameron [name] MRN: [id number] Date of Birth: [birth year] Referring Provider: Dr. Sherren Mocha   Encounter Date: 11/28/2017  PT End of Session - 11/28/17 1118    Visit Number  1    PT Start Time  1115    PT Stop Time  1155    PT Time Calculation (min)  40 min       Past Medical History:  Diagnosis Date  . Atrial fibrillation ([place])   . CAD (coronary artery disease)    a. 2002: s/p CABG x 4V (LIMA-LAD, seq SVG-PDA, PLA, seq SVG-OM1, OM2, SVG-Diag1)  . CKD (chronic kidney disease)   . COPD (chronic obstructive pulmonary disease) ([place])   . Diabetes mellitus without complication ([place])   . HLD (hyperlipidemia)   . Hypertension   . Obesity   . Severe aortic stenosis     Past Surgical History:  Procedure Laterality Date  . CARDIAC SURGERY    . CORONARY BALLOON ANGIOPLASTY N/A 09/23/2017   Procedure: CORONARY BALLOON ANGIOPLASTY;  Surgeon: Nigel Mormon, MD;  Location: [facility];  Service: Cardiovascular;  Laterality: N/A;  . FINGER AMPUTATION    . HERNIA REPAIR    . open heart surgery    . RIGHT HEART CATH AND CORONARY/GRAFT ANGIOGRAPHY N/A 09/17/2017   Procedure: RIGHT HEART CATH AND CORONARY/GRAFT ANGIOGRAPHY;  Surgeon: Nigel Mormon, MD;  Location: [facility];  Service: Cardiovascular;  Laterality: N/A;  . TOE AMPUTATION    . ULTRASOUND GUIDANCE FOR VASCULAR ACCESS  09/17/2017   Procedure: Ultrasound Guidance For Vascular Access;  Surgeon: Nigel Mormon, MD;  Location: [facility];  Service: Cardiovascular;;    There were no vitals filed for this visit.   Subjective Assessment - 11/28/17 1119    Subjective  reports chest discomfort, shortness of breath & fatigue with activity limiting his ability to walk to the mailbox    Patient  Stated Goals  to fix heart and get back to the beach    Currently in Pain?  No/denies         [facility] PT Assessment - 11/28/17 0001      Assessment   Medical Diagnosis  severe aortic stenosis    Referring Provider  Dr. Sherren Mocha    Onset Date/Surgical Date  07/01/17 approximate      Precautions   Precautions  None      Restrictions   Weight Bearing Restrictions  No      Balance Screen   Has the patient fallen in the past 6 months  No    Has the patient had a decrease in activity level because of a fear of falling?   No    Is the patient reluctant to leave their home because of a fear of falling?   No      Home Environment   Living Environment  Private residence    Home Access  Stairs to enter    Entrance Stairs-Number of Steps  [place]  One level      Prior Function   Level of Independence  Independent with community mobility without device      Posture/Postural Control   Posture/Postural Control  Postural limitations    Postural Limitations  Rounded Shoulders;Forward head      ROM /  Strength   AROM / PROM / Strength  AROM;Strength      AROM   Overall AROM Comments  grossly WNL      Strength   Overall Strength Comments  grossly 4 to 5/5 throughout    Strength Assessment Site  Hand    Right/Left hand  Right;Left    Right Hand Grip (lbs)  60 R hand dominant    Left Hand Grip (lbs)  65      Ambulation/Gait   Gait Comments  Gait distance is limited by 47% for age/gender.        [facility] Pre-Surgical Assessment - 11/28/17 0001    5 Meter Walk Test- trial 1  6 sec    5 Meter Walk Test- trial 2  6 sec.     5 Meter Walk Test- trial 3  6 sec.    5 meter walk test average  6 sec    4 Stage Balance Test tolerated for:   10 sec.    4 Stage Balance Test Position  4    Sit To Stand Test- trial 1  14 sec.    ADL/IADL Independent with:  Bathing;Meal prep;Dressing;Finances    ADL/IADL Needs Assistance with:  Yard work    ADL/IADL Fraility Index  Vulnerable     6 Minute Walk- Baseline  yes    BP (mmHg)  162/83    HR (bpm)  73    02 Sat (%RA)  96 %    Modified Borg Scale for Dyspnea  0- Nothing at all    Perceived Rate of Exertion (Borg)  6-    6 Minute Walk Post Test  yes    BP (mmHg)  182/93  (Abnormal)     HR (bpm)  108    02 Sat (%RA)  89 %    Modified Borg Scale for Dyspnea  3- Moderate shortness of breath or breathing difficulty    Perceived Rate of Exertion (Borg)  9- very light    Aerobic Endurance Distance Walked  910           Objective measurements completed on examination: See above findings.                           Plan - 11/28/17 1455    Clinical Impression Statement  see below    PT Frequency  One time visit      Clinical Impression Statement: Pt is a 77 yo male presenting to OP PT for evaluation prior to possible TAVR surgery due to severe aortic stenosis. Pt reports onset of chest discomfort, shortness of breath & fatigue with activity which recently worsened around Labor Day 2018. Symptoms are limiting ability to walk his mailbox. Pt presents with good ROM and strength, good balance and is not at high fall risk 4 stage balance test, good walking speed and poor aerobic endurance per 6 minute walk test. Pt ambulated a total of 910 feet in 6 minute walk. BP increased significantly and oxygen dropped to 89% with 6 minute walk test. Based on the Short Physical Performance Battery, patient has a frailty rating of 10/12 with </= 5/12 considered frail.   Patient demonstrated the following deficits and impairments:     Visit Diagnosis: Other abnormalities of gait and mobility  Abnormal posture     Problem List Patient Active Problem List   Diagnosis Date Noted  . Diabetes mellitus type 2, uncontrolled ([place]) 11/20/2017    [place],  PT 11/28/2017, 2:58 PM  [facility] [address] [place], [place], [address] Phone: [phone]   Fax:   [phone]  Name: Cameron [name] MRN: [id number] Date of Birth: [birth year]

## 2017-11-29 ENCOUNTER — Other Ambulatory Visit: Payer: Self-pay

## 2017-11-29 ENCOUNTER — Encounter (HOSPITAL_COMMUNITY)
Admission: RE | Admit: 2017-11-29 | Discharge: 2017-11-29 | Disposition: A | Discharge status: Home / Self Care | Payer: Medicare Other | Source: Ambulatory Visit | Attending: Cardiovascular Disease | Admitting: Cardiovascular Disease

## 2017-11-29 ENCOUNTER — Encounter (HOSPITAL_COMMUNITY): Payer: Self-pay

## 2017-11-29 ENCOUNTER — Ambulatory Visit (HOSPITAL_COMMUNITY)
Admission: RE | Admit: 2017-11-29 | Discharge: 2017-11-29 | Disposition: A | Discharge status: Home / Self Care | Payer: Medicare Other | Source: Ambulatory Visit | Attending: Cardiovascular Disease | Admitting: Cardiovascular Disease

## 2017-11-29 ENCOUNTER — Telehealth: Payer: Self-pay | Admitting: Physician Assistant

## 2017-11-29 ENCOUNTER — Encounter: Payer: Self-pay | Admitting: Cardiothoracic Surgery

## 2017-11-29 ENCOUNTER — Institutional Professional Consult (permissible substitution): Payer: Medicare Other | Admitting: Cardiothoracic Surgery

## 2017-11-29 VITALS — BP 146/70 | HR 80 | Resp 20 | Ht 69.0 in | Wt 213.0 lb

## 2017-11-29 DIAGNOSIS — Z01812 Encounter for preprocedural laboratory examination: Secondary | ICD-10-CM | POA: Diagnosis present

## 2017-11-29 DIAGNOSIS — Z951 Presence of aortocoronary bypass graft: Secondary | ICD-10-CM | POA: Insufficient documentation

## 2017-11-29 DIAGNOSIS — I2510 Atherosclerotic heart disease of native coronary artery without angina pectoris: Secondary | ICD-10-CM | POA: Insufficient documentation

## 2017-11-29 DIAGNOSIS — Z6831 Body mass index (BMI) 31.0-31.9, adult: Secondary | ICD-10-CM | POA: Insufficient documentation

## 2017-11-29 DIAGNOSIS — I6529 Occlusion and stenosis of unspecified carotid artery: Secondary | ICD-10-CM | POA: Diagnosis not present

## 2017-11-29 DIAGNOSIS — R9431 Abnormal electrocardiogram [ECG] [EKG]: Secondary | ICD-10-CM | POA: Insufficient documentation

## 2017-11-29 DIAGNOSIS — Z01810 Encounter for preprocedural cardiovascular examination: Secondary | ICD-10-CM | POA: Insufficient documentation

## 2017-11-29 DIAGNOSIS — J984 Other disorders of lung: Secondary | ICD-10-CM | POA: Insufficient documentation

## 2017-11-29 DIAGNOSIS — N281 Cyst of kidney, acquired: Secondary | ICD-10-CM | POA: Diagnosis not present

## 2017-11-29 DIAGNOSIS — I129 Hypertensive chronic kidney disease with stage 1 through stage 4 chronic kidney disease, or unspecified chronic kidney disease: Secondary | ICD-10-CM | POA: Insufficient documentation

## 2017-11-29 DIAGNOSIS — I350 Nonrheumatic aortic (valve) stenosis: Secondary | ICD-10-CM | POA: Diagnosis not present

## 2017-11-29 DIAGNOSIS — N184 Chronic kidney disease, stage 4 (severe): Secondary | ICD-10-CM | POA: Diagnosis not present

## 2017-11-29 DIAGNOSIS — I4891 Unspecified atrial fibrillation: Secondary | ICD-10-CM | POA: Insufficient documentation

## 2017-11-29 DIAGNOSIS — E1122 Type 2 diabetes mellitus with diabetic chronic kidney disease: Secondary | ICD-10-CM | POA: Insufficient documentation

## 2017-11-29 DIAGNOSIS — E669 Obesity, unspecified: Secondary | ICD-10-CM | POA: Insufficient documentation

## 2017-11-29 DIAGNOSIS — Z87891 Personal history of nicotine dependence: Secondary | ICD-10-CM | POA: Insufficient documentation

## 2017-11-29 DIAGNOSIS — J449 Chronic obstructive pulmonary disease, unspecified: Secondary | ICD-10-CM | POA: Diagnosis not present

## 2017-11-29 DIAGNOSIS — I4510 Unspecified right bundle-branch block: Secondary | ICD-10-CM | POA: Insufficient documentation

## 2017-11-29 LAB — TYPE AND SCREEN
ABO/RH(D): A POS
Antibody Screen: NEGATIVE

## 2017-11-29 LAB — BLOOD GAS, ARTERIAL
Acid-Base Excess: 4.2 mmol/L — ABNORMAL HIGH (ref 0.0–2.0)
BICARBONATE: 28.4 mmol/L — AB (ref 20.0–28.0)
DRAWN BY: 421801
FIO2: 21
O2 SAT: 98.7 %
PATIENT TEMPERATURE: 98.6
PO2 ART: 119 mmHg — AB (ref 83.0–108.0)
pCO2 arterial: 43.7 mmHg (ref 32.0–48.0)
pH, Arterial: 7.428 (ref 7.350–7.450)

## 2017-11-29 LAB — COMPREHENSIVE METABOLIC PANEL
ALBUMIN: 3.6 g/dL (ref 3.5–5.0)
ALT: 18 U/L (ref 17–63)
ANION GAP: 16 — AB (ref 5–15)
AST: 20 U/L (ref 15–41)
Alkaline Phosphatase: 77 U/L (ref 38–126)
BUN: 41 mg/dL — ABNORMAL HIGH (ref 6–20)
CHLORIDE: 95 mmol/L — AB (ref 101–111)
CO2: 23 mmol/L (ref 22–32)
Calcium: 8.7 mg/dL — ABNORMAL LOW (ref 8.9–10.3)
Creatinine, Ser: 2.85 mg/dL — ABNORMAL HIGH (ref 0.61–1.24)
GFR calc Af Amer: 23 mL/min — ABNORMAL LOW (ref >60)
GFR calc non Af Amer: 20 mL/min — ABNORMAL LOW (ref >60)
Glucose, Bld: 533 mg/dL (ref 65–99)
POTASSIUM: 4.2 mmol/L (ref 3.5–5.1)
SODIUM: 134 mmol/L — AB (ref 135–145)
TOTAL PROTEIN: 7 g/dL (ref 6.5–8.1)
Total Bilirubin: 0.8 mg/dL (ref 0.3–1.2)

## 2017-11-29 LAB — PROTIME-INR
INR: 1.35
PROTHROMBIN TIME: 16.5 s — AB (ref 11.4–15.2)

## 2017-11-29 LAB — URINALYSIS, ROUTINE W REFLEX MICROSCOPIC
Bilirubin Urine: NEGATIVE
KETONES UR: NEGATIVE mg/dL
Leukocytes, UA: NEGATIVE
NITRITE: NEGATIVE
PROTEIN: NEGATIVE mg/dL
Specific Gravity, Urine: 1.015 (ref 1.005–1.030)
pH: 7 (ref 5.0–8.0)

## 2017-11-29 LAB — URINALYSIS, MICROSCOPIC (REFLEX): WBC, UA: NONE SEEN WBC/hpf (ref 0–5)

## 2017-11-29 LAB — HEMOGLOBIN A1C
Hgb A1c MFr Bld: 8.6 % — ABNORMAL HIGH (ref 4.8–5.6)
Mean Plasma Glucose: 200.12 mg/dL

## 2017-11-29 LAB — GLUCOSE, CAPILLARY: Glucose-Capillary: 439 mg/dL — ABNORMAL HIGH (ref 65–99)

## 2017-11-29 LAB — CBC
HCT: 44.8 % (ref 39.0–52.0)
Hemoglobin: 14.7 g/dL (ref 13.0–17.0)
MCH: 27.6 pg (ref 26.0–34.0)
MCHC: 32.8 g/dL (ref 30.0–36.0)
MCV: 84.2 fL (ref 78.0–100.0)
PLATELETS: 140 10*3/uL — AB (ref 150–400)
RBC: 5.32 MIL/uL (ref 4.22–5.81)
RDW: 14.3 % (ref 11.5–15.5)
WBC: 7.7 10*3/uL (ref 4.0–10.5)

## 2017-11-29 LAB — ABO/RH: ABO/RH(D): A POS

## 2017-11-29 LAB — APTT: aPTT: 38 seconds — ABNORMAL HIGH (ref 24–36)

## 2017-11-29 LAB — SURGICAL PCR SCREEN
MRSA, PCR: POSITIVE — AB
STAPHYLOCOCCUS AUREUS: POSITIVE — AB

## 2017-11-29 LAB — BRAIN NATRIURETIC PEPTIDE: B NATRIURETIC PEPTIDE 5: 175.8 pg/mL — AB (ref 0.0–100.0)

## 2017-11-29 NOTE — Progress Notes (Addendum)
Anesthesia Chart Review: Patient is a 77 year old male scheduled for TAVR, transfemoral approach on 12/03/17 by Dr. Sherren Mocha on 12/03/17.   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 BMI is consistent with obesity.   - PCP is Dr. Orpah Melter (per PAT notes).  - Cardiologist is with [facility]. He has seen  Dr. Adrian Prows, and Dr. Vernell Leep.  - Nephrologist is with [facility]. He was seen by Dr. Pearson Grippe for initial consult 09/17/17 (after having not been seen in the office for several years). Prior to patient undergoing 09/23/17 LHC, Dr. Virgina Jock wrote that he had discussed with Dr. Florene Glen and "In his opinion, patient will likely need dialysis eventually, after PCI and/or after TAVR. 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 Roxy Manns instructed to hold 5 days prior to surgery), Plavix (continue per Dr. Roxy Manns), amlodipine, Celexa, Lasix, insulin NPH 60 Units BID, insulin regular 40 Units BID, Imdur, Toprol XL, Crestor.    BP (!) 155/56   Pulse 76   Temp 36.6 C   Resp 18   Ht 5\' 9"  (1.753 m)   Wt 213 lb 3.2 oz (96.7 kg)   SpO2 96%   BMI 31.48 kg/m   EKG 11/29/17: NSR, LAD, RBBB, septal infarct (age undetermined), inferior infarct (age undetermined), lateral ischemia.   Cardiac CT 11/20/17: IMPRESSION: 1. Tri leaflet severely calcified AV with annular area 482 mm 2 suitable for a 26 mm Sapien 3 valve 2. Severe annular calcification extending into the inter valvular fibrosa increasing risk of peri valvular regurgitation 3.  Patent SVG;s to RCA/OM/Diagonal and patent LIMA to LAD 4.  No LAA thrombus 5.  Optimum angiographic angle for deployment LAO 16 degrees Cranial 4 degrees  Cardiac cath 09/23/17:  Mid Graft lesion is 90% stenosed.  Balloon angioplasty was performed.  Post intervention, there is a 0% residual stenosis. Severe native vessel CAD Patent grafts 4/4 as noticed on cath 11/20 (LIMA-LAD, seq SVG-PDA, PLA, seq SVG-OM1, OM2, SVG-Diag1) Severe focal 90% stenosis SVG-Diag1 Successful dottering of SVG 90% stenosis. Focal angioplasty in native diagonal vessel. Distal embolization and no-reflow in native diagonal vessel Partial restablishement of flow in native diagonal vessel/ Recommendation: Plavix 75 mg at least for 1 year.  Continue aggrastat bolus and infusion for 18 hrs. Continue ASA Discharge home on plavix and xarelto  Echo 09/15/17: Study Conclusions - Left ventricle: Wall thickness was increased in a pattern of   severe LVH. There was concentric hypertrophy. The estimated   ejection fraction was in the range of 55% to 60%. Septal wall   dyskinesis. Doppler parameters are consistent with grade II   diastolic dysfunction, elevated LAP. - Aortic valve: Severely calcified annulus. Severely calcified   leaflets. Cusp separation was reduced. There was severe stenosis.   Valve area (Vmax): 0.9 cm^2. Vmax 4.1 cm, Mean PG 41 mmHg. - Mitral valve: Moderately calcified annulus. Moderately calcified   leaflets with restricted movement of posterior mitral valve   leaflet. The findings are consistent with trivial stenosis. - Left atrium: The atrium was moderately dilated. - Right ventricle: The cavity size was mildly dilated. - Right atrium: The atrium was mildly dilated.  Carotid U/S 09/23/17: Final  Interpretation: - Right Carotid: There is evidence in the right ICA of a 60-79% stenosis. Low end of scale. - Left Carotid: There is evidence in the left ICA of a 40-59% stenosis. Low end of scale. - Vertebrals: Both vertebral arteries were patent with antegrade flow.  CTA  chest/abd/pelvis 11/20/17: IMPRESSION: 1. Vascular findings and measurements pertinent to potential TAVR procedure, as detailed above. 2. Severe thickening and calcification of the aortic valve, compatible with the reported clinical history of severe aortic stenosis. 3. Aortic atherosclerosis, in addition to left main and 3 vessel coronary artery disease. Status post median sternotomy for CABG including LIMA to the LAD. 4. Cholelithiasis without evidence of acute cholecystitis at this time.  CXR 11/29/17: IMPRESSION: Borderline heart size. Scarring in the lung bases. No active disease.  PFTs 11/28/17: FVC 2.75 (67%), FEV1 1.90 (64%), DLCO unc 24.55 (79%).  Renal U/S 09/17/17: IMPRESSION: 1.  No hydronephrosis. 2. Mild renal cortical thinning bilaterally. 3. Bilateral renal lesions. Most likely cysts and complex cysts. Upper pole renal lesions bilaterally are indeterminate. Consider outpatient pre and post contrast abdominal MRI or CT.  Preoperative labs noted. Na 134, K 4.2, CO2 23, glucose 533, A1c 8.6, BUN 41, Cr 2.85 (Cr 2.86 11/22/17; range 2.25-3.00 since 09/24/17), Ca 8.7, anion gap 16. H/H 14.7/44.8, PLT 140K. PT 16.5, INR 1.35. PTT 38. UA negative for leukocytes, nitrites, but positive for glucose. He reportedly did not take his usual insulin this morning.  I notified Levonne Spiller, RN at [facility] (at 4:25 PM) of glucose 533, anion gap 16, CO2 of 23. Patient was seen by Dr. Tharon Aquas Trigt this afternoon after his PAT appointment as second TAVR evaluation. She will discuss results with either [facility] surgeon or cardiology today. Will request most recent cardiology and nephrology records.  Cameron [name] [facility] Short Stay Center/Anesthesiology Phone [phone] 11/29/2017 4:53 PM   Addendum: Last seen by Dr. Joelyn Oms on 11/04/17 for follow-up CKD stage III. He was aware of upcoming TAVR. He reviewed labs done around that appointment and felt SCr around 2.5 was stable and no changes  needed.   Last seen by Dr. Vernell Leep on 11/27/17. Last Xarelto 11/29/17.  Although patient was severely hyperglycemic according to PAT labs, notes indicate that he had not taken his insulin yet that day because he had left it at home. His labs were taken shortly after eating fried fish and french fries for lunch. He reports home glucose readings generally < 200. His A1c is consistent with an average glucose of 200. He is to notify [name] if CBGs remain elevated over the weekend. He will get a fasting CBG on the day of surgery.   Cameron [name] [facility] Short Stay Center/Anesthesiology Phone [phone] 12/02/2017 1:19 PM

## 2017-11-29 NOTE — Progress Notes (Addendum)
PCP is Parke Poisson, MD Referring Provider is Nigel Mormon, MD  Chief Complaint  Patient presents with  . Aortic Stenosis    2nd TAVR eval, review all studies, surgery scheduled for 12/03/2017   Opinion regarding transcatheter aVR HPI: Patient examined, most recent echocardiogram, CTA and coronary angiograms personally reviewed and counseled with patient.  77 year old hypertensive, diabetic male with severe aortic stenosis, symptomatic with chest pain on exertion and with bending over. No syncope. No symptoms of failure. The patient is status post multivessel CABG 2002 and has preserved systolic LV function. Cardiac catheterization shows patent grafts to the LAD, circumflex, and right coronary artery. A mid vein stenosis to a diagonal branch of the LAD had attempted PTCA which resulted in graft closure December 2018.  The patient has reproducible chest pain almost daily when he goes to his mailbox or bends over to tie his shoes. He has a heavily calcified aortic valve by echo which is severely restricted with a valve area of 0.9.  With previous bypass surgery, atrial fibrillation, and age 44 he would be very high risk for redo sternotomy and open aVR. The patient is very appropriate candidate for transcatheter aVR treat his severe aortic stenosis.  Past Medical History:  Diagnosis Date  . Atrial fibrillation ([place])   . CAD (coronary artery disease)    a. 2002: s/p CABG x 4V (LIMA-LAD, seq SVG-PDA, PLA, seq SVG-OM1, OM2, SVG-Diag1)  . CKD (chronic kidney disease)   . COPD (chronic obstructive pulmonary disease) ([place])   . Diabetes mellitus without complication ([place])   . HLD (hyperlipidemia)   . Hypertension   . Myocardial infarction ([place]) 08/2017   per patient report  . Obesity   . Severe aortic stenosis     Past Surgical History:  Procedure Laterality Date  . CARDIAC SURGERY    . CORONARY BALLOON ANGIOPLASTY N/A 09/23/2017   Procedure: CORONARY BALLOON ANGIOPLASTY;   Surgeon: Nigel Mormon, MD;  Location: [facility];  Service: Cardiovascular;  Laterality: N/A;  . FINGER AMPUTATION    . HERNIA REPAIR    . open heart surgery    . RIGHT HEART CATH AND CORONARY/GRAFT ANGIOGRAPHY N/A 09/17/2017   Procedure: RIGHT HEART CATH AND CORONARY/GRAFT ANGIOGRAPHY;  Surgeon: Nigel Mormon, MD;  Location: [facility];  Service: Cardiovascular;  Laterality: N/A;  . TOE AMPUTATION    . ULTRASOUND GUIDANCE FOR VASCULAR ACCESS  09/17/2017   Procedure: Ultrasound Guidance For Vascular Access;  Surgeon: Nigel Mormon, MD;  Location: [facility];  Service: Cardiovascular;;    Family History  Problem Relation Age of Onset  . Heart disease Father   . Stomach cancer Paternal Grandfather   . Lung cancer Maternal Grandmother        smoked    Social History Social History   Tobacco Use  . Smoking status: Former Smoker    Packs/day: 2.00    Years: 20.00    Pack years: 40.00    Types: Cigarettes    Last attempt to quit: 09/28/1978    Years since quitting: 39.1  . Smokeless tobacco: Never Used  Substance Use Topics  . Alcohol use: No  . Drug use: No    Current Outpatient Medications  Medication Sig Dispense Refill  . amLODipine (NORVASC) 5 MG tablet Take 1 tablet (5 mg total) by mouth daily. 30 tablet 0  . citalopram (CELEXA) 10 MG tablet Take 1 tablet (10 mg total) by mouth daily. 60 tablet  3  . clopidogrel (PLAVIX) 75 MG tablet Take 1 tablet (75 mg total) by mouth daily. 60 tablet 3  . furosemide (LASIX) 40 MG tablet Take 40 mg by mouth 2 (two) times daily.     . insulin NPH (HUMULIN N,NOVOLIN N) 100 UNIT/ML injection Inject 60 Units into the skin See admin instructions. twice daily - after lunch and supper    . insulin regular (NOVOLIN R,HUMULIN R) 100 units/mL injection Inject 40 Units into the skin 2 (two) times daily. after lunch and supper    . isosorbide mononitrate (IMDUR) 60 MG 24 hr tablet Take 60-120 mg by mouth 2  (two) times daily. Take 2 tablets (120 mg) by mouth every morning & take 1 tablet (60 mg) by mouth in the evening.    . metoprolol succinate (TOPROL-XL) 100 MG 24 hr tablet Take 1 tablet (100 mg total) by mouth daily after supper. Take with or immediately following a meal. 60 tablet 3  . nitroGLYCERIN (NITROSTAT) 0.4 MG SL tablet Place 0.4 mg every 5 (five) minutes as needed under the tongue for chest pain.    . Rivaroxaban (XARELTO) 15 MG TABS tablet Take 1 tablet (15 mg total) by mouth daily with supper. 60 tablet 3  . rosuvastatin (CRESTOR) 40 MG tablet Take 40 mg daily after supper by mouth.      No current facility-administered medications for this visit.     Allergies  Allergen Reactions  . Cefuroxime Diarrhea  . Ezetimibe Diarrhea  . Pravastatin Other (See Comments)    myalgias    Review of Systems  Weight stable No fever No recent symptoms of upper respiratory infection No syncope or change in vision No difficulty swallowing or dental complaints No change in bowel habits or abdominal pain No lower extremity edema or tenderness  BP (!) 146/70   Pulse 80   Resp 20   Ht 5\' 9"  (1.753 m)   Wt 213 lb (96.6 kg)   SpO2 97% Comment: RA  BMI 31.45 kg/m  Physical Exam      Exam    General- alert and comfortable    Neck- no JVD, no cervical adenopathy palpable, no carotid bruit   Lungs- clear without rales, wheezes. Well-healed sternal incision.   Cor- irregular rhythm of atrial fib, 3/6 systolic ejection murmur of aortic stenosis , no gallop   Abdomen- soft, non-tender, obese   Extremities - warm, non-tender, minimal edema   Neuro- oriented, appropriate, no focal weakness  Diagnostic Tests: Echocardiogram-severe aortic stenosis, LVH with preserved systolic function Coronary angiogram, patent left IMA to LAD, patent vein graft to OM1 and OM 2, patent vein graft to posterior descending CTA of chest-minimal thoracic aortic calcification, plaque Impression: Patient would  benefit from transcatheter aortic valve replacement for his severe aortic stenosis with improved survival improved symptoms and quality of life. Open surgical AVR would be very high risk with high morbidity and mortality.  Plan: Recommend patient proceed with transcatheter aortic valve replacement which is scheduled for next week.Preoperative blood sugar at preoperative visit is extremely elevated and needs to be the patient be contacted and a medical follow-up the day before surgery will be arranged.   Len Childs, MD [facility] [phone] look as he is a using use gauze $250 100boxes she'll also thousand 40 L5 years last 9 diabetics

## 2017-11-29 NOTE — Progress Notes (Signed)
Cameron [name]            11/28/2017                          [facility] [address], [place] [address] [place] [place] [address] Phone: [phone] Fax: [phone]  [facility], [place]. [place] [address] Phone: [phone] Fax: [phone]              Your procedure is scheduled on 12/03/2017.            Report to [facility] Admitting at 12:30 PM.            Call this number if you have problems the morning of surgery:            [phone]             Remember:            Do not eat food or drink liquids after midnight.            Take these medicines the morning of surgery with A SIP OF WATER: NONE  7 days prior to surgery STOP taking any Aspirin(unless otherwise instructed by your surgeon), Aleve, Naproxen, Ibuprofen, Motrin, Advil, Goody's, BC's, all herbal medications, fish oil, and all vitamins  STOP taking your XARELTO on Friday 11/29/2017   WHAT DO I DO ABOUT MY DIABETES MEDICATION?  THE NIGHT BEFORE SURGERY, take 20 units of NPH (humulin) insulin (1/2 normal dose) and 40 units of Novolin R (regular insulin).                                         THE MORNING OF SURGERY, take 20 units of NPH (humulin) insulin (1/2 normal dose) and no Novolin R (regular insulin) unless yourCBG is great than 220 mg/dl, then you may take 1/2 of usual correction dose (Novolin R).   HOW TO MANAGE YOUR DIABETES BEFORE AND AFTER SURGERY  Why is it important to control my blood sugar before and after surgery?  Improving blood sugar levels before and after surgery helps healing and can limit problems.  A way of improving blood sugar control is eating a healthy diet by: ?  Eating less sugar and carbohydrates ?  Increasing activity/exercise ?  Talking with your doctor about reaching your blood sugar goals  High blood sugars (greater than  180 mg/dL) can raise your risk of infections and slow your recovery, so you will need to focus on controlling your diabetes during the weeks before surgery.  Make sure that the doctor who takes care of your diabetes knows about your planned surgery including the date and location.  How do I manage my blood sugar before surgery?  Check your blood sugar at least 4 times a day, starting 2 days before surgery, to make sure that the level is not too high or low. ? Check your blood sugar the morning of your surgery when you wake up and every 2 hours until you get to the Short Stay unit.  If your blood sugar is less than 70 mg/dL, you will need to treat for low blood sugar: ? Do not take insulin. ? Treat a low blood sugar (less than 70 mg/dL) with  cup of clear juice (cranberry  or apple), 4glucose tablets, OR glucose gel. Recheck blood sugar in 15 minutes after treatment (to make sure it is greater than 70 mg/dL). If your blood sugar is not greater than 70 mg/dL on recheck, call [phone] ?  for further instructions.  Report your blood sugar to the short stay nurse when you get to Short Stay.   If you are admitted to the hospital after surgery: ? Your blood sugar will be checked by the staff and you will probably be given insulin after surgery (instead of oral diabetes medicines) to make sure you have good blood sugar levels. ? The goal for blood sugar control after surgery is 80-180 mg/dL.              Do not wear jewelry.            Do not wear lotions, powders, or colognes, or deodorant.            Men may shave face and neck.            Do not bring valuables to the hospital.            [facility] is not responsible for any belongings or valuables.  Hearing aids, eyeglasses, contacts, dentures or bridgework may not be worn into surgery.  Leave your suitcase in the car.  After surgery it may be brought to your room.  For patients admitted to the hospital, discharge time will be  determined by your treatment team.  Patients discharged the day of surgery will not be allowed to drive home.   Name and phone number of your driver:    Special instructions:   [facility]- Preparing For Surgery  Before surgery, you can play an important role. Because skin is not sterile, your skin needs to be as free of germs as possible. You can reduce the number of germs on your skin by washing with CHG (chlorahexidine gluconate) Soap before surgery.  CHG is an antiseptic cleaner which kills germs and bonds with the skin to continue killing germs even after washing.  Please do not use if you have an allergy to CHG or antibacterial soaps. If your skin becomes reddened/irritated stop using the CHG.  Do not shave (including legs and underarms) for at least 48 hours prior to first CHG shower. It is OK to shave your face.  Please follow these instructions carefully.                                                                                                                     1. Shower the NIGHT BEFORE SURGERY and the MORNING OF SURGERY with CHG.   2. If you chose to wash your hair, wash your hair first as usual with your normal shampoo.  3. After you shampoo, rinse your hair and body thoroughly to remove the shampoo.  4. Use CHG as you would any other liquid soap. You can apply CHG directly to the skin and wash gently with a scrungie  or a clean washcloth.   5. Apply the CHG Soap to your body ONLY FROM THE NECK DOWN.  Do not use on open wounds or open sores. Avoid contact with your eyes, ears, mouth and genitals (private parts). Wash Face and genitals (private parts)  with your normal soap.  6. Wash thoroughly, paying special attention to the area where your surgery will be performed.  7. Thoroughly rinse your body with warm water from the neck down.  8. DO NOT shower/wash with your normal soap after using and rinsing off the CHG Soap.  9. Pat yourself dry with a CLEAN  TOWEL.  10. Wear CLEAN PAJAMAS to bed the night before surgery, wear comfortable clothes the morning of surgery  11. Place CLEAN SHEETS on your bed the night of your first shower and DO NOT SLEEP WITH PETS.    Day of Surgery: Shower as stated above. Do not apply any deodorants/lotions.  Please wear clean clothes to the hospital/surgery center.      Please read over the following fact sheets that you were given.

## 2017-11-29 NOTE — Progress Notes (Signed)
Patient states he is to arrive day of surgery at 79, per Lauren.    Patient informed nurse that he has increased his insulin himself.  He is now taking 60 unites of NPH at 1200 and 1700 and takes 40 units of Novolin R at 1200 and 1700.  Patient states that he only checks his blood sugar once a day and that since he has started taking Lasix, his blood sugar has increased which is why he self increased his insulin.    Patient stated that he feels very confused because he has been told many different things about his medications.    Patient verbalized DOS instructions.    Office has been notified about blood sugar.

## 2017-11-29 NOTE — Telephone Encounter (Signed)
  [facility]   MULTIDISCIPLINARY HEART VALVE TEAM   Mr Cameron [name] was noted to be >500 on pre admission labs today. I called him and he reported not taking his insulin today because he left it at home. His labs were drawn shortly after a high carbohydrate lunch of fried fish and french fries. He says he checks his [name] everyday and it usually runs <200. He will keep an eye on it this weekend and Monday prior to TAVR next Tuesday and let us know if he has any other high readings.   Cameron Form PA-C  [facility]

## 2017-11-29 NOTE — Progress Notes (Addendum)
PCP: Elesa Hacker, MD  Cardiologist: Dr. Vernell Leep  EKG: 09/24/17 in Epic, obtained today  Stress test: pt denies  ECHO:11/25/17 in Epic  Cardiac Cath: 11/202018  Chest x-ray: 12/26/13 in Epic, obtained today  Pt's blood glucose is 439.  He did not take his normal insulin this morning.  Dr. Pearson Grippe manages his diabetes.  CMP blood glucose 533...called and left a message with Levonne Spiller, RN at office informing her of this and that other labs are also abnormal per order. 11/29/17 1600

## 2017-12-02 MED ORDER — NITROGLYCERIN IN D5W 200-5 MCG/ML-% IV SOLN
2.0000 ug/min | INTRAVENOUS | Status: DC
  Filled 2017-12-02: qty 250

## 2017-12-02 MED ORDER — DEXMEDETOMIDINE HCL IN NACL 400 MCG/100ML IV SOLN
0.1000 ug/kg/h | INTRAVENOUS | Status: AC
  Administered 2017-12-03: 1 ug/kg/h via INTRAVENOUS
  Filled 2017-12-02: qty 100

## 2017-12-02 MED ORDER — SODIUM CHLORIDE 0.9 % IV SOLN
1500.0000 mg | INTRAVENOUS | Status: AC
  Administered 2017-12-03: 1250 mg via INTRAVENOUS
  Filled 2017-12-02: qty 1500

## 2017-12-02 MED ORDER — POTASSIUM CHLORIDE 2 MEQ/ML IV SOLN
80.0000 meq | INTRAVENOUS | Status: DC
  Filled 2017-12-02: qty 40

## 2017-12-02 MED ORDER — SODIUM CHLORIDE 0.9 % IV SOLN
INTRAVENOUS | Status: DC
  Filled 2017-12-02: qty 1

## 2017-12-02 MED ORDER — CHLORHEXIDINE GLUCONATE 0.12 % MT SOLN
15.0000 mL | Freq: Once | OROMUCOSAL | Status: AC
  Administered 2017-12-03: 15 mL via OROMUCOSAL
  Filled 2017-12-02: qty 15

## 2017-12-02 MED ORDER — SODIUM CHLORIDE 0.9 % IV SOLN
INTRAVENOUS | Status: DC
  Filled 2017-12-02: qty 30

## 2017-12-02 MED ORDER — SODIUM CHLORIDE 0.9 % IV SOLN
30.0000 ug/min | INTRAVENOUS | Status: DC
  Filled 2017-12-02: qty 2

## 2017-12-02 MED ORDER — SODIUM CHLORIDE 0.9 % IV SOLN: INTRAVENOUS | Status: DC

## 2017-12-02 MED ORDER — DEXTROSE 5 % IV SOLN
1.5000 g | INTRAVENOUS | Status: AC
  Administered 2017-12-03: 1.5 g via INTRAVENOUS
  Filled 2017-12-02: qty 1.5

## 2017-12-02 MED ORDER — DEXTROSE 5 % IV SOLN
0.0000 ug/min | INTRAVENOUS | Status: DC
  Filled 2017-12-02: qty 4

## 2017-12-02 MED ORDER — DOPAMINE-DEXTROSE 3.2-5 MG/ML-% IV SOLN
0.0000 ug/kg/min | INTRAVENOUS | Status: DC
  Filled 2017-12-02: qty 250

## 2017-12-02 MED ORDER — EPINEPHRINE PF 1 MG/ML IJ SOLN
0.0000 ug/min | INTRAVENOUS | Status: DC
  Filled 2017-12-02: qty 4

## 2017-12-02 MED ORDER — MAGNESIUM SULFATE 50 % IJ SOLN
40.0000 meq | INTRAMUSCULAR | Status: DC
  Filled 2017-12-02: qty 9.85

## 2017-12-03 ENCOUNTER — Ambulatory Visit (HOSPITAL_COMMUNITY): Payer: Medicare Other | Attending: Cardiovascular Disease

## 2017-12-03 ENCOUNTER — Encounter (HOSPITAL_COMMUNITY): Payer: Self-pay | Admitting: *Deleted

## 2017-12-03 ENCOUNTER — Encounter (HOSPITAL_COMMUNITY)
Admission: RE | Disposition: A | Discharge status: Home / Self Care | Payer: Self-pay | Source: Ambulatory Visit | Attending: Cardiovascular Disease

## 2017-12-03 ENCOUNTER — Inpatient Hospital Stay (HOSPITAL_COMMUNITY): Payer: Medicare Other

## 2017-12-03 ENCOUNTER — Inpatient Hospital Stay (HOSPITAL_COMMUNITY)
Admission: RE | Admit: 2017-12-03 | Discharge: 2017-12-05 | DRG: 266 | Disposition: A | Discharge status: Home / Self Care | Payer: Medicare Other | Source: Ambulatory Visit | Attending: Cardiovascular Disease | Admitting: Cardiovascular Disease

## 2017-12-03 ENCOUNTER — Inpatient Hospital Stay (HOSPITAL_COMMUNITY): Payer: Medicare Other | Admitting: Vascular Surgery

## 2017-12-03 ENCOUNTER — Inpatient Hospital Stay (HOSPITAL_COMMUNITY): Payer: Medicare Other | Admitting: Certified Registered"

## 2017-12-03 DIAGNOSIS — Z951 Presence of aortocoronary bypass graft: Secondary | ICD-10-CM

## 2017-12-03 DIAGNOSIS — I2510 Atherosclerotic heart disease of native coronary artery without angina pectoris: Secondary | ICD-10-CM | POA: Diagnosis present

## 2017-12-03 DIAGNOSIS — E1122 Type 2 diabetes mellitus with diabetic chronic kidney disease: Secondary | ICD-10-CM | POA: Diagnosis present

## 2017-12-03 DIAGNOSIS — I252 Old myocardial infarction: Secondary | ICD-10-CM

## 2017-12-03 DIAGNOSIS — E669 Obesity, unspecified: Secondary | ICD-10-CM | POA: Diagnosis present

## 2017-12-03 DIAGNOSIS — Z79899 Other long term (current) drug therapy: Secondary | ICD-10-CM

## 2017-12-03 DIAGNOSIS — I4510 Unspecified right bundle-branch block: Secondary | ICD-10-CM | POA: Diagnosis present

## 2017-12-03 DIAGNOSIS — Z7982 Long term (current) use of aspirin: Secondary | ICD-10-CM | POA: Diagnosis not present

## 2017-12-03 DIAGNOSIS — I350 Nonrheumatic aortic (valve) stenosis: Secondary | ICD-10-CM | POA: Diagnosis present

## 2017-12-03 DIAGNOSIS — I25119 Atherosclerotic heart disease of native coronary artery with unspecified angina pectoris: Secondary | ICD-10-CM | POA: Diagnosis present

## 2017-12-03 DIAGNOSIS — I130 Hypertensive heart and chronic kidney disease with heart failure and stage 1 through stage 4 chronic kidney disease, or unspecified chronic kidney disease: Secondary | ICD-10-CM | POA: Diagnosis present

## 2017-12-03 DIAGNOSIS — I080 Rheumatic disorders of both mitral and aortic valves: Secondary | ICD-10-CM | POA: Insufficient documentation

## 2017-12-03 DIAGNOSIS — Z952 Presence of prosthetic heart valve: Secondary | ICD-10-CM | POA: Diagnosis not present

## 2017-12-03 DIAGNOSIS — I5032 Chronic diastolic (congestive) heart failure: Secondary | ICD-10-CM | POA: Diagnosis present

## 2017-12-03 DIAGNOSIS — E785 Hyperlipidemia, unspecified: Secondary | ICD-10-CM | POA: Diagnosis present

## 2017-12-03 DIAGNOSIS — Z8249 Family history of ischemic heart disease and other diseases of the circulatory system: Secondary | ICD-10-CM

## 2017-12-03 DIAGNOSIS — I25710 Atherosclerosis of autologous vein coronary artery bypass graft(s) with unstable angina pectoris: Secondary | ICD-10-CM | POA: Diagnosis present

## 2017-12-03 DIAGNOSIS — J449 Chronic obstructive pulmonary disease, unspecified: Secondary | ICD-10-CM | POA: Diagnosis present

## 2017-12-03 DIAGNOSIS — I272 Pulmonary hypertension, unspecified: Secondary | ICD-10-CM | POA: Diagnosis present

## 2017-12-03 DIAGNOSIS — Z6831 Body mass index (BMI) 31.0-31.9, adult: Secondary | ICD-10-CM | POA: Diagnosis not present

## 2017-12-03 DIAGNOSIS — I351 Nonrheumatic aortic (valve) insufficiency: Secondary | ICD-10-CM | POA: Diagnosis not present

## 2017-12-03 DIAGNOSIS — E1165 Type 2 diabetes mellitus with hyperglycemia: Secondary | ICD-10-CM | POA: Diagnosis present

## 2017-12-03 DIAGNOSIS — Z7902 Long term (current) use of antithrombotics/antiplatelets: Secondary | ICD-10-CM | POA: Diagnosis not present

## 2017-12-03 DIAGNOSIS — Z7901 Long term (current) use of anticoagulants: Secondary | ICD-10-CM | POA: Diagnosis not present

## 2017-12-03 DIAGNOSIS — N189 Chronic kidney disease, unspecified: Secondary | ICD-10-CM | POA: Diagnosis present

## 2017-12-03 DIAGNOSIS — Z006 Encounter for examination for normal comparison and control in clinical research program: Secondary | ICD-10-CM

## 2017-12-03 DIAGNOSIS — I5033 Acute on chronic diastolic (congestive) heart failure: Secondary | ICD-10-CM | POA: Diagnosis present

## 2017-12-03 DIAGNOSIS — Z87891 Personal history of nicotine dependence: Secondary | ICD-10-CM

## 2017-12-03 DIAGNOSIS — Z794 Long term (current) use of insulin: Secondary | ICD-10-CM

## 2017-12-03 DIAGNOSIS — I25118 Atherosclerotic heart disease of native coronary artery with other forms of angina pectoris: Secondary | ICD-10-CM | POA: Diagnosis present

## 2017-12-03 DIAGNOSIS — N184 Chronic kidney disease, stage 4 (severe): Secondary | ICD-10-CM | POA: Diagnosis present

## 2017-12-03 DIAGNOSIS — I480 Paroxysmal atrial fibrillation: Secondary | ICD-10-CM | POA: Diagnosis present

## 2017-12-03 HISTORY — DX: Presence of prosthetic heart valve: Z95.2

## 2017-12-03 HISTORY — DX: Paroxysmal atrial fibrillation: I48.0

## 2017-12-03 HISTORY — DX: Unspecified right bundle-branch block: I45.10

## 2017-12-03 HISTORY — PX: TEE WITHOUT CARDIOVERSION: SHX5443

## 2017-12-03 HISTORY — PX: TRANSCATHETER AORTIC VALVE REPLACEMENT, TRANSFEMORAL: SHX6400

## 2017-12-03 LAB — ECHOCARDIOGRAM LIMITED
AV pk vel: 116 cm/s
AVG: 3 mmHg
AVPG: 5 mmHg
DOP CAL AO MEAN VELOCITY: 75 cm/s
LDCA: 3.14 cm2
LVOTD: 20 mm
VTI: 23.7 cm

## 2017-12-03 LAB — CBC
HEMATOCRIT: 44.7 % (ref 39.0–52.0)
HEMOGLOBIN: 14.8 g/dL (ref 13.0–17.0)
MCH: 27.9 pg (ref 26.0–34.0)
MCHC: 33.1 g/dL (ref 30.0–36.0)
MCV: 84.3 fL (ref 78.0–100.0)
Platelets: 127 10*3/uL — ABNORMAL LOW (ref 150–400)
RBC: 5.3 MIL/uL (ref 4.22–5.81)
RDW: 14.5 % (ref 11.5–15.5)
WBC: 9.5 10*3/uL (ref 4.0–10.5)

## 2017-12-03 LAB — POCT I-STAT, CHEM 8
BUN: 39 mg/dL — AB (ref 6–20)
BUN: 40 mg/dL — ABNORMAL HIGH (ref 6–20)
BUN: 39 mg/dL — ABNORMAL HIGH (ref 6–20)
CHLORIDE: 103 mmol/L (ref 101–111)
CREATININE: 2.5 mg/dL — AB (ref 0.61–1.24)
Calcium, Ion: 1.12 mmol/L — ABNORMAL LOW (ref 1.15–1.40)
Calcium, Ion: 1.15 mmol/L (ref 1.15–1.40)
Calcium, Ion: 1.14 mmol/L — ABNORMAL LOW (ref 1.15–1.40)
Chloride: 103 mmol/L (ref 101–111)
Chloride: 102 mmol/L (ref 101–111)
Creatinine, Ser: 2.4 mg/dL — ABNORMAL HIGH (ref 0.61–1.24)
Creatinine, Ser: 2.5 mg/dL — ABNORMAL HIGH (ref 0.61–1.24)
Glucose, Bld: 102 mg/dL — ABNORMAL HIGH (ref 65–99)
Glucose, Bld: 112 mg/dL — ABNORMAL HIGH (ref 65–99)
Glucose, Bld: 98 mg/dL (ref 65–99)
HCT: 37 % — ABNORMAL LOW (ref 39.0–52.0)
HEMATOCRIT: 38 % — AB (ref 39.0–52.0)
HEMATOCRIT: 39 % (ref 39.0–52.0)
HEMOGLOBIN: 12.6 g/dL — AB (ref 13.0–17.0)
HEMOGLOBIN: 13.3 g/dL (ref 13.0–17.0)
Hemoglobin: 12.9 g/dL — ABNORMAL LOW (ref 13.0–17.0)
POTASSIUM: 3.6 mmol/L (ref 3.5–5.1)
Potassium: 3.6 mmol/L (ref 3.5–5.1)
Potassium: 3.9 mmol/L (ref 3.5–5.1)
SODIUM: 141 mmol/L (ref 135–145)
SODIUM: 141 mmol/L (ref 135–145)
Sodium: 141 mmol/L (ref 135–145)
TCO2: 30 mmol/L (ref 22–32)
TCO2: 30 mmol/L (ref 22–32)
TCO2: 27 mmol/L (ref 22–32)

## 2017-12-03 LAB — GLUCOSE, CAPILLARY
GLUCOSE-CAPILLARY: 79 mg/dL (ref 65–99)
Glucose-Capillary: 63 mg/dL — ABNORMAL LOW (ref 65–99)
Glucose-Capillary: 179 mg/dL — ABNORMAL HIGH (ref 65–99)
Glucose-Capillary: 139 mg/dL — ABNORMAL HIGH (ref 65–99)
Glucose-Capillary: 174 mg/dL — ABNORMAL HIGH (ref 65–99)

## 2017-12-03 LAB — PROTIME-INR
INR: 1.3
PROTHROMBIN TIME: 16.1 s — AB (ref 11.4–15.2)

## 2017-12-03 LAB — POCT I-STAT 4, (NA,K, GLUC, HGB,HCT)
Glucose, Bld: 99 mg/dL (ref 65–99)
HCT: 43 % (ref 39.0–52.0)
HEMOGLOBIN: 14.6 g/dL (ref 13.0–17.0)
Potassium: 3.7 mmol/L (ref 3.5–5.1)
SODIUM: 145 mmol/L (ref 135–145)

## 2017-12-03 LAB — APTT: APTT: 36 s (ref 24–36)

## 2017-12-03 SURGERY — IMPLANTATION, AORTIC VALVE, TRANSCATHETER, FEMORAL APPROACH
Anesthesia: General | Site: Chest

## 2017-12-03 MED ORDER — CHLORHEXIDINE GLUCONATE CLOTH 2 % EX PADS
6.0000 | MEDICATED_PAD | Freq: Every day | CUTANEOUS | Status: DC
  Administered 2017-12-03: 6 via TOPICAL

## 2017-12-03 MED ORDER — DEXMEDETOMIDINE HCL 200 MCG/2ML IV SOLN
INTRAVENOUS | Status: DC | PRN
  Administered 2017-12-03: 96.6 ug via INTRAVENOUS

## 2017-12-03 MED ORDER — ONDANSETRON HCL 4 MG/2ML IJ SOLN
INTRAMUSCULAR | Status: AC
  Filled 2017-12-03: qty 2

## 2017-12-03 MED ORDER — PROPOFOL 10 MG/ML IV BOLUS
INTRAVENOUS | Status: DC | PRN
  Administered 2017-12-03: 90 mg via INTRAVENOUS

## 2017-12-03 MED ORDER — TRAMADOL HCL 50 MG PO TABS: 50.0000 mg | ORAL_TABLET | Freq: Four times a day (QID) | ORAL | Status: DC | PRN

## 2017-12-03 MED ORDER — LACTATED RINGERS IV SOLN
INTRAVENOUS | Status: DC | PRN
  Administered 2017-12-03: 14:00:00 via INTRAVENOUS

## 2017-12-03 MED ORDER — MIDAZOLAM HCL 2 MG/2ML IJ SOLN
1.0000 mg | Freq: Once | INTRAMUSCULAR | Status: AC
  Administered 2017-12-03: 1 mg via INTRAVENOUS

## 2017-12-03 MED ORDER — MORPHINE SULFATE (PF) 2 MG/ML IV SOLN: 2.0000 mg | INTRAVENOUS | Status: DC | PRN

## 2017-12-03 MED ORDER — MORPHINE SULFATE (PF) 4 MG/ML IV SOLN: 2.0000 mg | INTRAVENOUS | Status: DC | PRN

## 2017-12-03 MED ORDER — GLYCOPYRROLATE 0.2 MG/ML IV SOSY
PREFILLED_SYRINGE | INTRAVENOUS | Status: DC | PRN
  Administered 2017-12-03: 0.8 mg via INTRAVENOUS

## 2017-12-03 MED ORDER — ACETAMINOPHEN 500 MG PO TABS
1000.0000 mg | ORAL_TABLET | Freq: Four times a day (QID) | ORAL | Status: DC
  Administered 2017-12-04 – 2017-12-05 (×3): 1000 mg via ORAL
  Filled 2017-12-03 (×3): qty 2

## 2017-12-03 MED ORDER — FENTANYL CITRATE (PF) 250 MCG/5ML IJ SOLN
INTRAMUSCULAR | Status: DC | PRN
  Administered 2017-12-03: 50 ug via INTRAVENOUS

## 2017-12-03 MED ORDER — PROPOFOL 10 MG/ML IV BOLUS
INTRAVENOUS | Status: AC
  Filled 2017-12-03: qty 20

## 2017-12-03 MED ORDER — SODIUM CHLORIDE 0.9% FLUSH
10.0000 mL | Freq: Two times a day (BID) | INTRAVENOUS | Status: DC
  Administered 2017-12-03 – 2017-12-04 (×2): 10 mL

## 2017-12-03 MED ORDER — PROTAMINE SULFATE 10 MG/ML IV SOLN
INTRAVENOUS | Status: AC
  Filled 2017-12-03: qty 25

## 2017-12-03 MED ORDER — ROCURONIUM BROMIDE 10 MG/ML (PF) SYRINGE
PREFILLED_SYRINGE | INTRAVENOUS | Status: AC
  Filled 2017-12-03: qty 5

## 2017-12-03 MED ORDER — NITROGLYCERIN IN D5W 200-5 MCG/ML-% IV SOLN
0.0000 ug/min | INTRAVENOUS | Status: DC
  Administered 2017-12-03: 10 ug/min via INTRAVENOUS

## 2017-12-03 MED ORDER — DEXAMETHASONE SODIUM PHOSPHATE 10 MG/ML IJ SOLN
INTRAMUSCULAR | Status: AC
  Filled 2017-12-03: qty 1

## 2017-12-03 MED ORDER — NOREPINEPHRINE BITARTRATE 1 MG/ML IV SOLN
INTRAVENOUS | Status: DC | PRN
  Administered 2017-12-03: 1 ug/min via INTRAVENOUS

## 2017-12-03 MED ORDER — MIDAZOLAM HCL 2 MG/2ML IJ SOLN: 2.0000 mg | INTRAMUSCULAR | Status: DC | PRN

## 2017-12-03 MED ORDER — MUPIROCIN 2 % EX OINT
1.0000 "application " | TOPICAL_OINTMENT | Freq: Once | CUTANEOUS | Status: AC
  Administered 2017-12-03: 1 via TOPICAL

## 2017-12-03 MED ORDER — DEXAMETHASONE SODIUM PHOSPHATE 10 MG/ML IJ SOLN
INTRAMUSCULAR | Status: DC | PRN
  Administered 2017-12-03: 4 mg via INTRAVENOUS

## 2017-12-03 MED ORDER — SODIUM CHLORIDE 0.9 % IV SOLN
0.0000 ug/min | INTRAVENOUS | Status: DC
  Filled 2017-12-03: qty 2

## 2017-12-03 MED ORDER — DEXTROSE 50 % IV SOLN
25.0000 g | Freq: Once | INTRAVENOUS | Status: AC
  Administered 2017-12-03: 25 g via INTRAVENOUS

## 2017-12-03 MED ORDER — CEFUROXIME SODIUM 1.5 G IV SOLR
1.5000 g | Freq: Two times a day (BID) | INTRAVENOUS | Status: DC
  Administered 2017-12-04 – 2017-12-05 (×3): 1.5 g via INTRAVENOUS
  Filled 2017-12-03 (×5): qty 1.5

## 2017-12-03 MED ORDER — MUPIROCIN 2 % EX OINT
TOPICAL_OINTMENT | CUTANEOUS | Status: AC
  Administered 2017-12-03: 1 via TOPICAL
  Filled 2017-12-03: qty 22

## 2017-12-03 MED ORDER — DEXTROSE 50 % IV SOLN
INTRAVENOUS | Status: AC
  Administered 2017-12-03: 25 g via INTRAVENOUS
  Filled 2017-12-03: qty 50

## 2017-12-03 MED ORDER — PHENYLEPHRINE 40 MCG/ML (10ML) SYRINGE FOR IV PUSH (FOR BLOOD PRESSURE SUPPORT)
PREFILLED_SYRINGE | INTRAVENOUS | Status: AC
  Filled 2017-12-03: qty 10

## 2017-12-03 MED ORDER — SUCCINYLCHOLINE CHLORIDE 200 MG/10ML IV SOSY
PREFILLED_SYRINGE | INTRAVENOUS | Status: AC
  Filled 2017-12-03: qty 10

## 2017-12-03 MED ORDER — NEOSTIGMINE METHYLSULFATE 5 MG/5ML IV SOSY
PREFILLED_SYRINGE | INTRAVENOUS | Status: DC | PRN
  Administered 2017-12-03: 5 mg via INTRAVENOUS

## 2017-12-03 MED ORDER — ASPIRIN 81 MG PO CHEW: 81.0000 mg | CHEWABLE_TABLET | Freq: Every day | ORAL | Status: DC

## 2017-12-03 MED ORDER — VANCOMYCIN HCL IN DEXTROSE 1-5 GM/200ML-% IV SOLN
1000.0000 mg | Freq: Once | INTRAVENOUS | Status: AC
  Administered 2017-12-04: 1000 mg via INTRAVENOUS
  Filled 2017-12-03: qty 200

## 2017-12-03 MED ORDER — SODIUM CHLORIDE 0.9 % IV SOLN
INTRAVENOUS | Status: DC | PRN
  Administered 2017-12-03 (×3): 600 mL

## 2017-12-03 MED ORDER — IODIXANOL 320 MG/ML IV SOLN
INTRAVENOUS | Status: DC | PRN
  Administered 2017-12-03: 150 mL via INTRAVENOUS

## 2017-12-03 MED ORDER — ROCURONIUM BROMIDE 100 MG/10ML IV SOLN
INTRAVENOUS | Status: DC | PRN
  Administered 2017-12-03: 40 mg via INTRAVENOUS

## 2017-12-03 MED ORDER — 0.9 % SODIUM CHLORIDE (POUR BTL) OPTIME
TOPICAL | Status: DC | PRN
  Administered 2017-12-03: 1000 mL

## 2017-12-03 MED ORDER — SODIUM CHLORIDE 0.9% FLUSH: 10.0000 mL | INTRAVENOUS | Status: DC | PRN

## 2017-12-03 MED ORDER — EPHEDRINE 5 MG/ML INJ
INTRAVENOUS | Status: AC
  Filled 2017-12-03: qty 10

## 2017-12-03 MED ORDER — CHLORHEXIDINE GLUCONATE 4 % EX LIQD: 60.0000 mL | Freq: Once | CUTANEOUS | Status: DC

## 2017-12-03 MED ORDER — LIDOCAINE HCL (PF) 1 % IJ SOLN
INTRAMUSCULAR | Status: AC
  Filled 2017-12-03: qty 30

## 2017-12-03 MED ORDER — HEPARIN SODIUM (PORCINE) 1000 UNIT/ML IJ SOLN
INTRAMUSCULAR | Status: AC
  Filled 2017-12-03: qty 1

## 2017-12-03 MED ORDER — ASPIRIN EC 81 MG PO TBEC
81.0000 mg | DELAYED_RELEASE_TABLET | Freq: Every day | ORAL | Status: DC
  Administered 2017-12-04 – 2017-12-05 (×2): 81 mg via ORAL
  Filled 2017-12-03 (×2): qty 1

## 2017-12-03 MED ORDER — FENTANYL CITRATE (PF) 100 MCG/2ML IJ SOLN: INTRAMUSCULAR | Status: DC | PRN

## 2017-12-03 MED ORDER — ACETAMINOPHEN 160 MG/5ML PO SOLN: 1000.0000 mg | Freq: Four times a day (QID) | ORAL | Status: DC

## 2017-12-03 MED ORDER — FAMOTIDINE IN NACL 20-0.9 MG/50ML-% IV SOLN
20.0000 mg | INTRAVENOUS | Status: AC
  Administered 2017-12-03: 20 mg via INTRAVENOUS
  Filled 2017-12-03: qty 50

## 2017-12-03 MED ORDER — PANTOPRAZOLE SODIUM 40 MG PO TBEC
40.0000 mg | DELAYED_RELEASE_TABLET | Freq: Every day | ORAL | Status: DC
  Administered 2017-12-04 – 2017-12-05 (×2): 40 mg via ORAL
  Filled 2017-12-03 (×2): qty 1

## 2017-12-03 MED ORDER — LACTATED RINGERS IV SOLN
INTRAVENOUS | Status: DC
  Administered 2017-12-03: 12:00:00 via INTRAVENOUS

## 2017-12-03 MED ORDER — FENTANYL CITRATE (PF) 100 MCG/2ML IJ SOLN
INTRAMUSCULAR | Status: AC
  Filled 2017-12-03: qty 2

## 2017-12-03 MED ORDER — CHLORHEXIDINE GLUCONATE 4 % EX LIQD: 30.0000 mL | CUTANEOUS | Status: DC

## 2017-12-03 MED ORDER — FENTANYL CITRATE (PF) 100 MCG/2ML IJ SOLN
50.0000 ug | Freq: Once | INTRAMUSCULAR | Status: AC
  Administered 2017-12-03: 50 ug via INTRAVENOUS

## 2017-12-03 MED ORDER — PROTAMINE SULFATE 10 MG/ML IV SOLN
INTRAVENOUS | Status: DC | PRN
  Administered 2017-12-03: 30 mg via INTRAVENOUS
  Administered 2017-12-03: 20 mg via INTRAVENOUS
  Administered 2017-12-03 (×2): 35 mg via INTRAVENOUS
  Administered 2017-12-03: 30 mg via INTRAVENOUS

## 2017-12-03 MED ORDER — LIDOCAINE HCL 1 % IJ SOLN
INTRAMUSCULAR | Status: DC | PRN
  Administered 2017-12-03: 30 mL

## 2017-12-03 MED ORDER — ONDANSETRON HCL 4 MG/2ML IJ SOLN: 4.0000 mg | Freq: Four times a day (QID) | INTRAMUSCULAR | Status: DC | PRN

## 2017-12-03 MED ORDER — ALBUMIN HUMAN 5 % IV SOLN: 250.0000 mL | INTRAVENOUS | Status: DC | PRN

## 2017-12-03 MED ORDER — SODIUM CHLORIDE 0.9 % IV SOLN
INTRAVENOUS | Status: DC
  Administered 2017-12-03: 17:00:00 via INTRAVENOUS

## 2017-12-03 MED ORDER — ARTIFICIAL TEARS OPHTHALMIC OINT
TOPICAL_OINTMENT | OPHTHALMIC | Status: AC
  Filled 2017-12-03: qty 7

## 2017-12-03 MED ORDER — TRAMADOL HCL 50 MG PO TABS: 50.0000 mg | ORAL_TABLET | ORAL | Status: DC | PRN

## 2017-12-03 MED ORDER — ROSUVASTATIN CALCIUM 20 MG PO TABS
40.0000 mg | ORAL_TABLET | Freq: Every day | ORAL | Status: DC
  Administered 2017-12-03 – 2017-12-04 (×2): 40 mg via ORAL
  Filled 2017-12-03 (×2): qty 1

## 2017-12-03 MED ORDER — PHENYLEPHRINE HCL 10 MG/ML IJ SOLN
INTRAVENOUS | Status: DC | PRN
  Administered 2017-12-03: 30 ug/min via INTRAVENOUS

## 2017-12-03 MED ORDER — INSULIN ASPART 100 UNIT/ML ~~LOC~~ SOLN
0.0000 [IU] | Freq: Three times a day (TID) | SUBCUTANEOUS | Status: DC
  Administered 2017-12-04: 4 [IU] via SUBCUTANEOUS
  Administered 2017-12-04: 11 [IU] via SUBCUTANEOUS
  Administered 2017-12-04: 7 [IU] via SUBCUTANEOUS
  Administered 2017-12-05: 15 [IU] via SUBCUTANEOUS

## 2017-12-03 MED ORDER — HEPARIN SODIUM (PORCINE) 1000 UNIT/ML IJ SOLN
INTRAMUSCULAR | Status: DC | PRN
  Administered 2017-12-03: 15000 [IU] via INTRAVENOUS

## 2017-12-03 MED ORDER — EPHEDRINE SULFATE 50 MG/ML IJ SOLN
INTRAMUSCULAR | Status: DC | PRN
  Administered 2017-12-03: 10 mg via INTRAVENOUS

## 2017-12-03 MED ORDER — NEOSTIGMINE METHYLSULFATE 5 MG/5ML IV SOSY
PREFILLED_SYRINGE | INTRAVENOUS | Status: AC
  Filled 2017-12-03: qty 5

## 2017-12-03 MED ORDER — MIDAZOLAM HCL 2 MG/2ML IJ SOLN
INTRAMUSCULAR | Status: AC
  Filled 2017-12-03: qty 2

## 2017-12-03 MED ORDER — PHENYLEPHRINE 40 MCG/ML (10ML) SYRINGE FOR IV PUSH (FOR BLOOD PRESSURE SUPPORT)
PREFILLED_SYRINGE | INTRAVENOUS | Status: DC | PRN
  Administered 2017-12-03: 120 ug via INTRAVENOUS
  Administered 2017-12-03: 200 ug via INTRAVENOUS
  Administered 2017-12-03: 80 ug via INTRAVENOUS

## 2017-12-03 MED ORDER — ONDANSETRON HCL 4 MG/2ML IJ SOLN
INTRAMUSCULAR | Status: DC | PRN
  Administered 2017-12-03: 4 mg via INTRAVENOUS

## 2017-12-03 MED ORDER — METOPROLOL TARTRATE 5 MG/5ML IV SOLN
2.5000 mg | INTRAVENOUS | Status: DC | PRN
  Administered 2017-12-04: 5 mg via INTRAVENOUS
  Filled 2017-12-03: qty 5

## 2017-12-03 MED ORDER — FENTANYL CITRATE (PF) 250 MCG/5ML IJ SOLN
INTRAMUSCULAR | Status: AC
  Filled 2017-12-03: qty 5

## 2017-12-03 MED ORDER — OXYCODONE HCL 5 MG PO TABS: 5.0000 mg | ORAL_TABLET | ORAL | Status: DC | PRN

## 2017-12-03 SURGICAL SUPPLY — 102 items
ADAPTER UNIV SWAN GANZ BIP (ADAPTER) ×1 IMPLANT
ADAPTER UNV SWAN GANZ BIP (ADAPTER) ×2
ADH SKN CLS APL DERMABOND .7 (GAUZE/BANDAGES/DRESSINGS) ×2
ADPR CATH UNV NS SG CATH (ADAPTER) ×1
ATTRACTOMAT 16X20 MAGNETIC DRP (DRAPES) IMPLANT
BAG BANDED W/RUBBER/TAPE 36X54 (MISCELLANEOUS) ×3 IMPLANT
BAG DECANTER FOR FLEXI CONT (MISCELLANEOUS) IMPLANT
BAG EQP BAND 135X91 W/RBR TAPE (MISCELLANEOUS) ×1
BAG SNAP BAND KOVER 36X36 (MISCELLANEOUS) ×6 IMPLANT
BLADE 10 SAFETY STRL DISP (BLADE) ×3 IMPLANT
BLADE CLIPPER SURG (BLADE) IMPLANT
BLADE STERNUM SYSTEM 6 (BLADE) ×3 IMPLANT
CABLE ADAPT CONN TEMP 6FT (ADAPTER) ×3 IMPLANT
CABLE PACING FASLOC BIEGE (MISCELLANEOUS) ×3 IMPLANT
CABLE PACING FASLOC BLUE (MISCELLANEOUS) ×3 IMPLANT
CANISTER SUCT 3000ML PPV (MISCELLANEOUS) IMPLANT
CANNULA FEM VENOUS REMOTE 22FR (CANNULA) IMPLANT
CANNULA OPTISITE PERFUSION 16F (CANNULA) IMPLANT
CANNULA OPTISITE PERFUSION 18F (CANNULA) IMPLANT
CATH DIAG EXPO 6F VENT PIG 145 (CATHETERS) ×6 IMPLANT
CATH EXPO 5FR AL1 (CATHETERS) ×3 IMPLANT
CATH S G BIP PACING (SET/KITS/TRAYS/PACK) ×6 IMPLANT
CLIP VESOCCLUDE MED 24/CT (CLIP) ×3 IMPLANT
CLIP VESOCCLUDE SM WIDE 24/CT (CLIP) ×3 IMPLANT
CONT SPEC 4OZ CLIKSEAL STRL BL (MISCELLANEOUS) ×6 IMPLANT
COVER BACK TABLE 60X90IN (DRAPES) ×3 IMPLANT
COVER BACK TABLE 80X110 HD (DRAPES) ×3 IMPLANT
COVER DOME SNAP 22 D (MISCELLANEOUS) ×3 IMPLANT
COVER MAYO STAND STRL (DRAPES) ×3 IMPLANT
CRADLE DONUT ADULT HEAD (MISCELLANEOUS) ×3 IMPLANT
DERMABOND ADVANCED (GAUZE/BANDAGES/DRESSINGS) ×4
DERMABOND ADVANCED .7 DNX12 (GAUZE/BANDAGES/DRESSINGS) ×1 IMPLANT
DEVICE CLOSURE PERCLS PRGLD 6F (VASCULAR PRODUCTS) ×2 IMPLANT
DRAPE INCISE IOBAN 66X45 STRL (DRAPES) IMPLANT
DRAPE SLUSH MACHINE 52X66 (DRAPES) ×3 IMPLANT
DRSG TEGADERM 4X4.75 (GAUZE/BANDAGES/DRESSINGS) ×3 IMPLANT
ELECT REM PT RETURN 9FT ADLT (ELECTROSURGICAL) ×6
ELECTRODE REM PT RTRN 9FT ADLT (ELECTROSURGICAL) ×2 IMPLANT
FELT TEFLON 6X6 (MISCELLANEOUS) ×3 IMPLANT
FEMORAL VENOUS CANN RAP (CANNULA) IMPLANT
GAUZE SPONGE 4X4 12PLY STRL (GAUZE/BANDAGES/DRESSINGS) ×3 IMPLANT
GLOVE BIO SURGEON STRL SZ7.5 (GLOVE) ×3 IMPLANT
GLOVE BIO SURGEON STRL SZ8 (GLOVE) ×6 IMPLANT
GLOVE EUDERMIC 7 POWDERFREE (GLOVE) ×3 IMPLANT
GLOVE ORTHO TXT STRL SZ7.5 (GLOVE) ×3 IMPLANT
GOWN STRL REUS W/ TWL LRG LVL3 (GOWN DISPOSABLE) ×3 IMPLANT
GOWN STRL REUS W/ TWL XL LVL3 (GOWN DISPOSABLE) ×6 IMPLANT
GOWN STRL REUS W/TWL LRG LVL3 (GOWN DISPOSABLE) ×9
GOWN STRL REUS W/TWL XL LVL3 (GOWN DISPOSABLE) ×18
GUIDEWIRE SAF TJ AMPL .035X180 (WIRE) ×3 IMPLANT
GUIDEWIRE SAFE TJ AMPLATZ EXST (WIRE) ×3 IMPLANT
GUIDEWIRE STRAIGHT .035 260CM (WIRE) ×3 IMPLANT
INSERT FOGARTY 61MM (MISCELLANEOUS) ×3 IMPLANT
INSERT FOGARTY SM (MISCELLANEOUS) IMPLANT
INSERT FOGARTY XLG (MISCELLANEOUS) IMPLANT
KIT BASIN OR (CUSTOM PROCEDURE TRAY) ×3 IMPLANT
KIT DILATOR VASC 18G NDL (KITS) IMPLANT
KIT HEART LEFT (KITS) ×3 IMPLANT
KIT ROOM TURNOVER OR (KITS) ×3 IMPLANT
KIT SUCTION CATH 14FR (SUCTIONS) ×6 IMPLANT
NDL PERC 18GX7CM (NEEDLE) ×1 IMPLANT
NEEDLE PERC 18GX7CM (NEEDLE) ×3 IMPLANT
NS IRRIG 1000ML POUR BTL (IV SOLUTION) ×9 IMPLANT
PACK AORTA (CUSTOM PROCEDURE TRAY) ×3 IMPLANT
PAD ARMBOARD 7.5X6 YLW CONV (MISCELLANEOUS) ×6 IMPLANT
PAD ELECT DEFIB RADIOL ZOLL (MISCELLANEOUS) ×3 IMPLANT
PATCH TACHOSII LRG 9.5X4.8 (VASCULAR PRODUCTS) IMPLANT
PERCLOSE PROGLIDE 6F (VASCULAR PRODUCTS) ×6
SET MICROPUNCTURE 5F STIFF (MISCELLANEOUS) ×3 IMPLANT
SHEATH AVANTI 11CM 8FR (MISCELLANEOUS) ×3 IMPLANT
SHEATH PINNACLE 6F 10CM (SHEATH) ×6 IMPLANT
SLEEVE REPOSITIONING LENGTH 30 (MISCELLANEOUS) ×3 IMPLANT
SPONGE LAP 4X18 X RAY DECT (DISPOSABLE) ×3 IMPLANT
STOPCOCK MORSE 400PSI 3WAY (MISCELLANEOUS) ×18 IMPLANT
SUT ETHIBOND X763 2 0 SH 1 (SUTURE) IMPLANT
SUT GORETEX CV 4 TH 22 36 (SUTURE) IMPLANT
SUT GORETEX CV4 TH-18 (SUTURE) IMPLANT
SUT GORETEX TH-18 36 INCH (SUTURE) IMPLANT
SUT MNCRL AB 3-0 PS2 18 (SUTURE) IMPLANT
SUT PROLENE 3 0 SH1 36 (SUTURE) IMPLANT
SUT PROLENE 4 0 RB 1 (SUTURE)
SUT PROLENE 4-0 RB1 .5 CRCL 36 (SUTURE) IMPLANT
SUT PROLENE 5 0 C 1 36 (SUTURE) IMPLANT
SUT PROLENE 6 0 C 1 30 (SUTURE) IMPLANT
SUT SILK  1 MH (SUTURE) ×2
SUT SILK 1 MH (SUTURE) ×1 IMPLANT
SUT SILK 2 0 SH CR/8 (SUTURE) IMPLANT
SUT VIC AB 2-0 CT1 27 (SUTURE)
SUT VIC AB 2-0 CT1 TAPERPNT 27 (SUTURE) IMPLANT
SUT VIC AB 2-0 CTX 36 (SUTURE) IMPLANT
SUT VIC AB 3-0 SH 8-18 (SUTURE) IMPLANT
SYR 10ML LL (SYRINGE) ×9 IMPLANT
SYR 30ML LL (SYRINGE) ×6 IMPLANT
SYR 50ML LL SCALE MARK (SYRINGE) ×3 IMPLANT
TOWEL OR 17X26 10 PK STRL BLUE (TOWEL DISPOSABLE) ×6 IMPLANT
TRANSDUCER W/STOPCOCK (MISCELLANEOUS) ×6 IMPLANT
TRAY FOLEY SILVER 14FR TEMP (SET/KITS/TRAYS/PACK) ×3 IMPLANT
TUBE SUCT INTRACARD DLP 20F (MISCELLANEOUS) IMPLANT
TUBING HIGH PRESSURE 120CM (CONNECTOR) ×3 IMPLANT
VALVE HEART TRANSCATH SZ3 26MM (Prosthesis & Implant Heart) ×2 IMPLANT
WIRE AMPLATZ SS-J .035X180CM (WIRE) ×3 IMPLANT
WIRE BENTSON .035X145CM (WIRE) ×3 IMPLANT

## 2017-12-03 NOTE — Transfer of Care (Signed)
Immediate Anesthesia Transfer of Care Note  Patient: Cameron [name]  Procedure(s) Performed: TRANSCATHETER AORTIC VALVE REPLACEMENT, TRANSFEMORAL (N/A Chest) TRANSESOPHAGEAL ECHOCARDIOGRAM (TEE) (N/A Chest)  Patient Location: ICU  Anesthesia Type:General  Level of Consciousness: awake, alert , patient cooperative and confused  Airway & Oxygen Therapy: Patient Spontanous Breathing and Patient connected to nasal cannula oxygen  Post-op Assessment: Report given to RN  Post vital signs: Reviewed and stable  Last Vitals:  Vitals:   12/03/17 1633 12/03/17 1638  BP:    Pulse: (!) 0 (!) 0  Resp:    Temp:    SpO2:      Last Pain: There were no vitals filed for this visit.       Complications: No apparent anesthesia complications

## 2017-12-03 NOTE — Anesthesia Procedure Notes (Signed)
Arterial Line Insertion Start/End2/02/2018 1:15 PM, 12/03/2017 1:25 PM Performed by: Oletta Lamas, CRNA, CRNA  Preanesthetic checklist: patient identified, IV checked, site marked, risks and benefits discussed, surgical consent, monitors and equipment checked, pre-op evaluation, timeout performed and anesthesia consent Lidocaine 1% used for infiltration radial was placed Catheter size: 20 G Seldinger technique used  Attempts: 1 Procedure performed without using ultrasound guided technique. Following insertion, Biopatch. Post procedure assessment: normal  Patient tolerated the procedure well with no immediate complications.

## 2017-12-03 NOTE — Op Note (Signed)
[facility]   MULTIDISCIPLINARY HEART VALVE TEAM   TAVR OPERATIVE NOTE  Date of Procedure:  12/03/2017  Preoperative Diagnosis: Severe Aortic Stenosis   Postoperative Diagnosis: Same   Procedure:   Transcatheter Aortic Valve Replacement - Percutaneous Transfemoral Approach  Edwards Sapien 3 THV (size 26 mm, model # 9600TFX, serial # [id number])   Co-Surgeons:  Valentina Gu. Roxy Manns, MD and Sherren Mocha, MD  Anesthesiologist:  Albertha Ghee, MD  Echocardiographer:  Ena Dawley, MD  Pre-operative Echo Findings:  Severe aortic stenosis  Normal left ventricular systolic function  Post-operative Echo Findings:  Mild paravalvular leak  Normal left ventricular systolic function  BRIEF CLINICAL NOTE AND INDICATIONS FOR SURGERY  77 yo male with multiple comorbid medical conditions (CKD 4, prior CABG, acute on chronic diastolic heart failure, COPD, persistent atrial fibrillation). He has developed severe, Stage D1, symptomatic aortic stenosis and presents for TAVR.  During the course of the patient's preoperative work up they have been evaluated comprehensively by a multidisciplinary team of specialists coordinated through the [facility] in the [facility].  They have been demonstrated to suffer from symptomatic severe aortic stenosis as noted above. The patient has been counseled extensively as to the relative risks and benefits of all options for the treatment of severe aortic stenosis including long term medical therapy, conventional surgery for aortic valve replacement, and transcatheter aortic valve replacement.  The patient has been independently evaluated by two cardiac surgeons including Dr. Roxy Manns and Dr. Prescott Gum, and they are felt to be at high risk for conventional surgical aortic valve replacement.  Based upon review of all of the patient's preoperative diagnostic tests they are felt to be candidate for  transcatheter aortic valve replacement using the transfemoral approach as an alternative to high risk conventional surgery.    Following the decision to proceed with transcatheter aortic valve replacement, a discussion has been held regarding what types of management strategies would be attempted intraoperatively in the event of life-threatening complications, including whether or not the patient would be considered a candidate for the use of cardiopulmonary bypass and/or conversion to open sternotomy for attempted surgical intervention.  The patient has been advised of a variety of complications that might develop peculiar to this approach including but not limited to risks of death, stroke, paravalvular leak, aortic dissection or other major vascular complications, aortic annulus rupture, device embolization, cardiac rupture or perforation, acute myocardial infarction, arrhythmia, heart block or bradycardia requiring permanent pacemaker placement, congestive heart failure, respiratory failure, renal failure, pneumonia, infection, other late complications related to structural valve deterioration or migration, or other complications that might ultimately cause a temporary or permanent loss of functional independence or other long term morbidity.  The patient provides full informed consent for the procedure as described and all questions were answered preoperatively.  DETAILS OF THE OPERATIVE PROCEDURE  PREPARATION:   The patient is brought to the operating room on the above mentioned date and central monitoring was established by the anesthesia team including placement of a central venous catheter and radial arterial line. The patient is placed in the supine position on the operating table.  Intravenous antibiotics are administered.  General endotracheal anesthesia is induced uneventfully.  A Foley catheter is placed.  Baseline transesophageal echocardiogram is performed. The patient's chest, abdomen, both  groins, and both lower extremities are prepared and draped in a sterile manner. A time out procedure is performed.   PERIPHERAL ACCESS:   Using ultrasound  guidance, femoral arterial and venous access is obtained with placement of 6 Fr sheaths on the left side.  A pigtail diagnostic catheter was passed through the femoral arterial sheath under fluoroscopic guidance into the aortic root.  A temporary transvenous pacemaker catheter was passed through the femoral venous sheath under fluoroscopic guidance into the right ventricle.  The pacemaker was tested to ensure stable lead placement and pacemaker capture. Aortic root angiography was performed in order to determine the optimal angiographic angle for valve deployment.  TRANSFEMORAL ACCESS:  A micropuncture technique is used to access the right femoral artery under fluoroscopic and ultrasound guidance.  2 Perclose devices are deployed at 10' and 2' positions to 'PreClose' the femoral artery. An 8 French sheath is placed and then an Amplatz Superstiff wire is advanced through the sheath. This is changed out for a 14 French transfemoral E-Sheath after progressively dilating over the Superstiff wire.  An AL2 catheter was used to direct a straight-tip exchange length wire across the native aortic valve into the left ventricle. This was exchanged out for a pigtail catheter and position was confirmed in the LV apex. Simultaneous LV and Ao pressures were recorded.  The pigtail catheter was exchanged for an Amplatz Extra-stiff wire in the LV apex.  Echocardiography was utilized to confirm appropriate wire position and no sign of entanglement in the mitral subvalvular apparatus.  TRANSCATHETER HEART VALVE DEPLOYMENT:  An Edwards Sapien 3 transcatheter heart valve (size 26 mm, model #9600TFX, serial #[id number]) was prepared and crimped per manufacturer's guidelines, and the proper orientation of the valve is confirmed on the [employer] delivery system. The valve  was advanced through the introducer sheath using normal technique until in an appropriate position in the abdominal aorta beyond the sheath tip. The balloon was then retracted and using the fine-tuning wheel was centered on the valve. The valve was then advanced across the aortic arch using appropriate flexion of the catheter. The valve was carefully positioned across the aortic valve annulus. The Commander catheter was retracted using normal technique. Once final position of the valve has been confirmed by angiographic assessment, the valve is deployed while temporarily holding ventilation and during rapid ventricular pacing to maintain systolic blood pressure < 50 mmHg and pulse pressure < 10 mmHg. The balloon inflation is held for >3 seconds after reaching full deployment volume. Once the balloon has fully deflated the balloon is retracted into the ascending aorta and valve function is assessed using echocardiography. There is felt to be mild (2 small jets) paravalvular leak and no central aortic insufficiency.  The patient's hemodynamic recovery following valve deployment is good.  The deployment balloon and guidewire are both removed. Echo demostrated acceptable post-procedural gradients, stable mitral valve function, and mild paravalvular aortic insufficiency.   PROCEDURE COMPLETION:  The sheath was removed and femoral artery closure is performed using the 2 previously deployed Perclose devices.  Protamine is administered once femoral arterial repair was complete. The site is clear with no evidence of bleeding or hematoma after the sutures are tightened. The temporary pacemaker, pigtail catheters and femoral sheaths were removed with manual pressure used for hemostasis.   The patient tolerated the procedure well and is transported to the surgical intensive care in stable condition. There were no immediate intraoperative complications. All sponge instrument and needle counts are verified correct at  completion of the operation.   The patient received a total of 35 mL of intravenous contrast during the procedure.   Sherren Mocha, MD 12/03/2017 5:09 PM

## 2017-12-03 NOTE — Progress Notes (Signed)
[name] note:  Family at bedside. Pt awake and hemodynamically stable. Tele: sinus rhythm HR 60 bpm, O2 sat 97%, BP 120's 50's FU in AM.  Cameron [name] 12/03/2017  5:43 PM

## 2017-12-03 NOTE — Anesthesia Preprocedure Evaluation (Addendum)
Anesthesia Evaluation  Patient identified by MRN, date of birth, ID band Patient awake    Reviewed: Allergy & Precautions, H&P , NPO status , Patient's Chart, lab work & pertinent test results  Airway Mallampati: II   Neck ROM: full    Dental   Pulmonary COPD, former smoker,    breath sounds clear to auscultation       Cardiovascular hypertension, + CAD, + Past MI and + CABG  + dysrhythmias Atrial Fibrillation + Valvular Problems/Murmurs AS  Rhythm:regular Rate:Normal  Severe AS.  Pt on plavix and xarelto.  S/p balloon coronary angioplasty 08/2017.   Neuro/Psych    GI/Hepatic   Endo/Other  diabetes, Poorly Controlled, Insulin Dependentobese  Renal/GU Renal InsufficiencyRenal disease     Musculoskeletal   Abdominal   Peds  Hematology   Anesthesia Other Findings   Reproductive/Obstetrics                            Anesthesia Physical Anesthesia Plan  ASA: III  Anesthesia Plan: MAC   Post-op Pain Management:    Induction: Intravenous  PONV Risk Score and Plan: 1 and Ondansetron, Dexamethasone, TIVA and Treatment may vary due to age or medical condition  Airway Management Planned: Simple Face Mask  Additional Equipment:   Intra-op Plan:   Post-operative Plan:   Informed Consent: I have reviewed the patients History and Physical, chart, labs and discussed the procedure including the risks, benefits and alternatives for the proposed anesthesia with the patient or authorized representative who has indicated his/her understanding and acceptance.     Plan Discussed with: CRNA, Anesthesiologist and Surgeon  Anesthesia Plan Comments:         Anesthesia Quick Evaluation

## 2017-12-03 NOTE — Interval H&P Note (Signed)
History and Physical Interval Note:  12/03/2017 1:20 PM  Cameron [name]  has presented today for surgery, with the diagnosis of severe aortic stenosis  The various methods of treatment have been discussed with the patient and family. After consideration of risks, benefits and other options for treatment, the patient has consented to  Procedure(s): TRANSCATHETER AORTIC VALVE REPLACEMENT, TRANSFEMORAL (N/A) TRANSESOPHAGEAL ECHOCARDIOGRAM (TEE) (N/A) as a surgical intervention .  The patient's history has been reviewed, patient examined, no change in status, stable for surgery.  I have reviewed the patient's chart and labs.  Questions were answered to the patient's satisfaction.    Pt denies any interval change in symptoms since the time of my last evaluation.  He continues to have fatigue and shortness of breath with exertion.  He has not had recurrent angina, nor has he required any sublingual nitroglycerin since the day of our office visit.  I have reviewed our plans for TAVR via a transfemoral approach.  He understands and agrees to proceed.  His clinical presentation is consistent with acute on chronic diastolic heart failure with progressive shortness of breath, NYHA functional class III symptoms, and elevated BNP.  Sherren Mocha, MD

## 2017-12-03 NOTE — Op Note (Signed)
[facility]   MULTIDISCIPLINARY HEART VALVE TEAM   TAVR OPERATIVE NOTE   Date of Procedure:  12/03/2017  Preoperative Diagnosis: Severe Aortic Stenosis   Postoperative Diagnosis: Same   Procedure:    Transcatheter Aortic Valve Replacement - Percutaneous Right Transfemoral Approach  Edwards Sapien 3 THV (size 26 mm, model # 9600TFX, serial # [id number])   Co-Surgeons:  Sherren Mocha, MD and Valentina Gu. Roxy Manns, MD   Anesthesiologist:  Albertha Ghee, MD  Echocardiographer:  Ena Dawley, MD  Pre-operative Echo Findings:  Severe aortic stenosis  Normal left ventricular systolic function  Post-operative Echo Findings:  Mild paravalvular leak  Normal left ventricular systolic function   BRIEF CLINICAL NOTE AND INDICATIONS FOR SURGERY  Patient is a 77 year old male with history of coronary artery disease status post coronary artery bypass grafting x4 in 2002 and recently status post PCI of patent but diseased saphenous vein graft to the diagonal branch, aortic stenosis, chronic diastolic congestive heart failure, hypertension, hyperlipidemia, stage III chronic kidney disease, COPD, chronic cough, right bundle branch block, persistent atrial fibrillation recently started on long-term anticoagulation, and diabetes mellitus who has been referred for surgical consultation to discuss treatment options for management of severe symptomatic aortic stenosis.  The patient's cardiac history dates back to 2002 when he presented with exertional angina.  He was found to have three-vessel coronary artery disease and underwent coronary artery bypass grafting x4.  His procedure was uncomplicated and he has done well from a cardiac standpoint until fairly recently.  For many years he was followed by Dr. Rex Kras, and more recently he has been followed by Dr. Einar Gip and Dr. Earnie Larsson.   Patient states that over the past year or 2 he has developed progressive symptoms of exertional chest  discomfort, shortness of breath, and fatigue.  This fall it got particularly severe with 3 somewhat prolonged episodes of chest discomfort and shortness of breath following exertion that developed since Labor Day.    On September 14, 2017 he developed severe chest pain at rest associated with weakness and fatigue.  He was hospitalized and ruled in for non-ST segment elevation myocardial infarction with serial cardiac enzymes increasing from 0.05-0.18.  He was noted to have mild pulmonary edema and mildly elevated BNP consistent with acute exacerbation of chronic diastolic congestive heart failure.  Follow-up transthoracic echocardiogram performed September 15, 2017 revealed normal left ventricular systolic function with ejection fraction estimated 55-60%.  There was severe aortic stenosis with peak velocity across the aortic valve measured 4.0 m/s corresponding to mean transvalvular gradient estimated 36 mmHg and DVI measured 0.26.  Initial plans for diagnostic catheterization were postponed due to acute exacerbation of chronic kidney disease, but once the patient's renal function stabilized the patient underwent catheterization on September 17, 2017.  Catheterization revealed severe native coronary artery disease with continued patency of all 4 previously placed bypass grafts but severe 90% focal stenosis of the saphenous vein graft to the diagonal branch.  There was mild pulmonary hypertension.  The patient was seen in consultation by Dr. Burt Knack who felt that it was reasonable to proceed with plans for PCI and stenting of the diseased vein graft to the diagonal branch followed by delayed evaluation for possible transcatheter aortic valve replacement.  On September 23, 2017 the patient underwent balloon angioplasty of the diseased vein graft to diagonal branch.  There was distal embolization in the native vessel with no reflow.  A stent was not placed.  During the patient's hospitalization he was also  noted to be in  atrial fibrillation with underlying right bundle branch block.  He was ultimately discharged home on Plavix and Xarelto.  Since hospital discharge patient underwent CT angiography and surgical consultation was requested.  During the course of the patient's preoperative work up they have been evaluated comprehensively by a multidisciplinary team of specialists coordinated through the [facility] in the [facility].  They have been demonstrated to suffer from symptomatic severe aortic stenosis as noted above. The patient has been counseled extensively as to the relative risks and benefits of all options for the treatment of severe aortic stenosis including long term medical therapy, conventional surgery for aortic valve replacement, and transcatheter aortic valve replacement.  All questions have been answered, and the patient provides full informed consent for the operation as described.   DETAILS OF THE OPERATIVE PROCEDURE  PREPARATION:    The patient is brought to the operating room on the above mentioned date and central monitoring was established by the anesthesia team including placement of a central venous line and radial arterial line. The patient is placed in the supine position on the operating table.  Intravenous antibiotics are administered.  General endotracheal anesthesia is induced uneventfully.  A Foley catheter is placed.  Baseline transesophageal echocardiogram was performed. The patient's chest, abdomen, both groins, and both lower extremities are prepared and draped in a sterile manner. A time out procedure is performed.   PERIPHERAL ACCESS:    Using the modified Seldinger technique, femoral arterial and venous access was obtained with placement of 6 Fr sheaths on the left side.  A pigtail diagnostic catheter was passed through the left arterial sheath under fluoroscopic guidance into the aortic root.  A temporary transvenous  pacemaker catheter was passed through the left femoral venous sheath under fluoroscopic guidance into the right ventricle.  The pacemaker was tested to ensure stable lead placement and pacemaker capture. Aortic root angiography was performed in order to determine the optimal angiographic angle for valve deployment.   TRANSFEMORAL ACCESS:   Percutaneous transfemoral access and sheath placement was performed by Dr. Burt Knack using ultrasound guidance.  The right common femoral artery was cannulated using a micropuncture needle and appropriate location was verified using hand injection angiogram.  A pair of Abbott Perclose percutaneous closure devices were placed and a 6 French sheath replaced into the femoral artery.  The patient was heparinized systemically and ACT verified > 250 seconds.    A 14 Fr transfemoral E-sheath was introduced into the right common femoral artery after progressively dilating over an Amplatz superstiff wire. An AL-1 catheter was used to direct a straight-tip exchange length wire across the native aortic valve into the left ventricle. This was exchanged out for a pigtail catheter and position was confirmed in the LV apex. Simultaneous LV and Ao pressures were recorded.  The pigtail catheter was exchanged for an Amplatz Extra-stiff wire in the LV apex.  Echocardiography was utilized to confirm appropriate wire position and no sign of entanglement in the mitral subvalvular apparatus.   TRANSCATHETER HEART VALVE DEPLOYMENT:   An Edwards Sapien 3 transcatheter heart valve (size 26 mm, model #9600TFX, serial #[id number]) was prepared and crimped per manufacturer's guidelines, and the proper orientation of the valve is confirmed on the [employer] delivery system. The valve was advanced through the introducer sheath using normal technique until in an appropriate position in the abdominal aorta beyond the sheath tip. The balloon was then retracted and using the  fine-tuning wheel was  centered on the valve. The valve was then advanced across the aortic arch using appropriate flexion of the catheter. The valve was carefully positioned across the aortic valve annulus. The Commander catheter was retracted using normal technique. Once final position of the valve has been confirmed by angiographic assessment, the valve is deployed while temporarily holding ventilation and during rapid ventricular pacing to maintain systolic blood pressure < 50 mmHg and pulse pressure < 10 mmHg. The balloon inflation is held for >3 seconds after reaching full deployment volume. Once the balloon has fully deflated the balloon is retracted into the ascending aorta and valve function is assessed using echocardiography. There is felt to be mild paravalvular leak and no central aortic insufficiency.  The patient's hemodynamic recovery following valve deployment is good.  The deployment balloon and guidewire are both removed.    PROCEDURE COMPLETION:   The sheath was removed and femoral artery closure performed by Dr Burt Knack.  Protamine was administered once femoral arterial repair was complete. The temporary pacemaker, pigtail catheters and femoral sheaths were removed with manual pressure used for hemostasis.   The patient tolerated the procedure well and is transported to the surgical intensive care in stable condition. There were no immediate intraoperative complications. All sponge instrument and needle counts are verified correct at completion of the operation.   No blood products were administered during the operation.  The patient received a total of 35.8 mL of intravenous contrast during the procedure.   Rexene Alberts, MD 12/03/2017 4:20 PM

## 2017-12-03 NOTE — Anesthesia Procedure Notes (Signed)
Central Venous Catheter Insertion Performed by: Albertha Ghee, MD, anesthesiologist Start/End2/02/2018 1:42 PM, 12/03/2017 1:52 PM Patient location: Pre-op. Preanesthetic checklist: patient identified, IV checked, site marked, risks and benefits discussed, surgical consent, monitors and equipment checked, pre-op evaluation, timeout performed and anesthesia consent Position: Trendelenburg Lidocaine 1% used for infiltration and patient sedated Hand hygiene performed , maximum sterile barriers used  and Seldinger technique used Catheter size: 7 Fr Central line was placed.Double lumen Procedure performed using ultrasound guided technique. Ultrasound Notes:anatomy identified, needle tip was noted to be adjacent to the nerve/plexus identified and no ultrasound evidence of intravascular and/or intraneural injection Attempts: 1 Following insertion, line sutured, dressing applied and Biopatch. Post procedure assessment: blood return through all ports, free fluid flow and no air  Patient tolerated the procedure well with no immediate complications.

## 2017-12-03 NOTE — Anesthesia Procedure Notes (Signed)
Procedure Name: Intubation Date/Time: 12/03/2017 2:35 PM Performed by: Moshe Salisbury, CRNA Pre-anesthesia Checklist: Patient identified, Emergency Drugs available, Suction available and Patient being monitored Patient Re-evaluated:Patient Re-evaluated prior to induction Oxygen Delivery Method: Circle System Utilized Preoxygenation: Pre-oxygenation with 100% oxygen Induction Type: IV induction Ventilation: Mask ventilation with difficulty and Two handed mask ventilation required Laryngoscope Size: 4 Grade View: Grade I Tube type: Oral Tube size: 8.0 mm Number of attempts: 1 Airway Equipment and Method: Stylet and Oral airway Placement Confirmation: ETT inserted through vocal cords under direct vision,  positive ETCO2 and breath sounds checked- equal and bilateral Secured at: 23 cm Tube secured with: Tape Dental Injury: Teeth and Oropharynx as per pre-operative assessment

## 2017-12-03 NOTE — Progress Notes (Signed)
  Echocardiogram 2D Echocardiogram has been performed.  Bobbye Charleston 12/03/2017, 4:09 PM

## 2017-12-04 ENCOUNTER — Encounter (HOSPITAL_COMMUNITY): Payer: Self-pay | Admitting: Cardiovascular Disease

## 2017-12-04 ENCOUNTER — Inpatient Hospital Stay (HOSPITAL_COMMUNITY): Payer: Medicare Other

## 2017-12-04 ENCOUNTER — Other Ambulatory Visit: Payer: Self-pay

## 2017-12-04 DIAGNOSIS — Z952 Presence of prosthetic heart valve: Secondary | ICD-10-CM

## 2017-12-04 DIAGNOSIS — I5033 Acute on chronic diastolic (congestive) heart failure: Secondary | ICD-10-CM

## 2017-12-04 DIAGNOSIS — I351 Nonrheumatic aortic (valve) insufficiency: Secondary | ICD-10-CM

## 2017-12-04 LAB — MAGNESIUM: Magnesium: 1.8 mg/dL (ref 1.7–2.4)

## 2017-12-04 LAB — ECHOCARDIOGRAM COMPLETE
HEIGHTINCHES: 69 in
Weight: 3428.59 oz

## 2017-12-04 LAB — GLUCOSE, CAPILLARY
GLUCOSE-CAPILLARY: 172 mg/dL — AB (ref 65–99)
GLUCOSE-CAPILLARY: 173 mg/dL — AB (ref 65–99)
GLUCOSE-CAPILLARY: 296 mg/dL — AB (ref 65–99)
Glucose-Capillary: 240 mg/dL — ABNORMAL HIGH (ref 65–99)
Glucose-Capillary: 322 mg/dL — ABNORMAL HIGH (ref 65–99)

## 2017-12-04 LAB — BASIC METABOLIC PANEL
ANION GAP: 12 (ref 5–15)
BUN: 44 mg/dL — ABNORMAL HIGH (ref 6–20)
CHLORIDE: 105 mmol/L (ref 101–111)
CO2: 22 mmol/L (ref 22–32)
CREATININE: 2.59 mg/dL — AB (ref 0.61–1.24)
Calcium: 8.6 mg/dL — ABNORMAL LOW (ref 8.9–10.3)
GFR calc Af Amer: 26 mL/min — ABNORMAL LOW (ref >60)
GFR calc non Af Amer: 22 mL/min — ABNORMAL LOW (ref >60)
GLUCOSE: 195 mg/dL — AB (ref 65–99)
Potassium: 4.4 mmol/L (ref 3.5–5.1)
Sodium: 139 mmol/L (ref 135–145)

## 2017-12-04 LAB — CBC
HEMATOCRIT: 42.2 % (ref 39.0–52.0)
HEMOGLOBIN: 13.7 g/dL (ref 13.0–17.0)
MCH: 27.2 pg (ref 26.0–34.0)
MCHC: 32.5 g/dL (ref 30.0–36.0)
MCV: 83.7 fL (ref 78.0–100.0)
Platelets: 122 10*3/uL — ABNORMAL LOW (ref 150–400)
RBC: 5.04 MIL/uL (ref 4.22–5.81)
RDW: 14.1 % (ref 11.5–15.5)
WBC: 11.3 10*3/uL — ABNORMAL HIGH (ref 4.0–10.5)

## 2017-12-04 MED ORDER — ISOSORBIDE MONONITRATE ER 60 MG PO TB24
120.0000 mg | ORAL_TABLET | Freq: Every morning | ORAL | Status: DC
  Administered 2017-12-04 – 2017-12-05 (×2): 120 mg via ORAL
  Filled 2017-12-04 (×2): qty 2

## 2017-12-04 MED ORDER — MUPIROCIN 2 % EX OINT
TOPICAL_OINTMENT | Freq: Two times a day (BID) | CUTANEOUS | Status: DC
  Administered 2017-12-04 (×2): via NASAL
  Filled 2017-12-04 (×2): qty 22

## 2017-12-04 MED ORDER — RIVAROXABAN 15 MG PO TABS
15.0000 mg | ORAL_TABLET | Freq: Every day | ORAL | Status: DC
  Administered 2017-12-04: 15 mg via ORAL
  Filled 2017-12-04: qty 1

## 2017-12-04 MED ORDER — ISOSORBIDE MONONITRATE ER 60 MG PO TB24
60.0000 mg | ORAL_TABLET | Freq: Every day | ORAL | Status: DC
  Administered 2017-12-04: 60 mg via ORAL
  Filled 2017-12-04: qty 1

## 2017-12-04 MED ORDER — CHLORHEXIDINE GLUCONATE CLOTH 2 % EX PADS
6.0000 | MEDICATED_PAD | Freq: Every day | CUTANEOUS | Status: DC
  Administered 2017-12-04: 6 via TOPICAL

## 2017-12-04 MED ORDER — ISOSORBIDE MONONITRATE ER 60 MG PO TB24: 60.0000 mg | ORAL_TABLET | Freq: Two times a day (BID) | ORAL | Status: DC

## 2017-12-04 MED ORDER — PERFLUTREN LIPID MICROSPHERE
1.0000 mL | INTRAVENOUS | Status: AC | PRN
  Administered 2017-12-04: 2 mL via INTRAVENOUS
  Filled 2017-12-04 (×2): qty 10

## 2017-12-04 MED ORDER — METOPROLOL SUCCINATE ER 100 MG PO TB24
100.0000 mg | ORAL_TABLET | Freq: Every day | ORAL | Status: DC
  Administered 2017-12-04: 100 mg via ORAL
  Filled 2017-12-04: qty 2

## 2017-12-04 MED ORDER — CITALOPRAM HYDROBROMIDE 10 MG PO TABS
10.0000 mg | ORAL_TABLET | Freq: Every day | ORAL | Status: DC
  Administered 2017-12-04 – 2017-12-05 (×2): 10 mg via ORAL
  Filled 2017-12-04 (×2): qty 1

## 2017-12-04 MED ORDER — FUROSEMIDE 10 MG/ML IJ SOLN
80.0000 mg | Freq: Once | INTRAMUSCULAR | Status: AC
  Administered 2017-12-04: 80 mg via INTRAVENOUS
  Filled 2017-12-04: qty 8

## 2017-12-04 MED ORDER — AMLODIPINE BESYLATE 5 MG PO TABS
5.0000 mg | ORAL_TABLET | Freq: Every day | ORAL | Status: DC
  Administered 2017-12-04 – 2017-12-05 (×2): 5 mg via ORAL
  Filled 2017-12-04 (×2): qty 1

## 2017-12-04 MED ORDER — SODIUM CHLORIDE 0.9 % IV SOLN: INTRAVENOUS | Status: DC

## 2017-12-04 NOTE — Anesthesia Postprocedure Evaluation (Signed)
Anesthesia Post Note  Patient: Cameron [name]  Procedure(s) Performed: TRANSCATHETER AORTIC VALVE REPLACEMENT, TRANSFEMORAL (N/A Chest) TRANSESOPHAGEAL ECHOCARDIOGRAM (TEE) (N/A Chest)     Patient location during evaluation: ICU Anesthesia Type: General Level of consciousness: awake and alert Pain management: pain level controlled Vital Signs Assessment: post-procedure vital signs reviewed and stable Respiratory status: spontaneous breathing, nonlabored ventilation, respiratory function stable and patient connected to nasal cannula oxygen Cardiovascular status: blood pressure returned to baseline and stable Postop Assessment: no apparent nausea or vomiting Anesthetic complications: no    Last Vitals:  Vitals:   12/04/17 0930 12/04/17 1000  BP: (!) 162/58 (!) 160/74  Pulse: 77 85  Resp: 19 (!) 21  Temp:    SpO2: 96% 98%    Last Pain:  Vitals:   12/04/17 0950  TempSrc:   PainSc: 0-No pain                 Shiana Rappleye S

## 2017-12-04 NOTE — Progress Notes (Signed)
  Echocardiogram 2D Echocardiogram has been performed.  Cameron [name] 12/04/2017, 11:21 AM

## 2017-12-04 NOTE — Care Management Note (Signed)
Case Management Note  Patient Details  Name: Cameron [name] MRN: [id number] Date of Birth: [birth year]  Subjective/Objective:   From home with wife, pta indep, wife will be able to assist 24/7 per patient.  He did not use any DME at home pta . He has been ambulating on unit. He has PCP and medication coverage.                  Action/Plan: NCM will follow for dc needs.   Expected Discharge Date:                  Expected Discharge Plan:  Home/Self Care  In-House Referral:     Discharge planning Services  CM Consult  Post Acute Care Choice:    Choice offered to:     DME Arranged:    DME Agency:     HH Arranged:    HH Agency:     Status of Service:  In process, will continue to follow  If discussed at Long Length of Stay Meetings, dates discussed:    Additional Comments:  [name], [name], RN 12/04/2017, 1:33 PM

## 2017-12-04 NOTE — Progress Notes (Addendum)
[facility]  Patient Name: Cameron [name] Date of Encounter: 12/04/2017  Primary Cardiologist: Dr. Virgina Jock / Dr. Burt Knack & Dr. Roxy Manns (TAVR)  Hospital Problem List     Principal Problem:   S/P TAVR (transcatheter aortic valve replacement) Active Problems:   Diabetes mellitus type 2, uncontrolled ([facility])   Acute on chronic diastolic heart failure (HCC)   Severe aortic stenosis   CKD (chronic kidney disease) stage 4, GFR 15-29 ml/min (HCC)   Obesity   COPD (chronic obstructive pulmonary disease) (HCC)   CKD (chronic kidney disease)   CAD (coronary artery disease)   PAF (paroxysmal atrial fibrillation) (HCC)   RBBB     Subjective   Up sitting in chair. No complaints. Feeling the best he has felt in months.   Inpatient Medications    Scheduled Meds: . acetaminophen  1,000 mg Oral Q6H   Or  . acetaminophen (TYLENOL) oral liquid 160 mg/5 mL  1,000 mg Per Tube Q6H  . amLODipine  5 mg Oral Daily  . aspirin EC  81 mg Oral Daily   Or  . aspirin  81 mg Per Tube Daily  . Chlorhexidine Gluconate Cloth  6 each Topical Daily  . furosemide  80 mg Intravenous Once  . insulin aspart  0-20 Units Subcutaneous TID WC  . isosorbide mononitrate  60-120 mg Oral BID  . metoprolol succinate  100 mg Oral QPC supper  . pantoprazole  40 mg Oral Daily  . rosuvastatin  40 mg Oral QPC supper  . sodium chloride flush  10-40 mL Intracatheter Q12H   Continuous Infusions: . sodium chloride 10 mL/hr at 12/04/17 0800  . albumin human    . cefUROXime (ZINACEF)  IV Stopped (12/04/17 0201)  . nitroGLYCERIN 15 mcg/min (12/04/17 0844)  . phenylephrine (NEO-SYNEPHRINE) Adult infusion Stopped (12/03/17 1657)  . vancomycin     PRN Meds: albumin human, metoprolol tartrate, midazolam, morphine injection, morphine injection, ondansetron (ZOFRAN) IV, oxyCODONE, sodium chloride flush, traMADol   Vital Signs    Vitals:   12/04/17 0800 12/04/17 0815 12/04/17  0830 12/04/17 0845  BP: (!) 164/60  (!) 171/71   Pulse: 76 78 78 90  Resp: 15 (!) 22 (!) 21 20  Temp:      TempSrc:      SpO2: 100% 98% 100% 96%  Weight:      Height:        Intake/Output Summary (Last 24 hours) at 12/04/2017 0903 Last data filed at 12/04/2017 0800 Gross per 24 hour  Intake 2614.56 ml  Output 625 ml  Net 1989.56 ml   Filed Weights   12/03/17 1102 12/04/17 0500 12/04/17 0754  Weight: 213 lb (96.6 kg) 214 lb 4.8 oz (97.2 kg) 214 lb 4.6 oz (97.2 kg)    Physical Exam   GEN: Well nourished, well developed, in no acute distress. Obese, sitting up in chair HEENT: Grossly normal.  Neck: Supple, no JVD, carotid bruits, or masses. Cardiac: RRR, no murmurs, rubs, or gallops. No clubbing, cyanosis, edema.  Radials/DP/PT 2+ and equal bilaterally.  Respiratory:  Mild crackles on exam GI: Soft, nontender, nondistended, BS + x 4. MS: no deformity or atrophy. Skin: warm and dry, no rash. Groin sites are stable Neuro:  Strength and sensation are intact. Psych: AAOx3.  Normal affect.  Labs    CBC Recent Labs    12/03/17 1634 12/03/17 1647 12/04/17 0359  WBC 9.5  --  11.3*  HGB 14.8 14.6  13.7  HCT 44.7 43.0 42.2  MCV 84.3  --  83.7  PLT 127*  --  073*   Basic Metabolic Panel Recent Labs    12/03/17 1557 12/03/17 1647 12/04/17 0359  NA 141 145 139  K 3.6 3.7 4.4  CL 102  --  105  CO2  --   --  22  GLUCOSE 98 99 195*  BUN 39*  --  44*  CREATININE 2.50*  --  2.59*  CALCIUM  --   --  8.6*  MG  --   --  1.8   Liver Function Tests No results for input(s): AST, ALT, ALKPHOS, BILITOT, PROT, ALBUMIN in the last 72 hours. No results for input(s): LIPASE, AMYLASE in the last 72 hours. Cardiac Enzymes No results for input(s): CKTOTAL, CKMB, CKMBINDEX, TROPONINI in the last 72 hours. BNP Invalid input(s): POCBNP D-Dimer No results for input(s): DDIMER in the last 72 hours. Hemoglobin A1C No results for input(s): HGBA1C in the last 72 hours. Fasting Lipid  Panel No results for input(s): CHOL, HDL, LDLCALC, TRIG, CHOLHDL, LDLDIRECT in the last 72 hours. Thyroid Function Tests No results for input(s): TSH, T4TOTAL, T3FREE, THYROIDAB in the last 72 hours.  Invalid input(s): FREET3  Telemetry    Sinus with few PVCs - Personally Reviewed  ECG    Sinus with RBBB, PVC - Personally Reviewed  Radiology    Dg Chest Port 1 View  Result Date: 12/03/2017 CLINICAL DATA:  Status post transcatheter aortic valve replacement. EXAM: PORTABLE CHEST 1 VIEW COMPARISON:  Radiographs of November 29, 2017. FINDINGS: Stable cardiomegaly. Status post coronary artery bypass graft. Aortic valve prosthesis is noted. No pneumothorax is noted or significant pleural effusion is noted. Stable bibasilar subsegmental atelectasis or scarring is noted. Right internal jugular catheter is noted with tip in expected position of the SVC. Old right rib fractures are noted. IMPRESSION: Stable bibasilar subsegmental atelectasis or scarring. Electronically Signed   By: Marijo Conception, M.D.   On: 12/03/2017 17:15    Cardiac Studies  TAVR OPERATIVE NOTE   Date of Procedure:                12/03/2017  Preoperative Diagnosis:      Severe Aortic Stenosis   Postoperative Diagnosis:    Same   Procedure:        Transcatheter Aortic Valve Replacement - Percutaneous Right Transfemoral Approach             Edwards Sapien 3 THV (size 26 mm, model # 9600TFX, serial # [id number])              Co-Surgeons:                        Sherren Mocha, MD and Valentina Gu. Roxy Manns, MD   Anesthesiologist:                  Albertha Ghee, MD  Echocardiographer:              Ena Dawley, MD  Pre-operative Echo Findings: ? Severe aortic stenosis ? Normal left ventricular systolic function  Post-operative Echo Findings: ? Mild paravalvular leak ? Normal left ventricular systolic function  ______________  Post operative echo 12/04/17    Patient Profile     Cameron [name] is a 77  y.o. male with a history of CAD s/p CABG x4V (2002) and recent PCTA to SVG--> diag, chronic diastolic CHF, HTN, HLD, CKD stage III, COPD,  RBBB, chronic cough, PAF on Xarelto, DMT2 and severe AS who presented to [facility] on 12[facility]03[facility]17 for planned TAVR.   Assessment & Plan    Severe AS: s/p successful TAVR with a 26 mm Edwards Sapien THV via the R TF approach on 12/03/17. Post operative echo pending today. Groin sites are stable. ECG with no high grade block. Continue ASA 81mg  daily. Will resume home Xarelto 15 mg daily tonight. Plan to remove central line and transfer to floor once of IV nitro. Mobilize with hopeful discharge home tomorrow.   HTN: BP has been elevated and he is currently on IV NTG. I will resume his home imdur, amlodipine and Toprol XL. Plan to wean off nitro and remove central line.   CAD: s/p CABG x4V (2002) and recent failed PCTA to SVG--> diag. This seems to be stable; he has not had recent angina. Continue medical therapy. Plavix switched on ASA given concomitant Xarelto use.   CKD: creat around baseline ~2.5. He was treated with IV fluids overnight. Will continue to monitor  Acute on chronic diastolic CHF: pre op admission labs showed elevated BNP. He has some post op volume overload with crackles on exam and he is being treated with lasix IV 80mg  x1 today. We can likely resume his home lasix tomorrow.   DMT2: continue SSI while admitted.   SignedAngelena Form, PA-C  12/04/2017, 9:03 AM  Pager [phone]  Patient seen, examined. Available data reviewed. Agree with findings, assessment, and plan as outlined by Nell Range, PA-C. Pt seen this am. Feeling well overall, sitting up in chair. Exam as above with mild rales in the bases otherwise clear lungs. CV: RRR no murmur. R groin clear, left groin with mild ecchymoses. Agree with resuming his oral antihypertensives, diuresing with IV lasix, follow creatinine closely in setting CKD, mobilize and tx tele. Echo reviewed and shows  normal THV function. Home tomorrow if remains stable.   Sherren Mocha, M.D. 12/04/2017 10:15 PM

## 2017-12-05 LAB — BASIC METABOLIC PANEL
Anion gap: 13 (ref 5–15)
BUN: 51 mg/dL — AB (ref 6–20)
CALCIUM: 8.5 mg/dL — AB (ref 8.9–10.3)
CO2: 24 mmol/L (ref 22–32)
Chloride: 100 mmol/L — ABNORMAL LOW (ref 101–111)
Creatinine, Ser: 2.92 mg/dL — ABNORMAL HIGH (ref 0.61–1.24)
GFR calc Af Amer: 22 mL/min — ABNORMAL LOW (ref >60)
GFR, EST NON AFRICAN AMERICAN: 19 mL/min — AB (ref >60)
Glucose, Bld: 344 mg/dL — ABNORMAL HIGH (ref 65–99)
Potassium: 3.8 mmol/L (ref 3.5–5.1)
Sodium: 137 mmol/L (ref 135–145)

## 2017-12-05 LAB — GLUCOSE, CAPILLARY: Glucose-Capillary: 311 mg/dL — ABNORMAL HIGH (ref 65–99)

## 2017-12-05 MED ORDER — ASPIRIN 81 MG PO TBEC: 81.0000 mg | DELAYED_RELEASE_TABLET | Freq: Every day | ORAL | Status: AC

## 2017-12-05 NOTE — Discharge Instructions (Signed)
ACTIVITY AND EXERCISE  Daily activity and exercise are an important part of your recovery. People recover at different rates depending on their general health and type of valve procedure.  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             - For six weeks for procedures through the chest wall.             - For three months for procedures through the breast-bone. NOTE: You will typically see one of our providers 7-10 days after your procedure to discuss [place] the above activities.    DRIVING  Do not drive for until you are seen for follow up and cleared by a provider.  If you have been told by your doctor in the past that you may not drive, you must talk with him/her before you begin driving again.   DRESSING  Groin site: you may leave the clear dressing over the site for up to one week or until it falls off.   HYGIENE  If you had a femoral (leg) procedure, you may take a shower when you return home. After the shower, pat the site dry. Do NOT use powder, oils or lotions in your groin area until the site has completely healed.  If you had a chest procedure, you may shower when you return home unless specifically instructed not to by your discharging practitioner.             - DO NOT scrub incision; pat dry with a towel             - DO NOT apply any lotions, oils, powders to the incision             - No tub baths / swimming for at least 2 weeks.  If you notice any fevers, chills, increased pain, swelling, bleeding or pus, please contact your doctor.  ADDITIONAL INFORMATION  If you are going to have an upcoming dental procedure, please contact our office as you will require antibiotics ahead of time to prevent infection on your heart valve.      Diabetes  [place] and Nutrition When you have diabetes (diabetes mellitus), it is very important to have healthy eating habits because your blood sugar (glucose) levels are greatly affected by what you eat and drink. Eating healthy foods in the appropriate amounts, at about the same times every day, can help you:  Control your blood glucose.  Lower your risk of heart disease.  Improve your blood pressure.  Reach or maintain a healthy weight.  Every person with diabetes is different, and each person has different needs for a meal plan. Your health care provider may recommend that you work with a diet and nutrition specialist (dietitian) to make a meal plan that is best for you. Your meal plan may vary depending on factors such as:  The calories you need.  The medicines you take.  Your weight.  Your blood glucose, blood pressure, and cholesterol levels.  Your activity level.  Other health conditions you have, such as heart or kidney disease.  How do carbohydrates affect me? Carbohydrates affect your blood glucose level more than any other type of food. Eating carbohydrates naturally increases the amount of  glucose in your blood. Carbohydrate counting is a method for keeping track of how many carbohydrates you eat. Counting carbohydrates is important to keep your blood glucose at a healthy level, especially if you use insulin or take certain oral diabetes medicines. It is important to know how many carbohydrates you can safely have in each meal. This is different for every person. Your dietitian can help you calculate how many carbohydrates you should have at each meal and for snack. Foods that contain carbohydrates include:  Bread, cereal, rice, pasta, and crackers.  Potatoes and corn.  Peas, beans, and lentils.  Milk and yogurt.  Fruit and juice.  Desserts, such as cakes, cookies, ice cream, and candy.  How does alcohol affect me? Alcohol can cause a sudden decrease in blood glucose  (hypoglycemia), especially if you use insulin or take certain oral diabetes medicines. Hypoglycemia can be a life-threatening condition. Symptoms of hypoglycemia (sleepiness, dizziness, and confusion) are similar to symptoms of having too much alcohol. If your health care provider says that alcohol is safe for you, follow these guidelines:  Limit alcohol intake to no more than 1 drink per day for nonpregnant women and 2 drinks per day for men. One drink equals 12 oz of beer, 5 oz of wine, or 1 oz of hard liquor.  Do not drink on an empty stomach.  Keep yourself hydrated with water, diet soda, or unsweetened iced tea.  Keep in mind that regular soda, juice, and other mixers may contain a lot of sugar and must be counted as carbohydrates.  What are tips for following this plan? Reading food labels  Start by checking the serving size on the label. The amount of calories, carbohydrates, fats, and other nutrients listed on the label are based on one serving of the food. Many foods contain more than one serving per package.  Check the total grams (g) of carbohydrates in one serving. You can calculate the number of servings of carbohydrates in one serving by dividing the total carbohydrates by 15. For example, if a food has 30 g of total carbohydrates, it would be equal to 2 servings of carbohydrates.  Check the number of grams (g) of saturated and trans fats in one serving. Choose foods that have low or no amount of these fats.  Check the number of milligrams (mg) of sodium in one serving. Most people should limit total sodium intake to less than 2,300 mg per day.  Always check the nutrition information of foods labeled as "low-fat" or "nonfat". These foods may be higher in added sugar or refined carbohydrates and should be avoided.  Talk to your dietitian to identify your daily goals for nutrients listed on the label. Shopping  Avoid buying canned, premade, or processed foods. These foods tend  to be high in fat, sodium, and added sugar.  Shop around the outside edge of the grocery store. This includes fresh fruits and vegetables, bulk grains, fresh meats, and fresh dairy. Cooking  Use low-heat cooking methods, such as baking, instead of high-heat cooking methods like deep frying.  Cook using healthy oils, such as olive, canola, or sunflower oil.  Avoid cooking with butter, cream, or high-fat meats. Meal planning  Eat meals and snacks regularly, preferably at the same times every day. Avoid going long periods of time without eating.  Eat foods high in fiber, such as fresh fruits, vegetables, beans, and whole grains. Talk to your dietitian about how many servings of carbohydrates you can eat at each  meal.  Eat 4-6 ounces of lean protein each day, such as lean meat, chicken, fish, eggs, or tofu. 1 ounce is equal to 1 ounce of meat, chicken, or fish, 1 egg, or 1/4 cup of tofu.  Eat some foods each day that contain healthy fats, such as avocado, nuts, seeds, and fish. Lifestyle   Check your blood glucose regularly.  Exercise at least 30 minutes 5 or more days each week, or as told by your health care provider.  Take medicines as told by your health care provider.  Do not use any products that contain nicotine or tobacco, such as cigarettes and e-cigarettes. If you need help quitting, ask your health care provider.  Work with a Social worker or diabetes educator to identify strategies to manage stress and any emotional and social challenges. What are some questions to ask my health care provider?  Do I need to meet with a diabetes educator?  Do I need to meet with a dietitian?  What number can I call if I have questions?  When are the best times to check my blood glucose? Where to find more information:  American Diabetes Association: diabetes.org/food-and-fitness/food  Academy of Nutrition and Dietetics:  PokerClues.dk  [employer] of Diabetes and Digestive and Kidney Diseases (NIH): ContactWire.be Summary  A healthy meal plan will help you control your blood glucose and maintain a healthy lifestyle.  Working with a diet and nutrition specialist (dietitian) can help you make a meal plan that is best for you.  Keep in mind that carbohydrates and alcohol have immediate effects on your blood glucose levels. It is important to count carbohydrates and to use alcohol carefully. This information is not intended to replace advice given to you by your health care provider. Make sure you discuss any questions you have with your health care provider. Document Released: 07/12/2005 Document Revised: 11/19/2016 Document Reviewed: 11/19/2016 Elsevier Interactive Patient Education  [employer].

## 2017-12-05 NOTE — Plan of Care (Signed)
Discharged to home. Education completed with pt and family.

## 2017-12-05 NOTE — Discharge Summary (Signed)
[facility]VE TEAM   Discharge Summary    Patient ID: Cameron [name],  MRN: [id number], DOB/AGE: 1941/06/10 77 y.o.  Admit date: 12/03/2017 Discharge date: 12/05/2017  Primary Care Provider: Parke Poisson Primary Cardiologist: Dr. Virgina Jock / Dr. Burt Knack & Dr. Roxy Manns (TAVR)   Discharge Diagnoses    Principal Problem:   S/P TAVR (transcatheter aortic valve replacement) Active Problems:   Diabetes mellitus type 2, uncontrolled ([place])   Acute on chronic diastolic heart failure (HCC)   Severe aortic stenosis   CKD (chronic kidney disease) stage 4, GFR 15-29 ml/min (HCC)   Obesity   COPD (chronic obstructive pulmonary disease) (HCC)   CKD (chronic kidney disease)   CAD (coronary artery disease)   PAF (paroxysmal atrial fibrillation) (HCC)   RBBB   Allergies Allergies  Allergen Reactions  . Pravastatin Other (See Comments)    myalgias  . Cefuroxime Diarrhea  . Ezetimibe Diarrhea     History of Present Illness     Cameron [name] is a 77 y.o. male with a history of CAD s/p CABG x4V (2002) and recent [place] to SVG--> diag, chronic diastolic CHF, HTN, HLD, CKD stage III, COPD, RBBB, chronic cough, PAF on Xarelto, DMT2 and severe AS who presented to [facility] on 12/03/17 for planned TAVR.  The patient's cardiac history dates back to 2002 when he presented with exertional angina.  He was found to have three-vessel coronary artery disease and underwent coronary artery bypass grafting x4.  His procedure was uncomplicated and he has done well from a cardiac standpoint until fairly recently.  For many years he was followed by Dr. Rex Kras, and more recently he has been followed by Dr. Einar Gip and Dr. Earnie Larsson.   Patient states that over the past year or 2 he has developed progressive symptoms of exertional chest discomfort, shortness of breath, and fatigue.  This fall it got particularly severe with 3 somewhat prolonged episodes of chest discomfort and  shortness of breath following exertion that developed since Labor Day.    On September 14, 2017 he developed severe chest pain at rest associated with weakness and fatigue.  He was hospitalized and ruled in for non-ST segment elevation myocardial infarction with serial cardiac enzymes increasing from 0.05-0.18.  He was noted to have mild pulmonary edema and mildly elevated BNP consistent with acute exacerbation of chronic diastolic congestive heart failure.  Follow-up transthoracic echocardiogram performed September 15, 2017 revealed normal left ventricular systolic function with ejection fraction estimated 55-60%.  There was severe aortic stenosis with peak velocity across the aortic valve measured 4.0 m/s corresponding to mean transvalvular gradient estimated 36 mmHg and DVI measured 0.26.  Initial plans for diagnostic catheterization were postponed due to acute exacerbation of chronic kidney disease, but once the patient's renal function stabilized the patient underwent catheterization on September 17, 2017.  Catheterization revealed severe native coronary artery disease with continued patency of all 4 previously placed bypass grafts but severe 90% focal stenosis of the saphenous vein graft to the diagonal branch.  There was mild pulmonary hypertension.  The patient was seen in consultation by Dr. Burt Knack who felt that it was reasonable to proceed with plans for PCI and stenting of the diseased vein graft to the diagonal branch followed by delayed evaluation for possible transcatheter aortic valve replacement.  On September 23, 2017 the patient underwent balloon angioplasty of the diseased vein graft to diagonal branch.  There was distal embolization in the native vessel  with no reflow.  A stent was not placed.  During the patient's hospitalization he was also noted to be in atrial fibrillation with underlying right bundle branch block.  He was ultimately discharged home on Plavix and Xarelto.    After discharge  he was seen back by the mutlidisiplinary valve team and felt to be a suitable candidate for TAVR, which was set up for 12/03/17.   Hospital Course     Consultants: none  Severe AS: s/p successful TAVR with a 26 mm Edwards Sapien THV via the R TF approach on 12/03/17. Post operative echo showed normal THV function with a mean/peak gradient of 11/24 mm Hg. Groin sites are stable. ECG with no high grade block. Continue ASA 81mg  daily and home Xarelto 15 mg daily. He will be discharged home today with 1 week TOC visit.   HTN: BP improved with resumption of home medications.   CAD: s/p CABG x4V (2002) and recent failed PCTA to SVG--> diag. This seems to be stable; he has not had recent angina. Continue medical therapy. Plavix switched on ASA given concomitant Xarelto use and no stent placement in 08/2017.  CKD: creat baseline ~2.5. Creat bumped to 2.92 today. I will resume home lasix dosing of 40mg  BID and check a BMET next week at follow up.   Acute on chronic diastolic CHF: pre op admission labs showed elevated BNP. He has some post op volume overload with crackles on exam and was treated with lasix IV 80mg  x1. Will resume home lasix dosing of 40mg  BID today. His symptoms have already dramatically improved s/p TAVR.  DMT2: uncontrolled. Continue home regimen. Follow up with PCP    The patient has had an uncomplicated hospital course and is recovering well. The femoral catheter sites are stable. He has been seen by Dr. Burt Knack today and deemed ready for discharge home. All follow-up appointments have been scheduled. Discharge medications are listed below.  _____________  Discharge Vitals Blood pressure (!) 144/65, pulse 71, temperature 98 F (36.7 C), temperature source Oral, resp. rate (!) 21, height 5\' 9"  (1.753 m), weight 209 lb 14.4 oz (95.2 kg), SpO2 93 %.  Filed Weights   12/04/17 0500 12/04/17 0754 12/05/17 0300  Weight: 214 lb 4.8 oz (97.2 kg) 214 lb 4.6 oz (97.2 kg) 209 lb 14.4 oz  (95.2 kg)    VS:  BP (!) 144/65 (BP Location: Left Arm)   Pulse 71   Temp 98 F (36.7 C) (Oral)   Resp (!) 21   Ht 5\' 9"  (1.753 m)   Wt 209 lb 14.4 oz (95.2 kg)   SpO2 93%   BMI 31.00 kg/m    GEN: Well nourished, well developed, in no acute distress HEENT: normal  Neck: no JVD, carotid bruits, or masses Cardiac: RRR; no murmurs, rubs, or gallops,no edema  Respiratory:  clear to auscultation bilaterally, normal work of breathing GI: soft, nontender, nondistended, + BS MS: no deformity or atrophy  Skin: warm and dry, no rash. Groin sites stable.  Neuro:  Alert and Oriented x 3, Strength and sensation are intact Psych: euthymic mood, full affect   Labs & Radiologic Studies     CBC Recent Labs    12/03/17 1634 12/03/17 1647 12/04/17 0359  WBC 9.5  --  11.3*  HGB 14.8 14.6 13.7  HCT 44.7 43.0 42.2  MCV 84.3  --  83.7  PLT 127*  --  557*   Basic Metabolic Panel Recent Labs    12/04/17 0359  12/05/17 0325  NA 139 137  K 4.4 3.8  CL 105 100*  CO2 22 24  GLUCOSE 195* 344*  BUN 44* 51*  CREATININE 2.59* 2.92*  CALCIUM 8.6* 8.5*  MG 1.8  --    Liver Function Tests No results for input(s): AST, ALT, ALKPHOS, BILITOT, PROT, ALBUMIN in the last 72 hours. No results for input(s): LIPASE, AMYLASE in the last 72 hours. Cardiac Enzymes No results for input(s): CKTOTAL, CKMB, CKMBINDEX, TROPONINI in the last 72 hours. BNP Invalid input(s): POCBNP D-Dimer No results for input(s): DDIMER in the last 72 hours. Hemoglobin A1C No results for input(s): HGBA1C in the last 72 hours. Fasting Lipid Panel No results for input(s): CHOL, HDL, LDLCALC, TRIG, CHOLHDL, LDLDIRECT in the last 72 hours. Thyroid Function Tests No results for input(s): TSH, T4TOTAL, T3FREE, THYROIDAB in the last 72 hours.  Invalid input(s): FREET3  Dg Chest 2 View  Result Date: 11/29/2017 CLINICAL DATA:  Preop exam EXAM: CHEST  2 VIEW COMPARISON:  11/14/2016 FINDINGS: Prior CABG. Heart is borderline  in size. Scarring in the lung bases. No effusions or acute bony abnormality. IMPRESSION: Borderline heart size. Scarring in the lung bases. No active disease. Electronically Signed   By: Rolm Baptise M.D.   On: 11/29/2017 16:12   Ct Coronary Morph W/cta Cor W/score W/ca W/cm &/or Wo/cm  Addendum Date: 11/20/2017   ADDENDUM REPORT: 11/20/2017 14:08 EXAM: OVER-READ INTERPRETATION  CT CHEST The following report is an over-read performed by radiologist Dr. Rebekah Chesterfield [facility] on 11/20/2017. This over-read does not include interpretation of cardiac or coronary anatomy or pathology. The cardiac CTA interpretation by the cardiologist is attached. COMPARISON:  None. FINDINGS: Extracardiac findings will be described separately under dictation for contemporaneously obtained CTA chest, abdomen and pelvis. IMPRESSION: Please see separate dictation for contemporaneously obtained CTA chest, abdomen and pelvis dated 11/20/2017 for full description of relevant extracardiac findings. Electronically Signed   By: Vinnie Langton M.D.   On: 11/20/2017 14:08   Result Date: 11/20/2017 CLINICAL DATA:  Aortic Stenosis EXAM: Cardiac TAVR CT TECHNIQUE: The patient was scanned on a Siemens Force 102 slice scanner. A 120 kV retrospective scan was triggered in the ascending thoracic aorta at 140 HU's. Gantry rotation speed was 250 msecs and collimation was .6 mm. No beta blockade or nitro were given. The 3D data set was reconstructed in 5% intervals of the R-R cycle. Systolic and diastolic phases were analyzed on a dedicated work station using MPR, MIP and VRT modes. The patient received 80 cc of contrast. FINDINGS: Aortic Valve: Tri leaflet Severely calcified with restricted leaflet motion. Significant annular calcification extending into the inter valvular fibrosa and mitral annular calcification Aorta: Moderate calcific atherosclerosis of the arch and descending thoracic aorta Sino-tubular Junction: 2.8 cm  Ascending Thoracic Aorta: 3.3 cm Aortic Arch: 2.2 cm Descending Thoracic Aorta: 2.2 cm Sinus of Valsalva Measurements: Non-coronary: 29.8 mm Right - coronary: 29.5 mm Left -   coronary: 29.8 mm Coronary Artery Height above Annulus: Left Main: 11.6 mm above annulus Right Coronary: 11.3 mm above annulus Virtual Basal Annulus Measurements: Maximum / Minimum Diameter: 20.6 mm x 27.2 mm Perimeter: 77.4 mm Area: 482 mm2 Coronary Arteries: Sufficient height above annulus for deployment Patent SVG;s to RCA/OM and Diagonal and patent LIMA to LAD Optimum Fluoroscopic Angle for Delivery: LAO 16 degrees Cranial 4 degrees IMPRESSION: 1. Tri leaflet severely calcified AV with annular area 482 mm 2 suitable for a 26 mm Sapien 3 valve 2. Severe  annular calcification extending into the inter valvular fibrosa increasing risk of peri valvular regurgitation 3.  Patent SVG;s to RCA/OM/Diagonal and patent LIMA to LAD 4.  No LAA thrombus 5. Optimum angiographic angle for deployment LAO 16 degrees Cranial 4 degrees Jenkins Rouge Electronically Signed: By: Jenkins Rouge M.D. On: 11/20/2017 13:46   Dg Chest Port 1 View  Result Date: 12/03/2017 CLINICAL DATA:  Status post transcatheter aortic valve replacement. EXAM: PORTABLE CHEST 1 VIEW COMPARISON:  Radiographs of November 29, 2017. FINDINGS: Stable cardiomegaly. Status post coronary artery bypass graft. Aortic valve prosthesis is noted. No pneumothorax is noted or significant pleural effusion is noted. Stable bibasilar subsegmental atelectasis or scarring is noted. Right internal jugular catheter is noted with tip in expected position of the SVC. Old right rib fractures are noted. IMPRESSION: Stable bibasilar subsegmental atelectasis or scarring. Electronically Signed   By: Marijo Conception, M.D.   On: 12/03/2017 17:15   Ct Angio Chest Aorta W &/or Wo Contrast  Result Date: 11/20/2017 CLINICAL DATA:  77 year old male with history of severe aortic stenosis. Preprocedural study prior to  potential transcatheter aortic valve replacement (TAVR) procedure. EXAM: CT ANGIOGRAPHY CHEST, ABDOMEN AND PELVIS TECHNIQUE: Multidetector CT imaging through the chest, abdomen and pelvis was performed using the standard protocol during bolus administration of intravenous contrast. Multiplanar reconstructed images and MIPs were obtained and reviewed to evaluate the vascular anatomy. CONTRAST:  60 mL of Isovue 370. COMPARISON:  No priors. FINDINGS: CTA CHEST FINDINGS Cardiovascular: Heart size is mildly enlarged. There is no significant pericardial fluid, thickening or pericardial calcification. There is aortic atherosclerosis, as well as atherosclerosis of the great vessels of the mediastinum and the coronary arteries, including calcified atherosclerotic plaque in the left main, left anterior descending, left circumflex and right coronary arteries. Status post median sternotomy for CABG including LIMA to the LAD. Severe thickening calcification of the aortic valve. Calcifications of the mitral annulus and mitral-aortic intervalvular fibrosa. Mediastinum/Lymph Nodes: No pathologically enlarged mediastinal or hilar lymph nodes. Esophagus is unremarkable in appearance. No axillary lymphadenopathy. Lungs/Pleura: No suspicious appearing pulmonary nodules or masses. No acute consolidative airspace disease. No pleural effusions. Areas of scarring and/or subsegmental atelectasis in the lung bases. Musculoskeletal/Soft Tissues: Median sternotomy wires. There are no aggressive appearing lytic or blastic lesions noted in the visualized portions of the skeleton. CTA ABDOMEN AND PELVIS FINDINGS Hepatobiliary: Subcentimeter low-attenuation lesion in segment 4A of the liver is too small to definitively characterize, but is statistically likely to represent tiny cyst. No other suspicious appearing hepatic lesions. No intra or extrahepatic biliary ductal dilatation. Tiny partially calcified gallstones lie dependently in the  gallbladder. No findings to suggest an acute cholecystitis at this time. Pancreas: No pancreatic mass. No pancreatic ductal dilatation. No pancreatic or peripancreatic fluid or inflammatory changes. Spleen: Unremarkable. Adrenals/Urinary Tract: Several well-defined low-attenuation nonenhancing lesions are noted in the kidneys bilaterally, the largest of which measures 4.3 cm in the upper pole the left kidney. No suspicious renal lesions. Bilateral adrenal glands are normal in appearance. No hydroureteronephrosis. Urinary bladder is normal in appearance. Stomach/Bowel: Normal appearance of the stomach. No pathologic dilatation of small bowel or colon. Normal appendix. Vascular/Lymphatic: Aortic atherosclerosis, without evidence of aneurysm or dissection in the abdominal or pelvic vasculature. Vascular findings and measurements pertinent to potential TAVR procedure, as detailed below. Celiac axis, superior mesenteric artery and inferior mesenteric artery are all widely patent without hemodynamically significant stenosis. Solitary right and 3 left renal arteries all appear patent without definite hemodynamically significant  stenoses. No lymphadenopathy noted in the abdomen or pelvis. Reproductive: Prostate gland and seminal vesicles are unremarkable in appearance. Other: No significant volume of ascites.  No pneumoperitoneum. Musculoskeletal: There are no aggressive appearing lytic or blastic lesions noted in the visualized portions of the skeleton. VASCULAR MEASUREMENTS PERTINENT TO TAVR: AORTA: Minimal Aortic Diameter-14 x 15 mm Severity of Aortic Calcification-moderate RIGHT PELVIS: Right Common Iliac Artery - Minimal Diameter-7.1 x 6.0 mm Tortuosity-mild Calcification-moderate Right External Iliac Artery - Minimal Diameter-7.5 x 6.9 mm Tortuosity-severe Calcification-mild Right Common Femoral Artery - Minimal Diameter-6.9 x 6.3 mm Tortuosity-mild Calcification-moderate LEFT PELVIS: Left Common Iliac Artery -  Minimal Diameter-7.5 x 6.6 mm Tortuosity-mild Calcification-moderate Left External Iliac Artery - Minimal Diameter-8.3 x 6.5 mm Tortuosity-severe Calcification-mild Left Common Femoral Artery - Minimal Diameter-7.9 x 6.0 mm Tortuosity-mild Calcification-moderate Review of the MIP images confirms the above findings. IMPRESSION: 1. Vascular findings and measurements pertinent to potential TAVR procedure, as detailed above. 2. Severe thickening and calcification of the aortic valve, compatible with the reported clinical history of severe aortic stenosis. 3. Aortic atherosclerosis, in addition to left main and 3 vessel coronary artery disease. Status post median sternotomy for CABG including LIMA to the LAD. 4. Cholelithiasis without evidence of acute cholecystitis at this time. 5. Additional incidental findings, as above. Aortic Atherosclerosis (ICD10-I70.0). Electronically Signed   By: Vinnie Langton M.D.   On: 11/20/2017 16:04   Ct Angio Abd/pel W/ And/or W/o  Result Date: 11/20/2017 CLINICAL DATA:  77 year old male with history of severe aortic stenosis. Preprocedural study prior to potential transcatheter aortic valve replacement (TAVR) procedure. EXAM: CT ANGIOGRAPHY CHEST, ABDOMEN AND PELVIS TECHNIQUE: Multidetector CT imaging through the chest, abdomen and pelvis was performed using the standard protocol during bolus administration of intravenous contrast. Multiplanar reconstructed images and MIPs were obtained and reviewed to evaluate the vascular anatomy. CONTRAST:  60 mL of Isovue 370. COMPARISON:  No priors. FINDINGS: CTA CHEST FINDINGS Cardiovascular: Heart size is mildly enlarged. There is no significant pericardial fluid, thickening or pericardial calcification. There is aortic atherosclerosis, as well as atherosclerosis of the great vessels of the mediastinum and the coronary arteries, including calcified atherosclerotic plaque in the left main, left anterior descending, left circumflex and right  coronary arteries. Status post median sternotomy for CABG including LIMA to the LAD. Severe thickening calcification of the aortic valve. Calcifications of the mitral annulus and mitral-aortic intervalvular fibrosa. Mediastinum/Lymph Nodes: No pathologically enlarged mediastinal or hilar lymph nodes. Esophagus is unremarkable in appearance. No axillary lymphadenopathy. Lungs/Pleura: No suspicious appearing pulmonary nodules or masses. No acute consolidative airspace disease. No pleural effusions. Areas of scarring and/or subsegmental atelectasis in the lung bases. Musculoskeletal/Soft Tissues: Median sternotomy wires. There are no aggressive appearing lytic or blastic lesions noted in the visualized portions of the skeleton. CTA ABDOMEN AND PELVIS FINDINGS Hepatobiliary: Subcentimeter low-attenuation lesion in segment 4A of the liver is too small to definitively characterize, but is statistically likely to represent tiny cyst. No other suspicious appearing hepatic lesions. No intra or extrahepatic biliary ductal dilatation. Tiny partially calcified gallstones lie dependently in the gallbladder. No findings to suggest an acute cholecystitis at this time. Pancreas: No pancreatic mass. No pancreatic ductal dilatation. No pancreatic or peripancreatic fluid or inflammatory changes. Spleen: Unremarkable. Adrenals/Urinary Tract: Several well-defined low-attenuation nonenhancing lesions are noted in the kidneys bilaterally, the largest of which measures 4.3 cm in the upper pole the left kidney. No suspicious renal lesions. Bilateral adrenal glands are normal in appearance. No hydroureteronephrosis. Urinary bladder is normal  in appearance. Stomach/Bowel: Normal appearance of the stomach. No pathologic dilatation of small bowel or colon. Normal appendix. Vascular/Lymphatic: Aortic atherosclerosis, without evidence of aneurysm or dissection in the abdominal or pelvic vasculature. Vascular findings and measurements pertinent to  potential TAVR procedure, as detailed below. Celiac axis, superior mesenteric artery and inferior mesenteric artery are all widely patent without hemodynamically significant stenosis. Solitary right and 3 left renal arteries all appear patent without definite hemodynamically significant stenoses. No lymphadenopathy noted in the abdomen or pelvis. Reproductive: Prostate gland and seminal vesicles are unremarkable in appearance. Other: No significant volume of ascites.  No pneumoperitoneum. Musculoskeletal: There are no aggressive appearing lytic or blastic lesions noted in the visualized portions of the skeleton. VASCULAR MEASUREMENTS PERTINENT TO TAVR: AORTA: Minimal Aortic Diameter-14 x 15 mm Severity of Aortic Calcification-moderate RIGHT PELVIS: Right Common Iliac Artery - Minimal Diameter-7.1 x 6.0 mm Tortuosity-mild Calcification-moderate Right External Iliac Artery - Minimal Diameter-7.5 x 6.9 mm Tortuosity-severe Calcification-mild Right Common Femoral Artery - Minimal Diameter-6.9 x 6.3 mm Tortuosity-mild Calcification-moderate LEFT PELVIS: Left Common Iliac Artery - Minimal Diameter-7.5 x 6.6 mm Tortuosity-mild Calcification-moderate Left External Iliac Artery - Minimal Diameter-8.3 x 6.5 mm Tortuosity-severe Calcification-mild Left Common Femoral Artery - Minimal Diameter-7.9 x 6.0 mm Tortuosity-mild Calcification-moderate Review of the MIP images confirms the above findings. IMPRESSION: 1. Vascular findings and measurements pertinent to potential TAVR procedure, as detailed above. 2. Severe thickening and calcification of the aortic valve, compatible with the reported clinical history of severe aortic stenosis. 3. Aortic atherosclerosis, in addition to left main and 3 vessel coronary artery disease. Status post median sternotomy for CABG including LIMA to the LAD. 4. Cholelithiasis without evidence of acute cholecystitis at this time. 5. Additional incidental findings, as above. Aortic Atherosclerosis  (ICD10-I70.0). Electronically Signed   By: Vinnie Langton M.D.   On: 11/20/2017 16:04     Diagnostic Studies/Procedures    TAVR OPERATIVE NOTE   Date of Procedure:12/03/2017  Preoperative Diagnosis:Severe Aortic Stenosis   Postoperative Diagnosis:Same   Procedure:   Transcatheter Aortic Valve Replacement - PercutaneousRightTransfemoral Approach Edwards Sapien 3 THV (size 17mm, model # 9600TFX, serial # [id number])  Co-Surgeons:Jakeira Seeman Burt Knack, MD andClarence H. Roxy Manns, MD   Anesthesiologist:Adam Marcie Bal, MD  Dala Dock, MD  Pre-operative Echo Findings: ? Severe aortic stenosis ? Normalleft ventricular systolic function  Post-operative Echo Findings: ? Mildparavalvular leak ? Normalleft ventricular systolic function  ______________   Post operative echo 12/04/17 - Left ventricle: The cavity size was normal. Wall thickness was   normal. Systolic function was normal. The estimated ejection   fraction was in the range of 60% to 65%. Wall motion was normal;   there were no regional wall motion abnormalities. Features are   consistent with a pseudonormal left ventricular filling pattern,   with concomitant abnormal relaxation and increased filling   pressure (grade 2 diastolic dysfunction). - Aortic valve: A bioprosthesis was present. Valve area (VTI): 1.57   cm^2. Valve area (Vmax): 1.41 cm^2. Valve area (Vmean): 1.31   cm^2. - Mitral valve: There was mild regurgitation. Valve area by   continuity equation (using LVOT flow): 1.29 cm^2.   Disposition   Pt is being discharged home today in good condition.  Follow-up Plans & Appointments    Follow-up Information    Eileen Stanford, PA-C. Go on 12/12/2017.   Specialties:  Cardiology, Radiology Why:  @ 3pm  Contact information: [address] [place]  [id number] [phone]  Discharge Instructions    Diet - low sodium heart healthy   Complete by:  As directed    Increase activity slowly   Complete by:  As directed       Discharge Medications     Medication List    STOP taking these medications   clopidogrel 75 MG tablet Commonly known as:  PLAVIX     TAKE these medications   amLODipine 5 MG tablet Commonly known as:  NORVASC Take 1 tablet (5 mg total) by mouth daily.   aspirin 81 MG EC tablet Take 1 tablet (81 mg total) by mouth daily. Start taking on:  12/06/2017   citalopram 10 MG tablet Commonly known as:  CELEXA Take 1 tablet (10 mg total) by mouth daily.   furosemide 40 MG tablet Commonly known as:  LASIX Take 40 mg by mouth 2 (two) times daily.   insulin NPH Human 100 UNIT/ML injection Commonly known as:  HUMULIN N,NOVOLIN N Inject 60 Units into the skin See admin instructions. twice daily - after lunch and supper   insulin regular 100 units/mL injection Commonly known as:  NOVOLIN R,HUMULIN R Inject 40 Units into the skin 2 (two) times daily. after lunch and supper   isosorbide mononitrate 60 MG 24 hr tablet Commonly known as:  IMDUR Take 60-120 mg by mouth 2 (two) times daily. Take 2 tablets (120 mg) by mouth every morning & take 1 tablet (60 mg) by mouth in the evening.   metoprolol succinate 100 MG 24 hr tablet Commonly known as:  TOPROL-XL Take 1 tablet (100 mg total) by mouth daily after supper. Take with or immediately following a meal.   nitroGLYCERIN 0.4 MG SL tablet Commonly known as:  NITROSTAT Place 0.4 mg every 5 (five) minutes as needed under the tongue for chest pain.   Rivaroxaban 15 MG Tabs tablet Commonly known as:  XARELTO Take 1 tablet (15 mg total) by mouth daily with supper.   rosuvastatin 40 MG tablet Commonly known as:  CRESTOR Take 40 mg daily after supper by mouth.       Outstanding Labs/Studies   BMET  Duration of Discharge Encounter   Greater  than 30 minutes including physician time.  Signed, Angelena Form PA-C 12/05/2017, 9:58 AM   Patient seen, examined. Available data reviewed. Agree with findings, assessment, and plan as outlined by Nell Range, PA-C. On my exam today: Vitals:   12/04/17 1929 12/05/17 0300  BP: (!) 134/46 (!) 144/65  Pulse: 76 71  Resp: 19 (!) 21  Temp: 98.2 F (36.8 C) 98 F (36.7 C)  SpO2: 94% 93%   Pt is alert and oriented, NAD HEENT: normal Neck: JVP - normal Lungs: CTA bilaterally CV: RRR with soft systolic ejection murmur at the RUSB, no diastolic murmur Abd: soft, NT, Positive BS, no hepatomegaly Ext: trace bilateral pretibial edema, left groin site with mild ecchymoses no firm hematoma or tenderness Skin: warm/dry no rash  Echo reviewed and shows normal TAVR valve function and normal LVEF. Pt clinically stable on current Rx. Creatinine in stable range after IV lasix yesterday. Agree with resuming at home lasix dose. Pt will be discharged on Xarelto 15 mg daily (paroxysmal atrial fibrillation) and ASA 81 mg daily. All questions answered. FU arranged.   Sherren Mocha, M.D. 12/05/2017 11:54 AM

## 2017-12-06 ENCOUNTER — Telehealth: Payer: Self-pay | Admitting: Physician Assistant

## 2017-12-06 MED FILL — Magnesium Sulfate Inj 50%: INTRAMUSCULAR | Qty: 10 | Status: AC

## 2017-12-06 MED FILL — Heparin Sodium (Porcine) Inj 1000 Unit/ML: INTRAMUSCULAR | Qty: 30 | Status: AC

## 2017-12-06 MED FILL — Potassium Chloride Inj 2 mEq/ML: INTRAVENOUS | Qty: 40 | Status: AC

## 2017-12-06 NOTE — Telephone Encounter (Signed)
  [facility] VALVE TEAM   Patient contacted regarding discharge from [facility] on 12/05/17  Patient understands to follow up with provider Nell Range on 2/14 @  3pm at [facility].  Patient understands discharge instructions? yes Patient understands medications and regiment? yes Patient understands to bring all medications to this visit? yes  Angelena Form PA-C  [facility]

## 2017-12-10 ENCOUNTER — Other Ambulatory Visit: Payer: Self-pay

## 2017-12-10 DIAGNOSIS — Z952 Presence of prosthetic heart valve: Secondary | ICD-10-CM

## 2017-12-10 DIAGNOSIS — I350 Nonrheumatic aortic (valve) stenosis: Secondary | ICD-10-CM

## 2017-12-12 ENCOUNTER — Encounter: Payer: Self-pay | Admitting: Physician Assistant

## 2017-12-12 ENCOUNTER — Ambulatory Visit: Payer: Medicare Other | Admitting: Physician Assistant

## 2017-12-12 VITALS — BP 128/54 | HR 83

## 2017-12-12 DIAGNOSIS — I10 Essential (primary) hypertension: Secondary | ICD-10-CM

## 2017-12-12 DIAGNOSIS — I5032 Chronic diastolic (congestive) heart failure: Secondary | ICD-10-CM | POA: Diagnosis not present

## 2017-12-12 DIAGNOSIS — I2510 Atherosclerotic heart disease of native coronary artery without angina pectoris: Secondary | ICD-10-CM

## 2017-12-12 DIAGNOSIS — Z952 Presence of prosthetic heart valve: Secondary | ICD-10-CM

## 2017-12-12 DIAGNOSIS — E118 Type 2 diabetes mellitus with unspecified complications: Secondary | ICD-10-CM

## 2017-12-12 DIAGNOSIS — N189 Chronic kidney disease, unspecified: Secondary | ICD-10-CM

## 2017-12-12 MED ORDER — AMOXICILLIN 500 MG PO CAPS: ORAL_CAPSULE | ORAL | 12 refills | Status: DC

## 2017-12-12 NOTE — Patient Instructions (Signed)
Medication Instructions:  Your physician recommends that you continue on your current medications as directed. Please refer to the Current Medication list given to you today.  Labwork: BMET today  Testing/Procedures: None  Follow-Up: Keep current follow up appointment with Dr. Burt Knack and echo on 3/1.  Any Other Special Instructions Will Be Listed Below (If Applicable).     If you need a refill on your cardiac medications before your next appointment, please call your pharmacy.

## 2017-12-12 NOTE — Progress Notes (Signed)
[facility]                                       Cardiology Office Note    Date:  12/12/2017   ID:  Cameron [name], DOB 11-28-1940, MRN [id number]  PCP:  Orpah Melter, MD  Cardiologist: Dr. Virgina Jock / Dr. Burt Knack & Dr. Roxy Manns (TAVR)  CC: [name] s/p TAVR   History of Present Illness:  Cameron [name] is a 77 y.o. 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   The patient's cardiac history dates back to 2002 when he presented with exertional angina. He was found to have three-vessel coronary artery disease and underwent coronary artery bypass grafting x4. His procedure was uncomplicated and he has done well from a cardiac standpoint until fairly recently. For many years he was followed by Dr. Rex Kras, and more recently he has been followed by Dr. Einar Gip and Dr. Earnie Larsson.Patient states that over the past year or 2 he has developed progressive symptoms of exertional chest discomfort, shortness of breath, and fatigue. This fall it got particularly severe with 3 somewhat prolonged episodes of chest discomfort and shortness of breath following exertion.  In 08/2017, he developed severe chest pain at rest associated with weakness and fatigue. He was hospitalized and ruled in for non-ST segment elevation myocardial infarction with serial cardiac enzymes increasing from 0.05-0.18. He was noted to have mild pulmonary edema and mildly elevated BNP consistent with acute exacerbation of chronic diastolic congestive heart failure. Follow-up transthoracic echocardiogram performed 09/15/17, revealed normal left ventricular systolic function with ejection fraction estimated 55-60%. There was severe aortic stenosis with peak velocity across the aortic valve  measured 4.0 m/s corresponding to mean transvalvular gradient estimated 36 mmHg and DVI measured 0.26. Initial plans for diagnostic catheterization were postponed due to acute exacerbation of chronic kidney disease, but once the patient's renal function stabilized the patient underwent catheterization on September 17, 2017. Catheterization revealed severe native coronary artery disease with continued patency of all 4 previously placed bypass grafts but severe 90%focal stenosis of the saphenous vein graft to the diagonal branch. There was mild pulmonary hypertension. The patient was seen in consultation by Dr. Burt Knack who felt that it was reasonable to proceed with plans for PCI and stenting of the diseased vein graft to the diagonal branch followed by delayed evaluation for possible transcatheter aortic valve replacement.On September 23, 2017 the patient underwent balloon angioplasty of the diseased vein graft to diagonal branch. There was distal embolization in the native vessel with no reflow. A stent was not placed.During the patient's hospitalization he was also noted to be in atrial fibrillation with underlying right bundle branch block. He was ultimately discharged home on Plavix and Xarelto.  He was eventfully evaluated by the multidisciplinary valve team and underwent successful TAVR with a 26 mm Edwards Sapien THV via the R TF approach on 12/03/17. Post operative echo showed normal THV function with a mean/peak gradient of 11/24 mm Hg. He was discharged on ASA 81mg  daily and Xarelto 15 mg daily.   Today he presents to clinic for follow up. No CP or SOB. No LE edema, orthopnea  or PND. He sleeps in a recliner due to comfort issues. No dizziness or syncope. No blood in stool or urine. No palpitations. He is not very active and I have encouraged him to start walking more. He can do all his normal activities without dyspnea. He wishes he had had this valve procedure done sooner.    Past Medical  History:  Diagnosis Date  . CAD (coronary artery disease)    a. 2002: s/p CABG x 4V (LIMA-LAD, seq SVG-PDA, PLA, seq SVG-OM1, OM2, SVG-Diag1)  . CKD (chronic kidney disease)   . COPD (chronic obstructive pulmonary disease) ([place])   . Diabetes mellitus without complication ([place])   . HLD (hyperlipidemia)   . Hypertension   . Obesity   . PAF (paroxysmal atrial fibrillation) ([place])   . RBBB   . 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 Severe aortic stenosis     Past Surgical History:  Procedure Laterality Date  . CARDIAC SURGERY    . CORONARY BALLOON ANGIOPLASTY N/A 09/23/2017   Procedure: CORONARY BALLOON ANGIOPLASTY;  Surgeon: Nigel Mormon, MD;  Location: [facility];  Service: Cardiovascular;  Laterality: N/A;  . FINGER AMPUTATION    . HERNIA REPAIR    . open heart surgery    . RIGHT HEART CATH AND CORONARY/GRAFT ANGIOGRAPHY N/A 09/17/2017   Procedure: RIGHT HEART CATH AND CORONARY/GRAFT ANGIOGRAPHY;  Surgeon: Nigel Mormon, MD;  Location: [facility];  Service: Cardiovascular;  Laterality: N/A;  . TEE WITHOUT CARDIOVERSION N/A 12/03/2017   Procedure: TRANSESOPHAGEAL ECHOCARDIOGRAM (TEE);  Surgeon: Sherren Mocha, MD;  Location: [facility];  Service: Open Heart Surgery;  Laterality: N/A;  . TOE AMPUTATION    . TRANSCATHETER AORTIC VALVE REPLACEMENT, TRANSFEMORAL N/A 12/03/2017   Procedure: TRANSCATHETER AORTIC VALVE REPLACEMENT, TRANSFEMORAL;  Surgeon: Sherren Mocha, MD;  Location: [facility];  Service: Open Heart Surgery;  Laterality: N/A;  . ULTRASOUND GUIDANCE FOR VASCULAR ACCESS  09/17/2017   Procedure: Ultrasound Guidance For Vascular Access;  Surgeon: Nigel Mormon, MD;  Location: [facility];  Service: Cardiovascular;;    Current Medications: Outpatient Medications Prior to Visit  Medication Sig Dispense Refill  . amLODipine (NORVASC) 5 MG  tablet Take 1 tablet (5 mg total) by mouth daily. 30 tablet 0  . aspirin EC 81 MG EC tablet Take 1 tablet (81 mg total) by mouth daily.    . citalopram (CELEXA) 10 MG tablet Take 1 tablet (10 mg total) by mouth daily. 60 tablet 3  . furosemide (LASIX) 40 MG tablet Take 40 mg by mouth 2 (two) times daily.     . insulin NPH (HUMULIN N,NOVOLIN N) 100 UNIT/ML injection Inject 60 Units into the skin See admin instructions. twice daily - after lunch and supper    . insulin regular (NOVOLIN R,HUMULIN R) 100 units/mL injection Inject 40 Units into the skin 2 (two) times daily. after lunch and supper    . isosorbide mononitrate (IMDUR) 60 MG 24 hr tablet Take 60-120 mg by mouth 2 (two) times daily. Take 2 tablets (120 mg) by mouth every morning & take 1 tablet (60 mg) by mouth in the evening.    . metoprolol succinate (TOPROL-XL) 100 MG 24 hr tablet Take 1 tablet (100 mg total) by mouth daily after supper. Take with or immediately following a meal. 60 tablet 3  . nitroGLYCERIN (NITROSTAT) 0.4 MG SL tablet Place 0.4  mg every 5 (five) minutes as needed under the tongue for chest pain.    . Rivaroxaban (XARELTO) 15 MG TABS tablet Take 1 tablet (15 mg total) by mouth daily with supper. 60 tablet 3  . rosuvastatin (CRESTOR) 40 MG tablet Take 40 mg daily after supper by mouth.      No facility-administered medications prior to visit.      Allergies:   Pravastatin; Cefuroxime; and Ezetimibe   Social History   Socioeconomic History  . Marital status: Married    Spouse name: None  . Number of children: 2  . Years of education: None  . Highest education level: None  Social Needs  . Financial resource strain: None  . Food insecurity - worry: None  . Food insecurity - inability: None  . Transportation needs - medical: None  . Transportation needs - non-medical: None  Occupational History  . Occupation: Doctor, general practice  Tobacco Use  . Smoking status: Former Smoker    Packs/day: 2.00     Years: 20.00    Pack years: 40.00    Types: Cigarettes    Last attempt to quit: 09/28/1978    Years since quitting: 39.2  . Smokeless tobacco: Never Used  Substance and Sexual Activity  . Alcohol use: No  . Drug use: No  . Sexual activity: None  Other Topics Concern  . None  Social History Narrative  . None     Family History:  The patient's family history includes Heart disease in his father; Lung cancer in his maternal grandmother; Stomach cancer in his paternal grandfather.      ROS:   Please see the history of present illness.    ROS All other systems reviewed and are negative.   PHYSICAL EXAM:   VS:  BP (!) 128/54   Pulse 83    GEN: Well nourished, well developed, in no acute distress, overweight HEENT: normal  Neck: no JVD, carotid bruits, or masses Cardiac: RRR; no murmurs, rubs, or gallops, trace bilateral LE edema  Respiratory:  clear to auscultation bilaterally, normal work of breathing GI: soft, nontender, nondistended, + BS MS: no deformity or atrophy  Skin: warm and dry, no rash. Groin sites stable.  Neuro:  Alert and Oriented x 3, Strength and sensation are intact Psych: euthymic mood, full affect   Wt Readings from Last 3 Encounters:  12/05/17 209 lb 14.4 oz (95.2 kg)  11/29/17 213 lb (96.6 kg)  11/29/17 213 lb 3.2 oz (96.7 kg)      Studies/Labs Reviewed:   EKG:  EKG is ordered today.  The ekg ordered today demonstrates NSR, RBBB, HR 80  Recent Labs: 09/14/2017: TSH 3.159 11/29/2017: ALT 18; B Natriuretic Peptide 175.8 12/04/2017: Hemoglobin 13.7; Magnesium 1.8; Platelets 122 12/05/2017: BUN 51; Creatinine, Ser 2.92; Potassium 3.8; Sodium 137   Lipid Panel    Component Value Date/Time   CHOL 135 09/15/2017 0255   TRIG 126 09/20/2017 2143   HDL 28 (L) 09/15/2017 0255   CHOLHDL 4.8 09/15/2017 0255   VLDL 34 09/15/2017 0255   LDLCALC 73 09/15/2017 0255    Additional studies/ records that were reviewed today include:  TAVR OPERATIVE  NOTE   Date of Procedure:12/03/2017  Preoperative Diagnosis:Severe Aortic Stenosis   Postoperative Diagnosis:Same   Procedure:   Transcatheter Aortic Valve Replacement - PercutaneousRightTransfemoral Approach Edwards Sapien 3 THV (size 55mm, model # 9600TFX, serial # [id number])  Co-Surgeons:Michael Burt Knack, MD andClarence H. Roxy Manns, MD   Anesthesiologist:Adam Marcie Bal, MD  Dala Dock, MD  Pre-operative Echo Findings: ? Severe aortic stenosis ? Normalleft ventricular systolic function  Post-operative Echo Findings: ? Mildparavalvular leak ? Normalleft ventricular systolic function  ______________   Post operative echo 12/04/17 - Left ventricle: The cavity size was normal. Wall thickness was normal. Systolic function was normal. The estimated ejection fraction was in the range of 60% to 65%. Wall motion was normal; there were no regional wall motion abnormalities. Features are consistent with a pseudonormal left ventricular filling pattern, with concomitant abnormal relaxation and increased filling pressure (grade 2 diastolic dysfunction). - Aortic valve: A bioprosthesis was present. Valve area (VTI): 1.57 cm^2. Valve area (Vmax): 1.41 cm^2. Valve area (Vmean): 1.31 cm^2. - Mitral valve: There was mild regurgitation. Valve area by continuity equation (using LVOT flow): 1.29 cm^2.    ASSESSMENT & PLAN:   Severe AS s/p TAVR: doing great. Groin sites are stable. He is cleared to start driving and resume normal activities. SBE prophylaxis discussed; Amoxil sent into pharmacy. Continue ASA 81mg  daily and Xarelto 15mg  daily. He will be seen back by Dr. Burt Knack with an echo on 12/27/17.   HTN: BP well controlled today  CAD: s/p CABG x4V (2002) and recent failed [place] to SVG-->diag. This seems to be stable; he has not had  recent angina. Continue medical therapy. Plavix switched on ASA given concomitant Xarelto use and no stent placement in 08/2017.  CKD: creat 2.92 at discharge. I will check a BMET today  Chronic diastolic CHF: he appears euvolemic today. Continue lasix 40mg  BID. Will check a BMET   DMT2: uncontrolled. Continue home regimen and follow up with PCP.    Medication Adjustments/Labs and Tests Ordered: Current medicines are reviewed at length with the patient today.  Concerns regarding medicines are outlined above.  Medication changes, Labs and Tests ordered today are listed in the Patient Instructions below. Patient Instructions  Medication Instructions:  Your physician recommends that you continue on your current medications as directed. Please refer to the Current Medication list given to you today.  Labwork: BMET today  Testing/Procedures: None  Follow-Up: Keep current follow up appointment with Dr. Burt Knack and echo on 3/1.  Any Other Special Instructions Will Be Listed Below (If Applicable).     If you need a refill on your cardiac medications before your next appointment, please call your pharmacy.      Signed, Angelena Form, PA-C  12/12/2017 3:40 PM    [facility] [address], [place], [place]  [address] Phone: [phone]; Fax: [phone]

## 2017-12-13 LAB — BASIC METABOLIC PANEL
BUN/Creatinine Ratio: 14 (ref 10–24)
BUN: 44 mg/dL — AB (ref 8–27)
CALCIUM: 8.6 mg/dL (ref 8.6–10.2)
CHLORIDE: 97 mmol/L (ref 96–106)
CO2: 25 mmol/L (ref 20–29)
Creatinine, Ser: 3.06 mg/dL — ABNORMAL HIGH (ref 0.76–1.27)
GFR calc Af Amer: 22 mL/min/{1.73_m2} — ABNORMAL LOW (ref >59)
GFR calc non Af Amer: 19 mL/min/{1.73_m2} — ABNORMAL LOW (ref >59)
GLUCOSE: 289 mg/dL — AB (ref 65–99)
Potassium: 4.2 mmol/L (ref 3.5–5.2)
Sodium: 140 mmol/L (ref 134–144)

## 2017-12-18 ENCOUNTER — Telehealth: Payer: Self-pay

## 2017-12-18 MED ORDER — FUROSEMIDE 40 MG PO TABS: 40.0000 mg | ORAL_TABLET | Freq: Every day | ORAL | 11 refills | Status: DC

## 2017-12-18 NOTE — Telephone Encounter (Signed)
Per 2/14 BMET result notes, "Notes recorded by Eileen Stanford, PA-C on 12/13/2017 at 8:33 AM EST Creat elevated a little above baseline. Lets decrease lasix from 40mg  BID to 40mg  daily and see how he does. He see's Dr. Virgina Jock in the next couple weeks."   Informed patient of results and verbal understanding expressed.  Instructed patient to DECREASE LASIX to 40 mg daily. He understands to call if he experiencing swelling, weight gain, or SOB. He was grateful for call and agrees with treatment plan.

## 2017-12-27 ENCOUNTER — Ambulatory Visit (HOSPITAL_COMMUNITY): Payer: Medicare Other | Attending: Cardiology

## 2017-12-27 ENCOUNTER — Other Ambulatory Visit: Payer: Self-pay

## 2017-12-27 ENCOUNTER — Encounter: Payer: Self-pay | Admitting: Cardiovascular Disease

## 2017-12-27 ENCOUNTER — Ambulatory Visit: Payer: Medicare Other | Admitting: Cardiovascular Disease

## 2017-12-27 VITALS — BP 138/58 | HR 82 | Ht 69.0 in | Wt 217.8 lb

## 2017-12-27 DIAGNOSIS — I509 Heart failure, unspecified: Secondary | ICD-10-CM | POA: Diagnosis not present

## 2017-12-27 DIAGNOSIS — Z951 Presence of aortocoronary bypass graft: Secondary | ICD-10-CM | POA: Insufficient documentation

## 2017-12-27 DIAGNOSIS — E785 Hyperlipidemia, unspecified: Secondary | ICD-10-CM | POA: Diagnosis not present

## 2017-12-27 DIAGNOSIS — Z952 Presence of prosthetic heart valve: Secondary | ICD-10-CM | POA: Diagnosis not present

## 2017-12-27 DIAGNOSIS — J449 Chronic obstructive pulmonary disease, unspecified: Secondary | ICD-10-CM | POA: Diagnosis not present

## 2017-12-27 DIAGNOSIS — I350 Nonrheumatic aortic (valve) stenosis: Secondary | ICD-10-CM | POA: Diagnosis not present

## 2017-12-27 DIAGNOSIS — I059 Rheumatic mitral valve disease, unspecified: Secondary | ICD-10-CM | POA: Diagnosis not present

## 2017-12-27 DIAGNOSIS — Z953 Presence of xenogenic heart valve: Secondary | ICD-10-CM | POA: Diagnosis not present

## 2017-12-27 DIAGNOSIS — I2510 Atherosclerotic heart disease of native coronary artery without angina pectoris: Secondary | ICD-10-CM | POA: Diagnosis not present

## 2017-12-27 DIAGNOSIS — I110 Hypertensive heart disease with heart failure: Secondary | ICD-10-CM | POA: Insufficient documentation

## 2017-12-27 MED ORDER — FUROSEMIDE 40 MG PO TABS: 40.0000 mg | ORAL_TABLET | Freq: Two times a day (BID) | ORAL | 11 refills | Status: DC

## 2017-12-27 NOTE — Progress Notes (Signed)
Cardiology Office Note Date:  12/27/2017   ID:  REGINALDO HAZARD, DOB 1941-05-13, MRN [id number]  PCP:  Orpah Melter, MD  Cardiologist:  Sherren Mocha, MD    Chief Complaint  Patient presents with  . Fatigue     History of Present Illness: RENAUD CELLI is a 77 y.o. male who presents for 30-day TAVR visit after undergoing percutaneous transfemoral TAVR 12/03/2017.  The patient has a complex cardiac and medical history with history of remote CABG, PCI in 2018, stage III chronic kidney disease, paroxysmal atrial fibrillation anticoagulated with rivaroxaban, and type 2 diabetes (insulin requiring).  The patient developed progressive symptoms of heart failure and angina late last year and was found to have severe aortic stenosis.  After multidisciplinary evaluation we elected to proceed with TAVR and the patient was treated with a 26 mm Edwards sapien 3 transcatheter heart valve with an uncomplicated postoperative course.  He presents today for follow-up evaluation as well as 30-day echo assessment.  The patient is here with his wife.  He continues to have generalized fatigue and leg swelling.  His furosemide was reduced to 40 mg once daily because of a slight increase in his creatinine.  He has a pulling sensation in his chest but no exertional angina.  He has not taken any nitroglycerin.  His breathing is improved.  He continues to suffer from lack of energy.  He denies orthopnea or PND.   Past Medical History:  Diagnosis Date  . CAD (coronary artery disease)    a. 2002: s/p CABG x 4V (LIMA-LAD, seq SVG-PDA, PLA, seq SVG-OM1, OM2, SVG-Diag1)  . CKD (chronic kidney disease)   . COPD (chronic obstructive pulmonary disease) ([place])   . Diabetes mellitus without complication ([place])   . HLD (hyperlipidemia)   . Hypertension   . Obesity   . PAF (paroxysmal atrial fibrillation) ([place])   . RBBB   . 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 Severe aortic stenosis     Past Surgical History:  Procedure Laterality Date  . CARDIAC SURGERY    . CORONARY BALLOON ANGIOPLASTY N/A 09/23/2017   Procedure: CORONARY BALLOON ANGIOPLASTY;  Surgeon: Nigel Mormon, MD;  Location: [facility];  Service: Cardiovascular;  Laterality: N/A;  . FINGER AMPUTATION    . HERNIA REPAIR    . open heart surgery    . RIGHT HEART CATH AND CORONARY/GRAFT ANGIOGRAPHY N/A 09/17/2017   Procedure: RIGHT HEART CATH AND CORONARY/GRAFT ANGIOGRAPHY;  Surgeon: Nigel Mormon, MD;  Location: [facility];  Service: Cardiovascular;  Laterality: N/A;  . TEE WITHOUT CARDIOVERSION N/A 12/03/2017   Procedure: TRANSESOPHAGEAL ECHOCARDIOGRAM (TEE);  Surgeon: Sherren Mocha, MD;  Location: [facility];  Service: Open Heart Surgery;  Laterality: N/A;  . TOE AMPUTATION    . TRANSCATHETER AORTIC VALVE REPLACEMENT, TRANSFEMORAL N/A 12/03/2017   Procedure: TRANSCATHETER AORTIC VALVE REPLACEMENT, TRANSFEMORAL;  Surgeon: Sherren Mocha, MD;  Location: [facility];  Service: Open Heart Surgery;  Laterality: N/A;  . ULTRASOUND GUIDANCE FOR VASCULAR ACCESS  09/17/2017   Procedure: Ultrasound Guidance For Vascular Access;  Surgeon: Nigel Mormon, MD;  Location: [facility];  Service: Cardiovascular;;    Current Outpatient Medications  Medication Sig Dispense Refill  . amLODipine (NORVASC) 5 MG tablet Take 1 tablet (5 mg total) by mouth daily. 30 tablet 0  . amoxicillin (AMOXIL) 500 MG capsule Take 4  tablets (2g total) one hour prior to dental appointment 4 capsule 12  . aspirin EC 81 MG EC tablet Take 1 tablet (81 mg total) by mouth daily.    . citalopram (CELEXA) 10 MG tablet Take 1 tablet (10 mg total) by mouth daily. 60 tablet 3  . furosemide (LASIX) 40 MG tablet Take 1 tablet (40 mg total) by mouth 2 (two) times daily. 60 tablet 11  . insulin NPH (HUMULIN N,NOVOLIN N) 100 UNIT/ML injection Inject 60 Units into  the skin See admin instructions. twice daily - after lunch and supper    . insulin regular (NOVOLIN R,HUMULIN R) 100 units/mL injection Inject 40 Units into the skin 2 (two) times daily. after lunch and supper    . isosorbide mononitrate (IMDUR) 60 MG 24 hr tablet Take 60-120 mg by mouth 2 (two) times daily. Take 2 tablets (120 mg) by mouth every morning & take 1 tablet (60 mg) by mouth in the evening.    . metoprolol succinate (TOPROL-XL) 100 MG 24 hr tablet Take 1 tablet (100 mg total) by mouth daily after supper. Take with or immediately following a meal. 60 tablet 3  . nitroGLYCERIN (NITROSTAT) 0.4 MG SL tablet Place 0.4 mg every 5 (five) minutes as needed under the tongue for chest pain.    . Rivaroxaban (XARELTO) 15 MG TABS tablet Take 1 tablet (15 mg total) by mouth daily with supper. 60 tablet 3  . rosuvastatin (CRESTOR) 40 MG tablet Take 40 mg daily after supper by mouth.      No current facility-administered medications for this visit.     Allergies:   Pravastatin; Cefuroxime; and Ezetimibe   Social History:  The patient  reports that he quit smoking about 39 years ago. His smoking use included cigarettes. He has a 40.00 pack-year smoking history. he has never used smokeless tobacco. He reports that he does not drink alcohol or use drugs.   Family History:  The patient's  family history includes Heart disease in his father; Lung cancer in his maternal grandmother; Stomach cancer in his paternal grandfather.    ROS:  Please see the history of present illness.  All other systems are reviewed and negative.    PHYSICAL EXAM: VS:  BP (!) 138/58   Pulse 82   Ht 5\' 9"  (1.753 m)   Wt 217 lb 12.8 oz (98.8 kg)   BMI 32.16 kg/m  , BMI Body mass index is 32.16 kg/m. GEN: Pleasant elderly male, in no acute distress  HEENT: normal  Neck: no JVD, no masses.  Cardiac: RRR without murmur or gallop                Respiratory:  clear to auscultation bilaterally, normal work of breathing GI:  soft, nontender, nondistended, + BS MS: no deformity or atrophy  Ext: 2+ pretibial edema on the right, 1+ on the left Skin: warm and dry, no rash Neuro:  Strength and sensation are intact Psych: euthymic mood, full affect  EKG:  EKG is not ordered today.  Recent Labs: 09/14/2017: TSH 3.159 11/29/2017: ALT 18; B Natriuretic Peptide 175.8 12/04/2017: Hemoglobin 13.7; Magnesium 1.8; Platelets 122 12/12/2017: BUN 44; Creatinine, Ser 3.06; Potassium 4.2; Sodium 140   Lipid Panel     Component Value Date/Time   CHOL 135 09/15/2017 0255   TRIG 126 09/20/2017 2143   HDL 28 (L) 09/15/2017 0255   CHOLHDL 4.8 09/15/2017 0255   VLDL 34 09/15/2017 0255   LDLCALC 73 09/15/2017 0255  Wt Readings from Last 3 Encounters:  12/27/17 217 lb 12.8 oz (98.8 kg)  12/05/17 209 lb 14.4 oz (95.2 kg)  11/29/17 213 lb (96.6 kg)     Cardiac Studies Reviewed: Echocardiogram from today is currently pending.  I did review the images and the patient has normal LV systolic function.  His transvalvular gradients are normal.  There is no paravalvular regurgitation.  ASSESSMENT AND PLAN: 1.  Aortic valve disease status post TAVR: The patient appears stable.  He is managed with aspirin alone because of chronic anticoagulation with rivaroxaban.  I personally reviewed his echo images which demonstrated normal function of his transcatheter heart valve.  2.  Acute on chronic diastolic heart failure: This is likely multifactorial with ischemic heart disease, chronic aortic valve disease, and chronic kidney disease.  Advised that he increase his furosemide back to 40 mg twice daily and otherwise continue on his current medical therapy.  Advised him to set up a follow-up visit with Dr. Joelyn Oms at [facility].  He will continue to follow regularly with Dr Virgina Jock.   3. CKD 3: creatinine up to 3.06 mg/dL on most recent lab.   4. CAD, native vessel, with angina: Symptoms appear well controlled at present.  Current  medicines are reviewed with the patient today.  The patient does not have concerns regarding medicines.  Labs/ tests ordered today include:  No orders of the defined types were placed in this encounter.   Disposition:   FU Dr Virgina Jock as scheduled. Return for 1 year Valve Clinic visit with an echo (will be arranged)  Signed, Sherren Mocha, MD  12/27/2017 5:30 PM    [address], [place], [place]  [address] Phone: [phone]; Fax: [phone]

## 2017-12-27 NOTE — Patient Instructions (Signed)
Medication Instructions:  1) INCREASE LASIX to 40 mg twice dialy  Labwork: None  Testing/Procedures: Your provider has requested that you have an echocardiogram in 1 year. Echocardiography is a painless test that uses sound waves to create images of your heart. It provides your doctor with information about the size and shape of your heart and how well your heart's chambers and valves are working. This procedure takes approximately one hour. There are no restrictions for this procedure.    Follow-Up: Your provider wants you to follow-up in: 1 year with Nell Range, PA. You will receive a reminder letter in the mail two months in advance. If you don't receive a letter, please call our office to schedule the follow-up appointment.    Any Other Special Instructions Will Be Listed Below (If Applicable).     If you need a refill on your cardiac medications before your next appointment, please call your pharmacy.

## 2018-01-01 ENCOUNTER — Telehealth (HOSPITAL_COMMUNITY): Payer: Self-pay

## 2018-01-01 NOTE — Telephone Encounter (Signed)
Called to speak with patient in regards to Cardiac Rehab - Patient is not interested in the program as he cannot afford the program. Closed referral.

## 2018-01-10 ENCOUNTER — Other Ambulatory Visit: Payer: Self-pay | Admitting: Internal Medicine

## 2018-01-10 DIAGNOSIS — R0989 Other specified symptoms and signs involving the circulatory and respiratory systems: Secondary | ICD-10-CM

## 2018-01-14 ENCOUNTER — Encounter: Payer: Self-pay | Admitting: Sports Medicine

## 2018-01-14 ENCOUNTER — Ambulatory Visit (INDEPENDENT_AMBULATORY_CARE_PROVIDER_SITE_OTHER): Payer: Medicare Other

## 2018-01-14 ENCOUNTER — Ambulatory Visit: Payer: Medicare Other | Admitting: Sports Medicine

## 2018-01-14 VITALS — BP 163/76 | HR 80

## 2018-01-14 DIAGNOSIS — E11649 Type 2 diabetes mellitus with hypoglycemia without coma: Secondary | ICD-10-CM | POA: Diagnosis not present

## 2018-01-14 DIAGNOSIS — R58 Hemorrhage, not elsewhere classified: Secondary | ICD-10-CM | POA: Diagnosis not present

## 2018-01-14 DIAGNOSIS — I350 Nonrheumatic aortic (valve) stenosis: Secondary | ICD-10-CM | POA: Diagnosis not present

## 2018-01-14 DIAGNOSIS — I739 Peripheral vascular disease, unspecified: Secondary | ICD-10-CM

## 2018-01-14 NOTE — Progress Notes (Signed)
Subjective: Cameron [name] is a 77 y.o. male patient who presents to office for evaluation of R>L 5th toe pain started 1 month after heart valve replacement. Patient complains of purple color and dry skin. Patient has not tried anything for relief in symptoms. Patient denies any other pedal complaints. Denies injury/trip/fall/sprain/any causative factors.   Review of Systems  All other systems reviewed and are negative.    Patient Active Problem List   Diagnosis Date Noted  . Acute on chronic diastolic heart failure ([place]) 12/03/2017  . Severe aortic stenosis 12/03/2017  . CKD (chronic kidney disease) stage 4, GFR 15-29 ml/min (HCC) 12/03/2017  . 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 Obesity   . COPD (chronic obstructive pulmonary disease) ([place])   . CKD (chronic kidney disease)   . CAD (coronary artery disease)   . PAF (paroxysmal atrial fibrillation) ([place])   . RBBB   . Diabetes mellitus type 2, uncontrolled ([place]) 11/20/2017    Current Outpatient Medications on File Prior to Visit  Medication Sig Dispense Refill  . amLODipine (NORVASC) 5 MG tablet Take 1 tablet (5 mg total) by mouth daily. 30 tablet 0  . aspirin EC 81 MG EC tablet Take 1 tablet (81 mg total) by mouth daily.    . citalopram (CELEXA) 10 MG tablet Take 1 tablet (10 mg total) by mouth daily. 60 tablet 3  . furosemide (LASIX) 40 MG tablet Take 1 tablet (40 mg total) by mouth 2 (two) times daily. 60 tablet 11  . insulin NPH (HUMULIN N,NOVOLIN N) 100 UNIT/ML injection Inject 60 Units into the skin See admin instructions. twice daily - after lunch and supper    . insulin regular (NOVOLIN R,HUMULIN R) 100 units/mL injection Inject 40 Units into the skin 2 (two) times daily. after lunch and supper    . isosorbide mononitrate (IMDUR) 60 MG 24 hr tablet Take 60-120 mg by mouth 2 (two) times daily. Take 2 tablets (120 mg) by mouth every morning & take 1 tablet (60 mg) by mouth in the evening.    .  metoprolol succinate (TOPROL-XL) 100 MG 24 hr tablet Take 1 tablet (100 mg total) by mouth daily after supper. Take with or immediately following a meal. 60 tablet 3  . nitroGLYCERIN (NITROSTAT) 0.4 MG SL tablet Place 0.4 mg every 5 (five) minutes as needed under the tongue for chest pain.    . Rivaroxaban (XARELTO) 15 MG TABS tablet Take 1 tablet (15 mg total) by mouth daily with supper. 60 tablet 3  . rosuvastatin (CRESTOR) 40 MG tablet Take 40 mg daily after supper by mouth.     Marland Kitchen amoxicillin (AMOXIL) 500 MG capsule Take 4 tablets (2g total) one hour prior to dental appointment (Patient not taking: Reported on 01/14/2018) 4 capsule 12   No current facility-administered medications on file prior to visit.     Allergies  Allergen Reactions  . Pravastatin Other (See Comments)    myalgias  . Cefuroxime Diarrhea  . Ezetimibe Diarrhea    Objective:  General: Alert and oriented x3 in no acute distress  Dermatology: No open lesions bilateral lower extremities, no webspace macerations, + ecchymosis bilateral right>left 5th toe, all nails x 10 are well manicured.  Vascular: Dorsalis Pedis and Posterior Tibial pedal pulses non- palpable, Capillary Fill Time 5 seconds,(-) pedal hair growth bilateral, no edema bilateral lower extremities, Temperature gradient within normal limits.  Neurology: Gross sensation intact via light touch bilateral. (- )Tinels sign bilateral.  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  Xrays  Right  Foot   Impression: Arterial calcifications, mild swelling, no other acute findings. No fracture.   Assessment and Plan: Problem List Items Addressed This Visit      Cardiovascular and Mediastinum   Severe aortic stenosis     Endocrine   Diabetes mellitus type 2, uncontrolled ([place])    Other Visit Diagnoses    Ecchymosis    -  Primary   Relevant Orders   DG Foot Complete Right    PAD (peripheral artery disease) ([place])           -Complete examination performed -Xrays reviewed -Discussed treatement options for likely ecchymosis secondary to PAD -Patient to have ABIs tomorrow and advised patient to follow up with Cardiologist or PCP who ordered it -Patient to return to office as needed or sooner if condition worsens.  Landis Martins, DPM

## 2018-01-15 ENCOUNTER — Ambulatory Visit
Admission: RE | Admit: 2018-01-15 | Discharge: 2018-01-15 | Disposition: A | Discharge status: Home / Self Care | Payer: Medicare Other | Source: Ambulatory Visit | Attending: Internal Medicine | Admitting: Internal Medicine

## 2018-01-15 DIAGNOSIS — R0989 Other specified symptoms and signs involving the circulatory and respiratory systems: Secondary | ICD-10-CM

## 2018-01-16 ENCOUNTER — Encounter: Payer: Self-pay | Admitting: Thoracic Surgery (Cardiothoracic Vascular Surgery)

## 2018-01-21 ENCOUNTER — Encounter: Payer: Self-pay | Admitting: Sports Medicine

## 2018-01-21 ENCOUNTER — Ambulatory Visit: Payer: Medicare Other | Admitting: Sports Medicine

## 2018-01-21 DIAGNOSIS — L853 Xerosis cutis: Secondary | ICD-10-CM | POA: Diagnosis not present

## 2018-01-21 DIAGNOSIS — E11649 Type 2 diabetes mellitus with hypoglycemia without coma: Secondary | ICD-10-CM

## 2018-01-21 DIAGNOSIS — R58 Hemorrhage, not elsewhere classified: Secondary | ICD-10-CM

## 2018-01-21 DIAGNOSIS — I350 Nonrheumatic aortic (valve) stenosis: Secondary | ICD-10-CM | POA: Diagnosis not present

## 2018-01-21 DIAGNOSIS — I739 Peripheral vascular disease, unspecified: Secondary | ICD-10-CM

## 2018-01-21 MED ORDER — AMMONIUM LACTATE 12 % EX LOTN: 1.0000 "application " | TOPICAL_LOTION | CUTANEOUS | 0 refills | Status: DC | PRN

## 2018-01-22 ENCOUNTER — Encounter: Payer: Self-pay | Admitting: Sports Medicine

## 2018-01-22 NOTE — Progress Notes (Signed)
Subjective: Cameron [name] is a 77 y.o. diabetic male patient who returns to office for follow-up evaluation of R>L 5th toe pain and discoloration. Patient reports that his vascular doctor has decided to proceed with intervention, patient is also here to update me on current plan of care and also to discuss offloading to prevent ulceration to affected areas.  Patient reports that he is afraid to have vascular procedure and he is afraid that he may lose his leg.  Patient admits occasional sharp shooting pain even when at rest/sitting.  Reports blood sugars were 80 this morning and last A1c was 8.6.  Patient Active Problem List   Diagnosis Date Noted  . Acute on chronic diastolic heart failure ([place]) 12/03/2017  . Severe aortic stenosis 12/03/2017  . CKD (chronic kidney disease) stage 4, GFR 15-29 ml/min (HCC) 12/03/2017  . 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 Obesity   . COPD (chronic obstructive pulmonary disease) ([place])   . CKD (chronic kidney disease)   . CAD (coronary artery disease)   . PAF (paroxysmal atrial fibrillation) ([place])   . RBBB   . Diabetes mellitus type 2, uncontrolled ([place]) 11/20/2017    Current Outpatient Medications on File Prior to Visit  Medication Sig Dispense Refill  . amLODipine (NORVASC) 5 MG tablet Take 1 tablet (5 mg total) by mouth daily. (Patient taking differently: Take 10 mg by mouth daily. ) 30 tablet 0  . amoxicillin (AMOXIL) 500 MG capsule Take 4 tablets (2g total) one hour prior to dental appointment (Patient taking differently: Take 2,000 mg by mouth See admin instructions. Take 4 tablets (2g total) one hour prior to dental appointment) 4 capsule 12  . aspirin EC 81 MG EC tablet Take 1 tablet (81 mg total) by mouth daily.    . citalopram (CELEXA) 10 MG tablet Take 1 tablet (10 mg total) by mouth daily. 60 tablet 3  . furosemide (LASIX) 40 MG tablet Take 1 tablet (40 mg total) by mouth 2 (two) times daily. 60 tablet 11  . insulin  NPH (HUMULIN N,NOVOLIN N) 100 UNIT/ML injection Inject 70 Units into the skin See admin instructions. Inject 70 units SQ twice daily - after lunch and supper    . insulin regular (NOVOLIN R,HUMULIN R) 100 units/mL injection Inject 30 Units into the skin See admin instructions. Inject 30 units SQ twice daily after lunch and supper    . metoprolol succinate (TOPROL-XL) 100 MG 24 hr tablet Take 1 tablet (100 mg total) by mouth daily after supper. Take with or immediately following a meal. 60 tablet 3  . nitroGLYCERIN (NITROSTAT) 0.4 MG SL tablet Place 0.4 mg every 5 (five) minutes as needed under the tongue for chest pain.    . Rivaroxaban (XARELTO) 15 MG TABS tablet Take 1 tablet (15 mg total) by mouth daily with supper. 60 tablet 3  . rosuvastatin (CRESTOR) 40 MG tablet Take 40 mg daily after supper by mouth.      No current facility-administered medications on file prior to visit.     Allergies  Allergen Reactions  . Pravastatin Other (See Comments)    myalgias  . Cefuroxime Diarrhea  . Ezetimibe Diarrhea    Objective:  General: Alert and oriented x3 in no acute distress  Dermatology: No open lesions bilateral lower extremities, no webspace macerations, + ecchymosis bilateral right>left 5th toe as previous, dry skin to anterior and posterior lower extremity right greater than left, all nails x 10 are well manicured.  Vascular:  Dorsalis Pedis and Posterior Tibial pedal pulses non- palpable, Capillary Fill Time 5 seconds,(-) pedal hair growth bilateral, no edema bilateral lower extremities, Temperature gradient within normal limits.  Neurology: Gross sensation intact via light touch bilateral. (- )Tinels sign bilateral.   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  Assessment and Plan: Problem List Items Addressed This Visit      Cardiovascular and Mediastinum   Severe aortic stenosis     Endocrine    Diabetes mellitus type 2, uncontrolled ([place])    Other Visit Diagnoses    Ecchymosis    -  Primary   PAD (peripheral artery disease) ([facility])       Dry skin       Relevant Medications   ammonium lactate (AMLACTIN) 12 % lotion      -Complete examination performed -Discussed continued care for ecchymosis secondary to PAD with stenosis in the setting of diabetes -Patient to continue with planned vascular intervention with Dr. Virgina Jock -Dispensed prevalon boot to use when sleeping or lying down to prevent excessive pressure or ulceration to involved areas -Advised good supportive shoes that are wide but prevent rubbing or irritation to involved areas -Prescribed AmLactin to use to dry skin areas to prevent ulceration -Patient to return to office after vascular intervention or sooner if condition worsens.  Landis Martins, DPM

## 2018-01-23 ENCOUNTER — Encounter: Payer: Self-pay | Admitting: Endocrinology

## 2018-01-23 ENCOUNTER — Ambulatory Visit (INDEPENDENT_AMBULATORY_CARE_PROVIDER_SITE_OTHER): Payer: Medicare Other | Admitting: Endocrinology

## 2018-01-23 DIAGNOSIS — N184 Chronic kidney disease, stage 4 (severe): Secondary | ICD-10-CM | POA: Diagnosis not present

## 2018-01-23 DIAGNOSIS — E1122 Type 2 diabetes mellitus with diabetic chronic kidney disease: Secondary | ICD-10-CM | POA: Diagnosis not present

## 2018-01-23 DIAGNOSIS — Z794 Long term (current) use of insulin: Secondary | ICD-10-CM | POA: Diagnosis not present

## 2018-01-23 MED ORDER — INSULIN NPH (HUMAN) (ISOPHANE) 100 UNIT/ML ~~LOC~~ SUSP: 50.0000 [IU] | Freq: Every day | SUBCUTANEOUS | 11 refills | Status: DC

## 2018-01-23 MED ORDER — GLUCOSE BLOOD VI STRP: 1.0000 | ORAL_STRIP | Freq: Two times a day (BID) | 12 refills | Status: DC

## 2018-01-23 MED ORDER — INSULIN REGULAR HUMAN 100 UNIT/ML IJ SOLN: INTRAMUSCULAR | 11 refills | Status: DC

## 2018-01-23 NOTE — Patient Instructions (Signed)
good diet and exercise significantly improve the control of your diabetes.  please let me know if you wish to be referred to a dietician.  high blood sugar is very risky to your health.  you should see an eye doctor and dentist every year.  It is very important to get all recommended vaccinations.  Controlling your blood pressure and cholesterol drastically reduces the damage diabetes does to your body.  Those who smoke should quit.  Please discuss these with your doctor.  check your blood sugar twice a day.  vary the time of day when you check, between before the 3 meals, and at bedtime.  also check if you have symptoms of your blood sugar being too high or too low.  please keep a record of the readings and bring it to your next appointment here (or you can bring the meter itself).  You can write it on any piece of paper.  please call us sooner if your blood sugar goes below 70, or if you have a lot of readings over 200.   For now, please change the insulins to the numbers listed below.   Please call or message [place] next week, to tell us how the blood sugar is doing.   Please come back for a follow-up appointment in 2 weeks.

## 2018-01-23 NOTE — Progress Notes (Signed)
Subjective:    Patient ID: Cameron [name], male    DOB: Jan 10, 1941, 77 y.o.   MRN: [id number]  HPI pt is referred by Dr Olen Pel, for diabetes.  Pt states DM was dx'ed in 1998; he has mild if any neuropathy of the lower extremities; however, he has associated renal failure, PAD, and CAD; he has been on insulin since 2010; pt says his diet is fair, but exercise is limited by health probs; he has never had pancreatitis, pancreatic surgery, severe hypoglycemia or DKA.  He takes human insulin, due to cost.  He takes NPH, 70 units and Reg, 30 units BID.  He brings a record of his fasting cbg's which I have reviewed today.  It varies from 73-200's.  Past Medical History:  Diagnosis Date  . CAD (coronary artery disease)    a. 2002: s/p CABG x 4V (LIMA-LAD, seq SVG-PDA, PLA, seq SVG-OM1, OM2, SVG-Diag1)  . CKD (chronic kidney disease)   . COPD (chronic obstructive pulmonary disease) ([place])   . Diabetes mellitus without complication ([place])   . HLD (hyperlipidemia)   . Hypertension   . Obesity   . PAF (paroxysmal atrial fibrillation) ([place])   . RBBB   . 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 Severe aortic stenosis     Past Surgical History:  Procedure Laterality Date  . CARDIAC SURGERY    . CORONARY BALLOON ANGIOPLASTY N/A 09/23/2017   Procedure: CORONARY BALLOON ANGIOPLASTY;  Surgeon: Nigel Mormon, MD;  Location: [facility];  Service: Cardiovascular;  Laterality: N/A;  . FINGER AMPUTATION    . HERNIA REPAIR    . open heart surgery    . RIGHT HEART CATH AND CORONARY/GRAFT ANGIOGRAPHY N/A 09/17/2017   Procedure: RIGHT HEART CATH AND CORONARY/GRAFT ANGIOGRAPHY;  Surgeon: Nigel Mormon, MD;  Location: [facility];  Service: Cardiovascular;  Laterality: N/A;  . TEE WITHOUT CARDIOVERSION N/A 12/03/2017   Procedure: TRANSESOPHAGEAL ECHOCARDIOGRAM (TEE);  Surgeon: Sherren Mocha, MD;  Location: [facility];  Service: Open Heart Surgery;  Laterality: N/A;  . TOE AMPUTATION    . TRANSCATHETER AORTIC VALVE REPLACEMENT, TRANSFEMORAL N/A 12/03/2017   Procedure: TRANSCATHETER AORTIC VALVE REPLACEMENT, TRANSFEMORAL;  Surgeon: Sherren Mocha, MD;  Location: [facility];  Service: Open Heart Surgery;  Laterality: N/A;  . ULTRASOUND GUIDANCE FOR VASCULAR ACCESS  09/17/2017   Procedure: Ultrasound Guidance For Vascular Access;  Surgeon: Nigel Mormon, MD;  Location: [facility];  Service: Cardiovascular;;    Social History   Socioeconomic History  . Marital status: Married    Spouse name: Not on file  . Number of children: 2  . Years of education: Not on file  . Highest education level: Not on file  Occupational History  . Occupation: Doctor, general practice  Social Needs  . Financial resource strain: Not on file  . Food insecurity:    Worry: Not on file    Inability: Not on file  . Transportation needs:    Medical: Not on file    Non-medical: Not on file  Tobacco Use  . Smoking status: Former Smoker    Packs/day: 2.00    Years: 20.00    Pack years: 40.00    Types: Cigarettes    Last attempt to quit: 09/28/1978    Years since quitting: 39.3  . Smokeless tobacco: Never Used  Substance and Sexual Activity  . Alcohol  use: No  . Drug use: No  . Sexual activity: Not on file  Lifestyle  . Physical activity:    Days per week: Not on file    Minutes per session: Not on file  . Stress: Not on file  Relationships  . Social connections:    Talks on phone: Not on file    Gets together: Not on file    Attends religious service: Not on file    Active member of club or organization: Not on file    Attends meetings of clubs or organizations: Not on file    Relationship status: Not on file  . Intimate partner violence:    Fear of current or ex partner: Not on file    Emotionally abused: Not on file    Physically abused: Not on file    Forced sexual  activity: Not on file  Other Topics Concern  . Not on file  Social History Narrative  . Not on file    Current Outpatient Medications on File Prior to Visit  Medication Sig Dispense Refill  . amLODipine (NORVASC) 5 MG tablet Take 1 tablet (5 mg total) by mouth daily. (Patient taking differently: Take 10 mg by mouth daily. ) 30 tablet 0  . ammonium lactate (AMLACTIN) 12 % lotion Apply 1 application topically as needed for dry skin. Recommend twice daily until dry skin improved 500 g 0  . amoxicillin (AMOXIL) 500 MG capsule Take 4 tablets (2g total) one hour prior to dental appointment (Patient taking differently: Take 2,000 mg by mouth See admin instructions. Take 4 tablets (2g total) one hour prior to dental appointment) 4 capsule 12  . aspirin EC 81 MG EC tablet Take 1 tablet (81 mg total) by mouth daily.    . citalopram (CELEXA) 10 MG tablet Take 1 tablet (10 mg total) by mouth daily. 60 tablet 3  . furosemide (LASIX) 40 MG tablet Take 1 tablet (40 mg total) by mouth 2 (two) times daily. 60 tablet 11  . metoprolol succinate (TOPROL-XL) 100 MG 24 hr tablet Take 1 tablet (100 mg total) by mouth daily after supper. Take with or immediately following a meal. 60 tablet 3  . nitroGLYCERIN (NITROSTAT) 0.4 MG SL tablet Place 0.4 mg every 5 (five) minutes as needed under the tongue for chest pain.    . Rivaroxaban (XARELTO) 15 MG TABS tablet Take 1 tablet (15 mg total) by mouth daily with supper. 60 tablet 3  . rosuvastatin (CRESTOR) 40 MG tablet Take 40 mg daily after supper by mouth.      No current facility-administered medications on file prior to visit.     Allergies  Allergen Reactions  . Pravastatin Other (See Comments)    myalgias  . Cefuroxime Diarrhea  . Ezetimibe Diarrhea    Family History  Problem Relation Age of Onset  . Diabetes Mother   . Heart disease Father   . Stomach cancer Paternal Grandfather   . Lung cancer Maternal Grandmother        smoked    BP (!) 162/86 (BP  Location: Left Arm, Patient Position: Sitting, Cuff Size: Normal)   Pulse 95   Wt 213 lb 9.6 oz (96.9 kg)   SpO2 95%   BMI 31.54 kg/m    Review of Systems denies weight loss, blurry vision, headache, chest pain, n/v, muscle cramps, excessive diaphoresis, memory loss, rhinorrhea, and easy bruising.  Dyspnea is improved recently.  He has cold intolerance and nocturia.  He has depression,  but no SI.     Objective:   Physical Exam VS: see vs page GEN: no distress HEAD: head: no deformity eyes: no periorbital swelling, no proptosis external nose and ears are normal mouth: no lesion seen NECK: supple, thyroid is not enlarged CHEST WALL: no deformity.  Old healed midline surgical scar LUNGS: clear to auscultation CV: reg rate and rhythm, no murmur ABD: abdomen is soft, nontender.  no hepatosplenomegaly.  not distended.  Large self-reducing ventral hernia.  MUSCULOSKELETAL: muscle bulk and strength are grossly normal.  no obvious joint swelling.  gait is normal and steady.  EXTEMITIES: no deformity.  no ulcer on the feet, but the skin is dry.  feet are of normal color and temp.  1+ bilat leg edema.  There is bilateral onychomycosis of the toenails.  Old healed surgical scar (vein harvest) at the right leg.   PULSES: dorsalis pedis intact bilat.  Right carotid bruit is noted NEURO:  cn 2-12 grossly intact.   readily moves all 4's.  sensation is intact to touch on the feet.  SKIN:  Normal texture and temperature.  No rash or suspicious lesion is visible.  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  NODES:  None palpable at the neck PSYCH: alert, well-oriented.  Does not appear anxious nor depressed.   Lab Results  Component Value Date   HGBA1C 8.6 (H) 11/29/2017   Lab Results  Component Value Date   CREATININE 3.06 (H) 12/12/2017   BUN 44 (H) 12/12/2017   NA 140 12/12/2017   K 4.2 12/12/2017   CL 97 12/12/2017   CO2 25 12/12/2017   I have reviewed outside records, and summarized: Pt was noted to  have elevated a1c, and referred here.  Most recent intervention was TAVR for severe aortic stenosis.     Assessment & Plan:  Insulin-requiring type 2 DM, with renal failure, new to me: he needs increased rx.     Patient Instructions  good diet and exercise significantly improve the control of your diabetes.  please let me know if you wish to be referred to a dietician.  high blood sugar is very risky to your health.  you should see an eye doctor and dentist every year.  It is very important to get all recommended vaccinations.  Controlling your blood pressure and cholesterol drastically reduces the damage diabetes does to your body.  Those who smoke should quit.  Please discuss these with your doctor.  check your blood sugar twice a day.  vary the time of day when you check, between before the 3 meals, and at bedtime.  also check if you have symptoms of your blood sugar being too high or too low.  please keep a record of the readings and bring it to your next appointment here (or you can bring the meter itself).  You can write it on any piece of paper.  please call us sooner if your blood sugar goes below 70, or if you have a lot of readings over 200.   For now, please change the insulins to the numbers listed below.   Please call or message [place] next week, to tell us how the blood sugar is doing.   Please come back for a follow-up appointment in 2 weeks.

## 2018-01-25 DIAGNOSIS — E119 Type 2 diabetes mellitus without complications: Secondary | ICD-10-CM | POA: Insufficient documentation

## 2018-01-27 ENCOUNTER — Telehealth: Payer: Self-pay | Admitting: Endocrinology

## 2018-01-27 DIAGNOSIS — I739 Peripheral vascular disease, unspecified: Secondary | ICD-10-CM

## 2018-01-27 DIAGNOSIS — R6889 Other general symptoms and signs: Secondary | ICD-10-CM
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
## 2018-01-27 NOTE — Progress Notes (Signed)
Bilateral ABI, segmental pressures 01/15/2018: Rt ABI 0.66 Lt ABI 0.77 Segmental pressures suggest: Significant bilateral aortoiliac inflow disease Diffuse arterial occlusive disease throughout right lower extremity Some degree of femoropopliteal occlusive disease left lower extremity

## 2018-01-27 NOTE — Progress Notes (Signed)
Labs 01/24/2018: H/H 14.6/44.3. MCV 82. Platelets 164 INR 1.2 Glucose 222. BUN/Cr 41/2.79. eGFR 21. Na/K 142/4.6

## 2018-01-27 NOTE — Telephone Encounter (Signed)
Patient is having trouble understanding his medication

## 2018-01-28 ENCOUNTER — Encounter (HOSPITAL_COMMUNITY): Payer: Self-pay | Admitting: General Practice

## 2018-01-28 ENCOUNTER — Other Ambulatory Visit: Payer: Self-pay

## 2018-01-28 ENCOUNTER — Ambulatory Visit (HOSPITAL_COMMUNITY)
Admission: RE | Disposition: A | Discharge status: Home / Self Care | Payer: Self-pay | Source: Ambulatory Visit | Attending: Cardiology

## 2018-01-28 ENCOUNTER — Ambulatory Visit (HOSPITAL_COMMUNITY)
Admission: RE | Admit: 2018-01-28 | Discharge: 2018-01-29 | Disposition: A | Discharge status: Home / Self Care | Payer: Medicare Other | Source: Ambulatory Visit | Attending: Cardiology | Admitting: Cardiology

## 2018-01-28 DIAGNOSIS — I452 Bifascicular block: Secondary | ICD-10-CM | POA: Diagnosis not present

## 2018-01-28 DIAGNOSIS — Z7901 Long term (current) use of anticoagulants: Secondary | ICD-10-CM | POA: Diagnosis not present

## 2018-01-28 DIAGNOSIS — N281 Cyst of kidney, acquired: Secondary | ICD-10-CM | POA: Diagnosis not present

## 2018-01-28 DIAGNOSIS — E1151 Type 2 diabetes mellitus with diabetic peripheral angiopathy without gangrene: Secondary | ICD-10-CM | POA: Diagnosis not present

## 2018-01-28 DIAGNOSIS — Z89421 Acquired absence of other right toe(s): Secondary | ICD-10-CM | POA: Insufficient documentation

## 2018-01-28 DIAGNOSIS — I2510 Atherosclerotic heart disease of native coronary artery without angina pectoris: Secondary | ICD-10-CM | POA: Diagnosis not present

## 2018-01-28 DIAGNOSIS — I70211 Atherosclerosis of native arteries of extremities with intermittent claudication, right leg: Secondary | ICD-10-CM | POA: Insufficient documentation

## 2018-01-28 DIAGNOSIS — I129 Hypertensive chronic kidney disease with stage 1 through stage 4 chronic kidney disease, or unspecified chronic kidney disease: Secondary | ICD-10-CM | POA: Insufficient documentation

## 2018-01-28 DIAGNOSIS — Z794 Long term (current) use of insulin: Secondary | ICD-10-CM | POA: Insufficient documentation

## 2018-01-28 DIAGNOSIS — Z87891 Personal history of nicotine dependence: Secondary | ICD-10-CM | POA: Insufficient documentation

## 2018-01-28 DIAGNOSIS — I272 Pulmonary hypertension, unspecified: Secondary | ICD-10-CM | POA: Diagnosis not present

## 2018-01-28 DIAGNOSIS — Z8249 Family history of ischemic heart disease and other diseases of the circulatory system: Secondary | ICD-10-CM | POA: Diagnosis not present

## 2018-01-28 DIAGNOSIS — N184 Chronic kidney disease, stage 4 (severe): Secondary | ICD-10-CM | POA: Diagnosis not present

## 2018-01-28 DIAGNOSIS — I739 Peripheral vascular disease, unspecified: Secondary | ICD-10-CM

## 2018-01-28 DIAGNOSIS — Z9862 Peripheral vascular angioplasty status: Secondary | ICD-10-CM | POA: Diagnosis present

## 2018-01-28 DIAGNOSIS — I480 Paroxysmal atrial fibrillation: Secondary | ICD-10-CM | POA: Insufficient documentation

## 2018-01-28 DIAGNOSIS — Z7982 Long term (current) use of aspirin: Secondary | ICD-10-CM | POA: Insufficient documentation

## 2018-01-28 DIAGNOSIS — Z951 Presence of aortocoronary bypass graft: Secondary | ICD-10-CM | POA: Insufficient documentation

## 2018-01-28 DIAGNOSIS — E1122 Type 2 diabetes mellitus with diabetic chronic kidney disease: Secondary | ICD-10-CM | POA: Insufficient documentation

## 2018-01-28 DIAGNOSIS — R6889 Other general symptoms and signs: Secondary | ICD-10-CM

## 2018-01-28 DIAGNOSIS — E782 Mixed hyperlipidemia: Secondary | ICD-10-CM | POA: Insufficient documentation

## 2018-01-28 DIAGNOSIS — Z952 Presence of prosthetic heart valve: Secondary | ICD-10-CM | POA: Diagnosis not present

## 2018-01-28 HISTORY — PX: ABDOMINAL AORTAGRAM: SHX5706

## 2018-01-28 HISTORY — PX: PERIPHERAL VASCULAR BALLOON ANGIOPLASTY: CATH118281

## 2018-01-28 HISTORY — PX: ABDOMINAL AORTOGRAM W/LOWER EXTREMITY: CATH118223

## 2018-01-28 LAB — GLUCOSE, CAPILLARY
GLUCOSE-CAPILLARY: 88 mg/dL (ref 65–99)
Glucose-Capillary: 127 mg/dL — ABNORMAL HIGH (ref 65–99)
Glucose-Capillary: 199 mg/dL — ABNORMAL HIGH (ref 65–99)
Glucose-Capillary: 234 mg/dL — ABNORMAL HIGH (ref 65–99)

## 2018-01-28 LAB — SURGICAL PCR SCREEN
MRSA, PCR: NEGATIVE
Staphylococcus aureus: NEGATIVE

## 2018-01-28 LAB — POCT ACTIVATED CLOTTING TIME: ACTIVATED CLOTTING TIME: 285 s

## 2018-01-28 SURGERY — ABDOMINAL AORTOGRAM W/LOWER EXTREMITY
Anesthesia: LOCAL

## 2018-01-28 MED ORDER — HEPARIN SODIUM (PORCINE) 1000 UNIT/ML IJ SOLN
INTRAMUSCULAR | Status: AC
  Filled 2018-01-28: qty 1

## 2018-01-28 MED ORDER — LIDOCAINE HCL 1 % IJ SOLN
INTRAMUSCULAR | Status: AC
  Filled 2018-01-28: qty 20

## 2018-01-28 MED ORDER — CITALOPRAM HYDROBROMIDE 10 MG PO TABS
10.0000 mg | ORAL_TABLET | Freq: Every day | ORAL | Status: DC
  Administered 2018-01-29: 10 mg via ORAL
  Filled 2018-01-28: qty 1

## 2018-01-28 MED ORDER — INSULIN NPH (HUMAN) (ISOPHANE) 100 UNIT/ML ~~LOC~~ SUSP
50.0000 [IU] | Freq: Every day | SUBCUTANEOUS | Status: DC
  Administered 2018-01-28: 22:00:00 50 [IU] via SUBCUTANEOUS
  Filled 2018-01-28: qty 10

## 2018-01-28 MED ORDER — CLOPIDOGREL BISULFATE 300 MG PO TABS
ORAL_TABLET | ORAL | Status: DC | PRN
  Administered 2018-01-28: 300 mg via ORAL

## 2018-01-28 MED ORDER — ONDANSETRON HCL 4 MG/2ML IJ SOLN: 4.0000 mg | Freq: Four times a day (QID) | INTRAMUSCULAR | Status: DC | PRN

## 2018-01-28 MED ORDER — ROSUVASTATIN CALCIUM 40 MG PO TABS
40.0000 mg | ORAL_TABLET | Freq: Every day | ORAL | Status: DC
  Administered 2018-01-28: 20:00:00 40 mg via ORAL
  Filled 2018-01-28: qty 1

## 2018-01-28 MED ORDER — MIDAZOLAM HCL 2 MG/2ML IJ SOLN
INTRAMUSCULAR | Status: DC | PRN
  Administered 2018-01-28: 1 mg via INTRAVENOUS

## 2018-01-28 MED ORDER — SODIUM CHLORIDE 0.9% FLUSH: 3.0000 mL | INTRAVENOUS | Status: DC | PRN

## 2018-01-28 MED ORDER — FENTANYL CITRATE (PF) 100 MCG/2ML IJ SOLN
INTRAMUSCULAR | Status: AC
  Filled 2018-01-28: qty 2

## 2018-01-28 MED ORDER — METOPROLOL SUCCINATE ER 100 MG PO TB24
100.0000 mg | ORAL_TABLET | Freq: Every day | ORAL | Status: DC
  Administered 2018-01-28: 20:00:00 100 mg via ORAL
  Filled 2018-01-28 (×2): qty 1

## 2018-01-28 MED ORDER — HEPARIN (PORCINE) IN NACL 2-0.9 UNIT/ML-% IJ SOLN
INTRAMUSCULAR | Status: AC
  Filled 2018-01-28: qty 500

## 2018-01-28 MED ORDER — SODIUM CHLORIDE 0.9 % IV SOLN: INTRAVENOUS | Status: AC

## 2018-01-28 MED ORDER — SODIUM CHLORIDE 0.9 % IV SOLN: 250.0000 mL | INTRAVENOUS | Status: DC | PRN

## 2018-01-28 MED ORDER — HEPARIN SODIUM (PORCINE) 1000 UNIT/ML IJ SOLN
INTRAMUSCULAR | Status: DC | PRN
  Administered 2018-01-28: 2000 [IU] via INTRAVENOUS
  Administered 2018-01-28: 8000 [IU] via INTRAVENOUS

## 2018-01-28 MED ORDER — HEPARIN (PORCINE) IN NACL 2-0.9 UNIT/ML-% IJ SOLN
INTRAMUSCULAR | Status: AC | PRN
  Administered 2018-01-28 (×2): 500 mL

## 2018-01-28 MED ORDER — FENTANYL CITRATE (PF) 100 MCG/2ML IJ SOLN
INTRAMUSCULAR | Status: DC | PRN
  Administered 2018-01-28 (×2): 50 ug via INTRAVENOUS

## 2018-01-28 MED ORDER — IODIXANOL 320 MG/ML IV SOLN
INTRAVENOUS | Status: DC | PRN
  Administered 2018-01-28: 40 mL via INTRAVENOUS

## 2018-01-28 MED ORDER — INSULIN ASPART 100 UNIT/ML ~~LOC~~ SOLN
15.0000 [IU] | Freq: Three times a day (TID) | SUBCUTANEOUS | Status: DC
  Administered 2018-01-28 – 2018-01-29 (×2): 15 [IU] via SUBCUTANEOUS

## 2018-01-28 MED ORDER — MIDAZOLAM HCL 2 MG/2ML IJ SOLN
INTRAMUSCULAR | Status: AC
  Filled 2018-01-28: qty 2

## 2018-01-28 MED ORDER — CLOPIDOGREL BISULFATE 300 MG PO TABS
ORAL_TABLET | ORAL | Status: AC
  Filled 2018-01-28: qty 1

## 2018-01-28 MED ORDER — LIDOCAINE HCL (PF) 1 % IJ SOLN
INTRAMUSCULAR | Status: DC | PRN
  Administered 2018-01-28: 2 mL

## 2018-01-28 MED ORDER — AMLODIPINE BESYLATE 10 MG PO TABS
10.0000 mg | ORAL_TABLET | Freq: Every day | ORAL | Status: DC
  Administered 2018-01-28 – 2018-01-29 (×2): 10 mg via ORAL
  Filled 2018-01-28 (×2): qty 1

## 2018-01-28 MED ORDER — SODIUM CHLORIDE 0.9% FLUSH: 3.0000 mL | Freq: Two times a day (BID) | INTRAVENOUS | Status: DC

## 2018-01-28 MED ORDER — ACETAMINOPHEN 325 MG PO TABS: 650.0000 mg | ORAL_TABLET | ORAL | Status: DC | PRN

## 2018-01-28 MED ORDER — HEPARIN (PORCINE) IN NACL 2-0.9 UNIT/ML-% IJ SOLN
INTRAMUSCULAR | Status: AC
  Filled 2018-01-28: qty 1000

## 2018-01-28 MED ORDER — INSULIN ASPART 100 UNIT/ML ~~LOC~~ SOLN
0.0000 [IU] | Freq: Three times a day (TID) | SUBCUTANEOUS | Status: DC
  Administered 2018-01-28: 3 [IU] via SUBCUTANEOUS
  Administered 2018-01-29: 11:00:00 2 [IU] via SUBCUTANEOUS

## 2018-01-28 SURGICAL SUPPLY — 32 items
BALLN CHOCOLATE 6.0X40X120 (BALLOONS) ×3
BALLOON CHOCOLATE 6.0X40X120 (BALLOONS) IMPLANT
CATH NAVICROSS ST .035X90CM (MICROCATHETER) ×1 IMPLANT
CATH OMNI FLUSH 5F 65CM (CATHETERS) ×1 IMPLANT
CATH SOFT-VU 4F 65 STRAIGHT (CATHETERS) IMPLANT
CATH SOFT-VU STRAIGHT 4F 65CM (CATHETERS) ×3
CATH STRAIGHT 5FR 65CM (CATHETERS) ×1 IMPLANT
COVER PRB 48X5XTLSCP FOLD TPE (BAG) IMPLANT
COVER PROBE 5X48 (BAG) ×3
DEVICE CLOSURE MYNXGRIP 6/7F (Vascular Products) ×1 IMPLANT
DEVICE TORQUE .025-.038 (MISCELLANEOUS) ×1 IMPLANT
FILTER CO2 0.2 MICRON (VASCULAR PRODUCTS) ×1 IMPLANT
GLIDEWIRE ADV .035X260CM (WIRE) ×1 IMPLANT
GUIDEWIRE ANGLED .035X150CM (WIRE) ×1 IMPLANT
KIT ENCORE 26 ADVANTAGE (KITS) ×1 IMPLANT
KIT HEART LEFT (KITS) ×3 IMPLANT
KIT MICROPUNCTURE NIT STIFF (SHEATH) ×1 IMPLANT
KIT PV (KITS) ×3 IMPLANT
PACK CARDIAC CATHETERIZATION (CUSTOM PROCEDURE TRAY) ×3 IMPLANT
RESERVOIR CO2 (VASCULAR PRODUCTS) ×2 IMPLANT
SET FLUSH CO2 (MISCELLANEOUS) ×2 IMPLANT
SHEATH AVANTI 11CM 5FR (SHEATH) ×1 IMPLANT
SHEATH AVANTI 11CM 6FR (SHEATH) ×1 IMPLANT
SHEATH HIGHFLEX ANSEL 6FRX55 (SHEATH) ×1 IMPLANT
SHIELD RADPAD SCOOP 12X17 (MISCELLANEOUS) ×1 IMPLANT
SYR MEDRAD MARK V 150ML (SYRINGE) ×3 IMPLANT
TRANSDUCER W/STOPCOCK (MISCELLANEOUS) ×3 IMPLANT
TRAY PV CATH (CUSTOM PROCEDURE TRAY) ×3 IMPLANT
TUBING CIL FLEX 10 FLL-RA (TUBING) ×3 IMPLANT
WIRE BENTSON .035X145CM (WIRE) ×1 IMPLANT
WIRE G V18X300CM (WIRE) ×1 IMPLANT
WIRE ROSEN-J .035X260CM (WIRE) ×1 IMPLANT

## 2018-01-28 NOTE — Progress Notes (Signed)
Rt SFA PTA 6 Fr Mynx closure Lt CFA Full report to follow.  Nigel Mormon, MD [facility] Pager: [phone] Office: [phone] If no answer Cell [phone]

## 2018-01-28 NOTE — Interval H&P Note (Signed)
History and Physical Interval Note:  01/28/2018 10:40 AM  Cameron [name]  has presented today for surgery, with the diagnosis of Claudication  The various methods of treatment have been discussed with the patient and family. After consideration of risks, benefits and other options for treatment, the patient has consented to  Procedure(s): ABDOMINAL AORTOGRAM W/LOWER EXTREMITY Runoff (N/A) as a surgical intervention .  The patient's history has been reviewed, patient examined, no change in status, stable for surgery.  I have reviewed the patient's chart and labs.  Questions were answered to the patient's satisfaction.     [place]

## 2018-01-29 ENCOUNTER — Encounter (HOSPITAL_COMMUNITY): Payer: Self-pay | Admitting: Cardiology

## 2018-01-29 DIAGNOSIS — E1151 Type 2 diabetes mellitus with diabetic peripheral angiopathy without gangrene: Secondary | ICD-10-CM | POA: Diagnosis not present

## 2018-01-29 LAB — BASIC METABOLIC PANEL
Anion gap: 10 (ref 5–15)
BUN: 35 mg/dL — AB (ref 6–20)
CHLORIDE: 106 mmol/L (ref 101–111)
CO2: 25 mmol/L (ref 22–32)
CREATININE: 2.47 mg/dL — AB (ref 0.61–1.24)
Calcium: 8.9 mg/dL (ref 8.9–10.3)
GFR calc Af Amer: 27 mL/min — ABNORMAL LOW (ref >60)
GFR calc non Af Amer: 24 mL/min — ABNORMAL LOW (ref >60)
Glucose, Bld: 130 mg/dL — ABNORMAL HIGH (ref 65–99)
Potassium: 4.3 mmol/L (ref 3.5–5.1)
Sodium: 141 mmol/L (ref 135–145)

## 2018-01-29 LAB — GLUCOSE, CAPILLARY
GLUCOSE-CAPILLARY: 143 mg/dL — AB (ref 65–99)
Glucose-Capillary: 120 mg/dL — ABNORMAL HIGH (ref 65–99)

## 2018-01-29 MED ORDER — HEPARIN (PORCINE) IN NACL 2-0.9 UNIT/ML-% IJ SOLN
INTRAMUSCULAR | Status: AC
  Filled 2018-01-29: qty 1000

## 2018-01-29 MED ORDER — MIDAZOLAM HCL 2 MG/2ML IJ SOLN
INTRAMUSCULAR | Status: AC
  Filled 2018-01-29: qty 2

## 2018-01-29 MED ORDER — FENTANYL CITRATE (PF) 100 MCG/2ML IJ SOLN
INTRAMUSCULAR | Status: AC
  Filled 2018-01-29: qty 2

## 2018-01-29 MED ORDER — CLOPIDOGREL BISULFATE 75 MG PO TABS
75.0000 mg | ORAL_TABLET | Freq: Every day | ORAL | Status: DC
  Administered 2018-01-29: 11:00:00 75 mg via ORAL
  Filled 2018-01-29: qty 1

## 2018-01-29 MED ORDER — CLOPIDOGREL BISULFATE 75 MG PO TABS: 75.0000 mg | ORAL_TABLET | Freq: Every day | ORAL | 3 refills | Status: DC

## 2018-01-29 MED ORDER — LIDOCAINE HCL 1 % IJ SOLN
INTRAMUSCULAR | Status: AC
  Filled 2018-01-29: qty 20

## 2018-01-29 MED ORDER — RIVAROXABAN 15 MG PO TABS: 15.0000 mg | ORAL_TABLET | Freq: Every day | ORAL | Status: DC

## 2018-01-29 NOTE — Discharge Summary (Signed)
Physician Discharge Summary  Patient ID: Cameron [name] MRN: [id number] DOB/AGE: August 21, 1941 77 y.o.  Admit date: 01/28/2018 Discharge date: 01/29/2018  Admission Diagnoses: Peripheral atery disease  Discharge Diagnoses:  Active Problems:   Severe claudication (HCC)   Abnormal ankle brachial index (ABI)   Status post percutaneous transluminal angioplasty (PTA)   Discharged Condition: stable  Hospital Course:  Patient underwent peripheral angiography using carbon dioxide and minimal use of contrast.  He has 2 vessel runoff below the knee.  He had 80% stenosis in the right proximal superficial artery that was treated with balloon angioplasty.  Excellent results with brisk flow with 2 vessel runoff below the knee.  I have instructed him to inspect his foot every day for any signs of new injury, as patient likely has diabetic neuropathy. He was started on Plavix in addition to Xarelto.  We will obtain outpatient ABI with office visit.     Consults: None  Significant Diagnostic Studies: labs:  Results for MATIAS, THURMAN (MRN [id number]) as of 01/29/2018 10:32  Ref. Range 12/12/2017 15:43 01/29/2018 34:91  BASIC METABOLIC PANEL Unknown Rpt (A) Rpt (A)  Sodium Latest Ref Range: 135 - 145 mmol/L 140 141  Potassium Latest Ref Range: 3.5 - 5.1 mmol/L 4.2 4.3  Chloride Latest Ref Range: 101 - 111 mmol/L 97 106  CO2 Latest Ref Range: 22 - 32 mmol/L 25 25  Glucose Latest Ref Range: 65 - 99 mg/dL 289 (H) 130 (H)  BUN Latest Ref Range: 6 - 20 mg/dL 44 (H) 35 (H)  Creatinine Latest Ref Range: 0.61 - 1.24 mg/dL 3.06 (H) 2.47 (H)  Calcium Latest Ref Range: 8.9 - 10.3 mg/dL 8.6 8.9  Anion gap Latest Ref Range: 5 - 15   10  BUN/Creatinine Ratio Latest Ref Range: 10 - 24  14   GFR, Est Non African American Latest Ref Range: >60 mL/min 19 (L) 24 (L)  GFR, Est African American Latest Ref Range: >60 mL/min 22 (L) 27 (L)    Treatments:  Focal prox RT SFA 80% stenosis-->0% residual stenosis after PTA with  excellent flow. 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  2 vessel below the knee runoff.  Discharge Exam: Blood pressure (!) 148/46, pulse 80, temperature 97.6 F (36.4 C), temperature source Oral, resp. rate (!) 7, height 5\' 9"  (1.753 m), weight 96 kg (211 lb 10.3 oz), SpO2 96 %. Physical Exam  Constitutional: He appears well-developed and well-nourished.  HENT:  Head: Normocephalic and atraumatic.  Eyes: Pupils are equal, round, and reactive to light. Conjunctivae are normal.  Neck: Normal range of motion. Neck supple. No JVD present.  Cardiovascular: Normal rate, regular rhythm and normal heart sounds.  Pulses:      Femoral pulses are 2+ on the right side, and 2+ on the left side.      Popliteal pulses are 1+ on the right side, and 1+ on the left side.       Dorsalis pedis pulses are 0 on the right side, and 1+ on the left side.       Posterior tibial pulses are 0 on the right side, and 0 on the left side.  Left groin with no hematoma, ecchymosis  Pulmonary/Chest: No respiratory distress. He has no wheezes. He has no rales.  Abdominal: Soft. Bowel sounds are normal. He exhibits no distension.  Lymphadenopathy:    He has no cervical adenopathy.  Nursing note and vitals reviewed.    Disposition: Discharge  disposition: 01-Home or Self Care       Discharge Instructions    Diet - low sodium heart healthy   Complete by:  As directed    Increase activity slowly   Complete by:  As directed      Allergies as of 01/29/2018      Reactions   Cefuroxime Diarrhea   Ezetimibe Diarrhea      Medication List    TAKE these medications   amLODipine 5 MG tablet Commonly known as:  NORVASC Take 1 tablet (5 mg total) by mouth daily. What changed:  how much to take   ammonium lactate 12 % lotion Commonly known as:  AMLACTIN Apply 1 application topically as needed for dry skin. Recommend twice daily until dry skin improved    amoxicillin 500 MG capsule Commonly known as:  AMOXIL Take 4 tablets (2g total) one hour prior to dental appointment What changed:    how much to take  how to take this  when to take this  additional instructions   aspirin 81 MG EC tablet Take 1 tablet (81 mg total) by mouth daily.   citalopram 10 MG tablet Commonly known as:  CELEXA Take 1 tablet (10 mg total) by mouth daily.   clopidogrel 75 MG tablet Commonly known as:  PLAVIX Take 1 tablet (75 mg total) by mouth daily.   furosemide 40 MG tablet Commonly known as:  LASIX Take 1 tablet (40 mg total) by mouth 2 (two) times daily.   glucose blood test strip Commonly known as:  ONETOUCH VERIO 1 each by Other route 2 (two) times daily. And lancets 2/day   insulin NPH Human 100 UNIT/ML injection Commonly known as:  HUMULIN N,NOVOLIN N Inject 0.5 mLs (50 Units total) into the skin at bedtime.   insulin regular 100 units/mL injection Commonly known as:  NOVOLIN R,HUMULIN R 3 times a day (just before each meal), 50-30-50 units, and syringes 4/day   metoprolol succinate 100 MG 24 hr tablet Commonly known as:  TOPROL-XL Take 1 tablet (100 mg total) by mouth daily after supper. Take with or immediately following a meal.   nitroGLYCERIN 0.4 MG SL tablet Commonly known as:  NITROSTAT Place 0.4 mg every 5 (five) minutes as needed under the tongue for chest pain.   Rivaroxaban 15 MG Tabs tablet Commonly known as:  XARELTO Take 1 tablet (15 mg total) by mouth daily with supper.   rosuvastatin 40 MG tablet Commonly known as:  CRESTOR Take 40 mg daily after supper by mouth.        SignedNigel Mormon 01/29/2018, 10:30 AM   Nigel Mormon, MD [facility] Pager: [phone] Office: [phone] If no answer Cell [phone]

## 2018-01-29 NOTE — Discharge Instructions (Addendum)

## 2018-01-30 NOTE — Telephone Encounter (Signed)
LVM for patient to return my call 

## 2018-01-31 MED FILL — Lidocaine HCl Local Inj 1%: INTRAMUSCULAR | Qty: 20 | Status: AC

## 2018-01-31 MED FILL — Heparin Sodium (Porcine) 2 Unit/ML in Sodium Chloride 0.9%: INTRAMUSCULAR | Qty: 1000 | Status: AC

## 2018-01-31 MED FILL — Heparin Sodium (Porcine) 2 Unit/ML in Sodium Chloride 0.9%: INTRAMUSCULAR | Qty: 500 | Status: AC

## 2018-02-06 ENCOUNTER — Ambulatory Visit (INDEPENDENT_AMBULATORY_CARE_PROVIDER_SITE_OTHER): Payer: Medicare Other | Admitting: Endocrinology

## 2018-02-06 ENCOUNTER — Encounter: Payer: Self-pay | Admitting: Endocrinology

## 2018-02-06 VITALS — BP 148/62 | HR 87 | Wt 213.4 lb

## 2018-02-06 DIAGNOSIS — N184 Chronic kidney disease, stage 4 (severe): Secondary | ICD-10-CM

## 2018-02-06 DIAGNOSIS — Z794 Long term (current) use of insulin: Secondary | ICD-10-CM | POA: Diagnosis not present

## 2018-02-06 DIAGNOSIS — E1122 Type 2 diabetes mellitus with diabetic chronic kidney disease: Secondary | ICD-10-CM | POA: Diagnosis not present

## 2018-02-06 LAB — POCT GLYCOSYLATED HEMOGLOBIN (HGB A1C): HEMOGLOBIN A1C: 8.8

## 2018-02-06 NOTE — Progress Notes (Signed)
Subjective:    Patient ID: Cameron [name], male    DOB: 1941-06-21, 77 y.o.   MRN: [id number]  HPI Pt returns for f/u of diabetes mellitus: DM type: Insulin-requiring type 2 Dx'ed: 4081 Complications: renal failure, PAD, and CAD Therapy: insulin since 2010 DKA: never Severe hypoglycemia: never Pancreatitis: never Pancreatic imaging: normal on 2019 CT Other: he takes multiple daily injections; he takes human insulin, due to cost Interval history:  Pt reports symptoms: none today Pt takes meds as rx'ed. He brings a record of his cbg's which I have reviewed today.  It varies from 96-242.  All are checked fasting.   Past Medical History:  Diagnosis Date  . CAD (coronary artery disease)    a. 2002: s/p CABG x 4V (LIMA-LAD, seq SVG-PDA, PLA, seq SVG-OM1, OM2, SVG-Diag1)  . CKD (chronic kidney disease)   . COPD (chronic obstructive pulmonary disease) ([place])   . Diabetes mellitus without complication ([place])   . HLD (hyperlipidemia)   . Hypertension   . Obesity   . PAF (paroxysmal atrial fibrillation) ([place])   . RBBB   . 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 Severe aortic stenosis     Past Surgical History:  Procedure Laterality Date  . ABDOMINAL AORTAGRAM Right 01/28/2018   LOWER EXTREMITY  . ABDOMINAL AORTOGRAM W/LOWER EXTREMITY N/A 01/28/2018   Procedure: ABDOMINAL AORTOGRAM W/LOWER EXTREMITY Run[place];  Surgeon: Nigel Mormon, MD;  Location: [facility];  Service: Cardiovascular;  Laterality: N/A;  . CARDIAC SURGERY    . CORONARY BALLOON ANGIOPLASTY N/A 09/23/2017   Procedure: CORONARY BALLOON ANGIOPLASTY;  Surgeon: Nigel Mormon, MD;  Location: [facility];  Service: Cardiovascular;  Laterality: N/A;  . FINGER AMPUTATION    . HERNIA REPAIR    . open heart surgery    . PERIPHERAL VASCULAR BALLOON ANGIOPLASTY  01/28/2018   Procedure: PERIPHERAL VASCULAR  BALLOON ANGIOPLASTY;  Surgeon: Nigel Mormon, MD;  Location: [facility];  Service: Cardiovascular;;  Rt SFA  . RIGHT HEART CATH AND CORONARY/GRAFT ANGIOGRAPHY N/A 09/17/2017   Procedure: RIGHT HEART CATH AND CORONARY/GRAFT ANGIOGRAPHY;  Surgeon: Nigel Mormon, MD;  Location: [facility];  Service: Cardiovascular;  Laterality: N/A;  . TEE WITHOUT CARDIOVERSION N/A 12/03/2017   Procedure: TRANSESOPHAGEAL ECHOCARDIOGRAM (TEE);  Surgeon: Sherren Mocha, MD;  Location: [facility];  Service: Open Heart Surgery;  Laterality: N/A;  . TOE AMPUTATION    . TRANSCATHETER AORTIC VALVE REPLACEMENT, TRANSFEMORAL N/A 12/03/2017   Procedure: TRANSCATHETER AORTIC VALVE REPLACEMENT, TRANSFEMORAL;  Surgeon: Sherren Mocha, MD;  Location: [facility];  Service: Open Heart Surgery;  Laterality: N/A;  . ULTRASOUND GUIDANCE FOR VASCULAR ACCESS  09/17/2017   Procedure: Ultrasound Guidance For Vascular Access;  Surgeon: Nigel Mormon, MD;  Location: [facility];  Service: Cardiovascular;;    Social History   Socioeconomic History  . Marital status: Married    Spouse name: Not on file  . Number of children: 2  . Years of education: Not on file  . Highest education level: Not on file  Occupational History  . Occupation: Doctor, general practice  Social Needs  . Financial resource strain: Not on file  . Food insecurity:    Worry: Not on file    Inability: Not on file  . Transportation needs:    Medical: Not on file    Non-medical: Not on file  Tobacco Use  . Smoking status: Former Smoker    Packs/day: 2.00    Years: 20.00    Pack years: 40.00    Types: Cigarettes    Last attempt to quit: 09/28/1978    Years since quitting: 39.3  . Smokeless tobacco: Never Used  Substance and Sexual Activity  . Alcohol use: No  . Drug use: No  . Sexual activity: Not on file  Lifestyle  . Physical activity:    Days per week: Not on file    Minutes per session: Not on file  . Stress: Not  on file  Relationships  . Social connections:    Talks on phone: Not on file    Gets together: Not on file    Attends religious service: Not on file    Active member of club or organization: Not on file    Attends meetings of clubs or organizations: Not on file    Relationship status: Not on file  . Intimate partner violence:    Fear of current or ex partner: Not on file    Emotionally abused: Not on file    Physically abused: Not on file    Forced sexual activity: Not on file  Other Topics Concern  . Not on file  Social History Narrative  . Not on file    Current Outpatient Medications on File Prior to Visit  Medication Sig Dispense Refill  . amLODipine (NORVASC) 5 MG tablet Take 1 tablet (5 mg total) by mouth daily. (Patient taking differently: Take 10 mg by mouth daily. ) 30 tablet 0  . ammonium lactate (AMLACTIN) 12 % lotion Apply 1 application topically as needed for dry skin. Recommend twice daily until dry skin improved 500 g 0  . amoxicillin (AMOXIL) 500 MG capsule Take 4 tablets (2g total) one hour prior to dental appointment (Patient taking differently: Take 2,000 mg by mouth See admin instructions. Take 4 tablets (2g total) one hour prior to dental appointment) 4 capsule 12  . aspirin EC 81 MG EC tablet Take 1 tablet (81 mg total) by mouth daily.    . citalopram (CELEXA) 10 MG tablet Take 1 tablet (10 mg total) by mouth daily. 60 tablet 3  . clopidogrel (PLAVIX) 75 MG tablet Take 1 tablet (75 mg total) by mouth daily. 30 tablet 3  . furosemide (LASIX) 40 MG tablet Take 1 tablet (40 mg total) by mouth 2 (two) times daily. 60 tablet 11  . glucose blood (ONETOUCH VERIO) test strip 1 each by Other route 2 (two) times daily. And lancets 2/day 100 each 12  . insulin NPH Human (HUMULIN N,NOVOLIN N) 100 UNIT/ML injection Inject 0.5 mLs (50 Units total) into the skin at bedtime. 20 mL 11  . insulin regular (NOVOLIN R,HUMULIN R) 100 units/mL injection 3 times a day (just before each  meal), 50-30-50 units, and syringes 4/day 50 mL 11  . metoprolol succinate (TOPROL-XL) 100 MG 24 hr tablet Take 1 tablet (100 mg total) by mouth daily after supper. Take with or immediately following a meal. 60 tablet 3  . nitroGLYCERIN (NITROSTAT) 0.4 MG SL tablet Place 0.4 mg every 5 (five) minutes as needed under the tongue for chest pain.    . Rivaroxaban (XARELTO) 15 MG TABS tablet Take 1 tablet (15 mg total) by mouth daily with supper. 60 tablet 3  . rosuvastatin (CRESTOR) 40 MG tablet Take 40 mg daily after supper by mouth.      No current facility-administered medications on file  prior to visit.     Allergies  Allergen Reactions  . Cefuroxime Diarrhea  . Ezetimibe Diarrhea    Family History  Problem Relation Age of Onset  . Diabetes Mother   . Heart disease Father   . Stomach cancer Paternal Grandfather   . Lung cancer Maternal Grandmother        smoked    BP (!) 148/62 (BP Location: Left Arm, Patient Position: Sitting, Cuff Size: Normal)   Pulse 87   Wt 213 lb 6.4 oz (96.8 kg)   SpO2 93%   BMI 31.51 kg/m   Review of Systems He denies hypoglycemia    Objective:   Physical Exam VITAL SIGNS:  See vs page GENERAL: no distress EXTEMITIES: no deformity.  no ulcer on the feet, but the skin is dry.  feet are of normal color and temp.  There is bilateral onychomycosis of the toenails.  CV: 1+ bilat leg edema.     PULSES: dorsalis pedis intact bilat.  NEURO:  sensation is intact to touch on the feet. SKIN: slight erythema of both legs.  Old healed surgical scar (vein harvest) at the right leg.  Lab Results  Component Value Date   HGBA1C 8.8 02/06/2018      Assessment & Plan:  Insulin-requiring type 2 DM, with PAD: I need more cbg info in order to safely adjust insulin.   Patient Instructions  check your blood sugar twice a day.  vary the time of day when you check, between before the 3 meals, and at bedtime.  also check if you have symptoms of your blood sugar  being too high or too low.  please keep a record of the readings and bring it to your next appointment here (or you can bring the meter itself).  You can write it on any piece of paper.  please call us sooner if your blood sugar goes below 70, or if you have a lot of readings over 200.   Please continue the same insulins for now.   Please call or message [place] next week, to tell us how the blood sugar is doing.   Please come back for a follow-up appointment in 6 weeks.

## 2018-02-06 NOTE — Patient Instructions (Addendum)
check your blood sugar twice a day.  vary the time of day when you check, between before the 3 meals, and at bedtime.  also check if you have symptoms of your blood sugar being too high or too low.  please keep a record of the readings and bring it to your next appointment here (or you can bring the meter itself).  You can write it on any piece of paper.  please call us sooner if your blood sugar goes below 70, or if you have a lot of readings over 200.   Please continue the same insulins for now.   Please call or message [place] next week, to tell us how the blood sugar is doing.   Please come back for a follow-up appointment in 6 weeks.

## 2018-02-16 ENCOUNTER — Other Ambulatory Visit: Payer: Self-pay

## 2018-02-16 ENCOUNTER — Encounter (HOSPITAL_COMMUNITY): Payer: Self-pay | Admitting: Emergency Medicine

## 2018-02-16 ENCOUNTER — Emergency Department (HOSPITAL_COMMUNITY): Payer: Medicare Other

## 2018-02-16 ENCOUNTER — Inpatient Hospital Stay (HOSPITAL_COMMUNITY)
Admission: EM | Admit: 2018-02-16 | Discharge: 2018-02-19 | DRG: 871 | Disposition: A | Discharge status: Home / Self Care | Payer: Medicare Other | Attending: Family Medicine | Admitting: Family Medicine

## 2018-02-16 DIAGNOSIS — A419 Sepsis, unspecified organism: Principal | ICD-10-CM | POA: Diagnosis present

## 2018-02-16 DIAGNOSIS — J440 Chronic obstructive pulmonary disease with acute lower respiratory infection: Secondary | ICD-10-CM | POA: Diagnosis present

## 2018-02-16 DIAGNOSIS — Z7901 Long term (current) use of anticoagulants: Secondary | ICD-10-CM

## 2018-02-16 DIAGNOSIS — N179 Acute kidney failure, unspecified: Secondary | ICD-10-CM | POA: Diagnosis present

## 2018-02-16 DIAGNOSIS — Z952 Presence of prosthetic heart valve: Secondary | ICD-10-CM

## 2018-02-16 DIAGNOSIS — I350 Nonrheumatic aortic (valve) stenosis: Secondary | ICD-10-CM | POA: Diagnosis not present

## 2018-02-16 DIAGNOSIS — Z951 Presence of aortocoronary bypass graft: Secondary | ICD-10-CM | POA: Diagnosis not present

## 2018-02-16 DIAGNOSIS — J189 Pneumonia, unspecified organism: Secondary | ICD-10-CM | POA: Diagnosis present

## 2018-02-16 DIAGNOSIS — I5032 Chronic diastolic (congestive) heart failure: Secondary | ICD-10-CM | POA: Diagnosis present

## 2018-02-16 DIAGNOSIS — E1122 Type 2 diabetes mellitus with diabetic chronic kidney disease: Secondary | ICD-10-CM | POA: Diagnosis present

## 2018-02-16 DIAGNOSIS — Y95 Nosocomial condition: Secondary | ICD-10-CM | POA: Diagnosis present

## 2018-02-16 DIAGNOSIS — I2510 Atherosclerotic heart disease of native coronary artery without angina pectoris: Secondary | ICD-10-CM | POA: Diagnosis present

## 2018-02-16 DIAGNOSIS — Z794 Long term (current) use of insulin: Secondary | ICD-10-CM

## 2018-02-16 DIAGNOSIS — Z6830 Body mass index (BMI) 30.0-30.9, adult: Secondary | ICD-10-CM | POA: Diagnosis not present

## 2018-02-16 DIAGNOSIS — I480 Paroxysmal atrial fibrillation: Secondary | ICD-10-CM | POA: Diagnosis present

## 2018-02-16 DIAGNOSIS — J449 Chronic obstructive pulmonary disease, unspecified: Secondary | ICD-10-CM | POA: Diagnosis present

## 2018-02-16 DIAGNOSIS — E1151 Type 2 diabetes mellitus with diabetic peripheral angiopathy without gangrene: Secondary | ICD-10-CM | POA: Diagnosis present

## 2018-02-16 DIAGNOSIS — E1165 Type 2 diabetes mellitus with hyperglycemia: Secondary | ICD-10-CM | POA: Diagnosis present

## 2018-02-16 DIAGNOSIS — Z7902 Long term (current) use of antithrombotics/antiplatelets: Secondary | ICD-10-CM

## 2018-02-16 DIAGNOSIS — R3129 Other microscopic hematuria: Secondary | ICD-10-CM | POA: Diagnosis present

## 2018-02-16 DIAGNOSIS — E119 Type 2 diabetes mellitus without complications: Secondary | ICD-10-CM

## 2018-02-16 DIAGNOSIS — Z7982 Long term (current) use of aspirin: Secondary | ICD-10-CM

## 2018-02-16 DIAGNOSIS — E669 Obesity, unspecified: Secondary | ICD-10-CM | POA: Diagnosis present

## 2018-02-16 DIAGNOSIS — I130 Hypertensive heart and chronic kidney disease with heart failure and stage 1 through stage 4 chronic kidney disease, or unspecified chronic kidney disease: Secondary | ICD-10-CM | POA: Diagnosis present

## 2018-02-16 DIAGNOSIS — Z87891 Personal history of nicotine dependence: Secondary | ICD-10-CM

## 2018-02-16 DIAGNOSIS — J9601 Acute respiratory failure with hypoxia: Secondary | ICD-10-CM | POA: Diagnosis present

## 2018-02-16 DIAGNOSIS — I4510 Unspecified right bundle-branch block: Secondary | ICD-10-CM | POA: Diagnosis present

## 2018-02-16 DIAGNOSIS — E785 Hyperlipidemia, unspecified: Secondary | ICD-10-CM | POA: Diagnosis present

## 2018-02-16 DIAGNOSIS — J9600 Acute respiratory failure, unspecified whether with hypoxia or hypercapnia: Secondary | ICD-10-CM | POA: Diagnosis not present

## 2018-02-16 DIAGNOSIS — I509 Heart failure, unspecified: Secondary | ICD-10-CM

## 2018-02-16 DIAGNOSIS — N184 Chronic kidney disease, stage 4 (severe): Secondary | ICD-10-CM | POA: Diagnosis present

## 2018-02-16 DIAGNOSIS — Z79899 Other long term (current) drug therapy: Secondary | ICD-10-CM | POA: Diagnosis not present

## 2018-02-16 DIAGNOSIS — E11649 Type 2 diabetes mellitus with hypoglycemia without coma: Secondary | ICD-10-CM | POA: Diagnosis not present

## 2018-02-16 LAB — URINALYSIS, ROUTINE W REFLEX MICROSCOPIC
BACTERIA UA: NONE SEEN
Bilirubin Urine: NEGATIVE
Glucose, UA: >=500 mg/dL — AB
Ketones, ur: NEGATIVE mg/dL
Leukocytes, UA: NEGATIVE
Nitrite: NEGATIVE
PH: 5 (ref 5.0–8.0)
Protein, ur: 100 mg/dL — AB
Specific Gravity, Urine: 1.015 (ref 1.005–1.030)

## 2018-02-16 LAB — BASIC METABOLIC PANEL
Anion gap: 15 (ref 5–15)
BUN: 55 mg/dL — AB (ref 6–20)
CHLORIDE: 98 mmol/L — AB (ref 101–111)
CO2: 21 mmol/L — ABNORMAL LOW (ref 22–32)
CREATININE: 3.19 mg/dL — AB (ref 0.61–1.24)
Calcium: 8.6 mg/dL — ABNORMAL LOW (ref 8.9–10.3)
GFR calc Af Amer: 20 mL/min — ABNORMAL LOW (ref >60)
GFR calc non Af Amer: 17 mL/min — ABNORMAL LOW (ref >60)
GLUCOSE: 391 mg/dL — AB (ref 65–99)
POTASSIUM: 4.4 mmol/L (ref 3.5–5.1)
Sodium: 134 mmol/L — ABNORMAL LOW (ref 135–145)

## 2018-02-16 LAB — CBC
HEMATOCRIT: 39.6 % (ref 39.0–52.0)
Hemoglobin: 13 g/dL (ref 13.0–17.0)
MCH: 26.7 pg (ref 26.0–34.0)
MCHC: 32.8 g/dL (ref 30.0–36.0)
MCV: 81.5 fL (ref 78.0–100.0)
PLATELETS: 170 10*3/uL (ref 150–400)
RBC: 4.86 MIL/uL (ref 4.22–5.81)
RDW: 16.1 % — ABNORMAL HIGH (ref 11.5–15.5)
WBC: 12.8 10*3/uL — ABNORMAL HIGH (ref 4.0–10.5)

## 2018-02-16 LAB — PROCALCITONIN: PROCALCITONIN: 0.58 ng/mL

## 2018-02-16 LAB — GLUCOSE, CAPILLARY: GLUCOSE-CAPILLARY: 358 mg/dL — AB (ref 65–99)

## 2018-02-16 LAB — INFLUENZA PANEL BY PCR (TYPE A & B)
INFLAPCR: NEGATIVE
INFLBPCR: NEGATIVE

## 2018-02-16 LAB — LACTIC ACID, PLASMA: LACTIC ACID, VENOUS: 1.8 mmol/L (ref 0.5–1.9)

## 2018-02-16 MED ORDER — HYDROCODONE-ACETAMINOPHEN 5-325 MG PO TABS: 1.0000 | ORAL_TABLET | ORAL | Status: DC | PRN

## 2018-02-16 MED ORDER — ISOSORBIDE MONONITRATE ER 60 MG PO TB24
60.0000 mg | ORAL_TABLET | Freq: Every day | ORAL | Status: DC
  Administered 2018-02-17 – 2018-02-19 (×3): 60 mg via ORAL
  Filled 2018-02-16 (×3): qty 1

## 2018-02-16 MED ORDER — CITALOPRAM HYDROBROMIDE 10 MG PO TABS
10.0000 mg | ORAL_TABLET | Freq: Every day | ORAL | Status: DC
  Administered 2018-02-17 – 2018-02-19 (×3): 10 mg via ORAL
  Filled 2018-02-16 (×3): qty 1

## 2018-02-16 MED ORDER — RIVAROXABAN 15 MG PO TABS
15.0000 mg | ORAL_TABLET | Freq: Every day | ORAL | Status: DC
  Administered 2018-02-16 – 2018-02-18 (×3): 15 mg via ORAL
  Filled 2018-02-16 (×4): qty 1

## 2018-02-16 MED ORDER — LACTATED RINGERS IV BOLUS
1000.0000 mL | Freq: Once | INTRAVENOUS | Status: AC
  Administered 2018-02-16: 1000 mL via INTRAVENOUS

## 2018-02-16 MED ORDER — VANCOMYCIN HCL IN DEXTROSE 1-5 GM/200ML-% IV SOLN: 1000.0000 mg | INTRAVENOUS | Status: DC

## 2018-02-16 MED ORDER — FUROSEMIDE 40 MG PO TABS
40.0000 mg | ORAL_TABLET | Freq: Two times a day (BID) | ORAL | Status: DC
  Administered 2018-02-16 – 2018-02-18 (×4): 40 mg via ORAL
  Filled 2018-02-16 (×4): qty 1

## 2018-02-16 MED ORDER — CLOPIDOGREL BISULFATE 75 MG PO TABS
75.0000 mg | ORAL_TABLET | Freq: Every day | ORAL | Status: DC
Start: 2018-02-16 — End: 2018-02-19
  Administered 2018-02-17 – 2018-02-19 (×3): 75 mg via ORAL
  Filled 2018-02-16 (×3): qty 1

## 2018-02-16 MED ORDER — INSULIN GLARGINE 100 UNIT/ML ~~LOC~~ SOLN
40.0000 [IU] | Freq: Every day | SUBCUTANEOUS | Status: DC
  Administered 2018-02-17: 40 [IU] via SUBCUTANEOUS
  Filled 2018-02-16 (×3): qty 0.4

## 2018-02-16 MED ORDER — ASPIRIN EC 81 MG PO TBEC: 81.0000 mg | DELAYED_RELEASE_TABLET | Freq: Every day | ORAL | Status: DC

## 2018-02-16 MED ORDER — ACETAMINOPHEN 325 MG PO TABS: 650.0000 mg | ORAL_TABLET | Freq: Four times a day (QID) | ORAL | Status: DC | PRN

## 2018-02-16 MED ORDER — INSULIN ASPART 100 UNIT/ML ~~LOC~~ SOLN
0.0000 [IU] | Freq: Three times a day (TID) | SUBCUTANEOUS | Status: DC
  Administered 2018-02-17: 7 [IU] via SUBCUTANEOUS
  Administered 2018-02-17: 3 [IU] via SUBCUTANEOUS
  Administered 2018-02-18 (×2): 4 [IU] via SUBCUTANEOUS
  Administered 2018-02-18: 7 [IU] via SUBCUTANEOUS
  Administered 2018-02-19: 4 [IU] via SUBCUTANEOUS

## 2018-02-16 MED ORDER — VANCOMYCIN HCL IN DEXTROSE 1-5 GM/200ML-% IV SOLN
1000.0000 mg | Freq: Once | INTRAVENOUS | Status: AC
Start: 2018-02-16 — End: 2018-02-16
  Administered 2018-02-16: 1000 mg via INTRAVENOUS
  Filled 2018-02-16: qty 200

## 2018-02-16 MED ORDER — ACETAMINOPHEN 650 MG RE SUPP: 650.0000 mg | Freq: Four times a day (QID) | RECTAL | Status: DC | PRN

## 2018-02-16 MED ORDER — METOPROLOL TARTRATE 5 MG/5ML IV SOLN: 5.0000 mg | INTRAVENOUS | Status: DC | PRN

## 2018-02-16 MED ORDER — SODIUM CHLORIDE 0.9 % IV SOLN
2.0000 g | Freq: Once | INTRAVENOUS | Status: AC
  Administered 2018-02-16: 2 g via INTRAVENOUS
  Filled 2018-02-16: qty 2

## 2018-02-16 MED ORDER — INSULIN ASPART 100 UNIT/ML ~~LOC~~ SOLN
0.0000 [IU] | Freq: Every day | SUBCUTANEOUS | Status: DC
  Administered 2018-02-16: 5 [IU] via SUBCUTANEOUS

## 2018-02-16 MED ORDER — TRAZODONE HCL 50 MG PO TABS
25.0000 mg | ORAL_TABLET | Freq: Every evening | ORAL | Status: DC | PRN
  Filled 2018-02-16: qty 1

## 2018-02-16 MED ORDER — AMLODIPINE BESYLATE 5 MG PO TABS
10.0000 mg | ORAL_TABLET | Freq: Every day | ORAL | Status: DC
  Administered 2018-02-17 – 2018-02-19 (×3): 10 mg via ORAL
  Filled 2018-02-16 (×3): qty 2

## 2018-02-16 MED ORDER — DOCUSATE SODIUM 100 MG PO CAPS
100.0000 mg | ORAL_CAPSULE | Freq: Two times a day (BID) | ORAL | Status: DC
  Administered 2018-02-18 – 2018-02-19 (×2): 100 mg via ORAL
  Filled 2018-02-16 (×2): qty 1

## 2018-02-16 MED ORDER — INSULIN ASPART 100 UNIT/ML ~~LOC~~ SOLN
20.0000 [IU] | Freq: Three times a day (TID) | SUBCUTANEOUS | Status: DC
  Administered 2018-02-17 – 2018-02-18 (×4): 20 [IU] via SUBCUTANEOUS

## 2018-02-16 MED ORDER — METOPROLOL SUCCINATE ER 50 MG PO TB24
50.0000 mg | ORAL_TABLET | Freq: Every day | ORAL | Status: DC
  Administered 2018-02-17 – 2018-02-19 (×3): 50 mg via ORAL
  Filled 2018-02-16 (×4): qty 1

## 2018-02-16 MED ORDER — ONDANSETRON HCL 4 MG PO TABS: 4.0000 mg | ORAL_TABLET | Freq: Four times a day (QID) | ORAL | Status: DC | PRN

## 2018-02-16 MED ORDER — INSULIN ASPART 100 UNIT/ML ~~LOC~~ SOLN: 15.0000 [IU] | SUBCUTANEOUS | Status: AC

## 2018-02-16 MED ORDER — SODIUM CHLORIDE 0.9 % IV SOLN
1.0000 g | INTRAVENOUS | Status: DC
  Administered 2018-02-17 – 2018-02-18 (×2): 1 g via INTRAVENOUS
  Filled 2018-02-16 (×2): qty 1

## 2018-02-16 MED ORDER — ROSUVASTATIN CALCIUM 40 MG PO TABS
40.0000 mg | ORAL_TABLET | Freq: Every day | ORAL | Status: DC
  Administered 2018-02-16 – 2018-02-18 (×3): 40 mg via ORAL
  Filled 2018-02-16 (×4): qty 1

## 2018-02-16 MED ORDER — INSULIN ASPART 100 UNIT/ML ~~LOC~~ SOLN: 15.0000 [IU] | Freq: Once | SUBCUTANEOUS | Status: DC

## 2018-02-16 MED ORDER — ERYTHROMYCIN 5 MG/GM OP OINT
TOPICAL_OINTMENT | Freq: Three times a day (TID) | OPHTHALMIC | Status: DC
  Administered 2018-02-16: 22:00:00 via OPHTHALMIC
  Administered 2018-02-17: 1 via OPHTHALMIC
  Administered 2018-02-17 – 2018-02-18 (×3): via OPHTHALMIC
  Administered 2018-02-18: 1 via OPHTHALMIC
  Administered 2018-02-18 – 2018-02-19 (×2): via OPHTHALMIC
  Filled 2018-02-16: qty 3.5

## 2018-02-16 MED ORDER — INSULIN ASPART 100 UNIT/ML ~~LOC~~ SOLN: 0.0000 [IU] | Freq: Every day | SUBCUTANEOUS | Status: DC

## 2018-02-16 MED ORDER — INSULIN ASPART 100 UNIT/ML ~~LOC~~ SOLN: 0.0000 [IU] | Freq: Three times a day (TID) | SUBCUTANEOUS | Status: DC

## 2018-02-16 MED ORDER — SODIUM CHLORIDE 0.9 % IV SOLN
INTRAVENOUS | Status: AC
  Administered 2018-02-16: 23:00:00 via INTRAVENOUS

## 2018-02-16 MED ORDER — ACETAMINOPHEN 325 MG PO TABS
650.0000 mg | ORAL_TABLET | Freq: Once | ORAL | Status: AC
  Administered 2018-02-16: 650 mg via ORAL
  Filled 2018-02-16: qty 2

## 2018-02-16 MED ORDER — ALBUTEROL SULFATE (2.5 MG/3ML) 0.083% IN NEBU
2.5000 mg | INHALATION_SOLUTION | Freq: Four times a day (QID) | RESPIRATORY_TRACT | Status: DC
  Administered 2018-02-16 – 2018-02-17 (×4): 2.5 mg via RESPIRATORY_TRACT
  Filled 2018-02-16 (×5): qty 3

## 2018-02-16 MED ORDER — ONDANSETRON HCL 4 MG/2ML IJ SOLN: 4.0000 mg | Freq: Four times a day (QID) | INTRAMUSCULAR | Status: DC | PRN

## 2018-02-16 NOTE — ED Triage Notes (Signed)
Patient presents to ED for assessment of 3 days of SOB, with increasing weakness, cough, green/yellow sputum.  Patient denies fevers.  Denies chest pain.

## 2018-02-16 NOTE — ED Provider Notes (Signed)
[facility] EMERGENCY DEPARTMENT Provider Note   CSN: [id number] Arrival date & time: 02/16/18  1402     History   Chief Complaint Chief Complaint  Patient presents with  . Weakness    HPI Cameron [name] is a 77 y.o. male.  HPI  77 year old male with a history of coronary disease, CKD, diabetes, paroxysmal A. fib and recent aortic valve replacement presents with shortness of breath and productive cough.  He always carries a cough but is much worse with brown sputum over the last 3 days.  He has been short of breath.  He has had diffuse weakness and decreased p.o. intake.  No fevers but has had chills.  He denies chest pain, abdominal pain, or leg swelling.  No urinary symptoms.  Past Medical History:  Diagnosis Date  . CAD (coronary artery disease)    a. 2002: s/p CABG x 4V (LIMA-LAD, seq SVG-PDA, PLA, seq SVG-OM1, OM2, SVG-Diag1)  . CKD (chronic kidney disease)   . COPD (chronic obstructive pulmonary disease) ([place])   . Diabetes mellitus without complication ([place])   . HLD (hyperlipidemia)   . Hypertension   . Obesity   . PAF (paroxysmal atrial fibrillation) ([place])   . RBBB   . 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 Severe aortic stenosis     Patient Active Problem List   Diagnosis Date Noted  . Status post percutaneous transluminal angioplasty (PTA) 01/28/2018  . Severe claudication ([place]) 01/27/2018  . Abnormal ankle brachial index (ABI) 01/27/2018  . Diabetes ([place]) 01/25/2018  . Acute on chronic diastolic heart failure ([place]) 12/03/2017  . Severe aortic stenosis 12/03/2017  . CKD (chronic kidney disease) stage 4, GFR 15-29 ml/min ([facility]) 12/03/2017  . 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 Obesity   . COPD (chronic obstructive pulmonary disease) ([place])   . CKD (chronic kidney disease)   . CAD (coronary artery disease)   .  PAF (paroxysmal atrial fibrillation) ([place])   . RBBB     Past Surgical History:  Procedure Laterality Date  . ABDOMINAL AORTAGRAM Right 01/28/2018   LOWER EXTREMITY  . ABDOMINAL AORTOGRAM W/LOWER EXTREMITY N/A 01/28/2018   Procedure: ABDOMINAL AORTOGRAM W/LOWER EXTREMITY Run[place];  Surgeon: Nigel Mormon, MD;  Location: [facility];  Service: Cardiovascular;  Laterality: N/A;  . CARDIAC SURGERY    . CORONARY BALLOON ANGIOPLASTY N/A 09/23/2017   Procedure: CORONARY BALLOON ANGIOPLASTY;  Surgeon: Nigel Mormon, MD;  Location: [facility];  Service: Cardiovascular;  Laterality: N/A;  . FINGER AMPUTATION    . HERNIA REPAIR    . open heart surgery    . PERIPHERAL VASCULAR BALLOON ANGIOPLASTY  01/28/2018   Procedure: PERIPHERAL VASCULAR BALLOON ANGIOPLASTY;  Surgeon: Nigel Mormon, MD;  Location: [facility];  Service: Cardiovascular;;  Rt SFA  . RIGHT HEART CATH AND CORONARY/GRAFT ANGIOGRAPHY N/A 09/17/2017   Procedure: RIGHT HEART CATH AND CORONARY/GRAFT ANGIOGRAPHY;  Surgeon: Nigel Mormon, MD;  Location: [facility];  Service: Cardiovascular;  Laterality: N/A;  . TEE WITHOUT CARDIOVERSION N/A 12/03/2017   Procedure: TRANSESOPHAGEAL ECHOCARDIOGRAM (TEE);  Surgeon: Sherren Mocha, MD;  Location: [facility];  Service: Open Heart Surgery;  Laterality: N/A;  . TOE AMPUTATION    . TRANSCATHETER AORTIC VALVE REPLACEMENT, TRANSFEMORAL N/A 12/03/2017   Procedure: TRANSCATHETER AORTIC VALVE REPLACEMENT, TRANSFEMORAL;  Surgeon: Sherren Mocha, MD;  Location:  [facility];  Service: Open Heart Surgery;  Laterality: N/A;  . ULTRASOUND GUIDANCE FOR VASCULAR ACCESS  09/17/2017   Procedure: Ultrasound Guidance For Vascular Access;  Surgeon: Nigel Mormon, MD;  Location: [facility];  Service: Cardiovascular;;        Home Medications    Prior to Admission medications   Medication Sig Start Date End Date Taking? Authorizing Provider  amLODipine  (NORVASC) 10 MG tablet Take 10 mg by mouth daily.   Yes [provider]  ammonium lactate (AMLACTIN) 12 % lotion Apply 1 application topically as needed for dry skin. Recommend twice daily until dry skin improved 01/21/18  Yes Stover, Titorya, DPM  clopidogrel (PLAVIX) 75 MG tablet Take 1 tablet (75 mg total) by mouth daily. 01/29/18  Yes Patwardhan, Manish J, MD  furosemide (LASIX) 40 MG tablet Take 1 tablet (40 mg total) by mouth 2 (two) times daily. 12/27/17 12/22/18 Yes Sherren Mocha, MD  insulin NPH Human (HUMULIN N,NOVOLIN N) 100 UNIT/ML injection Inject 0.5 mLs (50 Units total) into the skin at bedtime. 01/23/18  Yes Renato Shin, MD  isosorbide mononitrate (IMDUR) 60 MG 24 hr tablet Take 60 mg by mouth daily.   Yes [provider]  metoprolol succinate (TOPROL-XL) 50 MG 24 hr tablet Take 50 mg by mouth daily. Take with or immediately following a meal.   Yes [provider]  nitroGLYCERIN (NITROSTAT) 0.4 MG SL tablet Place 0.4 mg every 5 (five) minutes as needed under the tongue for chest pain.   Yes [provider]  Rivaroxaban (XARELTO) 15 MG TABS tablet Take 1 tablet (15 mg total) by mouth daily with supper. 09/25/17  Yes Patwardhan, Manish J, MD  rosuvastatin (CRESTOR) 40 MG tablet Take 40 mg daily after supper by mouth.    Yes [provider]  amLODipine (NORVASC) 5 MG tablet Take 1 tablet (5 mg total) by mouth daily. Patient not taking: Reported on 02/16/2018 09/25/17   Nigel Mormon, MD  amoxicillin (AMOXIL) 500 MG capsule Take 4 tablets (2g total) one hour prior to dental appointment Patient taking differently: Take 2,000 mg by mouth See admin instructions. Take 4 tablets (2g total) one hour prior to dental appointment 12/12/17   Eileen Stanford, PA-C  aspirin EC 81 MG EC tablet Take 1 tablet (81 mg total) by mouth daily. 12/06/17   Eileen Stanford, PA-C  citalopram (CELEXA) 10 MG tablet Take 1 tablet (10 mg total) by mouth daily.  09/25/17   Patwardhan, Manish J, MD  glucose blood (ONETOUCH VERIO) test strip 1 each by Other route 2 (two) times daily. And lancets 2/day 01/23/18   Renato Shin, MD  insulin regular (NOVOLIN R,HUMULIN R) 100 units/mL injection 3 times a day (just before each meal), 50-30-50 units, and syringes 4/day 01/23/18   Renato Shin, MD  metoprolol succinate (TOPROL-XL) 100 MG 24 hr tablet Take 1 tablet (100 mg total) by mouth daily after supper. Take with or immediately following a meal. Patient not taking: Reported on 02/16/2018 09/25/17   Nigel Mormon, MD    Family History Family History  Problem Relation Age of Onset  . Diabetes Mother   . Heart disease Father   . Stomach cancer Paternal Grandfather   . Lung cancer Maternal Grandmother        smoked    Social History Social History   Tobacco Use  . Smoking status: Former Smoker    Packs/day: 2.00    Years: 20.00  Pack years: 40.00    Types: Cigarettes    Last attempt to quit: 09/28/1978    Years since quitting: 39.4  . Smokeless tobacco: Never Used  Substance Use Topics  . Alcohol use: No  . Drug use: No     Allergies   Cefuroxime and Ezetimibe   Review of Systems Review of Systems  Constitutional: Positive for chills. Negative for fever.  Eyes: Positive for discharge (right eye x 2 weeks).  Respiratory: Positive for cough and shortness of breath.   Gastrointestinal: Positive for nausea. Negative for abdominal pain and vomiting.  Neurological: Positive for weakness.  All other systems reviewed and are negative.    Physical Exam Updated Vital Signs BP 110/67 (BP Location: Left Arm)   Pulse (!) 116   Temp (!) 101.5 F (38.6 C) (Rectal)   Resp (!) 22   SpO2 94%   Physical Exam  Constitutional: He is oriented to person, place, and time. He appears well-developed and well-nourished.  Obese Coughing, occasionally productive of dark sputum  HENT:  Head: Normocephalic and atraumatic.  Right Ear: External  ear normal.  Left Ear: External ear normal.  Nose: Nose normal.  Eyes: Right eye exhibits no discharge. Left eye exhibits no discharge.  Neck: Neck supple.  Cardiovascular: Regular rhythm and normal heart sounds. Tachycardia present.  Pulmonary/Chest: No accessory muscle usage. Tachypnea noted. He has decreased breath sounds in the right lower field and the left lower field. He has rales in the right lower field.  Abdominal: Soft. There is no tenderness.  Musculoskeletal: He exhibits no edema.  Neurological: He is alert and oriented to person, place, and time.  Skin: Skin is warm and dry. He is not diaphoretic.  Nursing note and vitals reviewed.    ED Treatments / Results  Labs (all labs ordered are listed, but only abnormal results are displayed) Labs Reviewed  BASIC METABOLIC PANEL - Abnormal; Notable for the following components:      Result Value   Sodium 134 (*)    Chloride 98 (*)    CO2 21 (*)    Glucose, Bld 391 (*)    BUN 55 (*)    Creatinine, Ser 3.19 (*)    Calcium 8.6 (*)    GFR calc non Af Amer 17 (*)    GFR calc Af Amer 20 (*)    All other components within normal limits  CBC - Abnormal; Notable for the following components:   WBC 12.8 (*)    RDW 16.1 (*)    All other components within normal limits  URINALYSIS, ROUTINE W REFLEX MICROSCOPIC - Abnormal; Notable for the following components:   APPearance HAZY (*)    Glucose, UA >=500 (*)    Hgb urine dipstick LARGE (*)    Protein, ur 100 (*)    Squamous Epithelial / LPF 0-5 (*)    All other components within normal limits  URINE CULTURE  CULTURE, BLOOD (ROUTINE X 2)  CULTURE, BLOOD (ROUTINE X 2)  LACTIC ACID, PLASMA  LACTIC ACID, PLASMA  INFLUENZA PANEL BY PCR (TYPE A & B)    EKG None  Radiology Dg Chest 2 View  Result Date: 02/16/2018 CLINICAL DATA:  Cough and shortness of breath for 3 days. EXAM: CHEST - 2 VIEW COMPARISON:  Single-view of the chest 12/23/2017. CT chest 11/20/2017. FINDINGS: The  patient is status post CABG and aortic valve repair. There is cardiomegaly without edema. Mild left basilar atelectasis noted. No pneumothorax or pleural effusion. Remote right  rib fractures are seen. IMPRESSION: Cardiomegaly without acute disease. Electronically Signed   By: Inge Rise M.D.   On: 02/16/2018 14:47    Procedures Procedures (including critical care time)  Angiocath insertion Performed by: Ephraim Hamburger  Consent: Verbal consent obtained. Risks and benefits: risks, benefits and alternatives were discussed Time out: Immediately prior to procedure a "time out" was called to verify the correct patient, procedure, equipment, support staff and site/side marked as required.  Preparation: Patient was prepped and draped in the usual sterile fashion.  Vein Location: right basilic  Ultrasound Guided  Gauge: 20  Normal blood return and flush without difficulty Patient tolerance: Patient tolerated the procedure well with no immediate complications.    Medications Ordered in ED Medications  acetaminophen (TYLENOL) tablet 650 mg (has no administration in time range)  ceFEPIme (MAXIPIME) 2 g in sodium chloride 0.9 % 100 mL IVPB (2 g Intravenous New Bag/Given 02/16/18 1735)  vancomycin (VANCOCIN) IVPB 1000 mg/200 mL premix (has no administration in time range)  lactated ringers bolus 1,000 mL (has no administration in time range)  vancomycin (VANCOCIN) IVPB 1000 mg/200 mL premix (has no administration in time range)  ceFEPIme (MAXIPIME) 1 g in sodium chloride 0.9 % 100 mL IVPB (has no administration in time range)     Initial Impression / Assessment and Plan / ED Course  I have reviewed the triage vital signs and the nursing notes.  Pertinent labs & imaging results that were available during my care of the patient were reviewed by me and considered in my medical decision making (see chart for details).     With his productive cough and rectal temperature of 101.5, this  is consistent with pneumonia.  Concern for sepsis given leukocytosis, tachycardia, fever.  Mildly hypoxic to about 90% but no lower.  He has a mild bump in his chronic elevated creatinine.  He was given a fluid bolus and will be started on vancomycin and cefepime.  No hypotension or lactic acidosis to suggest severe sepsis or septic shock.  While CT imaging would probably provide more clear picture of his lungs, I think the diagnosis of pneumonia is most likely do not think this would be necessary at this time.  Admit to the hospitalist service.  Final Clinical Impressions(s) / ED Diagnoses   Final diagnoses:  Acute respiratory failure with hypoxia ([place])  HCAP (healthcare-associated pneumonia)    ED Discharge Orders    None       Sherwood Gambler, MD 02/16/18 1746

## 2018-02-16 NOTE — Progress Notes (Signed)
Pharmacy Antibiotic Note  Cameron [name] is a 77 y.o. male admitted on 02/16/2018 with pneumonia.  Pharmacy has been consulted for Vancomycin / Cefepime dosing.  1 st doses ordered  Plan: Cefepime 1 gram iv Q 24 hours Vancomycin 1 gram iv Q 24 hours Follow up cultures, Scr, progress     Temp (24hrs), Avg:99.9 F (37.7 C), Min:98.9 F (37.2 C), Max:101.5 F (38.6 C)  Recent Labs  Lab 02/16/18 1422  WBC 12.8*  CREATININE 3.19*    Estimated Creatinine Clearance: 22.2 mL/min (A) (by C-G formula based on SCr of 3.19 mg/dL (H)).    Allergies  Allergen Reactions  . Cefuroxime Diarrhea  . Ezetimibe Diarrhea     Thank you for allowing pharmacy to be a part of this patient's care. Anette Guarneri, PharmD [phone] 02/16/2018 4:48 PM

## 2018-02-16 NOTE — H&P (Signed)
History and Physical    Cameron [name] VWU:[id number] DOB: Oct 17, 1941 DOA: 02/16/2018  PCP: Orpah Melter, MD Patient coming from: home  Chief Complaint: sob/fever/cough  HPI: Cameron [name] is a 77 y.o. male with medical history significant kidney disease stage IV, diabetes, paroxysmal A. Fib, obesity,CAD and recent aortic valve replacement presents to the emergency Department chief complaint shortness of breath, fever and productive cough. Initial evaluation reveals acute respiratory failure likely related to healthcare associated pneumonia. [facility] are asked to admit  Formation is obtained from the patient and the wife is at the bedside. Wife states that over the last 3 days patient has developed worsening shortness of breath productive cough with thick yellowish sputum. Associated symptoms include fever nausea with one episode of emesis one episode of diarrhea as well as generalized weakness and a decreased oral intake. Patient denies chest pain palpitation or worsening lower extremity edema.he denies headache dizziness syncope or near-syncope. He denies abdominal pain dysuria hematuria frequency or urgency.   ED Course: in the emergency department and ask temperature 101.5, he has tachypnea with increased oxygen demand, tachycardia. Lactic acid is within the limits of normal. He is provided with IV fluids and IV antibiotics per protocol.  Review of Systems: As per HPI otherwise all other systems reviewed and are negative.   Ambulatory Status: ambulates independently but his gait is unsteady no recent falls  Past Medical History:  Diagnosis Date  . CAD (coronary artery disease)    a. 2002: s/p CABG x 4V (LIMA-LAD, seq SVG-PDA, PLA, seq SVG-OM1, OM2, SVG-Diag1)  . CKD (chronic kidney disease)   . COPD (chronic obstructive pulmonary disease) ([place])   . Diabetes mellitus without complication ([place])   . HLD (hyperlipidemia)   . Hypertension   . Obesity   . PAF (paroxysmal  atrial fibrillation) ([place])   . RBBB   . 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 Severe aortic stenosis     Past Surgical History:  Procedure Laterality Date  . ABDOMINAL AORTAGRAM Right 01/28/2018   LOWER EXTREMITY  . ABDOMINAL AORTOGRAM W/LOWER EXTREMITY N/A 01/28/2018   Procedure: ABDOMINAL AORTOGRAM W/LOWER EXTREMITY [place];  Surgeon: Nigel Mormon, MD;  Location: [facility];  Service: Cardiovascular;  Laterality: N/A;  . CARDIAC SURGERY    . CORONARY BALLOON ANGIOPLASTY N/A 09/23/2017   Procedure: CORONARY BALLOON ANGIOPLASTY;  Surgeon: Nigel Mormon, MD;  Location: [facility];  Service: Cardiovascular;  Laterality: N/A;  . FINGER AMPUTATION    . HERNIA REPAIR    . open heart surgery    . PERIPHERAL VASCULAR BALLOON ANGIOPLASTY  01/28/2018   Procedure: PERIPHERAL VASCULAR BALLOON ANGIOPLASTY;  Surgeon: Nigel Mormon, MD;  Location: [facility];  Service: Cardiovascular;;  Rt SFA  . RIGHT HEART CATH AND CORONARY/GRAFT ANGIOGRAPHY N/A 09/17/2017   Procedure: RIGHT HEART CATH AND CORONARY/GRAFT ANGIOGRAPHY;  Surgeon: Nigel Mormon, MD;  Location: [facility];  Service: Cardiovascular;  Laterality: N/A;  . TEE WITHOUT CARDIOVERSION N/A 12/03/2017   Procedure: TRANSESOPHAGEAL ECHOCARDIOGRAM (TEE);  Surgeon: Sherren Mocha, MD;  Location: [facility];  Service: Open Heart Surgery;  Laterality: N/A;  . TOE AMPUTATION    . TRANSCATHETER AORTIC VALVE REPLACEMENT, TRANSFEMORAL N/A 12/03/2017   Procedure: TRANSCATHETER AORTIC VALVE REPLACEMENT, TRANSFEMORAL;  Surgeon: Sherren Mocha, MD;  Location: [facility];  Service: Open Heart Surgery;  Laterality: N/A;  .  ULTRASOUND GUIDANCE FOR VASCULAR ACCESS  09/17/2017   Procedure: Ultrasound Guidance For Vascular Access;  Surgeon: Nigel Mormon, MD;  Location: [facility];  Service:  Cardiovascular;;    Social History   Socioeconomic History  . Marital status: Married    Spouse name: Not on file  . Number of children: 2  . Years of education: Not on file  . Highest education level: Not on file  Occupational History  . Occupation: Doctor, general practice  Social Needs  . Financial resource strain: Not on file  . Food insecurity:    Worry: Not on file    Inability: Not on file  . Transportation needs:    Medical: Not on file    Non-medical: Not on file  Tobacco Use  . Smoking status: Former Smoker    Packs/day: 2.00    Years: 20.00    Pack years: 40.00    Types: Cigarettes    Last attempt to quit: 09/28/1978    Years since quitting: 39.4  . Smokeless tobacco: Never Used  Substance and Sexual Activity  . Alcohol use: No  . Drug use: No  . Sexual activity: Not on file  Lifestyle  . Physical activity:    Days per week: Not on file    Minutes per session: Not on file  . Stress: Not on file  Relationships  . Social connections:    Talks on phone: Not on file    Gets together: Not on file    Attends religious service: Not on file    Active member of club or organization: Not on file    Attends meetings of clubs or organizations: Not on file    Relationship status: Not on file  . Intimate partner violence:    Fear of current or ex partner: Not on file    Emotionally abused: Not on file    Physically abused: Not on file    Forced sexual activity: Not on file  Other Topics Concern  . Not on file  Social History Narrative  . Not on file    Allergies  Allergen Reactions  . Cefuroxime Diarrhea  . Ezetimibe Diarrhea    Family History  Problem Relation Age of Onset  . Diabetes Mother   . Heart disease Father   . Stomach cancer Paternal Grandfather   . Lung cancer Maternal Grandmother        smoked    Prior to Admission medications   Medication Sig Start Date End Date Taking? Authorizing Provider  amLODipine (NORVASC) 10 MG tablet Take  10 mg by mouth daily.   Yes [provider]  ammonium lactate (AMLACTIN) 12 % lotion Apply 1 application topically as needed for dry skin. Recommend twice daily until dry skin improved 01/21/18  Yes Stover, Titorya, DPM  clopidogrel (PLAVIX) 75 MG tablet Take 1 tablet (75 mg total) by mouth daily. 01/29/18  Yes Patwardhan, Manish J, MD  furosemide (LASIX) 40 MG tablet Take 1 tablet (40 mg total) by mouth 2 (two) times daily. 12/27/17 12/22/18 Yes Sherren Mocha, MD  insulin NPH Human (HUMULIN N,NOVOLIN N) 100 UNIT/ML injection Inject 0.5 mLs (50 Units total) into the skin at bedtime. 01/23/18  Yes Renato Shin, MD  isosorbide mononitrate (IMDUR) 60 MG 24 hr tablet Take 60 mg by mouth daily.   Yes [provider]  metoprolol succinate (TOPROL-XL) 50 MG 24 hr tablet Take 50 mg by mouth daily. Take with or immediately following a meal.   Yes  [provider]  nitroGLYCERIN (NITROSTAT) 0.4 MG SL tablet Place 0.4 mg every 5 (five) minutes as needed under the tongue for chest pain.   Yes [provider]  Rivaroxaban (XARELTO) 15 MG TABS tablet Take 1 tablet (15 mg total) by mouth daily with supper. 09/25/17  Yes Patwardhan, Manish J, MD  rosuvastatin (CRESTOR) 40 MG tablet Take 40 mg daily after supper by mouth.    Yes [provider]  amoxicillin (AMOXIL) 500 MG capsule Take 4 tablets (2g total) one hour prior to dental appointment Patient taking differently: Take 2,000 mg by mouth See admin instructions. Take 4 tablets (2g total) one hour prior to dental appointment 12/12/17   Eileen Stanford, PA-C  aspirin EC 81 MG EC tablet Take 1 tablet (81 mg total) by mouth daily. 12/06/17   Eileen Stanford, PA-C  citalopram (CELEXA) 10 MG tablet Take 1 tablet (10 mg total) by mouth daily. 09/25/17   Patwardhan, Manish J, MD  glucose blood (ONETOUCH VERIO) test strip 1 each by Other route 2 (two) times daily. And lancets 2/day 01/23/18   Renato Shin, MD  insulin regular  (NOVOLIN R,HUMULIN R) 100 units/mL injection 3 times a day (just before each meal), 50-30-50 units, and syringes 4/day 01/23/18   Renato Shin, MD    Physical Exam: Vitals:   02/16/18 1619 02/16/18 1745 02/16/18 1800 02/16/18 1815  BP:  (!) 144/66 (!) 146/79 (!) 152/56  Pulse:  (!) 106 (!) 110 (!) 103  Resp:  (!) 25 (!) 33 (!) 29  Temp: (!) 101.5 F (38.6 C)     TempSrc: Rectal     SpO2:  92% 92% 92%     General:  Appears slightly anxious sitting straight up in bed in mild to moderate distress. Eyes:  PERRL, EOMI, normal lids, iris ENT:  grossly normal hearing, lips & tongue, his membranes of his mouth are pink but dry Neck:  no LAD, masses or thyromegaly Cardiovascular:  Irregularly irregular, no m/r/g. 1+ lower extremity edema LE edema. Chronic venous stasis changes in lower extremities Respiratory:  Mild-to-moderate increased work of breathing initially with conversation. Has some difficulty completing a sentence. Frequent cough during conversation and exam. Thick yellowish sputum produced. Breath sounds are quite diminished. I hear fine crackles in bilateral bases Abdomen:  soft, ntnd, NABS Skin:  no rash or induration seen on limited exam Musculoskeletal:  grossly normal tone BUE/BLE, good ROM, no bony abnormality Psychiatric:  grossly normal mood and affect, speech fluent and appropriate, AOx3 Neurologic:  Speech clear facial symmetry alert and oriented  Labs on Admission: I have personally reviewed following labs and imaging studies  CBC: Recent Labs  Lab 02/16/18 1422  WBC 12.8*  HGB 13.0  HCT 39.6  MCV 81.5  PLT 329   Basic Metabolic Panel: Recent Labs  Lab 02/16/18 1422  NA 134*  K 4.4  CL 98*  CO2 21*  GLUCOSE 391*  BUN 55*  CREATININE 3.19*  CALCIUM 8.6*   GFR: Estimated Creatinine Clearance: 22.2 mL/min (A) (by C-G formula based on SCr of 3.19 mg/dL (H)). Liver Function Tests: No results for input(s): AST, ALT, ALKPHOS, BILITOT, PROT, ALBUMIN in  the last 168 hours. No results for input(s): LIPASE, AMYLASE in the last 168 hours. No results for input(s): AMMONIA in the last 168 hours. Coagulation Profile: No results for input(s): INR, PROTIME in the last 168 hours. Cardiac Enzymes: No results for input(s): CKTOTAL, CKMB, CKMBINDEX, TROPONINI in the last 168 hours. BNP (  last 3 results) No results for input(s): PROBNP in the last 8760 hours. HbA1C: No results for input(s): HGBA1C in the last 72 hours. CBG: No results for input(s): GLUCAP in the last 168 hours. Lipid Profile: No results for input(s): CHOL, HDL, LDLCALC, TRIG, CHOLHDL, LDLDIRECT in the last 72 hours. Thyroid Function Tests: No results for input(s): TSH, T4TOTAL, FREET4, T3FREE, THYROIDAB in the last 72 hours. Anemia Panel: No results for input(s): VITAMINB12, FOLATE, FERRITIN, TIBC, IRON, RETICCTPCT in the last 72 hours. Urine analysis:    Component Value Date/Time   COLORURINE YELLOW 02/16/2018 1450   APPEARANCEUR HAZY (A) 02/16/2018 1450   LABSPEC 1.015 02/16/2018 1450   PHURINE 5.0 02/16/2018 1450   GLUCOSEU >=500 (A) 02/16/2018 1450   HGBUR LARGE (A) 02/16/2018 1450   BILIRUBINUR NEGATIVE 02/16/2018 1450   KETONESUR NEGATIVE 02/16/2018 1450   PROTEINUR 100 (A) 02/16/2018 1450   UROBILINOGEN 1.0 02/22/2013 1318   NITRITE NEGATIVE 02/16/2018 1450   LEUKOCYTESUR NEGATIVE 02/16/2018 1450    Creatinine Clearance: Estimated Creatinine Clearance: 22.2 mL/min (A) (by C-G formula based on SCr of 3.19 mg/dL (H)).  Sepsis Labs: @LABRCNTIP (procalcitonin:4,lacticidven:4) )No results found for this or any previous visit (from the past 240 hour(s)).   Radiological Exams on Admission: Dg Chest 2 View  Result Date: 02/16/2018 CLINICAL DATA:  Cough and shortness of breath for 3 days. EXAM: CHEST - 2 VIEW COMPARISON:  Single-view of the chest 12/23/2017. CT chest 11/20/2017. FINDINGS: The patient is status post CABG and aortic valve repair. There is cardiomegaly  without edema. Mild left basilar atelectasis noted. No pneumothorax or pleural effusion. Remote right rib fractures are seen. IMPRESSION: Cardiomegaly without acute disease. Electronically Signed   By: Inge Rise M.D.   On: 02/16/2018 14:47    EKG: Atrial fibrillation with rapid ventricular response with premature ventricular or aberrantly conducted complexes Right bundle branch block Inferior infarct , age undetermined Assessment/Plan Principal Problem:   Acute respiratory failure ([place]) Active Problems:   Chronic diastolic heart failure ([facility])   Severe aortic stenosis   CKD (chronic kidney disease) stage 4, GFR 15-29 ml/min ([facility])   Obesity   COPD (chronic obstructive pulmonary disease) ([facility])   CAD (coronary artery disease)   PAF (paroxysmal atrial fibrillation) ([facility])   RBBB   Diabetes ([place])   HCAP (healthcare-associated pneumonia)   #1. Acute respiratory failure likely related to healthcare associated pneumonia and possibly acute on chronic diastolic heart failure.. Chest x-ray with cardiomegaly without acute disease. Patient with tachypnea and increased oxygen demand. Oxygen saturation level 90% on 2 L nasal cannula. He does not wear oxygen at baseline. He is febrile with a productive cough. At time of admission limb mild increased work of breathing oxygen saturation level 94% on 2 L -Continue oxygen supplementation -Monitor oxygen saturation level -IV antibiotics per pneumonia protocol -Nebulizers -track lactic acid -Wean oxygen as able  #2. Healthcare associated pneumonia. Patient with fever and productive cough tachypnea, tachycardia, hypoxia and leukocytosis. Sepsis protocol as she aged. He was provided with IV fluid bolus and vancomycin and cefepime. -Follow blood cultures -Sputum culture as able -trep pneumo urine antigen -Antibiotics as noted above -Monitor  #3.chronic diastolic heart failure. Patient with 1-2+ pitting edema lower extremities but he states that this is  much improved. Echo done in March 2019 revealed an ejection fraction of 60%, moderate LVH grade 1 diastolic dysfunction.home medications include Lasix 40 mg by mouth twice a day. Hear crackles on exam. Chest x-ray without vascular congestion. Some concern for  mild CHF exacerbation -Obtain daily weights -Monitor intake and output -continue home Lasix -Will have a low threshold for even a one dose IV Lasix  #4.CAD. Status post CABG 4 in 2002 and recent failed PCTA. No chest pain -EKG as noted above -Continue home meds  #5. Chronic kidney disease. It appears his baseline is close to 2.5. Today's creatinine is 3.1. This is above his baseline. - hold nephrotoxins -Urine output -Recheck in the morning  #6.paroxysmal A. Fib. Initially patient in A. Fib with RVR. At the time of admission he remains tachycardic at 106. Home meds include metoprolol and Imdur as well as aspirin Plavix and Xarelto.on exam he does have some bleeding in his gums and urinalysis with hematuria. Hemoglobin is stable. No signs symptoms of active bleeding -resume home medications -Monitor  #7. Severe AS. Status post valve replacement 3 weeks ago. Chart review indicates postoperative echo showed normal THB function. -Continue home meds  #8. Hypertension. Blood pressure on the high end of normal. -Continue home meds  Diabetes. Serum glucose 391 on admission. -Obtain hemoglobin A1c -15 units NovoLog now -sliding scale insulin for optimal control    DVT prophylaxis: eliquis  Code Status: full  Family Communication: wife at bedside  Disposition Plan: home  Consults called: none  Admission status: inpatient    Radene Gunning MD [facility]  If 7PM-7AM, please contact night-coverage [email or web address] Password [facility]  02/16/2018, 6:23 PM

## 2018-02-16 NOTE — ED Notes (Signed)
This RN also noted patient's gums/mouth are bleeding (slow), patient is on Xarelto and Plavix

## 2018-02-17 ENCOUNTER — Other Ambulatory Visit: Payer: Self-pay

## 2018-02-17 DIAGNOSIS — I350 Nonrheumatic aortic (valve) stenosis: Secondary | ICD-10-CM

## 2018-02-17 DIAGNOSIS — N184 Chronic kidney disease, stage 4 (severe): Secondary | ICD-10-CM

## 2018-02-17 DIAGNOSIS — E1122 Type 2 diabetes mellitus with diabetic chronic kidney disease: Secondary | ICD-10-CM

## 2018-02-17 DIAGNOSIS — J9601 Acute respiratory failure with hypoxia: Secondary | ICD-10-CM

## 2018-02-17 DIAGNOSIS — I4510 Unspecified right bundle-branch block: Secondary | ICD-10-CM

## 2018-02-17 DIAGNOSIS — I5032 Chronic diastolic (congestive) heart failure: Secondary | ICD-10-CM

## 2018-02-17 DIAGNOSIS — Z794 Long term (current) use of insulin: Secondary | ICD-10-CM

## 2018-02-17 DIAGNOSIS — A419 Sepsis, unspecified organism: Principal | ICD-10-CM

## 2018-02-17 DIAGNOSIS — J189 Pneumonia, unspecified organism: Secondary | ICD-10-CM

## 2018-02-17 DIAGNOSIS — I2510 Atherosclerotic heart disease of native coronary artery without angina pectoris: Secondary | ICD-10-CM

## 2018-02-17 DIAGNOSIS — I480 Paroxysmal atrial fibrillation: Secondary | ICD-10-CM

## 2018-02-17 LAB — EXPECTORATED SPUTUM ASSESSMENT W GRAM STAIN, RFLX TO RESP C

## 2018-02-17 LAB — CBC
HCT: 36.8 % — ABNORMAL LOW (ref 39.0–52.0)
HEMOGLOBIN: 12.4 g/dL — AB (ref 13.0–17.0)
MCH: 27.3 pg (ref 26.0–34.0)
MCHC: 33.7 g/dL (ref 30.0–36.0)
MCV: 81.1 fL (ref 78.0–100.0)
PLATELETS: 158 10*3/uL (ref 150–400)
RBC: 4.54 MIL/uL (ref 4.22–5.81)
RDW: 16.8 % — ABNORMAL HIGH (ref 11.5–15.5)
WBC: 9.2 10*3/uL (ref 4.0–10.5)

## 2018-02-17 LAB — GLUCOSE, CAPILLARY
GLUCOSE-CAPILLARY: 142 mg/dL — AB (ref 65–99)
GLUCOSE-CAPILLARY: 98 mg/dL (ref 65–99)
GLUCOSE-CAPILLARY: 60 mg/dL — AB (ref 65–99)
Glucose-Capillary: 237 mg/dL — ABNORMAL HIGH (ref 65–99)
Glucose-Capillary: 93 mg/dL (ref 65–99)

## 2018-02-17 LAB — BASIC METABOLIC PANEL
ANION GAP: 12 (ref 5–15)
BUN: 60 mg/dL — ABNORMAL HIGH (ref 6–20)
CALCIUM: 8.6 mg/dL — AB (ref 8.9–10.3)
CO2: 23 mmol/L (ref 22–32)
CREATININE: 3.35 mg/dL — AB (ref 0.61–1.24)
Chloride: 102 mmol/L (ref 101–111)
GFR calc Af Amer: 19 mL/min — ABNORMAL LOW (ref >60)
GFR calc non Af Amer: 16 mL/min — ABNORMAL LOW (ref >60)
Glucose, Bld: 267 mg/dL — ABNORMAL HIGH (ref 65–99)
Potassium: 3.8 mmol/L (ref 3.5–5.1)
SODIUM: 137 mmol/L (ref 135–145)

## 2018-02-17 LAB — PROCALCITONIN: Procalcitonin: 1.69 ng/mL

## 2018-02-17 LAB — EXPECTORATED SPUTUM ASSESSMENT W REFEX TO RESP CULTURE

## 2018-02-17 LAB — MRSA PCR SCREENING: MRSA BY PCR: NEGATIVE

## 2018-02-17 LAB — STREP PNEUMONIAE URINARY ANTIGEN: Strep Pneumo Urinary Antigen: NEGATIVE

## 2018-02-17 MED ORDER — ALBUTEROL SULFATE (2.5 MG/3ML) 0.083% IN NEBU
2.5000 mg | INHALATION_SOLUTION | Freq: Three times a day (TID) | RESPIRATORY_TRACT | Status: DC
  Filled 2018-02-17 (×3): qty 3

## 2018-02-17 MED ORDER — SACCHAROMYCES BOULARDII 250 MG PO CAPS
250.0000 mg | ORAL_CAPSULE | Freq: Two times a day (BID) | ORAL | Status: DC
  Administered 2018-02-17 – 2018-02-19 (×5): 250 mg via ORAL
  Filled 2018-02-17 (×5): qty 1

## 2018-02-17 MED ORDER — GUAIFENESIN-DM 100-10 MG/5ML PO SYRP
5.0000 mL | ORAL_SOLUTION | ORAL | Status: DC | PRN
  Administered 2018-02-17: 5 mL via ORAL
  Filled 2018-02-17: qty 5

## 2018-02-17 MED ORDER — INSULIN GLARGINE 100 UNIT/ML ~~LOC~~ SOLN
50.0000 [IU] | Freq: Every day | SUBCUTANEOUS | Status: DC
  Filled 2018-02-17 (×2): qty 0.5

## 2018-02-17 NOTE — Progress Notes (Signed)
Inpatient Diabetes Program Recommendations  AACE/ADA: New Consensus Statement on Inpatient Glycemic Control (2015)  Target Ranges:  Prepandial:   less than 140 mg/dL      Peak postprandial:   less than 180 mg/dL (1-2 hours)      Critically ill patients:  140 - 180 mg/dL   Lab Results  Component Value Date   GLUCAP 142 (H) 02/17/2018   HGBA1C 8.8 02/06/2018    Review of Glycemic ControlResults for KIMON, LOEWEN (MRN [id number]) as of 02/17/2018 13:09  Ref. Range 02/16/2018 22:05 02/17/2018 08:38 02/17/2018 11:58  Glucose-Capillary Latest Ref Range: 65 - 99 mg/dL 358 (H) 237 (H) 142 (H)   Diabetes history: Type 2 DM  Outpatient Diabetes medications: NPH 50 units q HS, Novolin R 50 units q AM, 30 units with lunch, and 50 units with supper Current orders for Inpatient glycemic control:  Novolog 20 units tid with meals, Lantus 40 units q HS, Novolog resistant tid with meals and HS  Inpatient Diabetes Program Recommendations:    Agree with current orders.  Will follow.   Thanks  Adah Perl, RN, BC-ADM Inpatient Diabetes Coordinator Pager [phone] (8a-5p)

## 2018-02-17 NOTE — Progress Notes (Signed)
PROGRESS NOTE  Cameron [name]  NUU:[id number] DOB: 1941/08/02 DOA: 02/16/2018 PCP: Orpah Melter, MD  Outpatient Specialists: Virgina Jock, cardiology; Eliison, endocrinology Brief Narrative: Cameron [name] is a 77 y.o. 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 He reported subjective chills, but didn't take a temperature at home. He denies worsening orthopnea, says his legs are "less swollen than they've ever been," and denies chest pain or palpitations. Had some blood in his mouth earlier but none coughed up, and none since. Denies gross hematuria.   ED evaluation included vitals with tachypnea, AFib w/RBBB on ECG, rate in 100-110's, and rectal temperature of 101.55F. Hypertensive and hypoxic. He had bibasilar crackles. WBC 12.8, lactic acid 1.8, hgb 13. Cr up from baseline at 3.19, hyperglycemia to 391mg /dl with glucosuria and microscopic hematuria without ketonuria. CXR showed cardiomegaly without infiltrate, though with such suggestive history, HCAP Tx was started empirically with vanc/cefepime. Blood cultures drawn.   Assessment & Plan: Principal Problem:   Acute respiratory failure (HCC) Active Problems:   Chronic diastolic heart failure (HCC)   Severe aortic stenosis   CKD (chronic kidney disease) stage 4, GFR 15-29 ml/min (HCC)   Obesity   COPD (chronic obstructive pulmonary disease) (HCC)   CAD (coronary artery disease)   PAF (paroxysmal atrial fibrillation) (HCC)   RBBB   Diabetes ([place])   HCAP (healthcare-associated pneumonia)   Sepsis ([place])  Sepsis due to HCAP: Presumed Dx based on very typical history despite negative infiltrate on CXR.  Flu negative, but may also be viral pneumonia.  - Vanc/cefepime started empirically, MRSA PCR neg, ok to DC vancomycin.  - Trend PCT. Leukocytosis resolved. - Follow blood cultures and sputum culture (suspect sputum Cx to be high yield).  - Repeat CXR in AM  Acute hypoxic respiratory failure:  - Supplemental oxygen prn - Treat w/abx  Chronic HFpEF: No pulmonary edema or vascular congestion on CXR. Below EDW (consistently >210lbs). Last echo s/p TAVR last month showed EF 60-65%, mod LVH w/G1DD, normal RV size/function, normally functioning AoV.  - Continue home lasix.   - Daily weights, I/O - Monitor renal function as below  CAD, PAD, PAF:  - Continue ASA, plavix, xarelto. Even w/some reported blood in mouth and microscopic hematuria, no anemia and bleeding isn't ongoing. Therefore risk of stopping medications outweighs benefit at this time.  - Continue metoprolol, give metoprolol IV prn HR sustained >100bpm (BP should sustain this)  IDT2DM: With significant insulin resistance based on home insulin requirements and last HbA1c of 8.8%. Unsure if he'll be eating the same diet as at home, so will modestly decrease home doses. Significantly hyperglycemic on admission without ketonuria.  - Continue 20u TIDWC + resistant SSI (takes 50-20-50 units TIDWC at home) and make changes as needed for CBG goal 180mg /dl. - Restart home 50u qHS, substitute lantus for NPH - Follow up with Dr. Loanne Drilling will be helpful in continued treatment of his most poorly controlled risk factor.   AKI on stage IV CKD: Possibly due to sepsis. Pt does not report drinking less than usual, and compliant w/lasix.  - Monitor, avoiding nephrotoxins (DC vanc) - If persistent, may need mod IVF.  -  Stop IVF today.   HTN: Continue home medications  DVT prophylaxis: Xarelto Code Status: Full Family Communication: None at bedside this AM Disposition Plan: Home when improved. PT eval ordered.   Consultants:    None  Procedures:   None  Antimicrobials:  Vancomycin 4/21  Cefepime 4/21 >>    Subjective: Breathing worse than baseline. No chest pain. Not significantly improved since admission. No new leg swelling. Orthopnea is chronic and stable. No fevers/chills.   Objective: Vitals:   02/17/18 0400 02/17/18 0604 02/17/18 0832 02/17/18 0950  BP:  (!) 145/72 125/86   Pulse:  77 96   Resp:  18 20   Temp:  98 F (36.7 C) 98 F (36.7 C)   TempSrc:  Oral Oral   SpO2:  95% 90% 95%  Weight: 94.1 kg (207 lb 8 oz)       Intake/Output Summary (Last 24 hours) at 02/17/2018 1255 Last data filed at 02/17/2018 0900 Gross per 24 hour  Intake 2150 ml  Output 801 ml  Net 1349 ml   Filed Weights   02/16/18 2134 02/17/18 0400  Weight: 94.7 kg (208 lb 12.8 oz) 94.1 kg (207 lb 8 oz)   Gen: Tired-appearing male in NAD HEENT: MMM, posterior oropharynx erythematous without exudates. No mucosal ulcerations or bleeding. Arcus senilis bilaterally with PERRL. Conjunctivae appear normal, no discharge.  Pulm: Mildly labored tachypnea on supplemental oxygen. Bibasilar crackles persist, without wheezing.  CV: Irreg irreg, rate improved, no murmur. Diminished DP and PT pulses. 1+ pitting LE edema.  GI: + BS; soft, obese, non-tender, non-distended Skin: LE's with stasis changes, decreased hair. R lower leg with pink discoloration without open wound, tenderness, induration or fluctuance.  Neuro: Alert and oriented. No focal neurological deficits. Psych: Judgement and insight appear normal. Mood & affect appropriate.   Data Reviewed: I have personally reviewed following labs and imaging studies  CBC: Recent Labs  Lab 02/16/18 1422 02/17/18 0655  WBC 12.8* 9.2  HGB 13.0 12.4*  HCT 39.6 36.8*  MCV 81.5 81.1  PLT 170 010   Basic Metabolic Panel: Recent Labs  Lab 02/16/18 1422 02/17/18 0655  NA 134* 137  K 4.4 3.8  CL 98* 102  CO2 21* 23  GLUCOSE 391* 267*  BUN 55* 60*  CREATININE 3.19*  3.35*  CALCIUM 8.6* 8.6*   GFR: Estimated Creatinine Clearance: 20.9 mL/min (A) (by C-G formula based on SCr of 3.35 mg/dL (H)). Liver Function Tests: No results for input(s): AST, ALT, ALKPHOS, BILITOT, PROT, ALBUMIN in the last 168 hours. No results for input(s): LIPASE, AMYLASE in the last 168 hours. No results for input(s): AMMONIA in the last 168 hours. Coagulation Profile: No results for input(s): INR, PROTIME in the last 168 hours. Cardiac Enzymes: No results for input(s): CKTOTAL, CKMB, CKMBINDEX, TROPONINI in the last 168 hours. BNP (last 3 results) No results for input(s): PROBNP in the last 8760 hours. HbA1C: No results for input(s): HGBA1C in the last 72 hours. CBG: Recent Labs  Lab 02/16/18 2205 02/17/18 0838 02/17/18 1158  GLUCAP 358* 237* 142*   Lipid Profile: No results for input(s): CHOL, HDL, LDLCALC, TRIG, CHOLHDL, LDLDIRECT in the last 72 hours. Thyroid Function Tests: No results for input(s): TSH, T4TOTAL, FREET4, T3FREE, THYROIDAB in the last 72 hours. Anemia Panel: No results for input(s): VITAMINB12, FOLATE, FERRITIN, TIBC, IRON, RETICCTPCT in the last 72 hours. Urine analysis:    Component Value Date/Time   COLORURINE YELLOW 02/16/2018 1450   APPEARANCEUR HAZY (A) 02/16/2018 1450  LABSPEC 1.015 02/16/2018 1450   PHURINE 5.0 02/16/2018 1450   GLUCOSEU >=500 (A) 02/16/2018 1450   HGBUR LARGE (A) 02/16/2018 1450   BILIRUBINUR NEGATIVE 02/16/2018 1450   KETONESUR NEGATIVE 02/16/2018 1450   PROTEINUR 100 (A) 02/16/2018 1450   UROBILINOGEN 1.0 02/22/2013 1318   NITRITE NEGATIVE 02/16/2018 1450   LEUKOCYTESUR NEGATIVE 02/16/2018 1450   Recent Results (from the past 240 hour(s))  MRSA PCR Screening     Status: None   Collection Time: 02/16/18 11:38 PM  Result Value Ref Range Status   MRSA by PCR NEGATIVE NEGATIVE Final    Comment:        The GeneXpert MRSA Assay (FDA approved for NASAL specimens only), is one component of a comprehensive MRSA  colonization surveillance program. It is not intended to diagnose MRSA infection nor to guide or monitor treatment for MRSA infections. Performed at [facility], [place] [address] [place], [place] [address]   Culture, sputum-assessment     Status: None   Collection Time: 02/17/18  3:55 AM  Result Value Ref Range Status   Specimen Description SPUTUM  Final   Special Requests NONE  Final   Sputum evaluation   Final    THIS SPECIMEN IS ACCEPTABLE FOR SPUTUM CULTURE Performed at [facility], [address], [place], [place] [address]    Report Status 02/17/2018 FINAL  Final  Culture, respiratory (NON-Expectorated)     Status: None (Preliminary result)   Collection Time: 02/17/18  3:55 AM  Result Value Ref Range Status   Specimen Description SPUTUM  Final   Special Requests NONE Reflexed from [id number]  Final   Gram Stain   Final    MODERATE WBC PRESENT, PREDOMINANTLY PMN RARE SQUAMOUS EPITHELIAL CELLS PRESENT ABUNDANT GRAM NEGATIVE RODS FEW GRAM NEGATIVE COCCI IN PAIRS Performed at [facility], [place] [address] [place], [place] [address]    Culture PENDING  Incomplete   Report Status PENDING  Incomplete      Radiology Studies: Dg Chest 2 View  Result Date: 02/16/2018 CLINICAL DATA:  Cough and shortness of breath for 3 days. EXAM: CHEST - 2 VIEW COMPARISON:  Single-view of the chest 12/23/2017. CT chest 11/20/2017. FINDINGS: The patient is status post CABG and aortic valve repair. There is cardiomegaly without edema. Mild left basilar atelectasis noted. No pneumothorax or pleural effusion. Remote right rib fractures are seen. IMPRESSION: Cardiomegaly without acute disease. Electronically Signed   By: Inge Rise M.D.   On: 02/16/2018 14:47    Scheduled Meds: . albuterol  2.5 mg Nebulization Q6H  . amLODipine  10 mg Oral Daily  . citalopram  10 mg Oral Daily  . clopidogrel  75 mg Oral Daily  . docusate sodium  100 mg Oral BID  . erythromycin   Both Eyes Q8H   . furosemide  40 mg Oral BID  . insulin aspart  0-20 Units Subcutaneous TID WC  . insulin aspart  0-5 Units Subcutaneous QHS  . insulin aspart  15 Units Subcutaneous STAT  . insulin aspart  20 Units Subcutaneous TID WC  . insulin glargine  40 Units Subcutaneous QHS  . isosorbide mononitrate  60 mg Oral Daily  . metoprolol succinate  50 mg Oral Daily  . Rivaroxaban  15 mg Oral Q supper  . rosuvastatin  40 mg Oral QPC supper  . saccharomyces boulardii  250 mg Oral BID   Continuous Infusions: . sodium chloride 50 mL/hr at 02/16/18 2236  .  ceFEPime (MAXIPIME) IV       LOS: 1 day   Time spent: 25 minutes.  Vance Gather, MD [facility] Pager [phone]  If 7PM-7AM, please contact night-coverage [email or web address] Password [facility] 02/17/2018, 12:55 PM

## 2018-02-18 ENCOUNTER — Inpatient Hospital Stay (HOSPITAL_COMMUNITY): Payer: Medicare Other

## 2018-02-18 LAB — BASIC METABOLIC PANEL
Anion gap: 12 (ref 5–15)
BUN: 63 mg/dL — AB (ref 6–20)
CO2: 23 mmol/L (ref 22–32)
CREATININE: 3.72 mg/dL — AB (ref 0.61–1.24)
Calcium: 8.6 mg/dL — ABNORMAL LOW (ref 8.9–10.3)
Chloride: 103 mmol/L (ref 101–111)
GFR calc Af Amer: 17 mL/min — ABNORMAL LOW (ref >60)
GFR, EST NON AFRICAN AMERICAN: 14 mL/min — AB (ref >60)
GLUCOSE: 181 mg/dL — AB (ref 65–99)
POTASSIUM: 3.5 mmol/L (ref 3.5–5.1)
SODIUM: 138 mmol/L (ref 135–145)

## 2018-02-18 LAB — GLUCOSE, CAPILLARY
GLUCOSE-CAPILLARY: 196 mg/dL — AB (ref 65–99)
GLUCOSE-CAPILLARY: 202 mg/dL — AB (ref 65–99)
Glucose-Capillary: 182 mg/dL — ABNORMAL HIGH (ref 65–99)
Glucose-Capillary: 183 mg/dL — ABNORMAL HIGH (ref 65–99)
Glucose-Capillary: 84 mg/dL (ref 65–99)

## 2018-02-18 MED ORDER — INSULIN GLARGINE 100 UNIT/ML ~~LOC~~ SOLN
40.0000 [IU] | Freq: Every day | SUBCUTANEOUS | Status: DC
  Administered 2018-02-18: 40 [IU] via SUBCUTANEOUS
  Filled 2018-02-18: qty 0.4

## 2018-02-18 MED ORDER — INSULIN ASPART 100 UNIT/ML ~~LOC~~ SOLN
15.0000 [IU] | Freq: Three times a day (TID) | SUBCUTANEOUS | Status: DC
  Administered 2018-02-18: 15 [IU] via SUBCUTANEOUS

## 2018-02-18 MED ORDER — FUROSEMIDE 40 MG PO TABS
40.0000 mg | ORAL_TABLET | Freq: Two times a day (BID) | ORAL | Status: DC
  Administered 2018-02-19: 40 mg via ORAL
  Filled 2018-02-18: qty 1

## 2018-02-18 MED ORDER — INSULIN ASPART 100 UNIT/ML ~~LOC~~ SOLN: 18.0000 [IU] | Freq: Three times a day (TID) | SUBCUTANEOUS | Status: DC

## 2018-02-18 NOTE — Evaluation (Signed)
Physical Therapy Evaluation Patient Details Name: Cameron [name] MRN: [id number] DOB: 1941-07-07 Today's Date: 02/18/2018   History of Present Illness  77 y.o. male with severe cough, Sepsis due to HCAP, respiratory failure. 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 Pt reports feeling fatigued compared to baseline but that he has not mobilized since admission. Pt with SpO2 94% at rest on RA and maintaining 90% on RA with gait. Pt with good tolerance for gait and stairs with slight unsteadiness noted with pt able to correct with decreased gait speed. Pt reports baseline functional status and does not require further therapy intervention. Pt educated for breathing technique and would benefit from incentive spirometer (none on unit and RN aware). Pt encouraged to walk daily with staff supervision.      Follow Up Recommendations No PT follow up    Equipment Recommendations  None recommended by PT    Recommendations for Other Services       Precautions / Restrictions Precautions Precautions: Fall      Mobility  Bed Mobility Overal bed mobility: Modified Independent             General bed mobility comments: HOB 25degrees  Transfers Overall transfer level: Modified independent                  Ambulation/Gait Ambulation/Gait assistance: Min guard Ambulation Distance (Feet): 500 Feet Assistive device: None;1 person hand held assist Gait Pattern/deviations: Step-through pattern;Decreased stride length   Gait velocity interpretation: <1.8 ft/sec, indicate of risk for recurrent falls General Gait Details: pt with decreased speed but reportedly baseline for pt. Pt with 2 partial LOB with gait able to self correct but increased stability with HHA. Pt with SpO2 90% or greater on RA throughout  Stairs Stairs: Yes Stairs assistance: Modified independent  (Device/Increase time) Stair Management: Two rails;Alternating pattern;Forwards Number of Stairs: 5    Wheelchair Mobility    Modified Rankin (Stroke Patients Only)       Balance Overall balance assessment: Needs assistance   Sitting balance-Leahy Scale: Normal       Standing balance-Leahy Scale: Good                               Pertinent Vitals/Pain Pain Assessment: No/denies pain    Home Living Family/patient expects to be discharged to:: Private residence Living Arrangements: Spouse/significant other Available Help at Discharge: Family;Available 24 hours/day Type of Home: House Home Access: Stairs to enter Entrance Stairs-Rails: Psychiatric nurse of Steps: 5 Home Layout: One level Home Equipment: None      Prior Function Level of Independence: Independent               Hand Dominance        Extremity/Trunk Assessment   Upper Extremity Assessment Upper Extremity Assessment: Overall WFL for tasks assessed    Lower Extremity Assessment Lower Extremity Assessment: Overall WFL for tasks assessed    Cervical / Trunk Assessment Cervical / Trunk Assessment: Other exceptions Cervical / Trunk Exceptions: forward head rounded shoulders  Communication   Communication: No difficulties  Cognition Arousal/Alertness: Awake/alert Behavior During Therapy: WFL for tasks assessed/performed Overall Cognitive Status: Within Functional Limits for tasks assessed  General Comments      Exercises     Assessment/Plan    PT Assessment Patent does not need any further PT services  PT Problem List         PT Treatment Interventions      PT Goals (Current goals can be found in the Care Plan section)  Acute Rehab PT Goals PT Goal Formulation: All assessment and education complete, DC therapy    Frequency     Barriers to discharge        Co-evaluation                AM-PAC PT "6 Clicks" Daily Activity  Outcome Measure Difficulty turning over in bed (including adjusting bedclothes, sheets and blankets)?: None Difficulty moving from lying on back to sitting on the side of the bed? : None Difficulty sitting down on and standing up from a chair with arms (e.g., wheelchair, bedside commode, etc,.)?: None Help needed moving to and from a bed to chair (including a wheelchair)?: None Help needed walking in hospital room?: A Little Help needed climbing 3-5 steps with a railing? : None 6 Click Score: 23    End of Session   Activity Tolerance: Patient tolerated treatment well Patient left: in chair;with call bell/phone within reach;with family/visitor present Nurse Communication: Mobility status PT Visit Diagnosis: Other abnormalities of gait and mobility (R26.89)    Time: 7579-7282 PT Time Calculation (min) (ACUTE ONLY): 16 min   Charges:   PT Evaluation $PT Eval Low Complexity: 1 Low     PT G CodesElwyn Reach, PT [phone]   Madelon Welsch B Holley Kocurek 02/18/2018, 11:37 AM

## 2018-02-18 NOTE — Progress Notes (Signed)
Inpatient Diabetes Program Recommendations  AACE/ADA: New Consensus Statement on Inpatient Glycemic Control (2015)  Target Ranges:  Prepandial:   less than 140 mg/dL      Peak postprandial:   less than 180 mg/dL (1-2 hours)      Critically ill patients:  140 - 180 mg/dL   Lab Results  Component Value Date   GLUCAP 182 (H) 02/18/2018   HGBA1C 8.8 02/06/2018    Review of Glycemic ControlResults for LAYNE, DILAURO (MRN [id number]) as of 02/18/2018 11:08  Ref. Range 02/17/2018 17:27 02/17/2018 21:12 02/17/2018 22:13 02/18/2018 06:25 02/18/2018 07:34  Glucose-Capillary Latest Ref Range: 65 - 99 mg/dL 98 60 (L) 93 196 (H) 182 (H)    Diabetes history: Type 2 DM  Outpatient Diabetes medications: NPH 50 units q HS, Novolin R 50 units q AM, 30 units with lunch, and 50 units with supper Current orders for Inpatient glycemic control:  Novolog 18 units tid with meals, Lantus 50 units q HS, Novolog resistant tid with meals and HS  Inpatient Diabetes Program Recommendations:   Note low blood sugars on 4/22. According to documentation, patient did not get Lantus last PM.  Please consider reducing Lantus to 40 units q HS and reduce Novolog meal coverage to 12 units tid with meals.  Sent text page to MD.    Thanks, Adah Perl, RN, BC-ADM Inpatient Diabetes Coordinator Pager [phone]

## 2018-02-18 NOTE — Progress Notes (Signed)
PROGRESS NOTE  BRAXON SUDER  BTD:[id number] DOB: 04/05/1941 DOA: 02/16/2018 PCP: Orpah Melter, MD  Outpatient Specialists: Virgina Jock, cardiology; Eliison, endocrinology Brief Narrative: Cameron [name] is a 77 y.o. 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 He reported subjective chills, but didn't take a temperature at home. He denies worsening orthopnea, says his legs are "less swollen than they've ever been," and denies chest pain or palpitations. Had some blood in his mouth earlier but none coughed up, and none since. Denies gross hematuria.   ED evaluation included vitals with tachypnea, AFib w/RBBB on ECG, rate in 100-110's, and rectal temperature of 101.101F. Hypertensive and hypoxic. He had bibasilar crackles. WBC 12.8, lactic acid 1.8, hgb 13. Cr up from baseline at 3.19, hyperglycemia to 391mg /dl with glucosuria and microscopic hematuria without ketonuria. CXR showed cardiomegaly without infiltrate, though with such suggestive history, HCAP Tx was started empirically with vanc/cefepime. Blood cultures drawn. Antibiotics ultimately narrowed to cefepime alone. Repeat CXR was benign.   Assessment & Plan: Principal Problem:   Acute respiratory failure (HCC) Active Problems:   Chronic diastolic heart failure (HCC)   Severe aortic stenosis   CKD (chronic kidney disease) stage 4, GFR 15-29 ml/min (HCC)   Obesity   COPD (chronic obstructive pulmonary disease) (HCC)   CAD (coronary artery disease)   PAF (paroxysmal atrial fibrillation) (HCC)   RBBB   Diabetes ([place])   HCAP (healthcare-associated pneumonia)   Sepsis ([place])  Sepsis due to HCAP:  Presumed Dx based on very typical history despite negative infiltrate on CXR. Flu negative, but may also be viral pneumonia.  - Continue cefepime (sputum Cx w/abundant GNRs, reincubated).  - Trend PCT in AM. Leukocytosis resolved.  Acute hypoxic respiratory failure: Repeat CXR is reassuring.  - Ambulated w/PT maintaining SpO2 >90% on room air today.    Chronic HFpEF: No pulmonary edema or vascular congestion on CXR. Below EDW (consistently >210lbs). Last echo s/p TAVR last month showed EF 60-65%, mod LVH w/G1DD, normal RV size/function, normally functioning AoV.  - Will hold PM dose of lasix due to mild creatinine bump and ongoing infection.  - Daily weights, I/O - Monitor renal function as below  CAD, PAD, PAF:  - Continue ASA, plavix, xarelto. No ongoing bleeding. - Continue metoprolol, give metoprolol IV prn HR sustained >100bpm (BP should sustain this)  IDT2DM: With significant insulin resistance based on home insulin requirements and last HbA1c of 8.8%. Unsure if he'll be eating the same diet as at home, so will modestly decrease home doses. Significantly hyperglycemic on admission without ketonuria.  - Developed some borderline hypoglycemia last night so lantus held. Will decrease mealtime to 15u TIDWC + res SSI (again, he takes 50-20-50 units TIDWC at home) and reduce lantus to 40 units - Follow up with Dr. Loanne Drilling will be helpful in continued treatment of his most poorly controlled risk factor.   AKI on stage IV CKD: Possibly due to sepsis. Pt does not report drinking less than usual, and compliant w/lasix.  - Monitor, avoiding nephrotoxins (DC vanc) - Hold pm lasix.  HTN: Continue home medications  DVT prophylaxis: Xarelto Code Status: Full Family Communication: Wife at bedside this  AM Disposition Plan: Home hopefully in 24 hrs.  Consultants:   None  Procedures:   None  Antimicrobials:  Vancomycin 4/21  Cefepime 4/21 >>    Subjective: Breathing mildly better  than at admission but cough is severe, though he reports this is a very chronic issue that 2 pulmonologists "couldn't figure out." No chest pain, palpitations. Feels weaker than usual but is getting around ok. No fevers/chills.   Objective: Vitals:   02/18/18 0020 02/18/18 0443 02/18/18 1134 02/18/18 1134  BP: (!) 143/63 (!) 110/56  (!) 114/56  Pulse: 79 (!) 104 74 93  Resp: 18 18    Temp: 98.5 F (36.9 C) 98.5 F (36.9 C)  99 F (37.2 C)  TempSrc: Oral Oral  Oral  SpO2: 92% 99% 90% 93%  Weight:  93.3 kg (205 lb 9.6 oz)    Height:        Intake/Output Summary (Last 24 hours) at 02/18/2018 1501 Last data filed at 02/18/2018 1014 Gross per 24 hour  Intake 982 ml  Output 700 ml  Net 282 ml   Filed Weights   02/16/18 2134 02/17/18 0400 02/18/18 0443  Weight: 94.7 kg (208 lb 12.8 oz) 94.1 kg (207 lb 8 oz) 93.3 kg (205 lb 9.6 oz)   Gen: Tired-appearing male in NAD HEENT: MMM, posterior oropharynx erythematous without exudates. No mucosal ulcerations or bleeding. Arcus senilis bilaterally with PERRL. Conjunctivae appear normal, no discharge.  Pulm: Nonlabored. No crackles or wheezing.  CV: Irreg irreg, rate in 90's, no murmur. Diminished DP and PT pulses. 1+ pitting LE edema.  GI: + BS; soft, obese, non-tender, non-distended Skin: LE's with stasis changes, decreased hair. R lower leg with pink discoloration without open wound, tenderness, induration or fluctuance. Unchanged. Neuro: Alert and oriented. No focal neurological deficits. Psych: Judgement and insight appear normal. Mood & affect appropriate.   Data Reviewed: I have personally reviewed following labs and imaging studies  CBC: Recent Labs  Lab 02/16/18 1422 02/17/18 0655  WBC 12.8* 9.2  HGB 13.0 12.4*  HCT 39.6 36.8*  MCV 81.5 81.1  PLT 170 242   Basic Metabolic Panel: Recent Labs  Lab 02/16/18 1422 02/17/18 0655 02/18/18 0543  NA 134* 137 138  K 4.4 3.8 3.5  CL 98* 102 103  CO2 21* 23 23  GLUCOSE 391*  267* 181*  BUN 55* 60* 63*  CREATININE 3.19* 3.35* 3.72*  CALCIUM 8.6* 8.6* 8.6*   CBG: Recent Labs  Lab 02/17/18 2112 02/17/18 2213 02/18/18 0625 02/18/18 0734 02/18/18 1132  GLUCAP 60* 93 196* 182* 183*   Urine analysis:    Component Value Date/Time   COLORURINE YELLOW 02/16/2018 1450   APPEARANCEUR HAZY (A) 02/16/2018 1450   LABSPEC 1.015 02/16/2018 1450   PHURINE 5.0 02/16/2018 1450   GLUCOSEU >=500 (A) 02/16/2018 1450   HGBUR LARGE (A) 02/16/2018 1450   BILIRUBINUR NEGATIVE 02/16/2018 1450   KETONESUR NEGATIVE 02/16/2018 1450   PROTEINUR 100 (A) 02/16/2018 1450   UROBILINOGEN 1.0 02/22/2013 1318   NITRITE NEGATIVE 02/16/2018 1450   LEUKOCYTESUR NEGATIVE 02/16/2018 1450   Recent Results (from the past 240 hour(s))  Culture, blood (routine x 2)     Status: None (Preliminary result)   Collection Time: 02/16/18  4:30 PM  Result Value Ref Range Status   Specimen Description BLOOD RIGHT ANTECUBITAL  Final   Special Requests   Final    BOTTLES DRAWN AEROBIC AND ANAEROBIC Blood Culture adequate volume   Culture   Final  NO GROWTH 2 DAYS Performed at [facility], [place] [address], [place], [place] [address]    Report Status PENDING  Incomplete  Culture, blood (routine x 2)     Status: None (Preliminary result)   Collection Time: 02/16/18  5:04 PM  Result Value Ref Range Status   Specimen Description BLOOD LEFT ANTECUBITAL  Final   Special Requests   Final    BOTTLES DRAWN AEROBIC AND ANAEROBIC Blood Culture adequate volume   Culture   Final    NO GROWTH 2 DAYS Performed at [facility], [place] [address], [place], [place] [address]    Report Status PENDING  Incomplete  MRSA PCR Screening     Status: None   Collection Time: 02/16/18 11:38 PM  Result Value Ref Range Status   MRSA by PCR NEGATIVE NEGATIVE Final    Comment:        The GeneXpert MRSA Assay (FDA approved for NASAL specimens only), is one component of a comprehensive MRSA  colonization surveillance program. It is not intended to diagnose MRSA infection nor to guide or monitor treatment for MRSA infections. Performed at [facility], [place] [address], [place], [place] [address]   Culture, sputum-assessment     Status: None   Collection Time: 02/17/18  3:55 AM  Result Value Ref Range Status   Specimen Description SPUTUM  Final   Special Requests NONE  Final   Sputum evaluation   Final    THIS SPECIMEN IS ACCEPTABLE FOR SPUTUM CULTURE Performed at [facility], [address] [place], [place] [address]    Report Status 02/17/2018 FINAL  Final  Culture, respiratory (NON-Expectorated)     Status: None (Preliminary result)   Collection Time: 02/17/18  3:55 AM  Result Value Ref Range Status   Specimen Description SPUTUM  Final   Special Requests NONE Reflexed from [id number]  Final   Gram Stain   Final    MODERATE WBC PRESENT, PREDOMINANTLY PMN RARE SQUAMOUS EPITHELIAL CELLS PRESENT ABUNDANT GRAM NEGATIVE RODS FEW GRAM NEGATIVE COCCI IN PAIRS    Culture   Final    CULTURE REINCUBATED FOR BETTER GROWTH Performed at [facility], [place] [address], [place], [place] [address]    Report Status PENDING  Incomplete      Radiology Studies: Dg Chest 2 View  Result Date: 02/18/2018 CLINICAL DATA:  Acute respiratory failure EXAM: CHEST - 2 VIEW COMPARISON:  02/16/2018 FINDINGS: Mild cardiomegaly. Low lung volumes. Bibasilar atelectasis. Normal vascularity. Aortic valve replacement hardware is in place. Status post CABG. No pneumothorax or pleural effusion. IMPRESSION: No active cardiopulmonary disease. Electronically Signed   By: Marybelle Killings M.D.   On: 02/18/2018 09:08    Scheduled Meds: . albuterol  2.5 mg Nebulization TID  . amLODipine  10 mg Oral Daily  . citalopram  10 mg Oral Daily  . clopidogrel  75 mg Oral Daily  . docusate sodium  100 mg Oral BID  . erythromycin   Both Eyes Q8H  . furosemide  40 mg Oral BID  . insulin aspart  0-20  Units Subcutaneous TID WC  . insulin aspart  0-5 Units Subcutaneous QHS  . insulin aspart  15 Units Subcutaneous TID WC  . insulin glargine  40 Units Subcutaneous QHS  . isosorbide mononitrate  60 mg Oral Daily  . metoprolol succinate  50 mg Oral Daily  . Rivaroxaban  15 mg Oral Q supper  . rosuvastatin  40 mg  Oral QPC supper  . saccharomyces boulardii  250 mg Oral BID   Continuous Infusions: . ceFEPime (MAXIPIME) IV 1 g (02/17/18 1744)     LOS: 2 days   Time spent: 25 minutes.  Vance Gather, MD [facility] Pager [phone]  If 7PM-7AM, please contact night-coverage [email or web address] Password [email or web address] 02/18/2018, 3:01 PM

## 2018-02-19 LAB — BASIC METABOLIC PANEL
Anion gap: 14 (ref 5–15)
BUN: 63 mg/dL — AB (ref 6–20)
CHLORIDE: 103 mmol/L (ref 101–111)
CO2: 22 mmol/L (ref 22–32)
CREATININE: 3.7 mg/dL — AB (ref 0.61–1.24)
Calcium: 8.5 mg/dL — ABNORMAL LOW (ref 8.9–10.3)
GFR calc Af Amer: 17 mL/min — ABNORMAL LOW (ref >60)
GFR calc non Af Amer: 14 mL/min — ABNORMAL LOW (ref >60)
GLUCOSE: 106 mg/dL — AB (ref 65–99)
POTASSIUM: 3.4 mmol/L — AB (ref 3.5–5.1)
Sodium: 139 mmol/L (ref 135–145)

## 2018-02-19 LAB — CULTURE, RESPIRATORY

## 2018-02-19 LAB — CULTURE, RESPIRATORY W GRAM STAIN: Culture: NORMAL

## 2018-02-19 LAB — PROCALCITONIN: Procalcitonin: 1.19 ng/mL

## 2018-02-19 LAB — GLUCOSE, CAPILLARY: Glucose-Capillary: 163 mg/dL — ABNORMAL HIGH (ref 65–99)

## 2018-02-19 MED ORDER — LEVOFLOXACIN IN D5W 500 MG/100ML IV SOLN
500.0000 mg | INTRAVENOUS | Status: DC
  Administered 2018-02-19: 500 mg via INTRAVENOUS
  Filled 2018-02-19: qty 100

## 2018-02-19 MED ORDER — LEVOFLOXACIN 500 MG PO TABS: 500.0000 mg | ORAL_TABLET | ORAL | 0 refills | Status: AC

## 2018-02-19 NOTE — Progress Notes (Signed)
Reviewed discharge instructions with patient and patient's family member. Answered their questions.  Patient is stable and ready for discharge.

## 2018-02-19 NOTE — Discharge Summary (Signed)
Physician Discharge Summary  Cameron [name] PPJ:[id number] DOB: September 04, 1941 DOA: 02/16/2018  PCP: Orpah Melter, MD  Admit date: 02/16/2018 Discharge date: 02/19/2018  Admitted From: Home Disposition: Home   Recommendations for Outpatient Follow-up:  1. Follow up with PCP in 1-2 weeks 2. Monitor BMP closely. SCr at discharge slightly above baseline at 3.7, stable.  3. Follow up with pulmonology for ongoing chronic cough evaluation.  4. Follow up with Dr. Loanne Drilling, endocrinology, for ongoing diabetes management.  5. Follow up with cardiology as previously scheduled. Doing well from this standpoint.  Home Health: None  Equipment/Devices: None Discharge Condition: Stable CODE STATUS: Full Diet recommendation: Heart healthy, carb-modified.   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 He reported subjective chills, but didn't take a temperature at home. He denies worsening orthopnea, says his legs are "less swollen than they've ever been," and denies chest pain or palpitations. Had some blood in his mouth earlier but none coughed up, and none since. Denies gross hematuria.   ED evaluation included vitals with tachypnea, AFib w/RBBB on ECG, rate in 100-110's, and rectal temperature of 101.59F. Hypertensive and hypoxic. He had bibasilar crackles. WBC 12.8, lactic acid 1.8, hgb 13. Cr up from baseline at 3.19, hyperglycemia to 391mg /dl with glucosuria and microscopic hematuria without ketonuria. CXR showed cardiomegaly without infiltrate, though with such suggestive history, HCAP Tx was started empirically with  vanc/cefepime. Blood cultures drawn. Vancomycin was discontinued with negative MRSA PCR and cefepime alone continued pending sputum culture results. Repeat CXR was benign and cefepime was changed to po levaquin (renally dosed q48h) to complete 7 days of therapy.   Discharge Diagnoses:  Principal Problem:   Acute respiratory failure ([place]) Active Problems:   Chronic diastolic heart failure (HCC)   Severe aortic stenosis   CKD (chronic kidney disease) stage 4, GFR 15-29 ml/min (HCC)   Obesity   COPD (chronic obstructive pulmonary disease) (HCC)   CAD (coronary artery disease)   PAF (paroxysmal atrial fibrillation) (HCC)   RBBB   Diabetes ([place])   HCAP (healthcare-associated pneumonia)   Sepsis ([place])  Sepsis due to HCAP: Presumed Dx based on very typical history despite negative infiltrate on CXR. Flu negative, but may also be viral pneumonia.  - Changed cefepime to levaquin prior to discharge (sputum Cx w/abundant GNRs, reincubated).  - Leukocytosis resolved. PCT declining 1.69 > 1.19.   Acute hypoxic respiratory failure: Repeat CXR is reassuring.  - Ambulated w/PT maintaining SpO2 >90% on room air. Dyspnea resolved.   Chronic HFpEF: No pulmonary edema or vascular congestion on CXR. Below EDW at 203lbs at discharge (consistently >210lbs). Last echo s/p TAVR last month showed EF 60-65%, mod LVH w/G1DD, normal RV size/function, normally functioning AoV.  - No pulmonary edema on CXR, continue home medications at discharge. If renal function worsens, may need to decrease lasix modestly.   CAD, PAD, PAF:  - Continue ASA, plavix, xarelto. No ongoing bleeding. - Continue metoprolol, crestor  IDT2DM: With significant insulin resistance based on home insulin requirements and last HbA1c of 8.8%. Unsure if he'll be eating the same diet as at home, so will modestly decrease home doses.  Significantly hyperglycemic on admission without ketonuria.  - Reached inpatient goal CBGs on slightly  decreased insulin doses from home, though suspect improved diet composition while admitted. Ok to restart home meds at discharge.  - Follow up with Dr. Loanne Drilling will be helpful in continued treatment of his most poorly controlled risk factor.   AKI on stage IV CKD: Possibly due to sepsis/infection/decreased po intake. A single dose of lasix was held and creatinine is stable.  - Recommend PCP and/or nephrology follow up, repeat BMP in 1-2 weeks.  - Levaquin renally dosed (w/GNRs on sputum culture, unknown if pseudomonas so needed po option to cover this).   HTN: Continue home medications  Discharge Instructions Discharge Instructions    Diet - low sodium heart healthy   Complete by:  As directed    Discharge instructions   Complete by:  As directed    You were admitted for pneumonia and have improved an antibiotics. Your chest xray looks essentially normal, much improved and you are stable for discharge.  - Take one more dose of antibiotics on Friday (4/26) in the morning. This will be your last dose of antibiotics.  - I will monitor the sputum culture final results (still pending at time of discharge) and call you to change therapy if that is necessary.  - If your symptoms worsen, seek medical attention. Otherwise, follow up with your healthcare providers in the next few weeks.  - It was a pleasure taking care of you. - Vance Gather, MD from [facility].   Increase activity slowly   Complete by:  As directed      Allergies as of 02/19/2018      Reactions   Cefuroxime Diarrhea   Ezetimibe Diarrhea      Medication List    TAKE these medications   amLODipine 10 MG tablet Commonly known as:  NORVASC Take 10 mg by mouth daily.   ammonium lactate 12 % lotion Commonly known as:  AMLACTIN Apply 1 application topically as needed for dry skin. Recommend twice daily until dry skin improved   amoxicillin 500 MG capsule Commonly known as:  AMOXIL Take 4 tablets (2g total) one hour prior to  dental appointment What changed:    how much to take  how to take this  when to take this  additional instructions   aspirin 81 MG EC tablet Take 1 tablet (81 mg total) by mouth daily.   citalopram 10 MG tablet Commonly known as:  CELEXA Take 1 tablet (10 mg total) by mouth daily.   clopidogrel 75 MG tablet Commonly known as:  PLAVIX Take 1 tablet (75 mg total) by mouth daily.   furosemide 40 MG tablet Commonly known as:  LASIX Take 1 tablet (40 mg total) by mouth 2 (two) times daily.   glucose blood test strip Commonly known as:  ONETOUCH VERIO 1 each by Other route 2 (two) times daily. And lancets 2/day   insulin NPH Human 100 UNIT/ML injection Commonly known as:  HUMULIN N,NOVOLIN N Inject 0.5 mLs (50 Units total) into the skin at bedtime.   insulin regular 100 units/mL injection Commonly known as:  NOVOLIN R,HUMULIN R 3 times a day (just before each meal), 50-30-50 units, and syringes 4/day   isosorbide mononitrate 60 MG 24 hr tablet Commonly known as:  IMDUR Take 60 mg by mouth daily.   levofloxacin 500 MG tablet Commonly known as:  LEVAQUIN Take 1 tablet (500 mg total) by mouth every other day for 2 days.  Start taking on:  02/21/2018   metoprolol succinate 50 MG 24 hr tablet Commonly known as:  TOPROL-XL Take 50 mg by mouth daily. Take with or immediately following a meal.   nitroGLYCERIN 0.4 MG SL tablet Commonly known as:  NITROSTAT Place 0.4 mg every 5 (five) minutes as needed under the tongue for chest pain.   Rivaroxaban 15 MG Tabs tablet Commonly known as:  XARELTO Take 1 tablet (15 mg total) by mouth daily with supper.   rosuvastatin 40 MG tablet Commonly known as:  CRESTOR Take 40 mg daily after supper by mouth.      Follow-up Information    Orpah Melter, MD Follow up.   Specialty:  Family Medicine Contact information: [place] [address] [phone]        Renato Shin, MD Follow up.   Specialty:   Endocrinology Contact information: [address] [place] [address] [phone]        Nigel Mormon, MD .   Specialty:  Cardiology Contact information: [place] [address] [phone]        [facility] .   Specialty:  Pulmonology Contact information: [place] [phone]         Allergies  Allergen Reactions  . Cefuroxime Diarrhea  . Ezetimibe Diarrhea    Consultations:  None  Procedures/Studies: Dg Chest 2 View  Result Date: 02/18/2018 CLINICAL DATA:  Acute respiratory failure EXAM: CHEST - 2 VIEW COMPARISON:  02/16/2018 FINDINGS: Mild cardiomegaly. Low lung volumes. Bibasilar atelectasis. Normal vascularity. Aortic valve replacement hardware is in place. Status post CABG. No pneumothorax or pleural effusion. IMPRESSION: No active cardiopulmonary disease. Electronically Signed   By: Marybelle Killings M.D.   On: 02/18/2018 09:08   Dg Chest 2 View  Result Date: 02/16/2018 CLINICAL DATA:  Cough and shortness of breath for 3 days. EXAM: CHEST - 2 VIEW COMPARISON:  Single-view of the chest 12/23/2017. CT chest 11/20/2017. FINDINGS: The patient is status post CABG and aortic valve repair. There is cardiomegaly without edema. Mild left basilar atelectasis noted. No pneumothorax or pleural effusion. Remote right rib fractures are seen. IMPRESSION: Cardiomegaly without acute disease. Electronically Signed   By: Inge Rise M.D.   On: 02/16/2018 14:47    Subjective: Cough improving, sputum volume greatly decreased. Dyspnea resolved. No Chest pain. Still feels fatigued, "but then again, I am 77 years old." His cough, he states, is years in duration but back to baseline. No fevers.   Discharge Exam: Vitals:   02/19/18 0420 02/19/18 0834  BP: (!) 135/59 129/82  Pulse: (!) 122 (!) 108  Resp:    Temp: 98.2 F (36.8 C)   SpO2: 94%    General: Pleasant male  sitting in bed in no distress Cardiovascular: Irreg with rate in 90's (improved from vital signs taken earlier). No murmur or JVD. Trace pitting LE edema. DP and PT pulses 1+.  Respiratory: CTA bilaterally, no wheezing, no rhonchi Abdominal: Soft, NT, ND, bowel sounds + Skin: LE's with stasis changes, decreased hair. R lower leg with pink discoloration without open wound, tenderness, induration or fluctuance. Unchanged.  Labs: BNP (last 3 results) Recent Labs    09/20/17 0253 09/21/17 0347 11/29/17 1417  BNP 274.2* 237.3* 893.8*   Basic Metabolic Panel: Recent Labs  Lab 02/16/18 1422 02/17/18 0655 02/18/18 0543 02/19/18 0445  NA 134* 137 138 139  K 4.4 3.8  3.5 3.4*  CL 98* 102 103 103  CO2 21* 23 23 22   GLUCOSE 391* 267* 181* 106*  BUN 55* 60* 63* 63*  CREATININE 3.19* 3.35* 3.72* 3.70*  CALCIUM 8.6* 8.6* 8.6* 8.5*   Liver Function Tests: No results for input(s): AST, ALT, ALKPHOS, BILITOT, PROT, ALBUMIN in the last 168 hours. No results for input(s): LIPASE, AMYLASE in the last 168 hours. No results for input(s): AMMONIA in the last 168 hours. CBC: Recent Labs  Lab 02/16/18 1422 02/17/18 0655  WBC 12.8* 9.2  HGB 13.0 12.4*  HCT 39.6 36.8*  MCV 81.5 81.1  PLT 170 158   Cardiac Enzymes: No results for input(s): CKTOTAL, CKMB, CKMBINDEX, TROPONINI in the last 168 hours. BNP: Invalid input(s): POCBNP CBG: Recent Labs  Lab 02/18/18 0734 02/18/18 1132 02/18/18 1745 02/18/18 2236 02/19/18 0730  GLUCAP 182* 183* 202* 84 163*   D-Dimer No results for input(s): DDIMER in the last 72 hours. Hgb A1c No results for input(s): HGBA1C in the last 72 hours. Lipid Profile No results for input(s): CHOL, HDL, LDLCALC, TRIG, CHOLHDL, LDLDIRECT in the last 72 hours. Thyroid function studies No results for input(s): TSH, T4TOTAL, T3FREE, THYROIDAB in the last 72 hours.  Invalid input(s): FREET3 Anemia work up No results for input(s): VITAMINB12, FOLATE, FERRITIN,  TIBC, IRON, RETICCTPCT in the last 72 hours. Urinalysis    Component Value Date/Time   COLORURINE YELLOW 02/16/2018 1450   APPEARANCEUR HAZY (A) 02/16/2018 1450   LABSPEC 1.015 02/16/2018 1450   PHURINE 5.0 02/16/2018 1450   GLUCOSEU >=500 (A) 02/16/2018 1450   HGBUR LARGE (A) 02/16/2018 1450   BILIRUBINUR NEGATIVE 02/16/2018 1450   KETONESUR NEGATIVE 02/16/2018 1450   PROTEINUR 100 (A) 02/16/2018 1450   UROBILINOGEN 1.0 02/22/2013 1318   NITRITE NEGATIVE 02/16/2018 1450   LEUKOCYTESUR NEGATIVE 02/16/2018 1450    Microbiology Recent Results (from the past 240 hour(s))  Culture, blood (routine x 2)     Status: None (Preliminary result)   Collection Time: 02/16/18  4:30 PM  Result Value Ref Range Status   Specimen Description BLOOD RIGHT ANTECUBITAL  Final   Special Requests   Final    BOTTLES DRAWN AEROBIC AND ANAEROBIC Blood Culture adequate volume   Culture   Final    NO GROWTH 2 DAYS Performed at [facility], [place] [address], [place], [place] [address]    Report Status PENDING  Incomplete  Culture, blood (routine x 2)     Status: None (Preliminary result)   Collection Time: 02/16/18  5:04 PM  Result Value Ref Range Status   Specimen Description BLOOD LEFT ANTECUBITAL  Final   Special Requests   Final    BOTTLES DRAWN AEROBIC AND ANAEROBIC Blood Culture adequate volume   Culture   Final    NO GROWTH 2 DAYS Performed at [facility], [place] [address], [place], [place] [address]    Report Status PENDING  Incomplete  MRSA PCR Screening     Status: None   Collection Time: 02/16/18 11:38 PM  Result Value Ref Range Status   MRSA by PCR NEGATIVE NEGATIVE Final    Comment:        The GeneXpert MRSA Assay (FDA approved for NASAL specimens only), is one component of a comprehensive MRSA colonization surveillance program. It is not intended to diagnose MRSA infection nor to guide or monitor treatment for MRSA infections. Performed at [facility],  [place] [address], [place], [place] [address]  Culture, sputum-assessment     Status: None   Collection Time: 02/17/18  3:55 AM  Result Value Ref Range Status   Specimen Description SPUTUM  Final   Special Requests NONE  Final   Sputum evaluation   Final    THIS SPECIMEN IS ACCEPTABLE FOR SPUTUM CULTURE Performed at [facility], [address], [place], [place] [address]    Report Status 02/17/2018 FINAL  Final  Culture, respiratory (NON-Expectorated)     Status: None (Preliminary result)   Collection Time: 02/17/18  3:55 AM  Result Value Ref Range Status   Specimen Description SPUTUM  Final   Special Requests NONE Reflexed from [id number]  Final   Gram Stain   Final    MODERATE WBC PRESENT, PREDOMINANTLY PMN RARE SQUAMOUS EPITHELIAL CELLS PRESENT ABUNDANT GRAM NEGATIVE RODS FEW GRAM NEGATIVE COCCI IN PAIRS    Culture   Final    CULTURE REINCUBATED FOR BETTER GROWTH Performed at [facility], [place] [address] [place], [place] [address]    Report Status PENDING  Incomplete    Time coordinating discharge: Approximately 40 minutes  Vance Gather, MD  [facility] 02/19/2018, 8:49 AM Pager [phone]

## 2018-02-19 NOTE — Progress Notes (Signed)
Pharmacy Antibiotic Note  Cameron [name] is a 77 y.o. male admitted on 02/16/2018 with pneumonia.  Pharmacy has been consulted to change cefepime to Levaquin.  Plan: Levaquin 500mg  IV Q48H.  Height: 5\' 9"  (175.3 cm) Weight: 203 lb 9.6 oz (92.4 kg) IBW/kg (Calculated) : 70.7  Temp (24hrs), Avg:98.7 F (37.1 C), Min:98.2 F (36.8 C), Max:99 F (37.2 C)  Recent Labs  Lab 02/16/18 1422 02/16/18 1630 02/17/18 0655 02/18/18 0543 02/19/18 0445  WBC 12.8*  --  9.2  --   --   CREATININE 3.19*  --  3.35* 3.72* 3.70*  LATICACIDVEN  --  1.8  --   --   --     Estimated Creatinine Clearance: 18.8 mL/min (A) (by C-G formula based on SCr of 3.7 mg/dL (H)).    Allergies  Allergen Reactions  . Cefuroxime Diarrhea  . Ezetimibe Diarrhea     Thank you for allowing pharmacy to be a part of this patient's care.  Wynona Neat, PharmD, BCPS  02/19/2018 7:55 AM

## 2018-02-21 LAB — CULTURE, BLOOD (ROUTINE X 2)
CULTURE: NO GROWTH
CULTURE: NO GROWTH
SPECIAL REQUESTS: ADEQUATE
Special Requests: ADEQUATE

## 2018-02-28 ENCOUNTER — Ambulatory Visit (INDEPENDENT_AMBULATORY_CARE_PROVIDER_SITE_OTHER): Payer: Medicare Other | Admitting: Family Medicine

## 2018-02-28 ENCOUNTER — Encounter: Payer: Self-pay | Admitting: Family Medicine

## 2018-02-28 VITALS — BP 112/54 | HR 81 | Temp 98.0°F | Ht 67.5 in | Wt 207.9 lb

## 2018-02-28 DIAGNOSIS — I10 Essential (primary) hypertension: Secondary | ICD-10-CM | POA: Diagnosis not present

## 2018-02-28 DIAGNOSIS — Z8701 Personal history of pneumonia (recurrent): Secondary | ICD-10-CM

## 2018-02-28 DIAGNOSIS — Z794 Long term (current) use of insulin: Secondary | ICD-10-CM

## 2018-02-28 DIAGNOSIS — Z7689 Persons encountering health services in other specified circumstances: Secondary | ICD-10-CM

## 2018-02-28 DIAGNOSIS — E1122 Type 2 diabetes mellitus with diabetic chronic kidney disease: Secondary | ICD-10-CM | POA: Diagnosis not present

## 2018-02-28 DIAGNOSIS — N184 Chronic kidney disease, stage 4 (severe): Secondary | ICD-10-CM

## 2018-02-28 NOTE — Patient Instructions (Signed)
Diabetes Mellitus and Sick Day Management Blood sugar (glucose) can be difficult to control when you are sick. Common illnesses that can cause problems for people with diabetes (diabetes mellitus) include colds, fever, flu (influenza), nausea, vomiting, and diarrhea. These illnesses can cause stress and loss of body fluids (dehydration), and those issues can cause blood glucose levels to increase. Because of this, it is very important to take your insulin and diabetes medicines and eat some form of carbohydrate when you are sick. You should make a plan for days when you are sick (sick day plan) as part of your diabetes management plan. You and your health care provider should make this plan in advance. The following guidelines are intended to help you manage an illness that lasts for about 24 hours or less. Your health care provider may also give you more specific instructions. What do I need to do to manage my blood glucose?  Check your blood glucose every 2-4 hours, or as often as told by your health care provider.  Know your sick day treatment goals. Your target blood glucose levels may be different when you are sick.  If you use insulin, take your usual dose. ? If your blood glucose continues to be too high, you may need to take an additional insulin dose as told by your health care provider.  If you use oral diabetes medicine, you may need to stop taking it if you are not able to eat or drink normally. Ask your health care provider about whether you need to stop taking these medicines while you are sick.  If you use injectable hormone medicines other than insulin to control your diabetes, ask your health care provider about whether you need to stop taking these medicines while you are sick. What else can I do to manage my diabetes when I am sick? Check your ketones  If you have type 1 diabetes, check your urine ketones every 4 hours.  If you have type 2 diabetes, check your urine ketones as  often as told by your health care provider. Drink fluids  Drink enough fluid to keep your urine clear or pale yellow. This is especially important if you have a fever, vomiting, or diarrhea. Those symptoms can lead to dehydration.  Follow any instructions from your health care provider about beverages to avoid. ? Do not drink alcohol, caffeine, or drinks that contain a lot of sugar. Take medicines as directed  Take-over-the-counter and prescription medicines only as told by your health care provider.  Check medicine labels for added sugars. Some medicines may contain sugar or types of sugars that can raise your blood glucose level. What foods can I eat when I am sick? You need to eat some form of carbohydrates when you are sick. You should eat 45-50 grams (45-50 g) of carbohydrates every 3-4 hours until you feel better. All of the food choices below contain about 15 g of carbohydrates. Plan ahead and keep some of these foods around so you have them if you get sick.  4-6 oz (120-177 mL) carbonated beverage that contains sugar, such as regular (not diet) soda. You may be able to drink carbonated beverages more easily if you open the beverage and let it sit at room temperature for a few minutes before drinking.   of a twin frozen ice pop.  4 oz (120 g) regular gelatin.  4 oz (120 mL) fruit juice.  4 oz (120 g) ice cream or frozen yogurt.  2 oz (60  g) sherbet.  8 oz (240 mL) clear broth or soup.  4 oz (120 g) regular custard.  4 oz (120 g) regular pudding.  8 oz (240 g) plain yogurt.  1 slice bread or toast.  6 saltine crackers.  5 vanilla wafers.  Questions to ask your health care provider Consider asking the following questions so you know what to do on days when you are sick:  Should I adjust my diabetes medicines?  How often do I need to check my blood glucose?  What supplies do I need to manage my diabetes at home when I am sick?  What number can I call if I have  questions?  What foods and drinks should I avoid?  Contact a health care provider if:  You develop symptoms of diabetic ketoacidosis, such as: ? Fatigue. ? Weight loss. ? Excessive thirst. ? Light-headedness. ? Fruity or sweet-smelling breath. ? Excessive urination. ? Vision changes. ? Confusion or irritability. ? Nausea. ? Vomiting. ? Rapid breathing. ? Pain in the abdomen. ? Feeling flushed.  You are unable to drink fluids without vomiting.  You have any of the following for more than 6 hours: ? Nausea. ? Vomiting. ? Diarrhea.  Your blood glucose is at or above 240 mg/dL (13.3 mmol/L), even after you take an additional insulin dose.  You have a change in how you think, feel, or act (mental status).  You develop another serious illness.  You have been sick or have had a fever for 2 days or longer and you are not getting better. Get help right away if:  Your blood glucose is lower than 54 mg/dL (3.0 mmol/L).  You have difficulty breathing.  You have moderate or high ketone levels in your urine.  You used emergency glucagon to treat low blood glucose. Summary  Blood sugar (glucose) can be difficult to control when you are sick. Common illnesses that can cause problems for people with diabetes (diabetes mellitus) include colds, fever, flu (influenza), nausea, vomiting, and diarrhea.  Illnesses can cause stress and loss of body fluids (dehydration), and those issues can cause blood glucose levels to increase.  Make a plan for days when you are sick (sick day plan) as part of your diabetes management plan. You and your health care provider should make this plan in advance.  It is very important to take your insulin and diabetes medicines and to eat some form of carbohydrate when you are sick.  Contact your health care provider if have problems managing your blood glucose levels when you are sick, or if you have been sick or had a fever for 2 days or longer and are  not getting better. This information is not intended to replace advice given to you by your health care provider. Make sure you discuss any questions you have with your health care provider. Document Released: 10/18/2003 Document Revised: 07/13/2016 Document Reviewed: 07/13/2016 Elsevier Interactive Patient Education  [employer].

## 2018-02-28 NOTE — Progress Notes (Signed)
Patient presents to clinic today to f/u on chronic issues and to establish care. Pt is accompanied by his wife.  SUBJECTIVE: PMH: Patient is a 77 year old male with pmh sig for CAD s/p CABG 2002, CKD stage IV, AS s/p TAVR, PAF with RBBB, diastolic CHF, type 2 diabetes, HTN, and HLD.  Patient is followed by numerous specialists including cardiologist Dr. Vernell Leep.  Recent hospitalization for HCAP: -Patient admitted from 4/21-4/24/2019 -Patient noted to have productive cough with sputum, tachypnea, hypertensive and hypoxic with bibasilar crackles on exam.  WBC 12.8, hyperglycemia 391 with glucose urea microscopic hematuria without ketonuria.  CXR with cardiomegaly, no infiltrate -Pt started on Vanc and cefepime, then transitioned to p.o. Levaquin renally dosed to complete a 7-day course of therapy once blood and sputum cultures were negative. -Pt endorses decreased energy since d/c. -Denies fever, increased sputum production, chills, SOB. -h/o COPD in chart however pt does not recall being told this.  Diabetes type 2: -Patient is currently taking 50 units of Humulin R 3 times daily with meals and 50 units of Novolin in at bedtime. -Patient has been checking FS BS at home.  Typically runs 90 and up to the 200s. -Patient denies symptoms of hypo-or hyper glycemia. -Patient states he has been eating whenever including mashed potatoes several times per week. -Patient is followed by endocrinology, Dr. Loanne Drilling -Patient is not currently exercising.  HTN: -Patient does not check BP at home. -He is currently taking Norvasc 10 mg  CHF: -Patient is followed by cardiology, Dr. Virgina Jock -Patient is currently taking Norvasc 10 mg, Lasix 40 mg, indoor 60 mg, Toprol-XL 50 mg -Patient denies changes in weight or shortness of breath  PAD/CAD: -Currently on Xarelto 50 mg and ASA 81 mg -Was on Plavix in the past but was recently discontinued 2/2 area of bleeding from pt's  scalp.  Allergies: Cefuroxime-diarrhea Ezetimibe-diarrhea  Past surgical history: Hiatal hernia repair TAVR Angioplasty  Social history: Patient is married.  He is retired.  He denies alcohol, tobacco, drug use.  Health Maintenance: Dental -- Dr. Joylene Macy Vision -- Dr. Prudencio Burly Nephrology-- Dr. Joelyn Oms Immunizations --influenza vaccine 2018  Past Medical History:  Diagnosis Date  . CAD (coronary artery disease)    a. 2002: s/p CABG x 4V (LIMA-LAD, seq SVG-PDA, PLA, seq SVG-OM1, OM2, SVG-Diag1)  . CKD (chronic kidney disease)   . COPD (chronic obstructive pulmonary disease) ([place])   . Diabetes mellitus without complication ([place])   . HLD (hyperlipidemia)   . Hypertension   . Obesity   . PAF (paroxysmal atrial fibrillation) ([place])   . RBBB   . 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 Severe aortic stenosis     Past Surgical History:  Procedure Laterality Date  . ABDOMINAL AORTAGRAM Right 01/28/2018   LOWER EXTREMITY  . ABDOMINAL AORTOGRAM W/LOWER EXTREMITY N/A 01/28/2018   Procedure: ABDOMINAL AORTOGRAM W/LOWER EXTREMITY [place];  Surgeon: Nigel Mormon, MD;  [facility]: [facility];  Service: Cardiovascular;  Laterality: N/A;  . CARDIAC SURGERY    . CORONARY BALLOON ANGIOPLASTY N/A 09/23/2017   Procedure: CORONARY BALLOON ANGIOPLASTY;  Surgeon: Nigel Mormon, MD;  Location: [facility];  Service: Cardiovascular;  Laterality: N/A;  . FINGER AMPUTATION    . HERNIA REPAIR    . open heart surgery    . PERIPHERAL VASCULAR BALLOON ANGIOPLASTY  01/28/2018   Procedure: PERIPHERAL VASCULAR BALLOON ANGIOPLASTY;  Surgeon: Nigel Mormon, MD;  Location: [facility];  Service: Cardiovascular;;  Rt SFA  . RIGHT HEART CATH AND CORONARY/GRAFT ANGIOGRAPHY N/A 09/17/2017   Procedure: RIGHT HEART CATH AND CORONARY/GRAFT ANGIOGRAPHY;  Surgeon:  Nigel Mormon, MD;  Location: [facility];  Service: Cardiovascular;  Laterality: N/A;  . TEE WITHOUT CARDIOVERSION N/A 12/03/2017   Procedure: TRANSESOPHAGEAL ECHOCARDIOGRAM (TEE);  Surgeon: Sherren Mocha, MD;  Location: [facility];  Service: Open Heart Surgery;  Laterality: N/A;  . TOE AMPUTATION    . TRANSCATHETER AORTIC VALVE REPLACEMENT, TRANSFEMORAL N/A 12/03/2017   Procedure: TRANSCATHETER AORTIC VALVE REPLACEMENT, TRANSFEMORAL;  Surgeon: Sherren Mocha, MD;  Location: [facility];  Service: Open Heart Surgery;  Laterality: N/A;  . ULTRASOUND GUIDANCE FOR VASCULAR ACCESS  09/17/2017   Procedure: Ultrasound Guidance For Vascular Access;  Surgeon: Nigel Mormon, MD;  Location: [facility];  Service: Cardiovascular;;    Current Outpatient Medications on File Prior to Visit  Medication Sig Dispense Refill  . amLODipine (NORVASC) 10 MG tablet Take 10 mg by mouth daily.    Marland Kitchen ammonium lactate (AMLACTIN) 12 % lotion Apply 1 application topically as needed for dry skin. Recommend twice daily until dry skin improved 500 g 0  . amoxicillin (AMOXIL) 500 MG capsule Take 4 tablets (2g total) one hour prior to dental appointment (Patient taking differently: Take 2,000 mg by mouth See admin instructions. Take 4 tablets (2g total) one hour prior to dental appointment) 4 capsule 12  . aspirin EC 81 MG EC tablet Take 1 tablet (81 mg total) by mouth daily.    . citalopram (CELEXA) 10 MG tablet Take 1 tablet (10 mg total) by mouth daily. 60 tablet 3  . furosemide (LASIX) 40 MG tablet Take 1 tablet (40 mg total) by mouth 2 (two) times daily. 60 tablet 11  . glucose blood (ONETOUCH VERIO) test strip 1 each by Other route 2 (two) times daily. And lancets 2/day 100 each 12  . insulin NPH Human (HUMULIN N,NOVOLIN N) 100 UNIT/ML injection Inject 0.5 mLs (50 Units total) into the skin at bedtime. 20 mL 11  . insulin regular (NOVOLIN R,HUMULIN R) 100 units/mL injection 3 times a day (just before each  meal), 50-30-50 units, and syringes 4/day 50 mL 11  . isosorbide mononitrate (IMDUR) 60 MG 24 hr tablet Take 60 mg by mouth daily.    . metoprolol succinate (TOPROL-XL) 50 MG 24 hr tablet Take 50 mg by mouth daily. Take with or immediately following a meal.    . nitroGLYCERIN (NITROSTAT) 0.4 MG SL tablet Place 0.4 mg every 5 (five) minutes as needed under the tongue for chest pain.    . Rivaroxaban (XARELTO) 15 MG TABS tablet Take 1 tablet (15 mg total) by mouth daily with supper. 60 tablet 3  . rosuvastatin (CRESTOR) 40 MG tablet Take 40 mg daily after supper by mouth.      No current facility-administered medications on file prior to visit.     Allergies  Allergen Reactions  . Cefuroxime Diarrhea  . Ezetimibe Diarrhea    Family History  Problem Relation Age of Onset  . Diabetes Mother   . Heart disease Father   . Stomach cancer Paternal Grandfather   . Lung cancer Maternal Grandmother        smoked    Social History   Socioeconomic History  . Marital status: Married    Spouse name: Not on file  . Number of children: 2  .  Years of education: Not on file  . Highest education level: Not on file  Occupational History  . Occupation: Doctor, general practice  Social Needs  . Financial resource strain: Not on file  . Food insecurity:    Worry: Not on file    Inability: Not on file  . Transportation needs:    Medical: Not on file    Non-medical: Not on file  Tobacco Use  . Smoking status: Former Smoker    Packs/day: 2.00    Years: 20.00    Pack years: 40.00    Types: Cigarettes    Last attempt to quit: 09/28/1978    Years since quitting: 39.4  . Smokeless tobacco: Never Used  Substance and Sexual Activity  . Alcohol use: No  . Drug use: No  . Sexual activity: Not on file  Lifestyle  . Physical activity:    Days per week: Not on file    Minutes per session: Not on file  . Stress: Not on file  Relationships  . Social connections:    Talks on phone: Not on file     Gets together: Not on file    Attends religious service: Not on file    Active member of club or organization: Not on file    Attends meetings of clubs or organizations: Not on file    Relationship status: Not on file  . Intimate partner violence:    Fear of current or ex partner: Not on file    Emotionally abused: Not on file    Physically abused: Not on file    Forced sexual activity: Not on file  Other Topics Concern  . Not on file  Social History Narrative  . Not on file    ROS General: Denies fever, chills, night sweats, changes in weight, changes in appetite  +fatgued HEENT: Denies headaches, ear pain, changes in vision, rhinorrhea, sore throat CV: Denies CP, palpitations, SOB, orthopnea Pulm: Denies SOB, cough, wheezing GI: Denies abdominal pain, nausea, vomiting, diarrhea, constipation GU: Denies dysuria, hematuria, frequency, vaginal discharge Msk: Denies muscle cramps, joint pains Neuro: Denies weakness, numbness, tingling Skin: Denies rashes, bruising  + healing wound on head Psych: Denies depression, anxiety, hallucinations  BP (!) 112/54 (BP Location: Right Arm, Patient Position: Sitting, Cuff Size: Large)   Pulse 81   Temp 98 F (36.7 C) (Oral)   Ht 5' 7.5" (1.715 m)   Wt 207 lb 14.4 oz (94.3 kg)   SpO2 93%   BMI 32.08 kg/m   Physical Exam Gen. Pleasant, well developed, well-nourished, in NAD HEENT - [place]/AT, PERRL, no scleral icterus, no nasal drainage, pharynx without erythema or exudate. Neck: No JVD, no thyromegaly, no carotid bruits Lungs: no use of accessory muscles, CTAB, no wheezes, rales or rhonchi Cardiovascular: RRR, No r/g/m, trace edema Abdomen: BS present, soft, nontender,nondistended Neuro:  A&Ox3, CN II-XII intact, normal gait Skin:  Warm, dry, intact.  LEs with venous stasis changes, R leg with rosy tint, no erythema or induration.  Midline scalp on sagittal suture with healing area, thick eschar in place. Psych: normal affect, mood  appropriate  Recent Results (from the past 2160 hour(s))  Glucose, capillary     Status: None   Collection Time: 12/03/17 11:08 AM  Result Value Ref Range   Glucose-Capillary 79 65 - 99 mg/dL   Comment 1 Notify RN    Comment 2 Document in Chart   Glucose, capillary     Status: Abnormal   Collection Time: 12/03/17 12:07  PM  Result Value Ref Range   Glucose-Capillary 63 (L) 65 - 99 mg/dL   Comment 1 Notify RN    Comment 2 Document in Chart   Glucose, capillary     Status: Abnormal   Collection Time: 12/03/17 12:36 PM  Result Value Ref Range   Glucose-Capillary 179 (H) 65 - 99 mg/dL   Comment 1 Notify RN    Comment 2 Document in Chart   I-STAT, chem 8     Status: Abnormal   Collection Time: 12/03/17  2:48 PM  Result Value Ref Range   Sodium 141 135 - 145 mmol/L   Potassium 3.9 3.5 - 5.1 mmol/L   Chloride 103 101 - 111 mmol/L   BUN 40 (H) 6 - 20 mg/dL   Creatinine, Ser 2.50 (H) 0.61 - 1.24 mg/dL   Glucose, Bld 112 (H) 65 - 99 mg/dL   Calcium, Ion 1.15 1.15 - 1.40 mmol/L   TCO2 30 22 - 32 mmol/L   Hemoglobin 12.6 (L) 13.0 - 17.0 g/dL   HCT 37.0 (L) 39.0 - 52.0 %  I-STAT, chem 8     Status: Abnormal   Collection Time: 12/03/17  3:26 PM  Result Value Ref Range   Sodium 141 135 - 145 mmol/L   Potassium 3.6 3.5 - 5.1 mmol/L   Chloride 103 101 - 111 mmol/L   BUN 39 (H) 6 - 20 mg/dL   Creatinine, Ser 2.40 (H) 0.61 - 1.24 mg/dL   Glucose, Bld 102 (H) 65 - 99 mg/dL   Calcium, Ion 1.12 (L) 1.15 - 1.40 mmol/L   TCO2 30 22 - 32 mmol/L   Hemoglobin 12.9 (L) 13.0 - 17.0 g/dL   HCT 38.0 (L) 39.0 - 52.0 %  I-STAT, chem 8     Status: Abnormal   Collection Time: 12/03/17  3:57 PM  Result Value Ref Range   Sodium 141 135 - 145 mmol/L   Potassium 3.6 3.5 - 5.1 mmol/L   Chloride 102 101 - 111 mmol/L   BUN 39 (H) 6 - 20 mg/dL   Creatinine, Ser 2.50 (H) 0.61 - 1.24 mg/dL   Glucose, Bld 98 65 - 99 mg/dL   Calcium, Ion 1.14 (L) 1.15 - 1.40 mmol/L   TCO2 27 22 - 32 mmol/L   Hemoglobin  13.3 13.0 - 17.0 g/dL   HCT 39.0 39.0 - 52.0 %  ECHOCARDIOGRAM LIMITED     Status: None   Collection Time: 12/03/17  4:09 PM  Result Value Ref Range   AV pk vel 116 cm/s   Mean grad 3 mmHg   LVOT diameter 20 mm   LVOT area 3.14 cm2   VTI 23.7 cm   Peak grad 5 mmHg   AO mean calculated velocity dopler 75 cm/s  CBC     Status: Abnormal   Collection Time: 12/03/17  4:34 PM  Result Value Ref Range   WBC 9.5 4.0 - 10.5 K/uL   RBC 5.30 4.22 - 5.81 MIL/uL   Hemoglobin 14.8 13.0 - 17.0 g/dL   HCT 44.7 39.0 - 52.0 %   MCV 84.3 78.0 - 100.0 fL   MCH 27.9 26.0 - 34.0 pg   MCHC 33.1 30.0 - 36.0 g/dL   RDW 14.5 11.5 - 15.5 %   Platelets 127 (L) 150 - 400 K/uL    Comment: Performed at [facility], [place] [address] [place], [place] [address]  Protime-INR     Status: Abnormal   Collection Time: 12/03/17  4:34 PM  Result Value Ref Range   Prothrombin Time 16.1 (H) 11.4 - 15.2 seconds   INR 1.30     Comment: Performed at [facility], [place] [address] [place], [place] [address]  APTT     Status: None   Collection Time: 12/03/17  4:34 PM  Result Value Ref Range   aPTT 36 24 - 36 seconds    Comment: Performed at [facility] [address], [place], [place] [address]  I-STAT 4, (NA,K, GLUC, HGB,HCT)     Status: None   Collection Time: 12/03/17  4:47 PM  Result Value Ref Range   Sodium 145 135 - 145 mmol/L   Potassium 3.7 3.5 - 5.1 mmol/L   Glucose, Bld 99 65 - 99 mg/dL   HCT 43.0 39.0 - 52.0 %   Hemoglobin 14.6 13.0 - 17.0 g/dL  Glucose, capillary     Status: Abnormal   Collection Time: 12/03/17  9:26 PM  Result Value Ref Range   Glucose-Capillary 139 (H) 65 - 99 mg/dL   Comment 1 Capillary Specimen   Glucose, capillary     Status: Abnormal   Collection Time: 12/03/17 11:22 PM  Result Value Ref Range   Glucose-Capillary 174 (H) 65 - 99 mg/dL   Comment 1 Capillary Specimen   Glucose, capillary     Status: Abnormal   Collection Time: 12/04/17  3:28 AM  Result Value  Ref Range   Glucose-Capillary 172 (H) 65 - 99 mg/dL   Comment 1 Capillary Specimen   CBC     Status: Abnormal   Collection Time: 12/04/17  3:59 AM  Result Value Ref Range   WBC 11.3 (H) 4.0 - 10.5 K/uL   RBC 5.04 4.22 - 5.81 MIL/uL   Hemoglobin 13.7 13.0 - 17.0 g/dL   HCT 42.2 39.0 - 52.0 %   MCV 83.7 78.0 - 100.0 fL   MCH 27.2 26.0 - 34.0 pg   MCHC 32.5 30.0 - 36.0 g/dL   RDW 14.1 11.5 - 15.5 %   Platelets 122 (L) 150 - 400 K/uL    Comment: Performed at [facility], [place] [address], [place], [place] [address]  Basic metabolic panel     Status: Abnormal   Collection Time: 12/04/17  3:59 AM  Result Value Ref Range   Sodium 139 135 - 145 mmol/L   Potassium 4.4 3.5 - 5.1 mmol/L   Chloride 105 101 - 111 mmol/L   CO2 22 22 - 32 mmol/L   Glucose, Bld 195 (H) 65 - 99 mg/dL   BUN 44 (H) 6 - 20 mg/dL   Creatinine, Ser 2.59 (H) 0.61 - 1.24 mg/dL   Calcium 8.6 (L) 8.9 - 10.3 mg/dL   GFR calc non Af Amer 22 (L) >60 mL/min   GFR calc Af Amer 26 (L) >60 mL/min    Comment: (NOTE) The eGFR has been calculated using the CKD EPI equation. This calculation has not been validated in all clinical situations. eGFR's persistently <60 mL/min signify possible Chronic Kidney Disease.    Anion gap 12 5 - 15    Comment: Performed at [facility] [address] [place], [place] [address]  Magnesium     Status: None   Collection Time: 12/04/17  3:59 AM  Result Value Ref Range   Magnesium 1.8 1.7 - 2.4 mg/dL    Comment: Performed at [facility] [address] [place], [place] [address]  Glucose, capillary  Status: Abnormal   Collection Time: 12/04/17  7:48 AM  Result Value Ref Range   Glucose-Capillary 173 (H) 65 - 99 mg/dL   Comment 1 Capillary Specimen   ECHOCARDIOGRAM COMPLETE     Status: None   Collection Time: 12/04/17 11:20 AM  Result Value Ref Range   Weight 3,428.59 oz   Height 69 in   BP 162/58 mmHg  Glucose, capillary     Status: Abnormal   Collection Time:  12/04/17 11:24 AM  Result Value Ref Range   Glucose-Capillary 240 (H) 65 - 99 mg/dL   Comment 1 Capillary Specimen   Glucose, capillary     Status: Abnormal   Collection Time: 12/04/17  3:49 PM  Result Value Ref Range   Glucose-Capillary 296 (H) 65 - 99 mg/dL   Comment 1 Capillary Specimen   Glucose, capillary     Status: Abnormal   Collection Time: 12/04/17  8:39 PM  Result Value Ref Range   Glucose-Capillary 322 (H) 65 - 99 mg/dL  Basic metabolic panel     Status: Abnormal   Collection Time: 12/05/17  3:25 AM  Result Value Ref Range   Sodium 137 135 - 145 mmol/L   Potassium 3.8 3.5 - 5.1 mmol/L   Chloride 100 (L) 101 - 111 mmol/L   CO2 24 22 - 32 mmol/L   Glucose, Bld 344 (H) 65 - 99 mg/dL   BUN 51 (H) 6 - 20 mg/dL   Creatinine, Ser 2.92 (H) 0.61 - 1.24 mg/dL   Calcium 8.5 (L) 8.9 - 10.3 mg/dL   GFR calc non Af Amer 19 (L) >60 mL/min   GFR calc Af Amer 22 (L) >60 mL/min    Comment: (NOTE) The eGFR has been calculated using the CKD EPI equation. This calculation has not been validated in all clinical situations. eGFR's persistently <60 mL/min signify possible Chronic Kidney Disease.    Anion gap 13 5 - 15    Comment: Performed at [facility] [address] [place], [place] [address]  Glucose, capillary     Status: Abnormal   Collection Time: 12/05/17  7:54 AM  Result Value Ref Range   Glucose-Capillary 311 (H) 65 - 99 mg/dL  Basic metabolic panel     Status: Abnormal   Collection Time: 12/12/17  3:43 PM  Result Value Ref Range   Glucose 289 (H) 65 - 99 mg/dL   BUN 44 (H) 8 - 27 mg/dL   Creatinine, Ser 3.06 (H) 0.76 - 1.27 mg/dL   GFR calc non Af Amer 19 (L) >59 mL/min/1.73   GFR calc Af Amer 22 (L) >59 mL/min/1.73   BUN/Creatinine Ratio 14 10 - 24   Sodium 140 134 - 144 mmol/L   Potassium 4.2 3.5 - 5.2 mmol/L   Chloride 97 96 - 106 mmol/L   CO2 25 20 - 29 mmol/L   Calcium 8.6 8.6 - 10.2 mg/dL  Glucose, capillary     Status: Abnormal   Collection Time:  01/28/18  6:45 AM  Result Value Ref Range   Glucose-Capillary 127 (H) 65 - 99 mg/dL  Surgical pcr screen     Status: None   Collection Time: 01/28/18  7:24 AM  Result Value Ref Range   MRSA, PCR NEGATIVE NEGATIVE   Staphylococcus aureus NEGATIVE NEGATIVE    Comment: (NOTE) The Xpert SA Assay (FDA approved for NASAL specimens in patients 66 years of age and older), is one component of a comprehensive surveillance program. It is not  intended to diagnose infection nor to guide or monitor treatment. Performed at [facility], [place] [address], [place], [place] [address]   POCT Activated clotting time     Status: None   Collection Time: 01/28/18 12:18 PM  Result Value Ref Range   Activated Clotting Time 285 seconds  Glucose, capillary     Status: None   Collection Time: 01/28/18  1:54 PM  Result Value Ref Range   Glucose-Capillary 88 65 - 99 mg/dL   Comment 1 Notify RN    Comment 2 Document in Chart   Glucose, capillary     Status: Abnormal   Collection Time: 01/28/18  5:09 PM  Result Value Ref Range   Glucose-Capillary 199 (H) 65 - 99 mg/dL   Comment 1 Notify RN    Comment 2 Document in Chart   Glucose, capillary     Status: Abnormal   Collection Time: 01/28/18  9:50 PM  Result Value Ref Range   Glucose-Capillary 234 (H) 65 - 99 mg/dL   Comment 1 Notify RN    Comment 2 Document in Chart   Basic metabolic panel     Status: Abnormal   Collection Time: 01/29/18  3:22 AM  Result Value Ref Range   Sodium 141 135 - 145 mmol/L   Potassium 4.3 3.5 - 5.1 mmol/L   Chloride 106 101 - 111 mmol/L   CO2 25 22 - 32 mmol/L   Glucose, Bld 130 (H) 65 - 99 mg/dL   BUN 35 (H) 6 - 20 mg/dL   Creatinine, Ser 2.47 (H) 0.61 - 1.24 mg/dL   Calcium 8.9 8.9 - 10.3 mg/dL   GFR calc non Af Amer 24 (L) >60 mL/min   GFR calc Af Amer 27 (L) >60 mL/min    Comment: (NOTE) The eGFR has been calculated using the CKD EPI equation. This calculation has not been validated in all clinical  situations. eGFR's persistently <60 mL/min signify possible Chronic Kidney Disease.    Anion gap 10 5 - 15    Comment: Performed at [facility] [address], [place], [place] [address]  Glucose, capillary     Status: Abnormal   Collection Time: 01/29/18  5:54 AM  Result Value Ref Range   Glucose-Capillary 120 (H) 65 - 99 mg/dL   Comment 1 Notify RN    Comment 2 Document in Chart   Glucose, capillary     Status: Abnormal   Collection Time: 01/29/18 11:14 AM  Result Value Ref Range   Glucose-Capillary 143 (H) 65 - 99 mg/dL  POCT HgB A1C     Status: None   Collection Time: 02/06/18  9:44 AM  Result Value Ref Range   Hemoglobin A1C 8.8   Basic metabolic panel     Status: Abnormal   Collection Time: 02/16/18  2:22 PM  Result Value Ref Range   Sodium 134 (L) 135 - 145 mmol/L   Potassium 4.4 3.5 - 5.1 mmol/L   Chloride 98 (L) 101 - 111 mmol/L   CO2 21 (L) 22 - 32 mmol/L   Glucose, Bld 391 (H) 65 - 99 mg/dL   BUN 55 (H) 6 - 20 mg/dL   Creatinine, Ser 3.19 (H) 0.61 - 1.24 mg/dL   Calcium 8.6 (L) 8.9 - 10.3 mg/dL   GFR calc non Af Amer 17 (L) >60 mL/min   GFR calc Af Amer 20 (L) >60 mL/min    Comment: (NOTE) The eGFR has been calculated using the CKD  EPI equation. This calculation has not been validated in all clinical situations. eGFR's persistently <60 mL/min signify possible Chronic Kidney Disease.    Anion gap 15 5 - 15    Comment: Performed at [facility] [address] [place], [place] [address]  CBC     Status: Abnormal   Collection Time: 02/16/18  2:22 PM  Result Value Ref Range   WBC 12.8 (H) 4.0 - 10.5 K/uL   RBC 4.86 4.22 - 5.81 MIL/uL   Hemoglobin 13.0 13.0 - 17.0 g/dL   HCT 39.6 39.0 - 52.0 %   MCV 81.5 78.0 - 100.0 fL   MCH 26.7 26.0 - 34.0 pg   MCHC 32.8 30.0 - 36.0 g/dL   RDW 16.1 (H) 11.5 - 15.5 %   Platelets 170 150 - 400 K/uL    Comment: Performed at [facility] [address] [place], [place] [address]  Procalcitonin -  Baseline     Status: None   Collection Time: 02/16/18  2:22 PM  Result Value Ref Range   Procalcitonin 0.58 ng/mL    Comment:        Interpretation: PCT > 0.5 ng/mL and <= 2 ng/mL: Systemic infection (sepsis) is possible, but other conditions are known to elevate PCT as well. (NOTE)       Sepsis PCT Algorithm           Lower Respiratory Tract                                      Infection PCT Algorithm    ----------------------------     ----------------------------         PCT < 0.25 ng/mL                PCT < 0.10 ng/mL         Strongly encourage             Strongly discourage   discontinuation of antibiotics    initiation of antibiotics    ----------------------------     -----------------------------       PCT 0.25 - 0.50 ng/mL            PCT 0.10 - 0.25 ng/mL               OR       >80% decrease in PCT            Discourage initiation of                                            antibiotics      Encourage discontinuation           of antibiotics    ----------------------------     -----------------------------         PCT >= 0.50 ng/mL              PCT 0.26 - 0.50 ng/mL                AND       <80% decrease in PCT             Encourage initiation of  antibiotics       Encourage continuation           of antibiotics    ----------------------------     -----------------------------        PCT >= 0.50 ng/mL                  PCT > 0.50 ng/mL               AND         increase in PCT                  Strongly encourage                                      initiation of antibiotics    Strongly encourage escalation           of antibiotics                                     -----------------------------                                           PCT <= 0.25 ng/mL                                                 OR                                        > 80% decrease in PCT                                     Discontinue / Do not  initiate                                             antibiotics Performed at [facility], [address], [place], [place] [address]   Urinalysis, Routine w reflex microscopic     Status: Abnormal   Collection Time: 02/16/18  2:50 PM  Result Value Ref Range   Color, Urine YELLOW YELLOW   APPearance HAZY (A) CLEAR   Specific Gravity, Urine 1.015 1.005 - 1.030   pH 5.0 5.0 - 8.0   Glucose, UA >=500 (A) NEGATIVE mg/dL   Hgb urine dipstick LARGE (A) NEGATIVE   Bilirubin Urine NEGATIVE NEGATIVE   Ketones, ur NEGATIVE NEGATIVE mg/dL   Protein, ur 100 (A) NEGATIVE mg/dL   Nitrite NEGATIVE NEGATIVE   Leukocytes, UA NEGATIVE NEGATIVE   RBC / HPF TOO NUMEROUS TO COUNT 0 - 5 RBC/hpf   WBC, UA 0-5 0 - 5 WBC/hpf   Bacteria, UA NONE SEEN NONE SEEN   Squamous Epithelial / LPF 0-5 (A) NONE SEEN   Mucus PRESENT     Comment: Performed at [facility], [place]  [address], [place], [place] [address]  Culture, blood (routine x 2)     Status: None   Collection Time: 02/16/18  4:30 PM  Result Value Ref Range   Specimen Description BLOOD RIGHT ANTECUBITAL    Special Requests      BOTTLES DRAWN AEROBIC AND ANAEROBIC Blood Culture adequate volume   Culture      NO GROWTH 5 DAYS Performed at [facility], [place] [address], [place], [place] [address]    Report Status 02/21/2018 FINAL   Lactic acid, plasma     Status: None   Collection Time: 02/16/18  4:30 PM  Result Value Ref Range   Lactic Acid, Venous 1.8 0.5 - 1.9 mmol/L    Comment: Performed at [facility] [address] [place], [place] [address]  Culture, blood (routine x 2)     Status: None   Collection Time: 02/16/18  5:04 PM  Result Value Ref Range   Specimen Description BLOOD LEFT ANTECUBITAL    Special Requests      BOTTLES DRAWN AEROBIC AND ANAEROBIC Blood Culture adequate volume   Culture      NO GROWTH 5 DAYS Performed at [facility], [place] [address], [place], [place] [address]    Report Status  02/21/2018 FINAL   Influenza panel by PCR (type A & B)     Status: None   Collection Time: 02/16/18  9:19 PM  Result Value Ref Range   Influenza A By PCR NEGATIVE NEGATIVE   Influenza B By PCR NEGATIVE NEGATIVE    Comment: (NOTE) The Xpert Xpress Flu assay is intended as an aid in the diagnosis of  influenza and should not be used as a sole basis for treatment.  This  assay is FDA approved for nasopharyngeal swab specimens only. Nasal  washings and aspirates are unacceptable for Xpert Xpress Flu testing. Performed at [facility], [place] [address], [place], [place] [address]   Glucose, capillary     Status: Abnormal   Collection Time: 02/16/18 10:05 PM  Result Value Ref Range   Glucose-Capillary 358 (H) 65 - 99 mg/dL   Comment 1 Notify RN    Comment 2 Document in Chart   MRSA PCR Screening     Status: None   Collection Time: 02/16/18 11:38 PM  Result Value Ref Range   MRSA by PCR NEGATIVE NEGATIVE    Comment:        The GeneXpert MRSA Assay (FDA approved for NASAL specimens only), is one component of a comprehensive MRSA colonization surveillance program. It is not intended to diagnose MRSA infection nor to guide or monitor treatment for MRSA infections. Performed at [facility], [place] [address], [place], [place] [address]   Strep pneumoniae urinary antigen     Status: None   Collection Time: 02/17/18  1:18 AM  Result Value Ref Range   Strep Pneumo Urinary Antigen NEGATIVE NEGATIVE    Comment:        Infection due to S. pneumoniae cannot be absolutely ruled out since the antigen present may be below the detection limit of the test. Performed at [facility], [address] [place],  [address]   Culture, sputum-assessment     Status: None   Collection Time: 02/17/18  3:55 AM  Result Value Ref Range   Specimen Description SPUTUM    Special Requests NONE    Sputum evaluation      THIS SPECIMEN IS  ACCEPTABLE FOR SPUTUM CULTURE Performed at  [facility], [place] [address] [place], [place] [address]    Report Status 02/17/2018 FINAL   Culture, respiratory (NON-Expectorated)     Status: None   Collection Time: 02/17/18  3:55 AM  Result Value Ref Range   Specimen Description SPUTUM    Special Requests NONE Reflexed from [id number]    Gram Stain      MODERATE WBC PRESENT, PREDOMINANTLY PMN RARE SQUAMOUS EPITHELIAL CELLS PRESENT ABUNDANT GRAM NEGATIVE RODS FEW GRAM NEGATIVE COCCI IN PAIRS    Culture      Consistent with normal respiratory flora. Performed at [facility], [place] [address], [place], [place] [address]    Report Status 02/19/2018 FINAL   CBC     Status: Abnormal   Collection Time: 02/17/18  6:55 AM  Result Value Ref Range   WBC 9.2 4.0 - 10.5 K/uL   RBC 4.54 4.22 - 5.81 MIL/uL   Hemoglobin 12.4 (L) 13.0 - 17.0 g/dL   HCT 36.8 (L) 39.0 - 52.0 %   MCV 81.1 78.0 - 100.0 fL   MCH 27.3 26.0 - 34.0 pg   MCHC 33.7 30.0 - 36.0 g/dL   RDW 16.8 (H) 11.5 - 15.5 %   Platelets 158 150 - 400 K/uL    Comment: Performed at [facility], [place] [address] [place], [place] [address]  Procalcitonin     Status: None   Collection Time: 02/17/18  6:55 AM  Result Value Ref Range   Procalcitonin 1.69 ng/mL    Comment:        Interpretation: PCT > 0.5 ng/mL and <= 2 ng/mL: Systemic infection (sepsis) is possible, but other conditions are known to elevate PCT as well. (NOTE)       Sepsis PCT Algorithm           Lower Respiratory Tract                                      Infection PCT Algorithm    ----------------------------     ----------------------------         PCT < 0.25 ng/mL                PCT < 0.10 ng/mL         Strongly encourage             Strongly discourage   discontinuation of antibiotics    initiation of antibiotics    ----------------------------     -----------------------------       PCT 0.25 - 0.50 ng/mL            PCT 0.10 - 0.25 ng/mL               OR       >80% decrease in PCT             Discourage initiation of                                            antibiotics      Encourage discontinuation           of antibiotics    ----------------------------     -----------------------------         PCT >= 0.50 ng/mL  PCT 0.26 - 0.50 ng/mL                AND       <80% decrease in PCT             Encourage initiation of                                             antibiotics       Encourage continuation           of antibiotics    ----------------------------     -----------------------------        PCT >= 0.50 ng/mL                  PCT > 0.50 ng/mL               AND         increase in PCT                  Strongly encourage                                      initiation of antibiotics    Strongly encourage escalation           of antibiotics                                     -----------------------------                                           PCT <= 0.25 ng/mL                                                 OR                                        > 80% decrease in PCT                                     Discontinue / Do not initiate                                             antibiotics Performed at [facility], [address], [place], [place] [address]   Basic metabolic panel     Status: Abnormal   Collection Time: 02/17/18  6:55 AM  Result Value Ref Range   Sodium 137 135 - 145 mmol/L   Potassium 3.8 3.5 - 5.1 mmol/L   Chloride 102 101 - 111 mmol/L   CO2 23 22 - 32 mmol/L   Glucose, Bld 267 (H) 65 - 99 mg/dL   BUN 60 (  H) 6 - 20 mg/dL   Creatinine, Ser 3.35 (H) 0.61 - 1.24 mg/dL   Calcium 8.6 (L) 8.9 - 10.3 mg/dL   GFR calc non Af Amer 16 (L) >60 mL/min   GFR calc Af Amer 19 (L) >60 mL/min    Comment: (NOTE) The eGFR has been calculated using the CKD EPI equation. This calculation has not been validated in all clinical situations. eGFR's persistently <60 mL/min signify possible Chronic Kidney Disease.    Anion gap 12 5 - 15     Comment: Performed at [facility] [address], [place], [place] [address]  Glucose, capillary     Status: Abnormal   Collection Time: 02/17/18  8:38 AM  Result Value Ref Range   Glucose-Capillary 237 (H) 65 - 99 mg/dL   Comment 1 Notify RN    Comment 2 Document in Chart   Glucose, capillary     Status: Abnormal   Collection Time: 02/17/18 11:58 AM  Result Value Ref Range   Glucose-Capillary 142 (H) 65 - 99 mg/dL   Comment 1 Notify RN    Comment 2 Document in Chart   Glucose, capillary     Status: None   Collection Time: 02/17/18  5:27 PM  Result Value Ref Range   Glucose-Capillary 98 65 - 99 mg/dL   Comment 1 Notify RN    Comment 2 Document in Chart   Glucose, capillary     Status: Abnormal   Collection Time: 02/17/18  9:12 PM  Result Value Ref Range   Glucose-Capillary 60 (L) 65 - 99 mg/dL   Comment 1 Notify RN    Comment 2 Document in Chart   Glucose, capillary     Status: None   Collection Time: 02/17/18 10:13 PM  Result Value Ref Range   Glucose-Capillary 93 65 - 99 mg/dL   Comment 1 Notify RN    Comment 2 Document in Chart   Basic metabolic panel     Status: Abnormal   Collection Time: 02/18/18  5:43 AM  Result Value Ref Range   Sodium 138 135 - 145 mmol/L   Potassium 3.5 3.5 - 5.1 mmol/L   Chloride 103 101 - 111 mmol/L   CO2 23 22 - 32 mmol/L   Glucose, Bld 181 (H) 65 - 99 mg/dL   BUN 63 (H) 6 - 20 mg/dL   Creatinine, Ser 3.72 (H) 0.61 - 1.24 mg/dL   Calcium 8.6 (L) 8.9 - 10.3 mg/dL   GFR calc non Af Amer 14 (L) >60 mL/min   GFR calc Af Amer 17 (L) >60 mL/min    Comment: (NOTE) The eGFR has been calculated using the CKD EPI equation. This calculation has not been validated in all clinical situations. eGFR's persistently <60 mL/min signify possible Chronic Kidney Disease.    Anion gap 12 5 - 15    Comment: Performed at [facility] [address], [place], [place] [address]  Glucose, capillary     Status: Abnormal   Collection Time:  02/18/18  6:25 AM  Result Value Ref Range   Glucose-Capillary 196 (H) 65 - 99 mg/dL  Glucose, capillary     Status: Abnormal   Collection Time: 02/18/18  7:34 AM  Result Value Ref Range   Glucose-Capillary 182 (H) 65 - 99 mg/dL  Glucose, capillary     Status: Abnormal   Collection Time: 02/18/18 11:32 AM  Result Value Ref Range   Glucose-Capillary 183 (H) 65 - 99 mg/dL  Glucose, capillary     Status: Abnormal   Collection Time: 02/18/18  5:45 PM  Result Value Ref Range   Glucose-Capillary 202 (H) 65 - 99 mg/dL  Glucose, capillary     Status: None   Collection Time: 02/18/18 10:36 PM  Result Value Ref Range   Glucose-Capillary 84 65 - 99 mg/dL  Procalcitonin     Status: None   Collection Time: 02/19/18  4:45 AM  Result Value Ref Range   Procalcitonin 1.19 ng/mL    Comment:        Interpretation: PCT > 0.5 ng/mL and <= 2 ng/mL: Systemic infection (sepsis) is possible, but other conditions are known to elevate PCT as well. (NOTE)       Sepsis PCT Algorithm           Lower Respiratory Tract                                      Infection PCT Algorithm    ----------------------------     ----------------------------         PCT < 0.25 ng/mL                PCT < 0.10 ng/mL         Strongly encourage             Strongly discourage   discontinuation of antibiotics    initiation of antibiotics    ----------------------------     -----------------------------       PCT 0.25 - 0.50 ng/mL            PCT 0.10 - 0.25 ng/mL               OR       >80% decrease in PCT            Discourage initiation of                                            antibiotics      Encourage discontinuation           of antibiotics    ----------------------------     -----------------------------         PCT >= 0.50 ng/mL              PCT 0.26 - 0.50 ng/mL                AND       <80% decrease in PCT             Encourage initiation of                                             antibiotics        Encourage continuation           of antibiotics    ----------------------------     -----------------------------        PCT >= 0.50 ng/mL                  PCT > 0.50 ng/mL               AND  increase in PCT                  Strongly encourage                                      initiation of antibiotics    Strongly encourage escalation           of antibiotics                                     -----------------------------                                           PCT <= 0.25 ng/mL                                                 OR                                        > 80% decrease in PCT                                     Discontinue / Do not initiate                                             antibiotics Performed at [facility], [address] [place], [place] [address]   Basic metabolic panel     Status: Abnormal   Collection Time: 02/19/18  4:45 AM  Result Value Ref Range   Sodium 139 135 - 145 mmol/L   Potassium 3.4 (L) 3.5 - 5.1 mmol/L   Chloride 103 101 - 111 mmol/L   CO2 22 22 - 32 mmol/L   Glucose, Bld 106 (H) 65 - 99 mg/dL   BUN 63 (H) 6 - 20 mg/dL   Creatinine, Ser 3.70 (H) 0.61 - 1.24 mg/dL   Calcium 8.5 (L) 8.9 - 10.3 mg/dL   GFR calc non Af Amer 14 (L) >60 mL/min   GFR calc Af Amer 17 (L) >60 mL/min    Comment: (NOTE) The eGFR has been calculated using the CKD EPI equation. This calculation has not been validated in all clinical situations. eGFR's persistently <60 mL/min signify possible Chronic Kidney Disease.    Anion gap 14 5 - 15    Comment: Performed at [facility] [address] [place], [place] [address]  Glucose, capillary     Status: Abnormal   Collection Time: 02/19/18  7:30 AM  Result Value Ref Range   Glucose-Capillary 163 (H) 65 - 99 mg/dL   Comment 1 Notify RN    Comment 2 Document in Chart     Assessment/Plan: Type 2 diabetes mellitus with stage 4 chronic kidney disease, with long-term current use of  insulin (HCC) -Continue  Novolin or 50 units 3 times daily with meals and Humulin in 50 units at bedtime -Patient encouraged to continue checking fingerstick blood sugars at home -Patient encouraged to decrease the amount of mashed potatoes he eats in a week as well as other carbs. -Patient encouraged to increase physical activity and p.o. intake of water. -Continue following with Dr. Ebony Hail, Endocrinology  History of pneumonia -Antibiotics course complete -Encourage patient to increase physical activity as will take him a few weeks to get back to baseline  Essential hypertension -Currently controlled -Continue current medications including Norvasc 10 mg, metoprolol XL and other meds for CHF including bidil 20-37.5 mg TID  Encounter to establish care -We reviewed the PMH, PSH, FH, SH, Meds and Allergies. -We provided refills for any medications we will prescribe as needed. -We addressed current concerns per orders and patient instructions. -We have asked for records for pertinent exams, studies, vaccines and notes from previous providers. -We have advised patient to follow up per instructions below.  Follow-up PRN. Pt was called on 03/04/18 to clarify COPD on problem list.  It appears that pt saw Pulm in the past and there was a question of COPD, but he is not currently requiring any meds and has not had recent PFTs.  Pt does have a former h/o smoking.   Grier Mitts, MD

## 2018-03-04 ENCOUNTER — Encounter: Payer: Self-pay | Admitting: Family Medicine

## 2018-03-05 ENCOUNTER — Ambulatory Visit: Payer: Medicare Other | Admitting: Endocrinology

## 2018-03-05 ENCOUNTER — Telehealth: Payer: Self-pay | Admitting: Endocrinology

## 2018-03-05 ENCOUNTER — Other Ambulatory Visit: Payer: Self-pay

## 2018-03-05 MED ORDER — INSULIN REGULAR HUMAN 100 UNIT/ML IJ SOLN: INTRAMUSCULAR | 11 refills | Status: DC

## 2018-03-05 MED ORDER — INSULIN PEN NEEDLE 31G X 5 MM MISC: 11 refills | Status: DC

## 2018-03-05 MED ORDER — INSULIN NPH (HUMAN) (ISOPHANE) 100 UNIT/ML ~~LOC~~ SUSP
60.0000 [IU] | Freq: Every day | SUBCUTANEOUS | 11 refills | Status: DC
Start: 2018-03-05 — End: 2018-06-20

## 2018-03-05 NOTE — Telephone Encounter (Signed)
I have sent prescription tp patient's pharmacy.

## 2018-03-05 NOTE — Telephone Encounter (Signed)
please call patient: I got your cbg record.  Please increase the insulins to the numbers listed below.  I'll see you next time.

## 2018-03-05 NOTE — Telephone Encounter (Signed)
error 

## 2018-03-05 NOTE — Telephone Encounter (Addendum)
Patient need some insulin needles sent to   [facility], [address]

## 2018-03-05 NOTE — Telephone Encounter (Signed)
I called and reviewed changes with patient. He had no further questions at this time.

## 2018-03-22 ENCOUNTER — Emergency Department (HOSPITAL_COMMUNITY): Payer: Medicare Other

## 2018-03-22 ENCOUNTER — Encounter (HOSPITAL_COMMUNITY): Payer: Self-pay

## 2018-03-22 ENCOUNTER — Emergency Department (HOSPITAL_COMMUNITY)
Admission: EM | Admit: 2018-03-22 | Discharge: 2018-03-22 | Disposition: A | Discharge status: Home / Self Care | Payer: Medicare Other | Attending: Emergency Medicine | Admitting: Emergency Medicine

## 2018-03-22 DIAGNOSIS — Y929 Unspecified place or not applicable: Secondary | ICD-10-CM | POA: Diagnosis not present

## 2018-03-22 DIAGNOSIS — Z87891 Personal history of nicotine dependence: Secondary | ICD-10-CM | POA: Insufficient documentation

## 2018-03-22 DIAGNOSIS — Z7982 Long term (current) use of aspirin: Secondary | ICD-10-CM | POA: Diagnosis not present

## 2018-03-22 DIAGNOSIS — E1122 Type 2 diabetes mellitus with diabetic chronic kidney disease: Secondary | ICD-10-CM | POA: Diagnosis not present

## 2018-03-22 DIAGNOSIS — X500XXA Overexertion from strenuous movement or load, initial encounter: Secondary | ICD-10-CM | POA: Diagnosis not present

## 2018-03-22 DIAGNOSIS — J449 Chronic obstructive pulmonary disease, unspecified: Secondary | ICD-10-CM | POA: Insufficient documentation

## 2018-03-22 DIAGNOSIS — S82831A Other fracture of upper and lower end of right fibula, initial encounter for closed fracture: Secondary | ICD-10-CM | POA: Insufficient documentation

## 2018-03-22 DIAGNOSIS — Z23 Encounter for immunization: Secondary | ICD-10-CM | POA: Insufficient documentation

## 2018-03-22 DIAGNOSIS — Z7901 Long term (current) use of anticoagulants: Secondary | ICD-10-CM | POA: Insufficient documentation

## 2018-03-22 DIAGNOSIS — I5032 Chronic diastolic (congestive) heart failure: Secondary | ICD-10-CM | POA: Insufficient documentation

## 2018-03-22 DIAGNOSIS — I130 Hypertensive heart and chronic kidney disease with heart failure and stage 1 through stage 4 chronic kidney disease, or unspecified chronic kidney disease: Secondary | ICD-10-CM | POA: Insufficient documentation

## 2018-03-22 DIAGNOSIS — I2510 Atherosclerotic heart disease of native coronary artery without angina pectoris: Secondary | ICD-10-CM | POA: Insufficient documentation

## 2018-03-22 DIAGNOSIS — Y999 Unspecified external cause status: Secondary | ICD-10-CM | POA: Insufficient documentation

## 2018-03-22 DIAGNOSIS — S99811A Other specified injuries of right ankle, initial encounter: Secondary | ICD-10-CM | POA: Diagnosis present

## 2018-03-22 DIAGNOSIS — Z79899 Other long term (current) drug therapy: Secondary | ICD-10-CM | POA: Diagnosis not present

## 2018-03-22 DIAGNOSIS — Y9389 Activity, other specified: Secondary | ICD-10-CM | POA: Insufficient documentation

## 2018-03-22 DIAGNOSIS — N184 Chronic kidney disease, stage 4 (severe): Secondary | ICD-10-CM | POA: Diagnosis not present

## 2018-03-22 DIAGNOSIS — Z794 Long term (current) use of insulin: Secondary | ICD-10-CM | POA: Insufficient documentation

## 2018-03-22 DIAGNOSIS — S50311A Abrasion of right elbow, initial encounter: Secondary | ICD-10-CM

## 2018-03-22 MED ORDER — HYDROCODONE-ACETAMINOPHEN 5-325 MG PO TABS
1.0000 | ORAL_TABLET | Freq: Once | ORAL | Status: AC
  Administered 2018-03-22: 1 via ORAL
  Filled 2018-03-22: qty 1

## 2018-03-22 MED ORDER — TETANUS-DIPHTH-ACELL PERTUSSIS 5-2.5-18.5 LF-MCG/0.5 IM SUSP
0.5000 mL | Freq: Once | INTRAMUSCULAR | Status: AC
  Administered 2018-03-22: 0.5 mL via INTRAMUSCULAR
  Filled 2018-03-22: qty 0.5

## 2018-03-22 MED ORDER — HYDROCODONE-ACETAMINOPHEN 5-325 MG PO TABS: 1.0000 | ORAL_TABLET | Freq: Four times a day (QID) | ORAL | 0 refills | Status: DC | PRN

## 2018-03-22 NOTE — ED Notes (Signed)
Paged ortho regarding pt's need for a cam walker boot

## 2018-03-22 NOTE — ED Provider Notes (Signed)
[facility] EMERGENCY DEPARTMENT Provider Note   CSN: [id number] Arrival date & time: 03/22/18  5102     History   Chief Complaint Chief Complaint  Patient presents with  . Ankle Pain    HPI Cameron [name] is a 77 y.o. male history of CAD status post CABG on xarelto, hypertension, hyperlipidemia, here presenting with fall.  Patient states that he was at the picnic table and his right foot got entangled and he twisted his ankle and felt on the right buttock yesterday.  He states that his hip does not hurt but his right ankle is hurting.  He states that he was able to bear weight on it but has pain when he bears weight.  Patient states that he also skinned his right elbow as well have left lower leg as well but states that his right elbow and his left leg does not hurt.  Patient is on xarelto. Unknown tdap.   The history is provided by the patient.    Past Medical History:  Diagnosis Date  . CAD (coronary artery disease)    a. 2002: s/p CABG x 4V (LIMA-LAD, seq SVG-PDA, PLA, seq SVG-OM1, OM2, SVG-Diag1)  . CKD (chronic kidney disease)   . COPD (chronic obstructive pulmonary disease) ([place])   . Diabetes mellitus without complication ([place])   . HLD (hyperlipidemia)   . Hypertension   . Obesity   . PAF (paroxysmal atrial fibrillation) ([place])   . RBBB   . 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 Severe aortic stenosis     Patient Active Problem List   Diagnosis Date Noted  . Acute respiratory failure ([place]) 02/16/2018  . HCAP (healthcare-associated pneumonia) 02/16/2018  . Sepsis ([place]) 02/16/2018  . Status post percutaneous transluminal angioplasty (PTA) 01/28/2018  . Severe claudication ([place]) 01/27/2018  . Abnormal ankle brachial index (ABI) 01/27/2018  . Diabetes ([place]) 01/25/2018  . Chronic diastolic heart failure ([place]) 12/03/2017  . Severe aortic stenosis  12/03/2017  . CKD (chronic kidney disease) stage 4, GFR 15-29 ml/min (H[facility]) 12/03/2017  . 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 Obesity   . COPD (chronic obstructive pulmonary disease) ([place])   . CKD (chronic kidney disease)   . CAD (coronary artery disease)   . PAF (paroxysmal atrial fibrillation) ([place])   . RBBB     Past Surgical History:  Procedure Laterality Date  . ABDOMINAL AORTAGRAM Right 01/28/2018   LOWER EXTREMITY  . ABDOMINAL AORTOGRAM W/LOWER EXTREMITY N/A 01/28/2018   Procedure: ABDOMINAL AORTOGRAM W/LOWER EXTREMITY [place];  Surgeon: Nigel Mormon, MD;  Location: [facility];  Service: Cardiovascular;  Laterality: N/A;  . CARDIAC SURGERY    . CORONARY BALLOON ANGIOPLASTY N/A 09/23/2017   Procedure: CORONARY BALLOON ANGIOPLASTY;  Surgeon: Nigel Mormon, MD;  Location: [facility];  Service: Cardiovascular;  Laterality: N/A;  . FINGER AMPUTATION    . HERNIA REPAIR    . open heart surgery    . PERIPHERAL VASCULAR BALLOON ANGIOPLASTY  01/28/2018   Procedure: PERIPHERAL VASCULAR BALLOON ANGIOPLASTY;  Surgeon: Nigel Mormon, MD;  Location: [facility];  Service: Cardiovascular;;  Rt SFA  . RIGHT HEART CATH AND CORONARY/GRAFT ANGIOGRAPHY N/A 09/17/2017   Procedure: RIGHT HEART CATH AND CORONARY/GRAFT ANGIOGRAPHY;  Surgeon: Nigel Mormon, MD;  Location: [facility];  Service: Cardiovascular;  Laterality: N/A;  . TEE  WITHOUT CARDIOVERSION N/A 12/03/2017   Procedure: TRANSESOPHAGEAL ECHOCARDIOGRAM (TEE);  Surgeon: Sherren Mocha, MD;  Location: [facility];  Service: Open Heart Surgery;  Laterality: N/A;  . TOE AMPUTATION    . TRANSCATHETER AORTIC VALVE REPLACEMENT, TRANSFEMORAL N/A 12/03/2017   Procedure: TRANSCATHETER AORTIC VALVE REPLACEMENT, TRANSFEMORAL;  Surgeon: Sherren Mocha, MD;  Location: [facility];  Service: Open Heart Surgery;  Laterality: N/A;  . ULTRASOUND GUIDANCE FOR VASCULAR ACCESS  09/17/2017    Procedure: Ultrasound Guidance For Vascular Access;  Surgeon: Nigel Mormon, MD;  Location: [facility];  Service: Cardiovascular;;        Home Medications    Prior to Admission medications   Medication Sig Start Date End Date Taking? Authorizing Provider  amLODipine (NORVASC) 10 MG tablet Take 10 mg by mouth daily.    [provider]  ammonium lactate (AMLACTIN) 12 % lotion Apply 1 application topically as needed for dry skin. Recommend twice daily until dry skin improved 01/21/18   Landis Martins, DPM  amoxicillin (AMOXIL) 500 MG capsule Take 4 tablets (2g total) one hour prior to dental appointment Patient taking differently: Take 2,000 mg by mouth See admin instructions. Take 4 tablets (2g total) one hour prior to dental appointment 12/12/17   Eileen Stanford, PA-C  aspirin EC 81 MG EC tablet Take 1 tablet (81 mg total) by mouth daily. 12/06/17   Eileen Stanford, PA-C  citalopram (CELEXA) 10 MG tablet Take 1 tablet (10 mg total) by mouth daily. 09/25/17   Patwardhan, Reynold Bowen, MD  furosemide (LASIX) 40 MG tablet Take 1 tablet (40 mg total) by mouth 2 (two) times daily. 12/27/17 12/22/18  Sherren Mocha, MD  glucose blood [facility] [address] VERIO) test strip 1 each by Other route 2 (two) times daily. And lancets 2/day 01/23/18   Renato Shin, MD  insulin NPH Human (HUMULIN N,NOVOLIN N) 100 UNIT/ML injection Inject 0.6 mLs (60 Units total) into the skin at bedtime. 03/05/18   Renato Shin, MD  Insulin Pen Needle 31G X 5 MM MISC Used to give insulin injections 4 times a day. 03/05/18   Renato Shin, MD  insulin regular (NOVOLIN R,HUMULIN R) 100 units/mL injection 3 times a day (just before each meal), 55-35-55 units, and syringes 4/day 03/05/18   Renato Shin, MD  isosorbide mononitrate (IMDUR) 60 MG 24 hr tablet Take 60 mg by mouth daily.    [provider]  metoprolol succinate (TOPROL-XL) 50 MG 24 hr tablet Take 50 mg by mouth daily. Take with or immediately following  a meal.    [provider]  nitroGLYCERIN (NITROSTAT) 0.4 MG SL tablet Place 0.4 mg every 5 (five) minutes as needed under the tongue for chest pain.    [provider]  Rivaroxaban (XARELTO) 15 MG TABS tablet Take 1 tablet (15 mg total) by mouth daily with supper. 09/25/17   Patwardhan, Reynold Bowen, MD  rosuvastatin (CRESTOR) 40 MG tablet Take 40 mg daily after supper by mouth.     [provider]    Family History Family History  Problem Relation Age of Onset  . Diabetes Mother   . Heart disease Father   . Stomach cancer Paternal Grandfather   . Lung cancer Maternal Grandmother        smoked    Social History Social History   Tobacco Use  . Smoking status: Former Smoker    Packs/day: 2.00    Years: 20.00    Pack years: 40.00    Types: Cigarettes  Last attempt to quit: 09/28/1978    Years since quitting: 39.5  . Smokeless tobacco: Never Used  Substance Use Topics  . Alcohol use: No  . Drug use: No     Allergies   Cefuroxime and Ezetimibe   Review of Systems Review of Systems  Musculoskeletal:       R ankle pain   Skin: Positive for wound.  All other systems reviewed and are negative.    Physical Exam Updated Vital Signs BP (!) 160/61 (BP Location: Left Arm)   Pulse 80   Temp 98 F (36.7 C) (Oral)   Resp 16   Ht 5\' 9"  (1.753 m)   Wt 93 kg (205 lb)   SpO2 94%   BMI 30.27 kg/m   Physical Exam  Constitutional: He appears well-developed and well-nourished.  HENT:  Head: Normocephalic and atraumatic.  No scalp hematoma   Eyes: Pupils are equal, round, and reactive to light. EOM are normal.  Neck: Normal range of motion. Neck supple.  Cardiovascular: Normal rate, regular rhythm and normal heart sounds.  Pulmonary/Chest: Effort normal and breath sounds normal. No stridor. No respiratory distress. He has no wheezes.  Abdominal: Soft. Bowel sounds are normal. He exhibits no distension. There is no tenderness.  Musculoskeletal:    Skin tear R elbow nl ROM of the elbow with no bony tenderness. Skin tear L lower leg with no active bleeding or bony tenderness. R ankle swollen and tender, no tenderness of the knee or hip.   Skin: Skin is warm.  Psychiatric: He has a normal mood and affect.  Nursing note and vitals reviewed.    ED Treatments / Results  Labs (all labs ordered are listed, but only abnormal results are displayed) Labs Reviewed - No data to display  EKG None  Radiology Dg Tibia/fibula Right  Result Date: 03/22/2018 CLINICAL DATA:  Right lower leg pain after fall yesterday. EXAM: RIGHT TIBIA AND FIBULA - 2 VIEW COMPARISON:  None. FINDINGS: Nondisplaced oblique fracture is seen involving the distal right fibula. No other bony abnormality is noted. Joint spaces are intact. IMPRESSION: Nondisplaced distal right fibular fracture. Electronically Signed   By: Marijo Conception, M.D.   On: 03/22/2018 10:21   Dg Ankle Complete Right  Result Date: 03/22/2018 CLINICAL DATA:  Acute right ankle pain after fall. EXAM: RIGHT ANKLE - COMPLETE 3+ VIEW COMPARISON:  None. FINDINGS: Nondisplaced oblique fracture is seen involving the distal right fibula. Joint spaces are intact. Surgical clips are seen in the soft tissues. IMPRESSION: Nondisplaced distal right fibular fracture. Electronically Signed   By: Marijo Conception, M.D.   On: 03/22/2018 10:19   Dg Foot Complete Right  Result Date: 03/22/2018 CLINICAL DATA:  Right ankle pain after fall yesterday. EXAM: RIGHT FOOT COMPLETE - 3+ VIEW COMPARISON:  None. 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 No fracture or dislocation is noted. Minimal spurring of posterior calcaneus is noted. Soft tissues are unremarkable. IMPRESSION: No acute abnormality seen in the right foot. Electronically Signed   By: Marijo Conception, M.D.   On: 03/22/2018 10:23    Procedures Procedures (including critical care time)  The wound is cleansed, debrided of foreign  material as much as possible, and dressed. The patient is alerted to watch for any signs of infection (redness, pus, pain, increased swelling or fever) and call if such occurs. Home wound care instructions are provided. Tetanus vaccination status reviewed: Td vaccination indicated and given today. We  applied bacitracin, nonstick dressing, wrapped with gauze.    Medications Ordered in ED Medications - No data to display   Initial Impression / Assessment and Plan / ED Course  I have reviewed the triage vital signs and the nursing notes.  Pertinent labs & imaging results that were available during my care of the patient were reviewed by me and considered in my medical decision making (see chart for details).    Cameron [name] is a 77 y.o. male here with ankle pain, skin tears after fall yesterday. He is on xarelto and bleeding controlled. Will get xrays, clean the wounds, update tdap.   10:59 AM Xray showed nondisplaced R distal fibula fracture. He is using walker at baseline. There is no tibial fracture. Will give cam walker, vicodin for pain. Will have him follow up with ortho.    Final Clinical Impressions(s) / ED Diagnoses   Final diagnoses:  None    ED Discharge Orders    None       Drenda Freeze, MD 03/22/18 1100

## 2018-03-22 NOTE — Discharge Instructions (Signed)
Take tylenol for pain.,   Take vicodin for severe pain.   Use walking boot and your walker.   See Dr. Stann Mainland for follow up xray in a week  Return to ER if you have severe ankle pain, unable to walk, toes turning blue.

## 2018-03-22 NOTE — Progress Notes (Signed)
Orthopedic Tech Progress Note Patient Details:  Cameron [name] Dec 22, 1940 [id number]  Ortho Devices Type of Ortho Device: CAM walker Ortho Device/Splint Location: rle Ortho Device/Splint Interventions: Application   Post Interventions Patient Tolerated: Well Instructions Provided: Care of device   [name] 03/22/2018, 12:52 PM

## 2018-03-22 NOTE — ED Triage Notes (Signed)
Pt presents for evaluation of R ankle pain since yesterday. Pt reports mechanical fall while trying to get up from a picnic table yesterday. Pt denies LOC. Reports is able to bear minimal weight with walker but is painful. On bearing weight to go to restroom.

## 2018-03-31 ENCOUNTER — Encounter: Payer: Self-pay | Admitting: Endocrinology

## 2018-03-31 ENCOUNTER — Ambulatory Visit (INDEPENDENT_AMBULATORY_CARE_PROVIDER_SITE_OTHER): Payer: Medicare Other | Admitting: Endocrinology

## 2018-03-31 VITALS — BP 142/64 | HR 80 | Wt 212.0 lb

## 2018-03-31 DIAGNOSIS — N184 Chronic kidney disease, stage 4 (severe): Secondary | ICD-10-CM | POA: Diagnosis not present

## 2018-03-31 DIAGNOSIS — E1122 Type 2 diabetes mellitus with diabetic chronic kidney disease: Secondary | ICD-10-CM | POA: Diagnosis not present

## 2018-03-31 DIAGNOSIS — Z794 Long term (current) use of insulin: Secondary | ICD-10-CM

## 2018-03-31 LAB — POCT GLYCOSYLATED HEMOGLOBIN (HGB A1C): Hemoglobin A1C: 8.2 % — AB (ref 4.0–5.6)

## 2018-03-31 MED ORDER — INSULIN REGULAR HUMAN 100 UNIT/ML IJ SOLN: INTRAMUSCULAR | 11 refills | Status: DC

## 2018-03-31 NOTE — Patient Instructions (Addendum)
check your blood sugar twice a day.  vary the time of day when you check, between before the 3 meals, and at bedtime.  also check if you have symptoms of your blood sugar being too high or too low.  please keep a record of the readings and bring it to your next appointment here (or you can bring the meter itself).  You can write it on any piece of paper.  please call us sooner if your blood sugar goes below 70, or if you have a lot of readings over 200.   Please increase the insulins to the numbers listed below.   Please call or message [place] next week, to tell us how the blood sugar is doing.   Please come back for a follow-up appointment in 2 months.

## 2018-03-31 NOTE — Progress Notes (Signed)
Subjective:    Patient ID: Cameron [name], male    DOB: Sep 28, 1941, 77 y.o.   MRN: [id number]  HPI Pt returns for f/u of diabetes mellitus: DM type: Insulin-requiring type 2 Dx'ed: 0626 Complications: renal failure, PAD, and CAD Therapy: insulin since 2010 DKA: never Severe hypoglycemia: never Pancreatitis: never Pancreatic imaging: normal on 2019 CT Other: he takes multiple daily injections; he takes human insulin, due to cost Interval history: no cbg record, but states cbg's vary from 100-247.  It is in general highest at HS.  pt states he feels well in general.   Past Medical History:  Diagnosis Date  . CAD (coronary artery disease)    a. 2002: s/p CABG x 4V (LIMA-LAD, seq SVG-PDA, PLA, seq SVG-OM1, OM2, SVG-Diag1)  . CKD (chronic kidney disease)   . COPD (chronic obstructive pulmonary disease) ([place])   . Diabetes mellitus without complication ([place])   . HLD (hyperlipidemia)   . Hypertension   . Obesity   . PAF (paroxysmal atrial fibrillation) ([place])   . RBBB   . 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 Severe aortic stenosis     Past Surgical History:  Procedure Laterality Date  . ABDOMINAL AORTAGRAM Right 01/28/2018   LOWER EXTREMITY  . ABDOMINAL AORTOGRAM W/LOWER EXTREMITY N/A 01/28/2018   Procedure: ABDOMINAL AORTOGRAM W/LOWER EXTREMITY [place];  Surgeon: Nigel Mormon, MD;  Location: [facility];  Service: Cardiovascular;  Laterality: N/A;  . CARDIAC SURGERY    . CORONARY BALLOON ANGIOPLASTY N/A 09/23/2017   Procedure: CORONARY BALLOON ANGIOPLASTY;  Surgeon: Nigel Mormon, MD;  Location: [facility];  Service: Cardiovascular;  Laterality: N/A;  . FINGER AMPUTATION    . HERNIA REPAIR    . open heart surgery    . PERIPHERAL VASCULAR BALLOON ANGIOPLASTY  01/28/2018   Procedure: PERIPHERAL VASCULAR BALLOON ANGIOPLASTY;  Surgeon: Nigel Mormon, MD;  Location: [facility];  Service: Cardiovascular;;  Rt SFA  . RIGHT HEART CATH AND CORONARY/GRAFT ANGIOGRAPHY N/A 09/17/2017   Procedure: RIGHT HEART CATH AND CORONARY/GRAFT ANGIOGRAPHY;  Surgeon: Nigel Mormon, MD;  Location: [facility];  Service: Cardiovascular;  Laterality: N/A;  . TEE WITHOUT CARDIOVERSION N/A 12/03/2017   Procedure: TRANSESOPHAGEAL ECHOCARDIOGRAM (TEE);  Surgeon: Sherren Mocha, MD;  Location: [facility];  Service: Open Heart Surgery;  Laterality: N/A;  . TOE AMPUTATION    . TRANSCATHETER AORTIC VALVE REPLACEMENT, TRANSFEMORAL N/A 12/03/2017   Procedure: TRANSCATHETER AORTIC VALVE REPLACEMENT, TRANSFEMORAL;  Surgeon: Sherren Mocha, MD;  Location: [facility];  Service: Open Heart Surgery;  Laterality: N/A;  . ULTRASOUND GUIDANCE FOR VASCULAR ACCESS  09/17/2017   Procedure: Ultrasound Guidance For Vascular Access;  Surgeon: Nigel Mormon, MD;  Location: [facility];  Service: Cardiovascular;;    Social History   Socioeconomic History  . Marital status: Married    Spouse name: Not on file  . Number of children: 2  . Years of education: Not on file  . Highest education level: Not on file  Occupational History  . Occupation: Doctor, general practice  Social Needs  . Financial resource strain: Not on file  . Food insecurity:    Worry: Not on file    Inability: Not on file  . Transportation needs:    Medical: Not on file    Non-medical: Not on file  Tobacco Use  . Smoking status: Former  Smoker    Packs/day: 2.00    Years: 20.00    Pack years: 40.00    Types: Cigarettes    Last attempt to quit: 09/28/1978    Years since quitting: 39.5  . Smokeless tobacco: Never Used  Substance and Sexual Activity  . Alcohol use: No  . Drug use: No  . Sexual activity: Not on file  Lifestyle  . Physical activity:    Days per week: Not on file    Minutes per session: Not on file  . Stress: Not on file  Relationships  . Social  connections:    Talks on phone: Not on file    Gets together: Not on file    Attends religious service: Not on file    Active member of club or organization: Not on file    Attends meetings of clubs or organizations: Not on file    Relationship status: Not on file  . Intimate partner violence:    Fear of current or ex partner: Not on file    Emotionally abused: Not on file    Physically abused: Not on file    Forced sexual activity: Not on file  Other Topics Concern  . Not on file  Social History Narrative  . Not on file    Current Outpatient Medications on File Prior to Visit  Medication Sig Dispense Refill  . amLODipine (NORVASC) 10 MG tablet Take 10 mg by mouth daily.    Marland Kitchen ammonium lactate (AMLACTIN) 12 % lotion Apply 1 application topically as needed for dry skin. Recommend twice daily until dry skin improved 500 g 0  . amoxicillin (AMOXIL) 500 MG capsule Take 4 tablets (2g total) one hour prior to dental appointment (Patient taking differently: Take 2,000 mg by mouth See admin instructions. Take 4 tablets (2g total) one hour prior to dental appointment) 4 capsule 12  . aspirin EC 81 MG EC tablet Take 1 tablet (81 mg total) by mouth daily.    . citalopram (CELEXA) 10 MG tablet Take 1 tablet (10 mg total) by mouth daily. 60 tablet 3  . furosemide (LASIX) 40 MG tablet Take 1 tablet (40 mg total) by mouth 2 (two) times daily. 60 tablet 11  . glucose blood (ONETOUCH VERIO) test strip 1 each by Other route 2 (two) times daily. And lancets 2/day 100 each 12  . HYDROcodone-acetaminophen (NORCO/VICODIN) 5-325 MG tablet Take 1 tablet by mouth every 6 (six) hours as needed. 6 tablet 0  . insulin NPH Human (HUMULIN N,NOVOLIN N) 100 UNIT/ML injection Inject 0.6 mLs (60 Units total) into the skin at bedtime. 20 mL 11  . Insulin Pen Needle 31G X 5 MM MISC Used to give insulin injections 4 times a day. 200 each 11  . isosorbide mononitrate (IMDUR) 60 MG 24 hr tablet Take 60 mg by mouth daily.      . metoprolol succinate (TOPROL-XL) 50 MG 24 hr tablet Take 50 mg by mouth daily. Take with or immediately following a meal.    . nitroGLYCERIN (NITROSTAT) 0.4 MG SL tablet Place 0.4 mg every 5 (five) minutes as needed under the tongue for chest pain.    . Rivaroxaban (XARELTO) 15 MG TABS tablet Take 1 tablet (15 mg total) by mouth daily with supper. 60 tablet 3  . rosuvastatin (CRESTOR) 40 MG tablet Take 40 mg daily after supper by mouth.      No current facility-administered medications on file prior to visit.     Allergies  Allergen  Reactions  . Cefuroxime Diarrhea  . Ezetimibe Diarrhea    Family History  Problem Relation Age of Onset  . Diabetes Mother   . Heart disease Father   . Stomach cancer Paternal Grandfather   . Lung cancer Maternal Grandmother        smoked    BP (!) 142/64   Pulse 80   Wt 212 lb (96.2 kg)   SpO2 96%   BMI 31.31 kg/m    Review of Systems He denies hypoglycemia.     Objective:   Physical Exam VITAL SIGNS:  See vs page GENERAL: no distress LLE: no deformity.  no ulcer, but the skin is dry.  normal color and temp.  There is onychomycosis of the toenails.  CV: 1+ left leg edema.     PULSES: left dorsalis pedis is intact.  NEURO:  sensation is intact to touch on the left foot.  SKIN: no ulcer at the left foot.  Right leg and foot are in a brace.    Lab Results  Component Value Date   HGBA1C 8.2 (A) 03/31/2018       Assessment & Plan:  Insulin-requiring type 2 DM, with PAD: he needs increased rx.  Renal failure: he needs to increase the reg, rather than the NPH.   Patient Instructions  check your blood sugar twice a day.  vary the time of day when you check, between before the 3 meals, and at bedtime.  also check if you have symptoms of your blood sugar being too high or too low.  please keep a record of the readings and bring it to your next appointment here (or you can bring the meter itself).  You can write it on any piece of paper.   please call us sooner if your blood sugar goes below 70, or if you have a lot of readings over 200.   Please increase the insulins to the numbers listed below.   Please call or message [place] next week, to tell us how the blood sugar is doing.   Please come back for a follow-up appointment in 2 months.

## 2018-04-01 ENCOUNTER — Ambulatory Visit (INDEPENDENT_AMBULATORY_CARE_PROVIDER_SITE_OTHER): Payer: Medicare Other | Admitting: Family Medicine

## 2018-04-01 ENCOUNTER — Encounter: Payer: Self-pay | Admitting: Family Medicine

## 2018-04-01 VITALS — BP 120/68 | HR 84 | Temp 97.9°F | Wt 215.0 lb

## 2018-04-01 DIAGNOSIS — S82831A Other fracture of upper and lower end of right fibula, initial encounter for closed fracture: Secondary | ICD-10-CM | POA: Diagnosis not present

## 2018-04-01 DIAGNOSIS — L219 Seborrheic dermatitis, unspecified: Secondary | ICD-10-CM | POA: Diagnosis not present

## 2018-04-01 DIAGNOSIS — T148XXA Other injury of unspecified body region, initial encounter: Secondary | ICD-10-CM | POA: Diagnosis not present

## 2018-04-01 MED ORDER — TRIAMCINOLONE ACETONIDE 0.025 % EX OINT: 1.0000 "application " | TOPICAL_OINTMENT | Freq: Two times a day (BID) | CUTANEOUS | 0 refills | Status: DC

## 2018-04-01 NOTE — Progress Notes (Signed)
Subjective:    Patient ID: Cameron [name], male    DOB: Feb 15, 1941, 77 y.o.   MRN: [id number]  No chief complaint on file.   HPI Patient was seen today for follow-up.  Patient was seen on 5/25 in the ED s/p a fall where he suffered a nondisplaced right distal fibula fracture.  Patient is currently in a cam boot.  He was given a limited supply of Vicodin in the ED. patient states he only took the medication at night to help him sleep.  Since then patient states he has been wearing a cam boot and is set to follow-up with Ortho at the end of the week.  Patient has occasional pain throughout the day for which he has not taken anything.  Pt also suffered a scrape on his left posterior leg/ankle.  Pt states the area started to bleed last night as he rubbed it against his recliner.  Pt is on Xarelto and aspirin 81 mg daily for his heart history.   Pt also notes itchy scalp.  In the past there have been several areas that will bleed.  Past Medical History:  Diagnosis Date  . CAD (coronary artery disease)    a. 2002: s/p CABG x 4V (LIMA-LAD, seq SVG-PDA, PLA, seq SVG-OM1, OM2, SVG-Diag1)  . CKD (chronic kidney disease)   . COPD (chronic obstructive pulmonary disease) ([place])   . Diabetes mellitus without complication ([place])   . HLD (hyperlipidemia)   . Hypertension   . Obesity   . PAF (paroxysmal atrial fibrillation) ([place])   . RBBB   . 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 Severe aortic stenosis     Allergies  Allergen Reactions  . Cefuroxime Diarrhea  . Ezetimibe Diarrhea    ROS General: Denies fever, chills, night sweats, changes in weight, changes in appetite HEENT: Denies headaches, ear pain, changes in vision, rhinorrhea, sore throat  +itchy scalp CV: Denies CP, palpitations, SOB, orthopnea Pulm: Denies SOB, cough, wheezing GI: Denies abdominal pain, nausea, vomiting, diarrhea,  constipation GU: Denies dysuria, hematuria, frequency, vaginal discharge Msk: Denies muscle cramps, joint pains  + right distal fibula fracture Neuro: Denies weakness, numbness, tingling Skin: Denies rashes, bruising  +abrasion of L posterior ankle Psych: Denies depression, anxiety, hallucinations     Objective:    Blood pressure 120/68, pulse 84, temperature 97.9 F (36.6 C), temperature source Oral, weight 215 lb (97.5 kg), SpO2 96 %.   Gen. Pleasant, well-nourished, in no distress, normal affect   HEENT: [place]/AT, face symmetric, no scleral icterus, PERRLA,  nares patent without drainage Lungs: no accessory muscle use Cardiovascular: RRR, no m/r/g, no peripheral edema Musculoskeletal: No deformities, no cyanosis or clubbing, normal tone.  Wearing a cam boot on right foot/LE Neuro:  A&Ox3, CN II-XII intact, normal gait Skin:  Warm, dry.  Area on left posterior ankle/leg with eschar in place, mild erythema surrounding but no warmth or induration.  No drainage noted.  Area dressed in clinic with Tefla and kerlex.     Wt Readings from Last 3 Encounters:  04/01/18 215 lb (97.5 kg)  03/31/18 212 lb (96.2 kg)  03/22/18 205 lb (93 kg)    Lab Results  Component Value Date   WBC 9.2 02/17/2018   HGB 12.4 (L) 02/17/2018   HCT 36.8 (L) 02/17/2018   PLT 158 02/17/2018   GLUCOSE 106 (H) 02/19/2018   CHOL 135 09/15/2017  TRIG 126 09/20/2017   HDL 28 (L) 09/15/2017   LDLCALC 73 09/15/2017   ALT 18 11/29/2017   AST 20 11/29/2017   NA 139 02/19/2018   K 3.4 (L) 02/19/2018   CL 103 02/19/2018   CREATININE 3.70 (H) 02/19/2018   BUN 63 (H) 02/19/2018   CO2 22 02/19/2018   TSH 3.159 09/14/2017   INR 1.30 12/03/2017   HGBA1C 8.2 (A) 03/31/2018    Assessment/Plan:  Nondisplaced fracture of distal end of right fibula -continue wearing CAM boot. -pt advised likely to take 6-8 wks to heal -advised to keep f/u with Ortho -pt amenable to trying Tylenol for pain/discomfort.  If not  working advised to notify clinic.  Seborrheic dermatitis of scalp  - Plan: triamcinolone (KENALOG) 0.025 % ointment  Abrasion -L leg dressed with tefla and kerlex -pt advised to prevent irritation to the area -ok to leave open to the air when able -pt to monitor area for increased drainage, erythema or warmth. -pt to RTC or ED for s/s of infection  F/u prn  Grier Mitts, MD

## 2018-04-01 NOTE — Patient Instructions (Addendum)
Tibial and Fibular Fracture, Adult Tibial and fibular fracture is a break in the bones of your lower leg (tibia and fibula). The tibia is the larger of these two bones. The fibula is the smaller of the two bones. It is on the outer side of your leg. What are the causes?  Low-energy injuries, such as a fall from ground level.  High-energy injuries, such as motor vehicle injuries, gunshot wounds, or high-speed sports collisions. What increases the risk?  Jumping activities.  Repetitive stress, such as long-distance running.  Participation in sports.  Osteoporosis.  Advanced age. What are the signs or symptoms?  Pain.  Swelling.  Inability to put weight on your injured leg.  Bone deformities at the site of your injury.  Bruising. How is this diagnosed? Tibial and fibular fractures are diagnosed with the use of X-ray exams. How is this treated? If you have a simple fracture of these two bones, they can be treated with simple immobilization. A cast or splint will be used on your leg to keep it from moving while it heals. Then you can begin range-of-motion exercises to regain your knee motion. Follow these instructions at home:  Apply ice to your leg: ? Put ice in a plastic bag. ? Place a towel between your skin and the bag. ? Leave the ice on for 20 minutes, 2-3 times a day.  If you have a plaster or fiberglass cast: ? Do not try to scratch the skin under the cast using sharp or pointed objects. ? Check the skin around the cast every day. You may put lotion on any red or sore areas. ? Keep your cast dry and clean.  If you have a plaster splint: ? Wear the splint as directed. ? You may loosen the elastic around the splint if your toes become numb, tingle, or turn cold or blue.  Do not put pressure on any part of your cast or splint until it is fully hardened, because it may deform.  Your cast or splint can be protected during bathing with a plastic bag. Do not lower the  cast or splint into water.  Use crutches as directed.  Only take over-the-counter or prescription medicines for pain, discomfort, or fever as directed by your health care provider.  Follow all instructions given to you by your health care provider.  Make and keep all follow-up appointments. Contact a health care provider if:  Your pain is becoming worse rather than better or is not controlled with medicines.  You have increased swelling or redness in the foot.  You begin to lose feeling in your foot or toes. Get help right away if:  You develop a cold or blue foot or toes on the injured side.  You develop severe pain in your injured leg, especially if the pain is increased with movement of your toes. This information is not intended to replace advice given to you by your health care provider. Make sure you discuss any questions you have with your health care provider. Document Released: 07/07/2002 Document Revised: 03/22/2016 Document Reviewed: 05/27/2013 Elsevier Interactive Patient Education  2018 [employer]. Seborrheic Dermatitis, Adult Seborrheic dermatitis is a skin disease that causes red, scaly patches. It usually occurs on the scalp, and it is often called dandruff. The patches may appear on other parts of the body. Skin patches tend to appear where there are many oil glands in the skin. Areas of the body that are commonly affected include:  Scalp.  Skin folds  of the body.  Ears.  Eyebrows.  Neck.  Face.  Armpits.  The bearded area of men's faces.  The condition may come and go for no known reason, and it is often long-lasting (chronic). What are the causes? The cause of this condition is not known. What increases the risk? This condition is more likely to develop in people who:  Have certain conditions, such as: ? HIV (human immunodeficiency virus). ? AIDS (acquired immunodeficiency syndrome). ? Parkinson disease. ? Mood disorders, such as  depression.  Are 22-20 years old.  What are the signs or symptoms? Symptoms of this condition include:  Thick scales on the scalp.  Redness on the face or in the armpits.  Skin that is flaky. The flakes may be white or yellow.  Skin that seems oily or dry but is not helped with moisturizers.  Itching or burning in the affected areas.  How is this diagnosed? This condition is diagnosed with a medical history and physical exam. A sample of your skin may be tested (skin biopsy). You may need to see a skin specialist (dermatologist). How is this treated? There is no cure for this condition, but treatment can help to manage the symptoms. You may get treatment to remove scales, lower the risk of skin infection, and reduce swelling or itching. Treatment may include:  Creams that reduce swelling and irritation (steroids).  Creams that reduce skin yeast.  Medicated shampoo, soaps, moisturizing creams, or ointments.  Medicated moisturizing creams or ointments.  Follow these instructions at home:  Apply over-the-counter and prescription medicines only as told by your health care provider.  Use any medicated shampoo, soaps, skin creams, or ointments only as told by your health care provider.  Keep all follow-up visits as told by your health care provider. This is important. Contact a health care provider if:  Your symptoms do not improve with treatment.  Your symptoms get worse.  You have new symptoms. This information is not intended to replace advice given to you by your health care provider. Make sure you discuss any questions you have with your health care provider. Document Released: 10/15/2005 Document Revised: 05/04/2016 Document Reviewed: 02/02/2016 Elsevier Interactive Patient Education  [employer].

## 2018-05-05 ENCOUNTER — Ambulatory Visit (INDEPENDENT_AMBULATORY_CARE_PROVIDER_SITE_OTHER): Payer: Medicare Other | Admitting: Family Medicine

## 2018-05-05 ENCOUNTER — Encounter: Payer: Self-pay | Admitting: Family Medicine

## 2018-05-05 VITALS — BP 120/68 | HR 86 | Temp 98.3°F | Wt 225.0 lb

## 2018-05-05 DIAGNOSIS — R05 Cough: Secondary | ICD-10-CM

## 2018-05-05 DIAGNOSIS — J441 Chronic obstructive pulmonary disease with (acute) exacerbation: Secondary | ICD-10-CM | POA: Diagnosis not present

## 2018-05-05 DIAGNOSIS — R600 Localized edema: Secondary | ICD-10-CM | POA: Diagnosis not present

## 2018-05-05 DIAGNOSIS — R059 Cough, unspecified: Secondary | ICD-10-CM

## 2018-05-05 LAB — CBC WITH DIFFERENTIAL/PLATELET
BASOS ABS: 0.1 10*3/uL (ref 0.0–0.1)
Basophils Relative: 1.1 % (ref 0.0–3.0)
Eosinophils Absolute: 0.4 10*3/uL (ref 0.0–0.7)
Eosinophils Relative: 5.9 % — ABNORMAL HIGH (ref 0.0–5.0)
HCT: 39.3 % (ref 39.0–52.0)
Hemoglobin: 13.4 g/dL (ref 13.0–17.0)
LYMPHS ABS: 1.8 10*3/uL (ref 0.7–4.0)
Lymphocytes Relative: 23.8 % (ref 12.0–46.0)
MCHC: 34.1 g/dL (ref 30.0–36.0)
MCV: 82 fl (ref 78.0–100.0)
Monocytes Absolute: 0.9 10*3/uL (ref 0.1–1.0)
Monocytes Relative: 12 % (ref 3.0–12.0)
NEUTROS ABS: 4.3 10*3/uL (ref 1.4–7.7)
NEUTROS PCT: 57.2 % (ref 43.0–77.0)
PLATELETS: 152 10*3/uL (ref 150.0–400.0)
RBC: 4.79 Mil/uL (ref 4.22–5.81)
RDW: 16.6 % — ABNORMAL HIGH (ref 11.5–15.5)
WBC: 7.5 10*3/uL (ref 4.0–10.5)

## 2018-05-05 LAB — BRAIN NATRIURETIC PEPTIDE: Pro B Natriuretic peptide (BNP): 88 pg/mL (ref 0.0–100.0)

## 2018-05-05 MED ORDER — DOXYCYCLINE HYCLATE 100 MG PO TABS: 100.0000 mg | ORAL_TABLET | Freq: Two times a day (BID) | ORAL | 0 refills | Status: DC

## 2018-05-05 MED ORDER — PREDNISONE 10 MG PO TABS: ORAL_TABLET | ORAL | 0 refills | Status: DC

## 2018-05-05 NOTE — Progress Notes (Signed)
Subjective:    Patient ID: Cameron [name], male    DOB: Jun 05, 1941, 77 y.o.   MRN: [id number]  No chief complaint on file.   HPI Patient is a 77-yo male with pmh sig for CAD s/p CABG 2002, CKD stage IV, AS s/p TAVR, PAF with RBBB, diastolic CHF, type 2 diabetes, HTN, and HLD seen today for ongoing concern.  Pt endorses productive cough x 2 to 3 weeks.  Pt coughing up yellow sputum.  He denies fever, chills, nausea, vomiting.  Pt denies recent sick contacts, sore throat, rhinorrhea, congestion.  Pt is a former smoker, 40 pack year hx.  Pt also notes drainage of posterior left leg/ankle.  At last [place] 04/01/2018, pt had bleeding at this same area.  Denies pain or increased erythema.  Past Medical History:  Diagnosis Date  . CAD (coronary artery disease)    a. 2002: s/p CABG x 4V (LIMA-LAD, seq SVG-PDA, PLA, seq SVG-OM1, OM2, SVG-Diag1)  . CKD (chronic kidney disease)   . COPD (chronic obstructive pulmonary disease) ([place])   . Diabetes mellitus without complication ([place])   . HLD (hyperlipidemia)   . Hypertension   . Obesity   . PAF (paroxysmal atrial fibrillation) ([place])   . RBBB   . 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 Severe aortic stenosis     Allergies  Allergen Reactions  . Cefuroxime Diarrhea  . Ezetimibe Diarrhea    ROS General: Denies fever, chills, night sweats, changes in weight, changes in appetite HEENT: Denies headaches, ear pain, changes in vision, rhinorrhea, sore throat CV: Denies CP, palpitations, SOB, orthopnea Pulm: Denies SOB, wheezing  +cough GI: Denies abdominal pain, nausea, vomiting, diarrhea, constipation GU: Denies dysuria, hematuria, frequency, vaginal discharge Msk: Denies muscle cramps, joint pains Neuro: Denies weakness, numbness, tingling Skin: Denies rashes, bruising  +drainage from wound on L post. Ankle/leg Psych: Denies depression, anxiety,  hallucinations     Objective:    Blood pressure 120/68, pulse 86, temperature 98.3 F (36.8 C), temperature source Oral, weight 225 lb (102.1 kg), SpO2 95 %.   Gen. Pleasant, well-nourished, in no distress, normal affect  HEENT: [place]/AT, face symmetric, no scleral icterus, PERRLA, nares patent without drainage, pharynx without erythema or exudate. Lungs: no accessory muscle use, CTAB, no wheezes or rales Cardiovascular: RRR, no m/r/g, 2+ pitting edema in b/l LEs Abdomen: BS present, soft, NT/ND Neuro:  A&Ox3, CN II-XII intact, normal gait Skin:  Warm, mild erythema of b/l ankles, chronic skin changes from increased edema (lumpy, tree bark appearance).  L posterior ankle with small area 1cm, with mild clear/yellow drainage, no TTP, increased warmth, purulence,or induration.   Wt Readings from Last 3 Encounters:  05/05/18 225 lb (102.1 kg)  04/01/18 215 lb (97.5 kg)  03/31/18 212 lb (96.2 kg)    Lab Results  Component Value Date   WBC 7.5 05/05/2018   HGB 13.4 05/05/2018   HCT 39.3 05/05/2018   PLT 152.0 05/05/2018   GLUCOSE 106 (H) 02/19/2018   CHOL 135 09/15/2017   TRIG 126 09/20/2017   HDL 28 (L) 09/15/2017   LDLCALC 73 09/15/2017   ALT 18 11/29/2017   AST 20 11/29/2017   NA 139 02/19/2018   K 3.4 (L) 02/19/2018   CL 103 02/19/2018   CREATININE 3.70 (H) 02/19/2018   BUN 63 (H) 02/19/2018   CO2 22 02/19/2018   TSH 3.159 09/14/2017   INR  1.30 12/03/2017   HGBA1C 8.2 (A) 03/31/2018    Assessment/Plan:  Cough  -given previous smoking hx will treat as COPD exacerbation -will obtain labs -discussed CXR, however pt declines at this time. - Plan: doxycycline (VIBRA-TABS) 100 MG tablet, predniSONE (DELTASONE) 10 MG tablet, Brain Natriuretic Peptide, CBC with Differential/Platelet  Edema of both lower extremities -discussed causes including likely CHF exacerbation.  10 lb wt gain in 1 month, ongoing cough. -pt advised to elevate LE when sitting -discussed increasing  lasix to 40 mg BID x 3 days, then going back to once daily dosing. -Pt also advised to contact his cardiology office to inform them of current concerns. -will obtain BNP  COPD exacerbation ([place]) -40 yr smoking hx   F/u prn.  Will call pt with lab results  Grier Mitts, MD

## 2018-05-05 NOTE — Patient Instructions (Addendum)
Please increase your lasix 40 mg to twice a day for the next 3 days.  You can then return to your normal dosing (lasix 40 mg daily).  You should also contact your cardiology office to make them aware of this change.  Cough, Adult Coughing is a reflex that clears your throat and your airways. Coughing helps to heal and protect your lungs. It is normal to cough occasionally, but a cough that happens with other symptoms or lasts a long time may be a sign of a condition that needs treatment. A cough may last only 2-3 weeks (acute), or it may last longer than 8 weeks (chronic). What are the causes? Coughing is commonly caused by:  Breathing in substances that irritate your lungs.  A viral or bacterial respiratory infection.  Allergies.  Asthma.  Postnasal drip.  Smoking.  Acid backing up from the stomach into the esophagus (gastroesophageal reflux).  Certain medicines.  Chronic lung problems, including COPD (or rarely, lung cancer).  Other medical conditions such as heart failure.  Follow these instructions at home: Pay attention to any changes in your symptoms. Take these actions to help with your discomfort:  Take medicines only as told by your health care provider. ? If you were prescribed an antibiotic medicine, take it as told by your health care provider. Do not stop taking the antibiotic even if you start to feel better. ? Talk with your health care provider before you take a cough suppressant medicine.  Drink enough fluid to keep your urine clear or pale yellow.  If the air is dry, use a cold steam vaporizer or humidifier in your bedroom or your home to help loosen secretions.  Avoid anything that causes you to cough at work or at home.  If your cough is worse at night, try sleeping in a semi-upright position.  Avoid cigarette smoke. If you smoke, quit smoking. If you need help quitting, ask your health care provider.  Avoid caffeine.  Avoid alcohol.  Rest as  needed.  Contact a health care provider if:  You have new symptoms.  You cough up pus.  Your cough does not get better after 2-3 weeks, or your cough gets worse.  You cannot control your cough with suppressant medicines and you are losing sleep.  You develop pain that is getting worse or pain that is not controlled with pain medicines.  You have a fever.  You have unexplained weight loss.  You have night sweats. Get help right away if:  You cough up blood.  You have difficulty breathing.  Your heartbeat is very fast. This information is not intended to replace advice given to you by your health care provider. Make sure you discuss any questions you have with your health care provider. Document Released: 04/13/2011 Document Revised: 03/22/2016 Document Reviewed: 12/22/2014 Elsevier Interactive Patient Education  2018 [employer].  Edema Edema is when you have too much fluid in your body or under your skin. Edema may make your legs, feet, and ankles swell up. Swelling is also common in looser tissues, like around your eyes. This is a common condition. It gets more common as you get older. There are many possible causes of edema. Eating too much salt (sodium) and being on your feet or sitting for a long time can cause edema in your legs, feet, and ankles. Hot weather may make edema worse. Edema is usually painless. Your skin may look swollen or shiny. Follow these instructions at home:  Keep the  swollen body part raised (elevated) above the level of your heart when you are sitting or lying down.  Do not sit still or stand for a long time.  Do not wear tight clothes. Do not wear garters on your upper legs.  Exercise your legs. This can help the swelling go down.  Wear elastic bandages or support stockings as told by your doctor.  Eat a low-salt (low-sodium) diet to reduce fluid as told by your doctor.  Depending on the cause of your swelling, you may need to limit how  much fluid you drink (fluid restriction).  Take over-the-counter and prescription medicines only as told by your doctor. Contact a doctor if:  Treatment is not working.  You have heart, liver, or kidney disease and have symptoms of edema.  You have sudden and unexplained weight gain. Get help right away if:  You have shortness of breath or chest pain.  You cannot breathe when you lie down.  You have pain, redness, or warmth in the swollen areas.  You have heart, liver, or kidney disease and get edema all of a sudden.  You have a fever and your symptoms get worse all of a sudden. Summary  Edema is when you have too much fluid in your body or under your skin.  Edema may make your legs, feet, and ankles swell up. Swelling is also common in looser tissues, like around your eyes.  Raise (elevate) the swollen body part above the level of your heart when you are sitting or lying down.  Follow your doctor's instructions about diet and how much fluid you can drink (fluid restriction). This information is not intended to replace advice given to you by your health care provider. Make sure you discuss any questions you have with your health care provider. Document Released: 07-01-202009 Document Revised: 11/02/2016 Document Reviewed: 11/02/2016 Elsevier Interactive Patient Education  2017 [employer].

## 2018-06-20 ENCOUNTER — Ambulatory Visit: Payer: Medicare Other | Admitting: Endocrinology

## 2018-06-20 ENCOUNTER — Encounter: Payer: Self-pay | Admitting: Endocrinology

## 2018-06-20 VITALS — BP 162/80 | HR 80 | Ht 69.0 in | Wt 224.0 lb

## 2018-06-20 DIAGNOSIS — N184 Chronic kidney disease, stage 4 (severe): Secondary | ICD-10-CM | POA: Diagnosis not present

## 2018-06-20 DIAGNOSIS — E1122 Type 2 diabetes mellitus with diabetic chronic kidney disease: Secondary | ICD-10-CM

## 2018-06-20 DIAGNOSIS — Z794 Long term (current) use of insulin: Secondary | ICD-10-CM | POA: Diagnosis not present

## 2018-06-20 LAB — POCT GLYCOSYLATED HEMOGLOBIN (HGB A1C): HEMOGLOBIN A1C: 8.9 % — AB (ref 4.0–5.6)

## 2018-06-20 MED ORDER — INSULIN NPH (HUMAN) (ISOPHANE) 100 UNIT/ML ~~LOC~~ SUSP: 75.0000 [IU] | Freq: Every day | SUBCUTANEOUS | 11 refills | Status: DC

## 2018-06-20 MED ORDER — INSULIN REGULAR HUMAN 100 UNIT/ML IJ SOLN: INTRAMUSCULAR | 11 refills | Status: DC

## 2018-06-20 NOTE — Patient Instructions (Addendum)
Your blood pressure is high today.  Please see your primary care provider soon, to have it rechecked.   check your blood sugar twice a day.  vary the time of day when you check, between before the 3 meals, and at bedtime.  also check if you have symptoms of your blood sugar being too high or too low.  please keep a record of the readings and bring it to your next appointment here (or you can bring the meter itself).  You can write it on any piece of paper.  please call us sooner if your blood sugar goes below 70, or if you have a lot of readings over 200.   Please increase the insulins to the numbers listed below.   Please come back for a follow-up appointment in 2 months.

## 2018-06-20 NOTE — Progress Notes (Signed)
Subjective:    Patient ID: Cameron [name], male    DOB: May 27, 1941, 77 y.o.   MRN: [id number]  HPI Pt returns for f/u of diabetes mellitus: DM type: Insulin-requiring type 2 Dx'ed: 9983 Complications: renal failure, PAD, and CAD Therapy: insulin since 2010 DKA: never Severe hypoglycemia: never Pancreatitis: never Pancreatic imaging: normal on 2019 CT Other: he takes multiple daily injections; he takes human insulin, due to cost.   Interval history: no cbg record, but states cbg's vary from 100-200's.  He checks fasting.  pt states he feels well in general.  He says he seldom misses the insulin.   Past Medical History:  Diagnosis Date  . CAD (coronary artery disease)    a. 2002: s/p CABG x 4V (LIMA-LAD, seq SVG-PDA, PLA, seq SVG-OM1, OM2, SVG-Diag1)  . CKD (chronic kidney disease)   . COPD (chronic obstructive pulmonary disease) ([place])   . Diabetes mellitus without complication ([place])   . HLD (hyperlipidemia)   . Hypertension   . Obesity   . PAF (paroxysmal atrial fibrillation) ([place])   . RBBB   . 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 Severe aortic stenosis     Past Surgical History:  Procedure Laterality Date  . ABDOMINAL AORTAGRAM Right 01/28/2018   LOWER EXTREMITY  . ABDOMINAL AORTOGRAM W/LOWER EXTREMITY N/A 01/28/2018   Procedure: ABDOMINAL AORTOGRAM W/LOWER EXTREMITY [place];  Surgeon: Nigel Mormon, MD;  Location: [place] [facility];  Service: Cardiovascular;  Laterality: N/A;  . CARDIAC SURGERY    . CORONARY BALLOON ANGIOPLASTY N/A 09/23/2017   Procedure: CORONARY BALLOON ANGIOPLASTY;  Surgeon: Nigel Mormon, MD;  Location: [place] [facility];  Service: Cardiovascular;  Laterality: N/A;  . FINGER AMPUTATION    . HERNIA REPAIR    . open heart surgery    . PERIPHERAL VASCULAR BALLOON ANGIOPLASTY  01/28/2018   Procedure: PERIPHERAL VASCULAR BALLOON  ANGIOPLASTY;  Surgeon: Nigel Mormon, MD;  Location: [facility];  Service: Cardiovascular;;  Rt SFA  . RIGHT HEART CATH AND CORONARY/GRAFT ANGIOGRAPHY N/A 09/17/2017   Procedure: RIGHT HEART CATH AND CORONARY/GRAFT ANGIOGRAPHY;  Surgeon: Nigel Mormon, MD;  Location: [place];  Service: Cardiovascular;  Laterality: N/A;  . TEE WITHOUT CARDIOVERSION N/A 12/03/2017   Procedure: TRANSESOPHAGEAL ECHOCARDIOGRAM (TEE);  Surgeon: Sherren Mocha, MD;  Location: [facility];  Service: Open Heart Surgery;  Laterality: N/A;  . TOE AMPUTATION    . TRANSCATHETER AORTIC VALVE REPLACEMENT, TRANSFEMORAL N/A 12/03/2017   Procedure: TRANSCATHETER AORTIC VALVE REPLACEMENT, TRANSFEMORAL;  Surgeon: Sherren Mocha, MD;  Location: [facility];  Service: Open Heart Surgery;  Laterality: N/A;  . ULTRASOUND GUIDANCE FOR VASCULAR ACCESS  09/17/2017   Procedure: Ultrasound Guidance For Vascular Access;  Surgeon: Nigel Mormon, MD;  Location: [place] [facility];  Service: Cardiovascular;;    Social History   Socioeconomic History  . Marital status: Married    Spouse name: Not on file  . Number of children: 2  . Years of education: Not on file  . Highest education level: Not on file  Occupational History  . Occupation: Doctor, general practice  Social Needs  . Financial resource strain: Not on file  . Food insecurity:    Worry: Not on file    Inability: Not on file  . Transportation needs:    Medical: Not on file    Non-medical: Not on file  Tobacco  Use  . Smoking status: Former Smoker    Packs/day: 2.00    Years: 20.00    Pack years: 40.00    Types: Cigarettes    Last attempt to quit: 09/28/1978    Years since quitting: 39.7  . Smokeless tobacco: Never Used  Substance and Sexual Activity  . Alcohol use: No  . Drug use: No  . Sexual activity: Not on file  Lifestyle  . Physical activity:    Days per week: Not on file    Minutes per session: Not on file  . Stress: Not on file   Relationships  . Social connections:    Talks on phone: Not on file    Gets together: Not on file    Attends religious service: Not on file    Active member of club or organization: Not on file    Attends meetings of clubs or organizations: Not on file    Relationship status: Not on file  . Intimate partner violence:    Fear of current or ex partner: Not on file    Emotionally abused: Not on file    Physically abused: Not on file    Forced sexual activity: Not on file  Other Topics Concern  . Not on file  Social History Narrative  . Not on file    Current Outpatient Medications on File Prior to Visit  Medication Sig Dispense Refill  . amLODipine (NORVASC) 10 MG tablet Take 10 mg by mouth daily.    Marland Kitchen ammonium lactate (AMLACTIN) 12 % lotion Apply 1 application topically as needed for dry skin. Recommend twice daily until dry skin improved 500 g 0  . amoxicillin (AMOXIL) 500 MG capsule Take 4 tablets (2g total) one hour prior to dental appointment (Patient taking differently: Take 2,000 mg by mouth See admin instructions. Take 4 tablets (2g total) one hour prior to dental appointment) 4 capsule 12  . aspirin EC 81 MG EC tablet Take 1 tablet (81 mg total) by mouth daily.    . citalopram (CELEXA) 10 MG tablet Take 1 tablet (10 mg total) by mouth daily. 60 tablet 3  . furosemide (LASIX) 40 MG tablet Take 1 tablet (40 mg total) by mouth 2 (two) times daily. 60 tablet 11  . glucose blood (ONETOUCH VERIO) test strip 1 each by Other route 2 (two) times daily. And lancets 2/day 100 each 12  . Insulin Pen Needle 31G X 5 MM MISC Used to give insulin injections 4 times a day. 200 each 11  . metoprolol succinate (TOPROL-XL) 50 MG 24 hr tablet Take 50 mg by mouth daily. Take with or immediately following a meal.    . nitroGLYCERIN (NITROSTAT) 0.4 MG SL tablet Place 0.4 mg every 5 (five) minutes as needed under the tongue for chest pain.    . Rivaroxaban (XARELTO) 15 MG TABS tablet Take 1 tablet (15  mg total) by mouth daily with supper. 60 tablet 3  . rosuvastatin (CRESTOR) 40 MG tablet Take 40 mg daily after supper by mouth.     . triamcinolone (KENALOG) 0.025 % ointment Apply 1 application topically 2 (two) times daily. 30 g 0  . isosorbide mononitrate (IMDUR) 60 MG 24 hr tablet Take 60 mg by mouth daily.     No current facility-administered medications on file prior to visit.     Allergies  Allergen Reactions  . Cefuroxime Diarrhea  . Ezetimibe Diarrhea    Family History  Problem Relation Age of Onset  . Diabetes  Mother   . Heart disease Father   . Stomach cancer Paternal Grandfather   . Lung cancer Maternal Grandmother        smoked    BP (!) 162/80 (BP Location: Left Arm, Patient Position: Sitting, Cuff Size: Normal)   Pulse 80   Ht 5\' 9"  (1.753 m)   Wt 224 lb (101.6 kg)   SpO2 96%   BMI 33.08 kg/m    Review of Systems He denies hypoglycemia.      Objective:   Physical Exam VITAL SIGNS:  See vs page GENERAL: no distress Pulses: foot pulses are intact bilaterally.   MSK: no deformity of the feet or ankles.  CV: 1+ bilat edema of the legs Skin:  no ulcer on the feet or ankles, but the skin is dry.  Chronic erythema of the legs.  normal color and temp on the feet and ankles Neuro: sensation is intact to touch on the feet and ankles.   Ext: There is bilateral onychomycosis of the toenails   A1c=8.9%    Assessment & Plan:  Insulin-requiring type 2 DM, with PAD: worse Noncompliance with cbg recording: in this context, we have to cautiously increase insulin HTN: is noted today   Patient Instructions  Your blood pressure is high today.  Please see your primary care provider soon, to have it rechecked.   check your blood sugar twice a day.  vary the time of day when you check, between before the 3 meals, and at bedtime.  also check if you have symptoms of your blood sugar being too high or too low.  please keep a record of the readings and bring it to your  next appointment here (or you can bring the meter itself).  You can write it on any piece of paper.  please call us sooner if your blood sugar goes below 70, or if you have a lot of readings over 200.   Please increase the insulins to the numbers listed below.   Please come back for a follow-up appointment in 2 months.

## 2018-07-24 ENCOUNTER — Ambulatory Visit (INDEPENDENT_AMBULATORY_CARE_PROVIDER_SITE_OTHER): Payer: Medicare Other | Admitting: Family Medicine

## 2018-07-24 ENCOUNTER — Encounter: Payer: Self-pay | Admitting: Family Medicine

## 2018-07-24 VITALS — BP 114/80 | HR 80 | Temp 97.8°F | Wt 224.0 lb

## 2018-07-24 DIAGNOSIS — L989 Disorder of the skin and subcutaneous tissue, unspecified: Secondary | ICD-10-CM

## 2018-07-24 DIAGNOSIS — R600 Localized edema: Secondary | ICD-10-CM | POA: Diagnosis not present

## 2018-07-24 NOTE — Progress Notes (Signed)
Subjective:    Patient ID: Cameron [name], male    DOB: [birth year], 77 y.o.   MRN: [id number]  No chief complaint on file.   HPI Patient was seen today for f/u.  Pt notes the sore on the top of his head is still there.  Pt has a h/o seborrheic dermatitis of the scalp with areas that bleed if scratched/picked.    Pt also notes ongoing edema in b/l LEs.  Pt has been reducing sodium intake.  Pt with h/o CABG, venous graft removed from RLE.  Pt notes a mild burning sensation on top of R foot and dorsum of LLE.  Past Medical History:  Diagnosis Date  . CAD (coronary artery disease)    a. 2002: s/p CABG x 4V (LIMA-LAD, seq SVG-PDA, PLA, seq SVG-OM1, OM2, SVG-Diag1)  . CKD (chronic kidney disease)   . COPD (chronic obstructive pulmonary disease) ([place])   . Diabetes mellitus without complication ([place])   . HLD (hyperlipidemia)   . Hypertension   . Obesity   . PAF (paroxysmal atrial fibrillation) ([place])   . RBBB   . 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 Severe aortic stenosis     Allergies  Allergen Reactions  . Cefuroxime Diarrhea  . Ezetimibe Diarrhea    ROS General: Denies fever, chills, night sweats, changes in weight, changes in appetite HEENT: Denies headaches, ear pain, changes in vision, rhinorrhea, sore throat CV: Denies CP, palpitations, SOB, orthopnea  +LE edema Pulm: Denies SOB, cough, wheezing GI: Denies abdominal pain, nausea, vomiting, diarrhea, constipation GU: Denies dysuria, hematuria, frequency, vaginal discharge Msk: Denies muscle cramps, joint pains Neuro: Denies weakness, numbness, tingling  +burning sensation Skin: Denies rashes, bruising  +slow healing wound/scab on scalp Psych: Denies depression, anxiety, hallucinations     Objective:    Blood pressure 114/80, pulse 80, temperature 97.8 F (36.6 C), temperature source Oral, weight 224 lb (101.6 kg),  SpO2 95 %.   Gen. Pleasant, well-nourished, in no distress, normal affect   Lungs: no accessory muscle use, CTAB, no wheezes or rales Cardiovascular: RRR, peripheral edema Neuro:  A&Ox3, CN II-XII intact, normal gait Skin:  Warm. RLE with well healed scar s/p CABG.  LEs with 1+ pitting edema b/l.  Weeping areas on b/l LEs, chronic mild erythema, no warmth.  R parietal scalp with slightly raised lesion with smooth edges and central eschar.  No TTP of scalp lesion or drainage noted.   Wt Readings from Last 3 Encounters:  07/24/18 224 lb (101.6 kg)  06/20/18 224 lb (101.6 kg)  05/05/18 225 lb (102.1 kg)    Lab Results  Component Value Date   WBC 7.5 05/05/2018   HGB 13.4 05/05/2018   HCT 39.3 05/05/2018   PLT 152.0 05/05/2018   GLUCOSE 106 (H) 02/19/2018   CHOL 135 09/15/2017   TRIG 126 09/20/2017   HDL 28 (L) 09/15/2017   LDLCALC 73 09/15/2017   ALT 18 11/29/2017   AST 20 11/29/2017   NA 139 02/19/2018   K 3.4 (L) 02/19/2018   CL 103 02/19/2018   CREATININE 3.70 (H) 02/19/2018   BUN 63 (H) 02/19/2018   CO2 22 02/19/2018   TSH 3.159 09/14/2017   INR 1.30 12/03/2017   HGBA1C 8.9 (A) 06/20/2018    Assessment/Plan:  Scalp lesion  -given prolonged time to heal and appearance of base of lesion discussed evaluation and possible biopsy/removal by Derm. -  Plan: Ambulatory referral to Dermatology ---update, pt contacted to schedule Derm appt, however he declined.  Lower extremity edema -discussed elevating LEs, reducing sodium intake, and using TED hose. -pt's b/l LEs dressed with Tefla and ace bandages.  F/u prn.  Grier Mitts, MD

## 2018-07-24 NOTE — Patient Instructions (Signed)
Edema Edema is when you have too much fluid in your body or under your skin. Edema may make your legs, feet, and ankles swell up. Swelling is also common in looser tissues, like around your eyes. This is a common condition. It gets more common as you get older. There are many possible causes of edema. Eating too much salt (sodium) and being on your feet or sitting for a long time can cause edema in your legs, feet, and ankles. Hot weather may make edema worse. Edema is usually painless. Your skin may look swollen or shiny. Follow these instructions at home:  Keep the swollen body part raised (elevated) above the level of your heart when you are sitting or lying down.  Do not sit still or stand for a long time.  Do not wear tight clothes. Do not wear garters on your upper legs.  Exercise your legs. This can help the swelling go down.  Wear elastic bandages or support stockings as told by your doctor.  Eat a low-salt (low-sodium) diet to reduce fluid as told by your doctor.  Depending on the cause of your swelling, you may need to limit how much fluid you drink (fluid restriction).  Take over-the-counter and prescription medicines only as told by your doctor. Contact a doctor if:  Treatment is not working.  You have heart, liver, or kidney disease and have symptoms of edema.  You have sudden and unexplained weight gain. Get help right away if:  You have shortness of breath or chest pain.  You cannot breathe when you lie down.  You have pain, redness, or warmth in the swollen areas.  You have heart, liver, or kidney disease and get edema all of a sudden.  You have a fever and your symptoms get worse all of a sudden. Summary  Edema is when you have too much fluid in your body or under your skin.  Edema may make your legs, feet, and ankles swell up. Swelling is also common in looser tissues, like around your eyes.  Raise (elevate) the swollen body part above the level of your  heart when you are sitting or lying down.  Follow your doctor's instructions about diet and how much fluid you can drink (fluid restriction). This information is not intended to replace advice given to you by your health care provider. Make sure you discuss any questions you have with your health care provider. Document Released: 04/02/2008 Document Revised: 11/02/2016 Document Reviewed: 11/02/2016 Elsevier Interactive Patient Education  2017 [employer] Inc[employer]  

## 2018-07-27 ENCOUNTER — Encounter: Payer: Self-pay | Admitting: Family Medicine

## 2018-08-18 ENCOUNTER — Ambulatory Visit (INDEPENDENT_AMBULATORY_CARE_PROVIDER_SITE_OTHER): Payer: Medicare Other

## 2018-08-18 DIAGNOSIS — Z23 Encounter for immunization: Secondary | ICD-10-CM

## 2018-08-22 ENCOUNTER — Ambulatory Visit: Payer: Medicare Other | Admitting: Endocrinology

## 2018-08-22 ENCOUNTER — Encounter: Payer: Self-pay | Admitting: Endocrinology

## 2018-08-22 VITALS — BP 146/88 | HR 78 | Ht 69.0 in | Wt 223.0 lb

## 2018-08-22 DIAGNOSIS — E1122 Type 2 diabetes mellitus with diabetic chronic kidney disease: Secondary | ICD-10-CM

## 2018-08-22 DIAGNOSIS — Z794 Long term (current) use of insulin: Secondary | ICD-10-CM

## 2018-08-22 DIAGNOSIS — N184 Chronic kidney disease, stage 4 (severe): Secondary | ICD-10-CM

## 2018-08-22 LAB — POCT GLYCOSYLATED HEMOGLOBIN (HGB A1C): HEMOGLOBIN A1C: 8.2 % — AB (ref 4.0–5.6)

## 2018-08-22 MED ORDER — INSULIN NPH (HUMAN) (ISOPHANE) 100 UNIT/ML ~~LOC~~ SUSP: 85.0000 [IU] | Freq: Every day | SUBCUTANEOUS | 11 refills | Status: DC

## 2018-08-22 MED ORDER — GLUCOSE BLOOD VI STRP: 1.0000 | ORAL_STRIP | Freq: Two times a day (BID) | 12 refills | Status: DC

## 2018-08-22 NOTE — Patient Instructions (Addendum)
Your blood pressure is high today.  Please see your primary care provider soon, to have it rechecked.   check your blood sugar twice a day.  vary the time of day when you check, between before the 3 meals, and at bedtime.  also check if you have symptoms of your blood sugar being too high or too low.  please keep a record of the readings and bring it to your next appointment here (or you can bring the meter itself).  You can write it on any piece of paper.  please call us sooner if your blood sugar goes below 70, or if you have a lot of readings over 200.   Please increase the NPH to 85 units at bedtime, and:  Please continue the same regular insulin.  Please come back for a follow-up appointment in 3 months.

## 2018-08-22 NOTE — Progress Notes (Signed)
Subjective:    Patient ID: Cameron [name], male    DOB: [birth year], 77 y.o.   MRN: [id number]  HPI Pt returns for f/u of diabetes mellitus: DM type: Insulin-requiring type 2 Dx'ed: 4128 Complications: renal failure, PAD, and CAD Therapy: insulin since 2010 DKA: never Severe hypoglycemia: never Pancreatitis: never Pancreatic imaging: normal on 2019 CT Other: he takes multiple daily injections; he takes human insulin, due to cost.   Interval history: pt says he seldom checks cbg.  He says when he does, he checks fasting.  It is approx 200.  pt states he feels well in general.  He says he seldom misses the insulin.   Past Medical History:  Diagnosis Date  . CAD (coronary artery disease)    a. 2002: s/p CABG x 4V (LIMA-LAD, seq SVG-PDA, PLA, seq SVG-OM1, OM2, SVG-Diag1)  . CKD (chronic kidney disease)   . COPD (chronic obstructive pulmonary disease) ([place])   . Diabetes mellitus without complication ([place])   . HLD (hyperlipidemia)   . Hypertension   . Obesity   . PAF (paroxysmal atrial fibrillation) ([place])   . RBBB   . 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 Severe aortic stenosis     Past Surgical History:  Procedure Laterality Date  . ABDOMINAL AORTAGRAM Right 01/28/2018   LOWER EXTREMITY  . ABDOMINAL AORTOGRAM W/LOWER EXTREMITY N/A 01/28/2018   Procedure: ABDOMINAL AORTOGRAM W/LOWER EXTREMITY [place];  Surgeon: Nigel Mormon, MD;  Location: [facility];  Service: Cardiovascular;  Laterality: N/A;  . CARDIAC SURGERY    . CORONARY BALLOON ANGIOPLASTY N/A 09/23/2017   Procedure: CORONARY BALLOON ANGIOPLASTY;  Surgeon: Nigel Mormon, MD;  Location: [facility];  Service: Cardiovascular;  Laterality: N/A;  . FINGER AMPUTATION    . HERNIA REPAIR    . open heart surgery    . PERIPHERAL VASCULAR BALLOON ANGIOPLASTY  01/28/2018   Procedure: PERIPHERAL VASCULAR  BALLOON ANGIOPLASTY;  Surgeon: Nigel Mormon, MD;  Location: [facility];  Service: Cardiovascular;;  Rt SFA  . RIGHT HEART CATH AND CORONARY/GRAFT ANGIOGRAPHY N/A 09/17/2017   Procedure: RIGHT HEART CATH AND CORONARY/GRAFT ANGIOGRAPHY;  Surgeon: Nigel Mormon, MD;  Location: [place] [facility] [facility];  Service: Cardiovascular;  Laterality: N/A;  . TEE WITHOUT CARDIOVERSION N/A 12/03/2017   Procedure: TRANSESOPHAGEAL ECHOCARDIOGRAM (TEE);  Surgeon: Sherren Mocha, MD;  Location: [place] [facility];  Service: Open Heart Surgery;  Laterality: N/A;  . TOE AMPUTATION    . TRANSCATHETER AORTIC VALVE REPLACEMENT, TRANSFEMORAL N/A 12/03/2017   Procedure: TRANSCATHETER AORTIC VALVE REPLACEMENT, TRANSFEMORAL;  Surgeon: Sherren Mocha, MD;  Location: [place];  Service: Open Heart Surgery;  Laterality: N/A;  . ULTRASOUND GUIDANCE FOR VASCULAR ACCESS  09/17/2017   Procedure: Ultrasound Guidance For Vascular Access;  Surgeon: Nigel Mormon, MD;  Location: [place];  Service: Cardiovascular;;    Social History   Socioeconomic History  . Marital status: Married    Spouse name: Not on file  . Number of children: 2  . Years of education: Not on file  . Highest education level: Not on file  Occupational History  . Occupation: Doctor, general practice  Social Needs  . Financial resource strain: Not on file  . Food insecurity:    Worry: Not on file    Inability: Not on file  . Transportation needs:    Medical: Not on file  Non-medical: Not on file  Tobacco Use  . Smoking status: Former Smoker    Packs/day: 2.00    Years: 20.00    Pack years: 40.00    Types: Cigarettes    Last attempt to quit: 09/28/1978    Years since quitting: 39.9  . Smokeless tobacco: Never Used  Substance and Sexual Activity  . Alcohol use: No  . Drug use: No  . Sexual activity: Not on file  Lifestyle  . Physical activity:    Days per week: Not on file    Minutes per session: Not on file  . Stress: Not  on file  Relationships  . Social connections:    Talks on phone: Not on file    Gets together: Not on file    Attends religious service: Not on file    Active member of club or organization: Not on file    Attends meetings of clubs or organizations: Not on file    Relationship status: Not on file  . Intimate partner violence:    Fear of current or ex partner: Not on file    Emotionally abused: Not on file    Physically abused: Not on file    Forced sexual activity: Not on file  Other Topics Concern  . Not on file  Social History Narrative  . Not on file    Current Outpatient Medications on File Prior to Visit  Medication Sig Dispense Refill  . amLODipine (NORVASC) 10 MG tablet Take 10 mg by mouth daily.    Marland Kitchen ammonium lactate (AMLACTIN) 12 % lotion Apply 1 application topically as needed for dry skin. Recommend twice daily until dry skin improved 500 g 0  . amoxicillin (AMOXIL) 500 MG capsule Take 4 tablets (2g total) one hour prior to dental appointment (Patient taking differently: Take 2,000 mg by mouth See admin instructions. Take 4 tablets (2g total) one hour prior to dental appointment) 4 capsule 12  . aspirin EC 81 MG EC tablet Take 1 tablet (81 mg total) by mouth daily.    . citalopram (CELEXA) 10 MG tablet Take 1 tablet (10 mg total) by mouth daily. 60 tablet 3  . furosemide (LASIX) 40 MG tablet Take 1 tablet (40 mg total) by mouth 2 (two) times daily. 60 tablet 11  . insulin regular (NOVOLIN R,HUMULIN R) 100 units/mL injection 3 times a day (just before each meal), 55-55-75 units, and syringes 4/day 50 mL 11  . isosorbide mononitrate (IMDUR) 60 MG 24 hr tablet Take 60 mg by mouth daily.    . metoprolol succinate (TOPROL-XL) 50 MG 24 hr tablet Take 50 mg by mouth daily. Take with or immediately following a meal.    . nitroGLYCERIN (NITROSTAT) 0.4 MG SL tablet Place 0.4 mg every 5 (five) minutes as needed under the tongue for chest pain.    . Rivaroxaban (XARELTO) 15 MG TABS  tablet Take 1 tablet (15 mg total) by mouth daily with supper. 60 tablet 3  . rosuvastatin (CRESTOR) 40 MG tablet Take 40 mg daily after supper by mouth.     . triamcinolone (KENALOG) 0.025 % ointment Apply 1 application topically 2 (two) times daily. 30 g 0   No current facility-administered medications on file prior to visit.     Allergies  Allergen Reactions  . Cefuroxime Diarrhea  . Ezetimibe Diarrhea    Family History  Problem Relation Age of Onset  . Diabetes Mother   . Heart disease Father   . Stomach cancer Paternal  Grandfather   . Lung cancer Maternal Grandmother        smoked    BP (!) 146/88   Pulse 78   Ht 5\' 9"  (1.753 m)   Wt 223 lb (101.2 kg)   SpO2 95%   BMI 32.93 kg/m    Review of Systems He denies hypoglycemia    Objective:   Physical Exam VITAL SIGNS:  See vs page GENERAL: no distress Pulses: foot pulses are intact bilaterally.   MSK: no deformity of the feet or ankles.  CV: 1+ bilat edema of the legs Skin:  no ulcer on the feet or ankles.  Chronic erythema of the legs--no change.  normal color and temp on the feet and ankles Neuro: sensation is intact to touch on the feet and ankles.   Ext: There is severe bilateral onychomycosis of the toenails.   Lab Results  Component Value Date   HGBA1C 8.2 (A) 08/22/2018      Assessment & Plan:  HTN: is noted today Insulin-requiring type 2 DM, with PAD Renal failure: he is at risk for hypoglycemia, so we'll increase insulin cautiously.  Patient Instructions  Your blood pressure is high today.  Please see your primary care provider soon, to have it rechecked.   check your blood sugar twice a day.  vary the time of day when you check, between before the 3 meals, and at bedtime.  also check if you have symptoms of your blood sugar being too high or too low.  please keep a record of the readings and bring it to your next appointment here (or you can bring the meter itself).  You can write it on any piece of  paper.  please call us sooner if your blood sugar goes below 70, or if you have a lot of readings over 200.   Please increase the NPH to 85 units at bedtime, and:  Please continue the same regular insulin.  Please come back for a follow-up appointment in 3 months.

## 2018-09-18 ENCOUNTER — Other Ambulatory Visit: Payer: Self-pay

## 2018-09-18 MED ORDER — INSULIN NPH (HUMAN) (ISOPHANE) 100 UNIT/ML ~~LOC~~ SUSP: 85.0000 [IU] | Freq: Every day | SUBCUTANEOUS | 11 refills | Status: DC

## 2018-09-18 NOTE — Telephone Encounter (Signed)
Received notification from [facility] re: pt request to refill Relion Novolin N. Request authorized by Dr. Loanne Drilling at the same dose as NPH and refill sent as requested. NPH order has been d/c'd at Dr. Cordelia Pen request

## 2018-09-21 ENCOUNTER — Other Ambulatory Visit: Payer: Self-pay

## 2018-09-21 ENCOUNTER — Encounter (HOSPITAL_BASED_OUTPATIENT_CLINIC_OR_DEPARTMENT_OTHER): Payer: Self-pay | Admitting: Adult Health

## 2018-09-21 ENCOUNTER — Emergency Department (HOSPITAL_BASED_OUTPATIENT_CLINIC_OR_DEPARTMENT_OTHER)
Admission: EM | Admit: 2018-09-21 | Discharge: 2018-09-21 | Disposition: A | Discharge status: Home / Self Care | Payer: Medicare Other | Attending: Emergency Medicine | Admitting: Emergency Medicine

## 2018-09-21 ENCOUNTER — Emergency Department (HOSPITAL_BASED_OUTPATIENT_CLINIC_OR_DEPARTMENT_OTHER): Payer: Medicare Other

## 2018-09-21 DIAGNOSIS — I8312 Varicose veins of left lower extremity with inflammation: Secondary | ICD-10-CM | POA: Diagnosis not present

## 2018-09-21 DIAGNOSIS — N189 Chronic kidney disease, unspecified: Secondary | ICD-10-CM | POA: Insufficient documentation

## 2018-09-21 DIAGNOSIS — Z79899 Other long term (current) drug therapy: Secondary | ICD-10-CM | POA: Insufficient documentation

## 2018-09-21 DIAGNOSIS — Z7982 Long term (current) use of aspirin: Secondary | ICD-10-CM | POA: Diagnosis not present

## 2018-09-21 DIAGNOSIS — S81802A Unspecified open wound, left lower leg, initial encounter: Secondary | ICD-10-CM

## 2018-09-21 DIAGNOSIS — R609 Edema, unspecified: Secondary | ICD-10-CM | POA: Insufficient documentation

## 2018-09-21 DIAGNOSIS — E1122 Type 2 diabetes mellitus with diabetic chronic kidney disease: Secondary | ICD-10-CM | POA: Insufficient documentation

## 2018-09-21 DIAGNOSIS — I129 Hypertensive chronic kidney disease with stage 1 through stage 4 chronic kidney disease, or unspecified chronic kidney disease: Secondary | ICD-10-CM | POA: Diagnosis not present

## 2018-09-21 DIAGNOSIS — I872 Venous insufficiency (chronic) (peripheral): Secondary | ICD-10-CM

## 2018-09-21 DIAGNOSIS — Z7901 Long term (current) use of anticoagulants: Secondary | ICD-10-CM

## 2018-09-21 DIAGNOSIS — Z87891 Personal history of nicotine dependence: Secondary | ICD-10-CM | POA: Diagnosis not present

## 2018-09-21 DIAGNOSIS — T148XXA Other injury of unspecified body region, initial encounter: Secondary | ICD-10-CM

## 2018-09-21 DIAGNOSIS — I83899 Varicose veins of unspecified lower extremities with other complications: Secondary | ICD-10-CM

## 2018-09-21 DIAGNOSIS — R2242 Localized swelling, mass and lump, left lower limb: Secondary | ICD-10-CM | POA: Diagnosis present

## 2018-09-21 LAB — CBC WITH DIFFERENTIAL/PLATELET
Abs Immature Granulocytes: 0.02 10*3/uL (ref 0.00–0.07)
BASOS ABS: 0.1 10*3/uL (ref 0.0–0.1)
BASOS PCT: 1 %
EOS ABS: 0.3 10*3/uL (ref 0.0–0.5)
EOS PCT: 5 %
HCT: 42.9 % (ref 39.0–52.0)
Hemoglobin: 13.3 g/dL (ref 13.0–17.0)
Immature Granulocytes: 0 %
LYMPHS PCT: 24 %
Lymphs Abs: 1.7 10*3/uL (ref 0.7–4.0)
MCH: 27.5 pg (ref 26.0–34.0)
MCHC: 31 g/dL (ref 30.0–36.0)
MCV: 88.8 fL (ref 80.0–100.0)
Monocytes Absolute: 0.8 10*3/uL (ref 0.1–1.0)
Monocytes Relative: 11 %
NRBC: 0 % (ref 0.0–0.2)
Neutro Abs: 4.2 10*3/uL (ref 1.7–7.7)
Neutrophils Relative %: 59 %
PLATELETS: 137 10*3/uL — AB (ref 150–400)
RBC: 4.83 MIL/uL (ref 4.22–5.81)
RDW: 14.8 % (ref 11.5–15.5)
WBC: 7.1 10*3/uL (ref 4.0–10.5)

## 2018-09-21 LAB — COMPREHENSIVE METABOLIC PANEL
ALBUMIN: 3.5 g/dL (ref 3.5–5.0)
ALT: 15 U/L (ref 0–44)
ANION GAP: 8 (ref 5–15)
AST: 14 U/L — ABNORMAL LOW (ref 15–41)
Alkaline Phosphatase: 101 U/L (ref 38–126)
BUN: 43 mg/dL — ABNORMAL HIGH (ref 8–23)
CO2: 26 mmol/L (ref 22–32)
Calcium: 8.7 mg/dL — ABNORMAL LOW (ref 8.9–10.3)
Chloride: 105 mmol/L (ref 98–111)
Creatinine, Ser: 2.78 mg/dL — ABNORMAL HIGH (ref 0.61–1.24)
GFR calc Af Amer: 24 mL/min — ABNORMAL LOW (ref >60)
GFR calc non Af Amer: 20 mL/min — ABNORMAL LOW (ref >60)
GLUCOSE: 339 mg/dL — AB (ref 70–99)
Potassium: 4.7 mmol/L (ref 3.5–5.1)
SODIUM: 139 mmol/L (ref 135–145)
Total Bilirubin: 0.6 mg/dL (ref 0.3–1.2)
Total Protein: 6.7 g/dL (ref 6.5–8.1)

## 2018-09-21 LAB — BRAIN NATRIURETIC PEPTIDE: B Natriuretic Peptide: 109 pg/mL — ABNORMAL HIGH (ref 0.0–100.0)

## 2018-09-21 NOTE — ED Notes (Signed)
ED Provider at bedside. 

## 2018-09-21 NOTE — ED Triage Notes (Signed)
Present with bleeding area to left ankle, bilateral lower leg swelling  And weeping edema.

## 2018-09-21 NOTE — ED Provider Notes (Signed)
[facility] EMERGENCY DEPARTMENT Provider Note   CSN: [id number] Arrival date & time: 09/21/18  1025     History   Chief Complaint Chief Complaint  Patient presents with  . Leg Swelling    HPI Cameron [name] is a 77 y.o. male.  HPI  Has had wound on left lower leg with bleeding that started this AM after getting out of the bathtub, was bleeding for about 2 hours.  Had the wound there prior. Has had scaly skin for 18 mos, wounds that come and go.  Put pressure dressing on and bleeding stopped on arrival here.  Reports no change in leg swelling from what he has had.  No fevers, no chest pain or dyspnea.  Saw Dr. Ronnald Ramp last Friday.  Will be stopping xarelto today for dermatologic procedure.     Has been going to [facility] dermatology, Dr. Jarome Matin who prescribed a cream he has been putting on ankles twice per day, going there Wednesday to have skin cancer removal from top of head.  Past Medical History:  Diagnosis Date  . CAD (coronary artery disease)    a. 2002: s/p CABG x 4V (LIMA-LAD, seq SVG-PDA, PLA, seq SVG-OM1, OM2, SVG-Diag1)  . CKD (chronic kidney disease)   . COPD (chronic obstructive pulmonary disease) ([place])   . Diabetes mellitus without complication ([place])   . HLD (hyperlipidemia)   . Hypertension   . Obesity   . PAF (paroxysmal atrial fibrillation) ([place])   . RBBB   . 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 Severe aortic stenosis     Patient Active Problem List   Diagnosis Date Noted  . Acute respiratory failure ([place]) 02/16/2018  . HCAP (healthcare-associated pneumonia) 02/16/2018  . Sepsis ([place]) 02/16/2018  . Status post percutaneous transluminal angioplasty (PTA) 01/28/2018  . Severe claudication ([place]) 01/27/2018  . Abnormal ankle brachial index (ABI) 01/27/2018  . Diabetes ([place]) 01/25/2018  . Chronic diastolic heart failure ([place]) 12/03/2017  .  Severe aortic stenosis 12/03/2017  . CKD (chronic kidney disease) stage 4, GFR 15-29 ml/min (HCC) 12/03/2017  . 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 Obesity   . COPD (chronic obstructive pulmonary disease) ([place])   . CKD (chronic kidney disease)   . CAD (coronary artery disease)   . PAF (paroxysmal atrial fibrillation) ([place])   . RBBB     Past Surgical History:  Procedure Laterality Date  . ABDOMINAL AORTAGRAM Right 01/28/2018   LOWER EXTREMITY  . ABDOMINAL AORTOGRAM W/LOWER EXTREMITY N/A 01/28/2018   Procedure: ABDOMINAL AORTOGRAM W/LOWER EXTREMITY [place];  Surgeon: Nigel Mormon, MD;  Location: [place] CV LAB;  Service: Cardiovascular;  Laterality: N/A;  . CARDIAC SURGERY    . CORONARY BALLOON ANGIOPLASTY N/A 09/23/2017   Procedure: CORONARY BALLOON ANGIOPLASTY;  Surgeon: Nigel Mormon, MD;  Location: [place] [facility];  Service: Cardiovascular;  Laterality: N/A;  . FINGER AMPUTATION    . HERNIA REPAIR    . open heart surgery    . PERIPHERAL VASCULAR BALLOON ANGIOPLASTY  01/28/2018   Procedure: PERIPHERAL VASCULAR BALLOON ANGIOPLASTY;  Surgeon: Nigel Mormon, MD;  Location: [place] CV LAB;  Service: Cardiovascular;;  Rt SFA  . RIGHT HEART CATH AND CORONARY/GRAFT ANGIOGRAPHY N/A 09/17/2017   Procedure: RIGHT HEART CATH AND CORONARY/GRAFT ANGIOGRAPHY;  Surgeon: Nigel Mormon, MD;  Location: [place] [facility] LAB;  Service: Cardiovascular;  Laterality:  N/A;  . TEE WITHOUT CARDIOVERSION N/A 12/03/2017   Procedure: TRANSESOPHAGEAL ECHOCARDIOGRAM (TEE);  Surgeon: Sherren Mocha, MD;  Location: [place];  Service: Open Heart Surgery;  Laterality: N/A;  . TOE AMPUTATION    . TRANSCATHETER AORTIC VALVE REPLACEMENT, TRANSFEMORAL N/A 12/03/2017   Procedure: TRANSCATHETER AORTIC VALVE REPLACEMENT, TRANSFEMORAL;  Surgeon: Sherren Mocha, MD;  Location: [place];  Service: Open Heart Surgery;  Laterality: N/A;  . ULTRASOUND GUIDANCE FOR VASCULAR  ACCESS  09/17/2017   Procedure: Ultrasound Guidance For Vascular Access;  Surgeon: Nigel Mormon, MD;  Location: [place] CV LAB;  Service: Cardiovascular;;        Home Medications    Prior to Admission medications   Medication Sig Start Date End Date Taking? Authorizing Provider  amLODipine (NORVASC) 10 MG tablet Take 10 mg by mouth daily.    [provider]  ammonium lactate (AMLACTIN) 12 % lotion Apply 1 application topically as needed for dry skin. Recommend twice daily until dry skin improved 01/21/18   Landis Martins, DPM  amoxicillin (AMOXIL) 500 MG capsule Take 4 tablets (2g total) one hour prior to dental appointment Patient taking differently: Take 2,000 mg by mouth See admin instructions. Take 4 tablets (2g total) one hour prior to dental appointment 12/12/17   Eileen Stanford, PA-C  aspirin EC 81 MG EC tablet Take 1 tablet (81 mg total) by mouth daily. 12/06/17   Eileen Stanford, PA-C  citalopram (CELEXA) 10 MG tablet Take 1 tablet (10 mg total) by mouth daily. 09/25/17   Patwardhan, Reynold Bowen, MD  furosemide (LASIX) 40 MG tablet Take 1 tablet (40 mg total) by mouth 2 (two) times daily. 12/27/17 12/22/18  Sherren Mocha, MD  glucose blood [facility] VERIO) test strip 1 each by Other route 2 (two) times daily. And lancets 2/day 08/22/18   Renato Shin, MD  insulin NPH Human (NOVOLIN N RELION) 100 UNIT/ML injection Inject 0.85 mLs (85 Units total) into the skin at bedtime. 09/18/18   Renato Shin, MD  insulin regular (NOVOLIN R,HUMULIN R) 100 units/mL injection 3 times a day (just before each meal), 55-55-75 units, and syringes 4/day 06/20/18   Renato Shin, MD  isosorbide mononitrate (IMDUR) 60 MG 24 hr tablet Take 60 mg by mouth daily.    [provider]  metoprolol succinate (TOPROL-XL) 50 MG 24 hr tablet Take 50 mg by mouth daily. Take with or immediately following a meal.    [provider]  nitroGLYCERIN (NITROSTAT) 0.4 MG SL tablet Place  0.4 mg every 5 (five) minutes as needed under the tongue for chest pain.    [provider]  Rivaroxaban (XARELTO) 15 MG TABS tablet Take 1 tablet (15 mg total) by mouth daily with supper. 09/25/17   Patwardhan, Reynold Bowen, MD  rosuvastatin (CRESTOR) 40 MG tablet Take 40 mg daily after supper by mouth.     [provider]  triamcinolone (KENALOG) 0.025 % ointment Apply 1 application topically 2 (two) times daily. 04/01/18   Billie Ruddy, MD    Family History Family History  Problem Relation Age of Onset  . Diabetes Mother   . Heart disease Father   . Stomach cancer Paternal Grandfather   . Lung cancer Maternal Grandmother        smoked    Social History Social History   Tobacco Use  . Smoking status: Former Smoker    Packs/day: 2.00    Years: 20.00    Pack years: 40.00    Types:  Cigarettes    Last attempt to quit: 09/28/1978    Years since quitting: 40.0  . Smokeless tobacco: Never Used  Substance Use Topics  . Alcohol use: No  . Drug use: No     Allergies   Cefuroxime and Ezetimibe   Review of Systems Review of Systems  Constitutional: Negative for fever.  HENT: Negative for sore throat.   Eyes: Negative for visual disturbance.  Respiratory: Negative for cough (no change in chronic cough) and shortness of breath.   Cardiovascular: Negative for chest pain.  Gastrointestinal: Negative for abdominal pain.  Genitourinary: Negative for difficulty urinating.  Musculoskeletal: Positive for arthralgias. Negative for back pain and neck stiffness.  Skin: Positive for wound. Negative for rash.  Neurological: Negative for syncope, light-headedness and headaches.     Physical Exam Updated Vital Signs BP 136/76   Pulse 80   Temp 98.1 F (36.7 C) (Oral)   Resp 20   Ht 5\' 9"  (1.753 m)   Wt 101.2 kg   SpO2 97%   BMI 32.93 kg/m   Physical Exam  Constitutional: He is oriented to person, place, and time. He appears well-developed and well-nourished. No  distress.  HENT:  Head: Normocephalic and atraumatic.  Eyes: Conjunctivae and EOM are normal.  Neck: Normal range of motion.  Cardiovascular: Normal rate, regular rhythm, normal heart sounds and intact distal pulses. Exam reveals no gallop and no friction rub.  No murmur heard. Pulmonary/Chest: Effort normal and breath sounds normal. No respiratory distress. He has no wheezes. He has no rales.  Abdominal: Soft. He exhibits no distension. There is no tenderness. There is no guarding.  Musculoskeletal: He exhibits edema.  Bilateral stasis dermatitis, thick erythematous plaques on legs bilaterally Ulceration to ribht lower extremity and to mid proximal tibia Hemorrhagic vesicle to left lower extremity, suspect underlying varicose vein. Hemostatic   Neurological: He is alert and oriented to person, place, and time.  Skin: Skin is warm and dry. He is not diaphoretic.  Mass to top of head with central ulceration  Nursing note and vitals reviewed.    ED Treatments / Results  Labs (all labs ordered are listed, but only abnormal results are displayed) Labs Reviewed  CBC WITH DIFFERENTIAL/PLATELET - Abnormal; Notable for the following components:      Result Value   Platelets 137 (*)    All other components within normal limits  COMPREHENSIVE METABOLIC PANEL - Abnormal; Notable for the following components:   Glucose, Bld 339 (*)    BUN 43 (*)    Creatinine, Ser 2.78 (*)    Calcium 8.7 (*)    AST 14 (*)    GFR calc non Af Amer 20 (*)    GFR calc Af Amer 24 (*)    All other components within normal limits  BRAIN NATRIURETIC PEPTIDE - Abnormal; Notable for the following components:   B Natriuretic Peptide 109.0 (*)    All other components within normal limits    EKG EKG Interpretation  Date/Time:  Sunday September 21 2018 12:12:31 EST Ventricular Rate:  75 PR Interval:    QRS Duration: 159 QT Interval:  422 QTC Calculation: 472 R Axis:   -85 Text Interpretation:  Normal sinus  rhythm with PVCs Multiple ventricular premature complexes Right bundle branch block Inferior infarct, old Baseline wander in lead(s) V6 Since prior ECG< rate has slowed, rhythm appears to be sinus Confirmed by Gareth Morgan [phone]) on 09/21/2018 12:35:39 PM   Radiology Dg Chest 2 View  Result  Date: 09/21/2018 CLINICAL DATA:  Bilateral lower leg edema with uncontrolled lateral left ankle bleeding. History of congestive heart failure. History of chronic kidney disease, COPD and coronary artery disease. EXAM: CHEST - 2 VIEW COMPARISON:  02/18/2018 and 02/16/2018 radiographs. FINDINGS: Stable cardiomegaly status post median sternotomy, CABG and T AVR. Chronic vascular congestion and bibasilar atelectasis/scarring. No edema, confluent airspace opacity, pleural effusion or pneumothorax. No acute osseous findings are seen. IMPRESSION: Stable postoperative chest.  No acute findings. Electronically Signed   By: Richardean Sale M.D.   On: 09/21/2018 12:56    Procedures Procedures (including critical care time)  Medications Ordered in ED Medications - No data to display   Initial Impression / Assessment and Plan / ED Course  I have reviewed the triage vital signs and the nursing notes.  Pertinent labs & imaging results that were available during my care of the patient were reviewed by me and considered in my medical decision making (see chart for details).    77yo male presents with concern for bilateral lower extremity edema and bleeding from spontaneous wound. Wife placed dressing and wound hemostatic on arrival, has likely bleeding varicose vein.  Hemoglobin stable. No other significant electrolyte abnormality, no sign of acute CHF.  Rec continued outpatient follow up. Discussed wound care if he does have bleeding in area. Patient discharged in stable condition with understanding of reasons to return.   Final Clinical Impressions(s) / ED Diagnoses   Final diagnoses:  Wound of left lower  extremity, initial encounter  Chronic anticoagulation  Bleeding from wound  Peripheral edema  Bleeding from varicose vein  Stasis dermatitis of both legs    ED Discharge Orders    None       Gareth Morgan, MD 09/21/18 2340

## 2018-09-23 ENCOUNTER — Telehealth: Payer: Self-pay

## 2018-09-23 NOTE — Telephone Encounter (Signed)
Pt is requesting for Advise on if he should get a PNA vaccine since he had pneumonia last year. Pt has had none of PNA vaccine yet. Please advise

## 2018-09-23 NOTE — Telephone Encounter (Signed)
Pt should consider it given his health conditions.  He can get it at the pharmacy or in the office.

## 2018-09-24 NOTE — Telephone Encounter (Signed)
Pt scheduled for the PNA vaccine on 10/01/2018 at 9 am

## 2018-10-01 ENCOUNTER — Ambulatory Visit (INDEPENDENT_AMBULATORY_CARE_PROVIDER_SITE_OTHER): Payer: Medicare Other | Admitting: *Deleted

## 2018-10-01 DIAGNOSIS — Z23 Encounter for immunization: Secondary | ICD-10-CM | POA: Diagnosis not present

## 2018-10-01 NOTE — Progress Notes (Signed)
Per orders of Dr. Volanda Napoleon, injection of Prevnar 13 given by Westley Hummer. Patient tolerated injection well.

## 2018-11-24 ENCOUNTER — Encounter: Payer: Self-pay | Admitting: Endocrinology

## 2018-11-24 ENCOUNTER — Ambulatory Visit: Payer: Medicare Other | Admitting: Endocrinology

## 2018-11-24 VITALS — BP 158/64 | HR 82 | Ht 69.0 in | Wt 224.8 lb

## 2018-11-24 DIAGNOSIS — Z794 Long term (current) use of insulin: Secondary | ICD-10-CM

## 2018-11-24 DIAGNOSIS — N184 Chronic kidney disease, stage 4 (severe): Secondary | ICD-10-CM

## 2018-11-24 DIAGNOSIS — E1122 Type 2 diabetes mellitus with diabetic chronic kidney disease: Secondary | ICD-10-CM

## 2018-11-24 LAB — POCT GLYCOSYLATED HEMOGLOBIN (HGB A1C): Hemoglobin A1C: 9.3 % — AB (ref 4.0–5.6)

## 2018-11-24 MED ORDER — INSULIN NPH (HUMAN) (ISOPHANE) 100 UNIT/ML ~~LOC~~ SUSP: 200.0000 [IU] | SUBCUTANEOUS | 11 refills | Status: DC

## 2018-11-24 NOTE — Progress Notes (Signed)
Subjective:    Patient ID: Cameron [name], male    DOB: 08/14/1941, 78 y.o.   MRN: [id number]  HPI Pt returns for f/u of diabetes mellitus: DM type: Insulin-requiring type 2 Dx'ed: 1308 Complications: renal failure, PAD, and CAD. Therapy: insulin since 2010 DKA: never Severe hypoglycemia: never. Pancreatitis: never.  Pancreatic imaging: normal on 2019 CT.  Other: he takes multiple daily injections; he takes human insulin, due to cost.   Interval history: pt says he seldom checks cbg.  He says when he does, he checks fasting.  It is approx 200.  pt states he feels well in general.  He says he never misses the insulin.   Past Medical History:  Diagnosis Date  . CAD (coronary artery disease)    a. 2002: s/p CABG x 4V (LIMA-LAD, seq SVG-PDA, PLA, seq SVG-OM1, OM2, SVG-Diag1)  . CKD (chronic kidney disease)   . COPD (chronic obstructive pulmonary disease) ([place])   . Diabetes mellitus without complication ([place])   . HLD (hyperlipidemia)   . Hypertension   . Obesity   . PAF (paroxysmal atrial fibrillation) ([place])   . RBBB   . 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 Severe aortic stenosis     Past Surgical History:  Procedure Laterality Date  . ABDOMINAL AORTAGRAM Right 01/28/2018   LOWER EXTREMITY  . ABDOMINAL AORTOGRAM W/LOWER EXTREMITY N/A 01/28/2018   Procedure: ABDOMINAL AORTOGRAM W/LOWER EXTREMITY [place];  Surgeon: Nigel Mormon, MD;  Location: [place] [facility];  Service: Cardiovascular;  Laterality: N/A;  . CARDIAC SURGERY    . CORONARY BALLOON ANGIOPLASTY N/A 09/23/2017   Procedure: CORONARY BALLOON ANGIOPLASTY;  Surgeon: Nigel Mormon, MD;  Location: [place] [facility] LAB;  Service: Cardiovascular;  Laterality: N/A;  . FINGER AMPUTATION    . HERNIA REPAIR    . open heart surgery    . PERIPHERAL VASCULAR BALLOON ANGIOPLASTY  01/28/2018   Procedure: PERIPHERAL  VASCULAR BALLOON ANGIOPLASTY;  Surgeon: Nigel Mormon, MD;  Location: [place] [facility] LAB;  Service: Cardiovascular;;  Rt SFA  . RIGHT HEART CATH AND CORONARY/GRAFT ANGIOGRAPHY N/A 09/17/2017   Procedure: RIGHT HEART CATH AND CORONARY/GRAFT ANGIOGRAPHY;  Surgeon: Nigel Mormon, MD;  Location: [place] [facility];  Service: Cardiovascular;  Laterality: N/A;  . TEE WITHOUT CARDIOVERSION N/A 12/03/2017   Procedure: TRANSESOPHAGEAL ECHOCARDIOGRAM (TEE);  Surgeon: Sherren Mocha, MD;  Location: [facility];  Service: Open Heart Surgery;  Laterality: N/A;  . TOE AMPUTATION    . TRANSCATHETER AORTIC VALVE REPLACEMENT, TRANSFEMORAL N/A 12/03/2017   Procedure: TRANSCATHETER AORTIC VALVE REPLACEMENT, TRANSFEMORAL;  Surgeon: Sherren Mocha, MD;  Location: [place];  Service: Open Heart Surgery;  Laterality: N/A;  . ULTRASOUND GUIDANCE FOR VASCULAR ACCESS  09/17/2017   Procedure: Ultrasound Guidance For Vascular Access;  Surgeon: Nigel Mormon, MD;  Location: [place] [facility];  Service: Cardiovascular;;    Social History   Socioeconomic History  . Marital status: Married    Spouse name: Not on file  . Number of children: 2  . Years of education: Not on file  . Highest education level: Not on file  Occupational History  . Occupation: Doctor, general practice  Social Needs  . Financial resource strain: Not on file  . Food insecurity:    Worry: Not on file    Inability: Not on file  . Transportation needs:    Medical: Not on file  Non-medical: Not on file  Tobacco Use  . Smoking status: Former Smoker    Packs/day: 2.00    Years: 20.00    Pack years: 40.00    Types: Cigarettes    Last attempt to quit: 09/28/1978    Years since quitting: 40.1  . Smokeless tobacco: Never Used  Substance and Sexual Activity  . Alcohol use: No  . Drug use: No  . Sexual activity: Not on file  Lifestyle  . Physical activity:    Days per week: Not on file    Minutes per session: Not on file  .  Stress: Not on file  Relationships  . Social connections:    Talks on phone: Not on file    Gets together: Not on file    Attends religious service: Not on file    Active member of club or organization: Not on file    Attends meetings of clubs or organizations: Not on file    Relationship status: Not on file  . Intimate partner violence:    Fear of current or ex partner: Not on file    Emotionally abused: Not on file    Physically abused: Not on file    Forced sexual activity: Not on file  Other Topics Concern  . Not on file  Social History Narrative  . Not on file    Current Outpatient Medications on File Prior to Visit  Medication Sig Dispense Refill  . amLODipine (NORVASC) 10 MG tablet Take 10 mg by mouth daily.    [place] Kitchen aspirin EC 81 MG EC tablet Take 1 tablet (81 mg total) by mouth daily.    . furosemide (LASIX) 40 MG tablet Take 1 tablet (40 mg total) by mouth 2 (two) times daily. 60 tablet 11  . glucose blood (ONETOUCH VERIO) test strip 1 each by Other route 2 (two) times daily. And lancets 2/day 100 each 12  . metoprolol succinate (TOPROL-XL) 50 MG 24 hr tablet Take 50 mg by mouth daily. Take with or immediately following a meal.    . nitroGLYCERIN (NITROSTAT) 0.4 MG SL tablet Place 0.4 mg every 5 (five) minutes as needed under the tongue for chest pain.    . Rivaroxaban (XARELTO) 15 MG TABS tablet Take 1 tablet (15 mg total) by mouth daily with supper. (Patient taking differently: Take 15 mg by mouth 2 (two) times daily with a meal. ) 60 tablet 3  . rosuvastatin (CRESTOR) 40 MG tablet Take 40 mg daily after supper by mouth.      No current facility-administered medications on file prior to visit.     Allergies  Allergen Reactions  . Cefuroxime Diarrhea  . Ezetimibe Diarrhea    Family History  Problem Relation Age of Onset  . Diabetes Mother   . Heart disease Father   . Stomach cancer Paternal Grandfather   . Lung cancer Maternal Grandmother        smoked    BP  (!) 158/64 (BP Location: Left Arm, Patient Position: Sitting, Cuff Size: Large) Comment: did not take antihypertensive  Pulse 82   Ht 5\' 9"  (1.753 m)   Wt 224 lb 12.8 oz (102 kg)   SpO2 92%   BMI 33.20 kg/m    Review of Systems He denies hypoglycemia.      Objective:   Physical Exam VITAL SIGNS:  See vs page GENERAL: no distress Pulses: foot pulses are intact bilaterally.   MSK: right 4th toe is surgically absent.  CV: 1+ bilat  edema of the legs.  Skin:  no ulcer on the feet or ankles.  Old healed surgical scar at the right foot.  Chronic erythema of the legs--no change.  normal temp on the feet and ankles.   Neuro: sensation is intact to touch on the feet and ankles.   Ext: There is severe bilateral onychomycosis of the toenails.     Lab Results  Component Value Date   HGBA1C 9.3 (A) 11/24/2018   Lab Results  Component Value Date   CREATININE 2.78 (H) 09/21/2018   BUN 43 (H) 09/21/2018   NA 139 09/21/2018   K 4.7 09/21/2018   CL 105 09/21/2018   CO2 26 09/21/2018      Assessment & Plan:  Insulin-requiring type 2 DM, with PAD: worse Renal failure: he may need a faster-release qd insulin.  Right now, we'll reduce the dosage for safety.  Patient Instructions  Your blood pressure is high today.  Please see your primary care provider soon, to have it rechecked.   check your blood sugar twice a day.  vary the time of day when you check, between before the 3 meals, and at bedtime.  also check if you have symptoms of your blood sugar being too high or too low.  please keep a record of the readings and bring it to your next appointment here (or you can bring the meter itself).  You can write it on any piece of paper.  please call us sooner if your blood sugar goes below 70, or if you have a lot of readings over 200.   Please stop taking the regular insulin, and:  Please increase the NPH to 200 units each morning.  Please come back for a follow-up appointment in 2 months.

## 2018-11-24 NOTE — Patient Instructions (Addendum)
Your blood pressure is high today.  Please see your primary care provider soon, to have it rechecked.   check your blood sugar twice a day.  vary the time of day when you check, between before the 3 meals, and at bedtime.  also check if you have symptoms of your blood sugar being too high or too low.  please keep a record of the readings and bring it to your next appointment here (or you can bring the meter itself).  You can write it on any piece of paper.  please call us sooner if your blood sugar goes below 70, or if you have a lot of readings over 200.   Please stop taking the regular insulin, and:  Please increase the NPH to 200 units each morning.  Please come back for a follow-up appointment in 2 months.

## 2018-12-01 ENCOUNTER — Telehealth: Payer: Self-pay | Admitting: Endocrinology

## 2018-12-01 NOTE — Telephone Encounter (Signed)
Patient called re: patient requests to come in to our office today (12/01/18) or tomorrow (12/02/18) to pick up a new One Touch Verio Meter and also requests that the above meter be programmed to tell patient what time and date patient is testing.Please call patient at ph# [phone] to let him know when programmed meter is ready for him to pick up, per patient's request.

## 2018-12-01 NOTE — Telephone Encounter (Signed)
Do you have time to complete pt education with this patient?

## 2018-12-03 NOTE — Telephone Encounter (Signed)
Message left with wife that I am available today at 1PM and 4:30.  She will let him know and call me back.

## 2018-12-16 ENCOUNTER — Telehealth: Payer: Self-pay

## 2018-12-16 NOTE — Progress Notes (Signed)
Patient is here for follow up visit.  Subjective:   @Patient  ID: Cameron [name], male    DOB: [birth year], 78 y.o.   MRN: [id number]  Chief Complaint  Patient presents with  . Shortness of Breath  . Follow-up  . Fatigue    HPI  78 y/o Caucasian male with severe aortic stenosis s/p TAVR 26 mm Edwards Sapien 3 on 12/03/2017, CAD s/p CABG (LIMA-LAD, seq SVG-PDA, PLA, seq SVG-OM1, OM2, SVG-Diag1), PAD s/p PTA Rt prox SFA 12/2017, paroxysmal Afib, hypertension, uncontrolled type 2 DM, advanced CKD, moderate grp II pulmonary hypertension.  Patient was last seen by me on 12/16/2018. He called yesterday with complaints of chest pain and requested to be seen.   Patient is here today with his wife and daughter.  He had one episode of retrosternal chest pain while raking leaves last week.  Since then he has been afraid to do any physical activity.  He took 1 sublingual nitroglycerin that resolved the pain.  He currently does not use any long-acting nitrate.  Patient states he is afraid of being alone if he were to have a heart attack.  She understands complexity of his medical problems.  He is reluctant to see nephrologist and his form on his wishes of not wanting to consider dialysis.  Past Medical History:  Diagnosis Date  . CAD (coronary artery disease)    a. 2002: s/p CABG x 4V (LIMA-LAD, seq SVG-PDA, PLA, seq SVG-OM1, OM2, SVG-Diag1)  . CKD (chronic kidney disease)   . COPD (chronic obstructive pulmonary disease) ([place])   . Diabetes mellitus without complication ([place])   . HLD (hyperlipidemia)   . Hypertension   . Obesity   . PAF (paroxysmal atrial fibrillation) ([place])   . RBBB   . 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 Severe aortic stenosis     Past Surgical History:  Procedure Laterality Date  . ABDOMINAL AORTAGRAM Right 01/28/2018   LOWER EXTREMITY  . ABDOMINAL AORTOGRAM  W/LOWER EXTREMITY N/A 01/28/2018   Procedure: ABDOMINAL AORTOGRAM W/LOWER EXTREMITY [place];  Surgeon: Nigel Mormon, MD;  Location: [facility];  Service: Cardiovascular;  Laterality: N/A;  . CARDIAC SURGERY    . CORONARY BALLOON ANGIOPLASTY N/A 09/23/2017   Procedure: CORONARY BALLOON ANGIOPLASTY;  Surgeon: Nigel Mormon, MD;  Location: [facility];  Service: Cardiovascular;  Laterality: N/A;  . FINGER AMPUTATION    . HERNIA REPAIR    . open heart surgery    . PERIPHERAL VASCULAR BALLOON ANGIOPLASTY  01/28/2018   Procedure: PERIPHERAL VASCULAR BALLOON ANGIOPLASTY;  Surgeon: Nigel Mormon, MD;  Location: [facility];  Service: Cardiovascular;;  Rt SFA  . RIGHT HEART CATH AND CORONARY/GRAFT ANGIOGRAPHY N/A 09/17/2017   Procedure: RIGHT HEART CATH AND CORONARY/GRAFT ANGIOGRAPHY;  Surgeon: Nigel Mormon, MD;  Location: [facility];  Service: Cardiovascular;  Laterality: N/A;  . TEE WITHOUT CARDIOVERSION N/A 12/03/2017   Procedure: TRANSESOPHAGEAL ECHOCARDIOGRAM (TEE);  Surgeon: Sherren Mocha, MD;  Location: [facility];  Service: Open Heart Surgery;  Laterality: N/A;  . TOE AMPUTATION    . TRANSCATHETER AORTIC VALVE REPLACEMENT, TRANSFEMORAL N/A 12/03/2017   Procedure: TRANSCATHETER AORTIC VALVE REPLACEMENT, TRANSFEMORAL;  Surgeon: Sherren Mocha, MD;  Location: [facility];  Service: Open Heart Surgery;  Laterality: N/A;  . ULTRASOUND GUIDANCE FOR VASCULAR ACCESS  09/17/2017   Procedure: Ultrasound Guidance For Vascular Access;  Surgeon: Nigel Mormon, MD;  Location: [facility];  Service: Cardiovascular;;    Social History   Socioeconomic History  . Marital status: Married    Spouse name: Not on file  . Number of children: 2  . Years of education: Not on file  . Highest education level: Not on file  Occupational History  . Occupation: Doctor, general practice  Social Needs  . Financial resource strain: Not on file  . Food  insecurity:    Worry: Not on file    Inability: Not on file  . Transportation needs:    Medical: Not on file    Non-medical: Not on file  Tobacco Use  . Smoking status: Former Smoker    Packs/day: 2.00    Years: 20.00    Pack years: 40.00    Types: Cigarettes    Last attempt to quit: 09/28/1978    Years since quitting: 40.2  . Smokeless tobacco: Never Used  Substance and Sexual Activity  . Alcohol use: No  . Drug use: No  . Sexual activity: Not on file  Lifestyle  . Physical activity:    Days per week: Not on file    Minutes per session: Not on file  . Stress: Not on file  Relationships  . Social connections:    Talks on phone: Not on file    Gets together: Not on file    Attends religious service: Not on file    Active member of club or organization: Not on file    Attends meetings of clubs or organizations: Not on file    Relationship status: Not on file  . Intimate partner violence:    Fear of current or ex partner: Not on file    Emotionally abused: Not on file    Physically abused: Not on file    Forced sexual activity: Not on file  Other Topics Concern  . Not on file  Social History Narrative  . Not on file    Current Outpatient Medications on File Prior to Visit  Medication Sig Dispense Refill  . amLODipine (NORVASC) 10 MG tablet Take 10 mg by mouth daily.    Marland Kitchen aspirin EC 81 MG EC tablet Take 1 tablet (81 mg total) by mouth daily.    . furosemide (LASIX) 40 MG tablet Take 1 tablet (40 mg total) by mouth 2 (two) times daily. 60 tablet 11  . glucose blood (ONETOUCH VERIO) test strip 1 each by Other route 2 (two) times daily. And lancets 2/day 100 each 12  . insulin NPH Human (NOVOLIN N RELION) 100 UNIT/ML injection Inject 2 mLs (200 Units total) into the skin every morning. And syringes 2/day 70 mL 11  . metoprolol succinate (TOPROL-XL) 50 MG 24 hr tablet Take 50 mg by mouth daily. Take with or immediately following a meal.    . nitroGLYCERIN (NITROSTAT) 0.4 MG  SL tablet Place 0.4 mg every 5 (five) minutes as needed under the tongue for chest pain.    . Rivaroxaban (XARELTO) 15 MG TABS tablet Take 1 tablet (15 mg total) by mouth daily with supper. (Patient taking differently: Take 15 mg by mouth 2 (two) times daily with a meal. ) 60 tablet 3  . rosuvastatin (CRESTOR) 40 MG tablet Take 40 mg daily after supper by mouth.      No current facility-administered medications on file prior to visit.     Cardiovascular studies:  Hospital echocardiogram 12/27/2017: - Left ventricle: The cavity size was normal.  Wall thickness was   increased in a pattern of moderate LVH. Systolic function was   normal. The estimated ejection fraction was in the range of 60%   to 65%. Wall motion was normal; there were no regional wall   motion abnormalities. Doppler parameters are consistent with   abnormal left ventricular relaxation (grade 1 diastolic   dysfunction). - Aortic valve: Bioprosthetic aortic valve s/p TAVR. No significant   bioprosthetic valve stenosis. No regurgitation noted. Mean   gradient (S): 13 mm Hg. - Mitral valve: Mildly to moderately calcified annulus. Moderately   calcified leaflets . There was trivial regurgitation. - Right ventricle: The cavity size was normal. Systolic function   was normal. - Tricuspid valve: Peak RV-RA gradient (S): 17 mm Hg. - Pulmonary arteries: PA peak pressure: 20 mm Hg (S). - Inferior vena cava: The vessel was normal in size. The   respirophasic diameter changes were in the normal range (>= 50%),   consistent with normal central venous pressure.  Impressions:  - Normal LV size with moderate LV hypertrophy. EF 60-65%. Normal RV   size and systolic function. Bioprosthetic aortic valve appeared   to function normally. Lexiscan myoview stress test 01/24/2018:  1. Pharmacologic stress testing was performed with intravenous administration of .4 mg of Lexiscan over a 10-15 seconds infusion. Peak BP 162/78 mmHg. Stress  symptoms included nausea. Exercise capacity not assessed.  Stress EKG is non diagnostic for ischemia as it is a pharmacologic stress.  2. The overall quality of the study is good. There is no evidence of abnormal lung activity. Stress and rest SPECT images demonstrate homogeneous tracer distribution throughout the myocardium, with physiological apical thinning. Gated SPECT imaging reveals normal myocardial thickening and wall motion. The left ventricular ejection fraction was normal (53%).   3. Low risk study.  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  Left ICA 40-59% stenosis. Both the lower end of scale.  Both vertebral arteries patent with antegrade flow.  ABI 02/05/2018: This exam reveals normal perfusion of the right lower extremity with mildly abnormal (biphasic) waveform (ABI 0.97) and mildly decreased perfusion of the left lower extremity, with mildly abnormal (biphasic) waveform noted at the post tibial artery level (ABI).  Compared to 01/15/2018, Rt ABI 0.66 Lt ABI 0.77, study suggests successful revascularization.  Review of Systems  Constitution: Positive for malaise/fatigue. Negative for decreased appetite, weight gain and weight loss.  HENT: Negative for congestion.   Eyes: Negative for visual disturbance.  Cardiovascular: Positive for chest pain, dyspnea on exertion (Stable) and leg swelling. Negative for palpitations and syncope.  Respiratory: Positive for shortness of breath.   Endocrine: Negative for cold intolerance.  Hematologic/Lymphatic: Does not bruise/bleed easily.  Skin: Positive for skin cancer (S/p surgeries on forehad). Negative for itching and rash.  Musculoskeletal: Negative for myalgias.  Gastrointestinal: Negative for abdominal pain, nausea and vomiting.  Genitourinary: Negative for dysuria.  Neurological: Negative for dizziness and weakness.  Psychiatric/Behavioral: Positive for depression. The patient is not nervous/anxious.   All  other systems reviewed and are negative.      Objective:   There were no vitals filed for this visit.   Physical Exam  Constitutional: He is oriented to person, place, and time. He appears well-developed and well-nourished. No distress.  HENT:  Head: Normocephalic and atraumatic.  Eyes: Pupils are equal, round, and reactive to light. Conjunctivae are normal.  Neck: No JVD present.  Cardiovascular: Normal rate, regular rhythm and intact distal pulses.  Murmur (I/VI RUSB.  Stable) heard. Pulses:      Carotid pulses are on the right side with bruit and on the left side with bruit.      Dorsalis pedis pulses are 1+ on the right side and 1+ on the left side.       Posterior tibial pulses are 0 on the right side and 0 on the left side.  Pulmonary/Chest: Effort normal and breath sounds normal. He has no wheezes. He has no rales.  Abdominal: Soft. Bowel sounds are normal. There is no rebound.  Musculoskeletal:        General: Edema (1+ b/l, nonpitting, stable) present.  Lymphadenopathy:    He has no cervical adenopathy.  Neurological: He is alert and oriented to person, place, and time. No cranial nerve deficit.  Skin: Skin is warm and dry.  Psychiatric: He has a normal mood and affect.  Nursing note and vitals reviewed.       Assessment & Recommendations:    78 y/o Caucasian male with severe aortic stenosis s/p TAVR 26 mm Edwards Sapien 3 on 12/03/2017, CAD s/p CABG (LIMA-LAD, seq SVG-PDA, PLA, seq SVG-OM1, OM2, SVG-Diag1), PAD s/p PTA Rt prox SFA 12/2017, paroxysmal Afib, hypertension, uncontrolled type 2 DM, advanced CKD, moderate grp II pulmonary hypertension.  1. Coronary artery disease of bypass graft of native heart with stable angina pectoris ([place]) Known complex coronary artery bypass graft disease.  New symptoms of stable angina noted.  Options are limited due to his advanced CKD.  Patient does not want to consider dialysis.  Recommend medical management.  Continue amlodipine  10 mg daily, and metoprolol succinate 50 mg daily.  Added isosorbide mononitrate 30 mg daily.  Continue sublingual nitroglycerin as needed.  2. S/P TAVR (transcatheter aortic valve replacement) Well-functioning.  3. PAF (paroxysmal atrial fibrillation) ([facility]) Continue Eliquis 5 mg twice daily.  Anticipate reduction in dose in future based on age, to 2.5 mg twice daily.  4. Bilateral carotid stenosis: Stable. Carotid US next month.   5. CKD (chronic kidney disease) stage 4, GFR 15-29 ml/min ([facility]) Patient does not want to consider dialysis and is reluctant to see a nephrologist.   6. Depression, unspecified depression type I suspect underlying depression emanating from complex medical illnesses is causing significant burden.I have encouraged him to discuss this with his PCP Dr. Volanda Napoleon  I will see him back in 4 weeks.    Nigel Mormon, MD [facility] Pager: [phone] Office: [phone] If no answer Cell [phone]

## 2018-12-16 NOTE — Telephone Encounter (Signed)
Pt's daughter called saying her father has been experiencing chest pain and discomfort and would like to come in to be seen ASAP. Advised her to take him to the hospital if chest pain intensifies and he begins to have symptoms of a heart attack. Asked daughter if Pt was experiencing chest pain today, and she said no.   Please Advise.

## 2018-12-17 ENCOUNTER — Encounter: Payer: Self-pay | Admitting: Cardiology

## 2018-12-17 ENCOUNTER — Ambulatory Visit: Payer: Medicare Other | Admitting: Cardiology

## 2018-12-17 VITALS — BP 121/54 | HR 82 | Ht 65.0 in | Wt 226.5 lb

## 2018-12-17 DIAGNOSIS — I6523 Occlusion and stenosis of bilateral carotid arteries: Secondary | ICD-10-CM

## 2018-12-17 DIAGNOSIS — I480 Paroxysmal atrial fibrillation: Secondary | ICD-10-CM

## 2018-12-17 DIAGNOSIS — I25708 Atherosclerosis of coronary artery bypass graft(s), unspecified, with other forms of angina pectoris: Secondary | ICD-10-CM

## 2018-12-17 DIAGNOSIS — Z952 Presence of prosthetic heart valve: Secondary | ICD-10-CM | POA: Diagnosis not present

## 2018-12-17 DIAGNOSIS — N184 Chronic kidney disease, stage 4 (severe): Secondary | ICD-10-CM

## 2018-12-17 DIAGNOSIS — F32A Depression, unspecified: Secondary | ICD-10-CM | POA: Insufficient documentation

## 2018-12-17 DIAGNOSIS — F329 Major depressive disorder, single episode, unspecified: Secondary | ICD-10-CM

## 2018-12-17 MED ORDER — ISOSORBIDE MONONITRATE ER 30 MG PO TB24: 30.0000 mg | ORAL_TABLET | Freq: Every day | ORAL | 11 refills | Status: DC

## 2018-12-18 ENCOUNTER — Other Ambulatory Visit: Payer: Self-pay

## 2018-12-18 ENCOUNTER — Encounter: Payer: Self-pay | Admitting: Cardiology

## 2018-12-18 DIAGNOSIS — I5032 Chronic diastolic (congestive) heart failure: Secondary | ICD-10-CM

## 2018-12-18 MED ORDER — APIXABAN 5 MG PO TABS: 5.0000 mg | ORAL_TABLET | Freq: Two times a day (BID) | ORAL | 2 refills | Status: DC

## 2018-12-18 MED ORDER — APIXABAN 5 MG PO TABS: 5.0000 mg | ORAL_TABLET | Freq: Two times a day (BID) | ORAL | 2 refills | Status: AC

## 2018-12-25 ENCOUNTER — Other Ambulatory Visit: Payer: Self-pay | Admitting: Cardiology

## 2018-12-25 ENCOUNTER — Telehealth: Payer: Self-pay | Admitting: Radiology

## 2018-12-25 DIAGNOSIS — Z952 Presence of prosthetic heart valve: Secondary | ICD-10-CM

## 2018-12-25 NOTE — Telephone Encounter (Signed)
Called pt regarding his eliquis patient assistance awaiting call back

## 2018-12-25 NOTE — Telephone Encounter (Signed)
Patient called & scheduled the echocardiogram (per Dr Virgina Jock).  He also needs to speak with someone regarding his Eliquis.  He says he has completed the paperwork but keeps getting the "runaround".

## 2019-01-14 NOTE — Progress Notes (Addendum)
Patient is here for follow up visit.  Subjective:   @Patient  ID: Cameron [name], male    DOB: 1941/04/21, 78 y.o.   MRN: [id number]  No chief complaint on file.   HPI   78 y/o Caucasian male with severe aortic stenosis s/p TAVR 26 mm Edwards Sapien 3 on 12/03/2017, CAD s/p CABG (LIMA-LAD, seq SVG-PDA, PLA, seq SVG-OM1, OM2, SVG-Diag1), PAD s/p PTA Rt prox SFA 12/2017, paroxysmal Afib, hypertension, uncontrolled type 2 DM, advanced CKD, moderate grp II pulmonary hypertension.  Patient was last seen by me on 12/17/2018. I had increased his Imdur to 60 mg due to his episodes of stable angina.   He has not actually been taking Imdur due to nausea. However, he has not had any more chest pain since that. He has stable exertional dyspnea. He continues to have generalized fatigue symptoms.   Past Medical History:  Diagnosis Date  . CAD (coronary artery disease)    a. 2002: s/p CABG x 4V (LIMA-LAD, seq SVG-PDA, PLA, seq SVG-OM1, OM2, SVG-Diag1)  . CKD (chronic kidney disease)   . COPD (chronic obstructive pulmonary disease) ([place])   . Diabetes mellitus without complication ([place])   . HLD (hyperlipidemia)   . Hypertension   . Obesity   . PAF (paroxysmal atrial fibrillation) ([place])   . RBBB   . 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 Severe aortic stenosis     Past Surgical History:  Procedure Laterality Date  . ABDOMINAL AORTAGRAM Right 01/28/2018   LOWER EXTREMITY  . ABDOMINAL AORTOGRAM W/LOWER EXTREMITY N/A 01/28/2018   Procedure: ABDOMINAL AORTOGRAM W/LOWER EXTREMITY [place];  Surgeon: Nigel Mormon, MD;  Location: [facility];  Service: Cardiovascular;  Laterality: N/A;  . CARDIAC SURGERY    . CORONARY BALLOON ANGIOPLASTY N/A 09/23/2017   Procedure: CORONARY BALLOON ANGIOPLASTY;  Surgeon: Nigel Mormon, MD;  Location: [facility];  Service:  Cardiovascular;  Laterality: N/A;  . FINGER AMPUTATION    . HERNIA REPAIR    . open heart surgery    . PERIPHERAL VASCULAR BALLOON ANGIOPLASTY  01/28/2018   Procedure: PERIPHERAL VASCULAR BALLOON ANGIOPLASTY;  Surgeon: Nigel Mormon, MD;  Location: [facility];  Service: Cardiovascular;;  Rt SFA  . RIGHT HEART CATH AND CORONARY/GRAFT ANGIOGRAPHY N/A 09/17/2017   Procedure: RIGHT HEART CATH AND CORONARY/GRAFT ANGIOGRAPHY;  Surgeon: Nigel Mormon, MD;  Location: [facility];  Service: Cardiovascular;  Laterality: N/A;  . TEE WITHOUT CARDIOVERSION N/A 12/03/2017   Procedure: TRANSESOPHAGEAL ECHOCARDIOGRAM (TEE);  Surgeon: Sherren Mocha, MD;  Location: [facility];  Service: Open Heart Surgery;  Laterality: N/A;  . TOE AMPUTATION    . TRANSCATHETER AORTIC VALVE REPLACEMENT, TRANSFEMORAL N/A 12/03/2017   Procedure: TRANSCATHETER AORTIC VALVE REPLACEMENT, TRANSFEMORAL;  Surgeon: Sherren Mocha, MD;  Location: [facility];  Service: Open Heart Surgery;  Laterality: N/A;  . ULTRASOUND GUIDANCE FOR VASCULAR ACCESS  09/17/2017   Procedure: Ultrasound Guidance For Vascular Access;  Surgeon: Nigel Mormon, MD;  Location: [facility];  Service: Cardiovascular;;    Social History   Socioeconomic History  . Marital status: Married    Spouse name: Not on file  . Number of children: 2  . Years of education: Not on file  . Highest education level: Not on file  Occupational History  . Occupation: Doctor, general practice  Social Needs  . Emergency planning/management officer  strain: Not on file  . Food insecurity:    Worry: Not on file    Inability: Not on file  . Transportation needs:    Medical: Not on file    Non-medical: Not on file  Tobacco Use  . Smoking status: Former Smoker    Packs/day: 2.00    Years: 20.00    Pack years: 40.00    Types: Cigarettes    Last attempt to quit: 09/28/1978    Years since quitting: 40.3  . Smokeless tobacco: Never Used  Substance and Sexual  Activity  . Alcohol use: No  . Drug use: No  . Sexual activity: Not on file  Lifestyle  . Physical activity:    Days per week: Not on file    Minutes per session: Not on file  . Stress: Not on file  Relationships  . Social connections:    Talks on phone: Not on file    Gets together: Not on file    Attends religious service: Not on file    Active member of club or organization: Not on file    Attends meetings of clubs or organizations: Not on file    Relationship status: Not on file  . Intimate partner violence:    Fear of current or ex partner: Not on file    Emotionally abused: Not on file    Physically abused: Not on file    Forced sexual activity: Not on file  Other Topics Concern  . Not on file  Social History Narrative  . Not on file    Current Outpatient Medications on File Prior to Visit  Medication Sig Dispense Refill  . amLODipine (NORVASC) 10 MG tablet Take 10 mg by mouth daily.    Marland Kitchen apixaban (ELIQUIS) 5 MG TABS tablet Take 1 tablet (5 mg total) by mouth 2 (two) times daily. 180 tablet 2  . aspirin EC 81 MG EC tablet Take 1 tablet (81 mg total) by mouth daily.    . furosemide (LASIX) 40 MG tablet Take 1 tablet (40 mg total) by mouth 2 (two) times daily. (Patient taking differently: Take 40 mg by mouth daily. ) 60 tablet 11  . glucose blood (ONETOUCH VERIO) test strip 1 each by Other route 2 (two) times daily. And lancets 2/day (Patient not taking: Reported on 12/17/2018) 100 each 12  . insulin NPH Human (NOVOLIN N RELION) 100 UNIT/ML injection Inject 2 mLs (200 Units total) into the skin every morning. And syringes 2/day 70 mL 11  . isosorbide mononitrate (IMDUR) 30 MG 24 hr tablet Take 1 tablet (30 mg total) by mouth daily. 30 tablet 11  . metoprolol succinate (TOPROL-XL) 50 MG 24 hr tablet Take 50 mg by mouth daily. Take with or immediately following a meal.     . nitroGLYCERIN (NITROSTAT) 0.4 MG SL tablet Place 0.4 mg every 5 (five) minutes as needed under the  tongue for chest pain.    . rosuvastatin (CRESTOR) 40 MG tablet Take 40 mg daily after supper by mouth.      No current facility-administered medications on file prior to visit.     Cardiovascular studies:  Echocardiogram 01/15/2019: Left ventricle cavity is normal in size. Moderate concentric hypertrophy of the left ventricle. Normal global wall motion. Diastolic function could not be evaluated due to atrial fibrillation. Calculated EF 63%. Well functioning bioprosthetic TAVR valve with no significant regurgitation. Mean PG 12 mmHg which is likely normal unchanged compared to 12/2017.  Mild calcification of the mitral  valve annulus and leaflets. Mild restriction without significant stenosis. Moderate (Grade II) mitral regurgitation. Moderate tricuspid regurgitation. Estimated pulmonary artery systolic pressure 52 mmHg. Compared to previous study on 12/27/2017, mitral and tricuspid regurgitation is increased in severity. Pulmonary hypertension is new.   Lexiscan myoview stress test 01/24/2018:  1. Pharmacologic stress testing was performed with intravenous administration of .4 mg of Lexiscan over a 10-15 seconds infusion. Peak BP 162/78 mmHg. Stress symptoms included nausea. Exercise capacity not assessed.  Stress EKG is non diagnostic for ischemia as it is a pharmacologic stress.  2. The overall quality of the study is good. There is no evidence of abnormal lung activity. Stress and rest SPECT images demonstrate homogeneous tracer distribution throughout the myocardium, with physiological apical thinning. Gated SPECT imaging reveals normal myocardial thickening and wall motion. The left ventricular ejection fraction was normal (53%).   3. Low risk study.  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  Left ICA 40-59% stenosis. Both the lower end of scale.  Both vertebral arteries patent with antegrade flow.  ABI 02/05/2018: This exam reveals normal perfusion of the  right lower extremity with mildly abnormal (biphasic) waveform (ABI 0.97) and mildly decreased perfusion of the left lower extremity, with mildly abnormal (biphasic) waveform noted at the post tibial artery level (ABI).  Compared to 01/15/2018, Rt ABI 0.66 Lt ABI 0.77, study suggests successful revascularization.  Review of Systems  Constitution: Positive for malaise/fatigue. Negative for decreased appetite, weight gain and weight loss.  HENT: Negative for congestion.   Eyes: Negative for visual disturbance.  Cardiovascular: Positive for dyspnea on exertion (Stable) and leg swelling (Improved). Negative for chest pain, palpitations and syncope.  Respiratory: Positive for shortness of breath.   Endocrine: Negative for cold intolerance.  Hematologic/Lymphatic: Does not bruise/bleed easily.  Skin: Positive for skin cancer (S/p surgeries on forehad). Negative for itching and rash.  Musculoskeletal: Negative for myalgias.  Gastrointestinal: Negative for abdominal pain, nausea and vomiting.  Genitourinary: Negative for dysuria.  Neurological: Negative for dizziness and weakness.  Psychiatric/Behavioral: Positive for depression. The patient is not nervous/anxious.   All other systems reviewed and are negative.      Objective:   There were no vitals filed for this visit.   Physical Exam  Constitutional: He is oriented to person, place, and time. He appears well-developed and well-nourished. No distress.  HENT:  Head: Normocephalic and atraumatic.  Eyes: Pupils are equal, round, and reactive to light. Conjunctivae are normal.  Neck: No JVD present.  Cardiovascular: Normal rate, regular rhythm and intact distal pulses.  Murmur (II/VI RUSB. Stable) heard. Pulses:      Carotid pulses are on the right side with bruit and on the left side with bruit.      Dorsalis pedis pulses are 1+ on the right side and 1+ on the left side.       Posterior tibial pulses are 0 on the right side and 0 on the  left side.  Pulmonary/Chest: Effort normal and breath sounds normal. He has no wheezes. He has no rales.  Abdominal: Soft. Bowel sounds are normal. There is no rebound.  Musculoskeletal:        General: Edema (1+ b/l, nonpitting, stable) present.  Lymphadenopathy:    He has no cervical adenopathy.  Neurological: He is alert and oriented to person, place, and time. No cranial nerve deficit.  Skin: Skin is warm and dry.  Psychiatric: He has a normal mood and affect.  Nursing note and vitals reviewed.  Assessment & Recommendations:    78 y/o Caucasian male with severe aortic stenosis s/p TAVR 26 mm Edwards Sapien 3 on 12/03/2017, CAD s/p CABG (LIMA-LAD, seq SVG-PDA, PLA, seq SVG-OM1, OM2, SVG-Diag1), mild to mod mitral and tricuspid regurgitation, mod PH, PAD s/p PTA Rt prox SFA 12/2017, paroxysmal Afib, hypertension, type 2 DM, advanced CKD, moderate grp II pulmonary hypertension.  1. Coronary artery disease of bypass graft of native heart with stable angina pectoris: Known complex coronary artery bypass graft disease.  No angina symptoms since last visit in 11/2018. Given his advanced CKD and his reluctance to consider dialysis, recommend medical management.  Continue amlodipine 10 mg daily, and metoprolol succinate 50 mg daily. Intolerant to Imdur due to nausea.  2. HFpEF: At baseline, NYHA class II  3. S/P TAVR (transcatheter aortic valve replacement) Well-functioning.  4. Wheezing: I suspect this is due to reactive airway disease. He is unwilling to see pulmonology or perform PFT at this time. I prescribed him albuterol spray for as needed use.   5. PAF (paroxysmal atrial fibrillation) (H[facility]) Continue Eliquis 5 mg twice daily.  Anticipate reduction in dose in future based on age, to 2.5 mg twice daily. I suspect Afib is the reason for his worsened mitral and tricuspid regurgitation and WHO Grp II pulmonary hypertension. His options for antiarrhythmic therapy to control sinus  rhythm are limited, owing to his renal dysfunction and structural heart disease. Continue rate control therapy. In future, could consider cardioversion if persistent Afib ensues.   6. Bilateral carotid stenosis: Moderate, stable. Continue medical management.   7. CKD (chronic kidney disease) stage 4, GFR 15-29 ml/min (HCC) Patient does not want to consider dialysis and is reluctant to see a nephrologist.   8. Depression, unspecified depression type I suspect underlying depression emanating from complex medical illnesses is causing significant burden.I have encouraged him to discuss this with his PCP Dr. Volanda Napoleon  I will see him back in 3 months. I encourage discussion regarding long term health goals and advanced care directive, in light of his multiple medical commodities.   Nigel Mormon, MD [facility] Pager: [phone] Office: [phone] If no answer Cell [phone]   ADDENDUM: patient has NYHA class II symptoms of shortness of breath at fatigue, mostly related to his CAD, obesity and reactive airway disease. Angelena Form PA-C  [facility]

## 2019-01-15 ENCOUNTER — Ambulatory Visit: Payer: Medicare Other

## 2019-01-15 ENCOUNTER — Encounter: Payer: Self-pay | Admitting: Cardiology

## 2019-01-15 ENCOUNTER — Other Ambulatory Visit: Payer: Self-pay

## 2019-01-15 ENCOUNTER — Ambulatory Visit: Payer: Medicare Other | Admitting: Cardiology

## 2019-01-15 VITALS — BP 135/70 | HR 89 | Ht 69.0 in | Wt 236.4 lb

## 2019-01-15 DIAGNOSIS — I25708 Atherosclerosis of coronary artery bypass graft(s), unspecified, with other forms of angina pectoris: Secondary | ICD-10-CM

## 2019-01-15 DIAGNOSIS — I5032 Chronic diastolic (congestive) heart failure: Secondary | ICD-10-CM | POA: Diagnosis not present

## 2019-01-15 DIAGNOSIS — R062 Wheezing: Secondary | ICD-10-CM

## 2019-01-15 DIAGNOSIS — I480 Paroxysmal atrial fibrillation: Secondary | ICD-10-CM

## 2019-01-15 DIAGNOSIS — R0602 Shortness of breath: Secondary | ICD-10-CM

## 2019-01-15 DIAGNOSIS — Z952 Presence of prosthetic heart valve: Secondary | ICD-10-CM

## 2019-01-15 DIAGNOSIS — I2510 Atherosclerotic heart disease of native coronary artery without angina pectoris: Secondary | ICD-10-CM | POA: Diagnosis not present

## 2019-01-15 DIAGNOSIS — I352 Nonrheumatic aortic (valve) stenosis with insufficiency: Secondary | ICD-10-CM

## 2019-01-15 DIAGNOSIS — F329 Major depressive disorder, single episode, unspecified: Secondary | ICD-10-CM

## 2019-01-15 DIAGNOSIS — I10 Essential (primary) hypertension: Secondary | ICD-10-CM

## 2019-01-15 DIAGNOSIS — I272 Pulmonary hypertension, unspecified: Secondary | ICD-10-CM

## 2019-01-15 DIAGNOSIS — F32A Depression, unspecified: Secondary | ICD-10-CM

## 2019-01-15 DIAGNOSIS — I6523 Occlusion and stenosis of bilateral carotid arteries: Secondary | ICD-10-CM

## 2019-01-15 MED ORDER — ALBUTEROL SULFATE HFA 108 (90 BASE) MCG/ACT IN AERS: 1.0000 | INHALATION_SPRAY | Freq: Four times a day (QID) | RESPIRATORY_TRACT | 2 refills | Status: DC | PRN

## 2019-01-23 ENCOUNTER — Ambulatory Visit: Payer: Medicare Other | Admitting: Endocrinology

## 2019-02-25 ENCOUNTER — Encounter: Payer: Self-pay | Admitting: Cardiology

## 2019-02-25 ENCOUNTER — Other Ambulatory Visit: Payer: Self-pay

## 2019-02-25 ENCOUNTER — Ambulatory Visit (INDEPENDENT_AMBULATORY_CARE_PROVIDER_SITE_OTHER): Payer: Medicare Other | Admitting: Cardiology

## 2019-02-25 VITALS — BP 153/60 | HR 91 | Ht 69.0 in | Wt 236.0 lb

## 2019-02-25 DIAGNOSIS — R350 Frequency of micturition: Secondary | ICD-10-CM | POA: Insufficient documentation

## 2019-02-25 DIAGNOSIS — I480 Paroxysmal atrial fibrillation: Secondary | ICD-10-CM | POA: Diagnosis not present

## 2019-02-25 DIAGNOSIS — Z952 Presence of prosthetic heart valve: Secondary | ICD-10-CM

## 2019-02-25 DIAGNOSIS — I272 Pulmonary hypertension, unspecified: Secondary | ICD-10-CM

## 2019-02-25 NOTE — Progress Notes (Signed)
Patient is here for follow up visit.  Subjective:   @Patient  ID: Cameron [name], male    DOB: 02/05/1941, 78 y.o.   MRN: [id number]  I connected withthe patient on 04/29/20by a video enabled telemedicine application and verified that I am speaking with the correct person using two identifiers.    I discussed the limitations of evaluation and management by telemedicine and the availability of in person appointments. The patient expressed understanding and agreed to proceed.   This visit type was conducted due to national recommendations for restrictions regarding the COVID-19 Pandemic (e.g. social distancing). This format is felt to be most appropriate for this patient at this time. All issues noted in this document were discussed and addressed. No physical exam was performed (except for noted visual exam findings with Tele health visits). The patient has consented to conduct a Tele health visit and understands insurance will be billed.     Chief complaint:  Shortness of breath Increased urinary frequency "[place]"  HPI   78 y/o Caucasian male with severe aortic stenosis s/p TAVR 26 mm Edwards Sapien 3 on 12/03/2017, CAD s/p CABG (LIMA-LAD, seq SVG-PDA, PLA, seq SVG-OM1, OM2, SVG-Diag1), mild to mod mitral and tricuspid regurgitation, mod PH, PAD s/p PTA Rt prox SFA 12/2017, paroxysmal Afib, hypertension, type 2 DM, advanced CKD, moderate grp II pulmonary hypertension.  Patient called today with acute complaints of increased frequency of urination for last few days.  Denies any fever, chills, burning urination.  In addition, he also has"sores" in his pubic area.  His shortness of breath continues to be present, but is stable with no recent change.  He denies any chest pain.   Past Medical History:  Diagnosis Date  . CAD (coronary artery disease)    a. 2002: s/p CABG x 4V (LIMA-LAD, seq SVG-PDA, PLA, seq SVG-OM1, OM2, SVG-Diag1)  . CKD (chronic kidney disease)   . COPD  (chronic obstructive pulmonary disease) ([place])   . Diabetes mellitus without complication ([place])   . HLD (hyperlipidemia)   . Hypertension   . Obesity   . PAF (paroxysmal atrial fibrillation) ([place])   . RBBB   . 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 Severe aortic stenosis     Past Surgical History:  Procedure Laterality Date  . ABDOMINAL AORTAGRAM Right 01/28/2018   LOWER EXTREMITY  . ABDOMINAL AORTOGRAM W/LOWER EXTREMITY N/A 01/28/2018   Procedure: ABDOMINAL AORTOGRAM W/LOWER EXTREMITY Runoff;  Surgeon: Nigel Mormon, MD;  Location: [place] [facility];  Service: Cardiovascular;  Laterality: N/A;  . CARDIAC SURGERY    . CORONARY BALLOON ANGIOPLASTY N/A 09/23/2017   Procedure: CORONARY BALLOON ANGIOPLASTY;  Surgeon: Nigel Mormon, MD;  Location: [place] [facility];  Service: Cardiovascular;  Laterality: N/A;  . FINGER AMPUTATION    . HERNIA REPAIR    . open heart surgery    . PERIPHERAL VASCULAR BALLOON ANGIOPLASTY  01/28/2018   Procedure: PERIPHERAL VASCULAR BALLOON ANGIOPLASTY;  Surgeon: Nigel Mormon, MD;  Location: [place] [facility];  Service: Cardiovascular;;  Rt SFA  . RIGHT HEART CATH AND CORONARY/GRAFT ANGIOGRAPHY N/A 09/17/2017   Procedure: RIGHT HEART CATH AND CORONARY/GRAFT ANGIOGRAPHY;  Surgeon: Nigel Mormon, MD;  Location: [place] [facility];  Service: Cardiovascular;  Laterality: N/A;  . TEE WITHOUT CARDIOVERSION N/A 12/03/2017   Procedure: TRANSESOPHAGEAL ECHOCARDIOGRAM (TEE);  Surgeon: Sherren Mocha, MD;  Location: [place];  Service: Open Heart Surgery;  Laterality: N/A;  . TOE AMPUTATION    . TRANSCATHETER AORTIC VALVE REPLACEMENT, TRANSFEMORAL N/A 12/03/2017   Procedure: TRANSCATHETER AORTIC VALVE REPLACEMENT, TRANSFEMORAL;  Surgeon: Sherren Mocha, MD;  Location: [place];  Service: Open Heart Surgery;  Laterality: N/A;  . ULTRASOUND GUIDANCE FOR  VASCULAR ACCESS  09/17/2017   Procedure: Ultrasound Guidance For Vascular Access;  Surgeon: Nigel Mormon, MD;  Location: [place] [facility];  Service: Cardiovascular;;    Social History   Socioeconomic History  . Marital status: Married    Spouse name: Not on file  . Number of children: 2  . Years of education: Not on file  . Highest education level: Not on file  Occupational History  . Occupation: Doctor, general practice  Social Needs  . Financial resource strain: Not on file  . Food insecurity:    Worry: Not on file    Inability: Not on file  . Transportation needs:    Medical: Not on file    Non-medical: Not on file  Tobacco Use  . Smoking status: Former Smoker    Packs/day: 2.00    Years: 20.00    Pack years: 40.00    Types: Cigarettes    Last attempt to quit: 09/28/1978    Years since quitting: 40.4  . Smokeless tobacco: Never Used  Substance and Sexual Activity  . Alcohol use: No  . Drug use: No  . Sexual activity: Not on file  Lifestyle  . Physical activity:    Days per week: Not on file    Minutes per session: Not on file  . Stress: Not on file  Relationships  . Social connections:    Talks on phone: Not on file    Gets together: Not on file    Attends religious service: Not on file    Active member of club or organization: Not on file    Attends meetings of clubs or organizations: Not on file    Relationship status: Not on file  . Intimate partner violence:    Fear of current or ex partner: Not on file    Emotionally abused: Not on file    Physically abused: Not on file    Forced sexual activity: Not on file  Other Topics Concern  . Not on file  Social History Narrative  . Not on file    Current Outpatient Medications on File Prior to Visit  Medication Sig Dispense Refill  . albuterol (PROVENTIL HFA;VENTOLIN HFA) 108 (90 Base) MCG/ACT inhaler Inhale 1-2 puffs into the lungs every 6 (six) hours as needed for wheezing or shortness of  breath. 1 Inhaler 2  . amLODipine (NORVASC) 10 MG tablet Take 10 mg by mouth daily.    Marland Kitchen apixaban (ELIQUIS) 5 MG TABS tablet Take 1 tablet (5 mg total) by mouth 2 (two) times daily. 180 tablet 2  . aspirin EC 81 MG EC tablet Take 1 tablet (81 mg total) by mouth daily.    . furosemide (LASIX) 40 MG tablet Take 1 tablet (40 mg total) by mouth 2 (two) times daily. (Patient taking differently: Take 40 mg by mouth daily. ) 60 tablet 11  . glucose blood (ONETOUCH VERIO) test strip 1 each by Other route 2 (two) times daily. And lancets 2/day 100 each 12  . insulin NPH Human (NOVOLIN N RELION) 100 UNIT/ML injection Inject 2 mLs (200 Units total) into the skin every morning. And syringes 2/day 70 mL 11  . isosorbide mononitrate (IMDUR)  30 MG 24 hr tablet Take 1 tablet (30 mg total) by mouth daily. (Patient not taking: Reported on 01/15/2019) 30 tablet 11  . metoprolol succinate (TOPROL-XL) 50 MG 24 hr tablet Take 50 mg by mouth daily. Take with or immediately following a meal.     . nitroGLYCERIN (NITROSTAT) 0.4 MG SL tablet Place 0.4 mg every 5 (five) minutes as needed under the tongue for chest pain.    . rosuvastatin (CRESTOR) 40 MG tablet Take 40 mg daily after supper by mouth.      No current facility-administered medications on file prior to visit.     Cardiovascular studies:  Echocardiogram 01/15/2019: Left ventricle cavity is normal in size. Moderate concentric hypertrophy of the left ventricle. Normal global wall motion. Diastolic function could not be evaluated due to atrial fibrillation. Calculated EF 63%. Well functioning bioprosthetic TAVR valve with no significant regurgitation. Mean PG 12 mmHg which is likely normal unchanged compared to 12/2017.  Mild calcification of the mitral valve annulus and leaflets. Mild restriction without significant stenosis. Moderate (Grade II) mitral regurgitation. Moderate tricuspid regurgitation. Estimated pulmonary artery systolic pressure 52 mmHg. Compared  to previous study on 12/27/2017, mitral and tricuspid regurgitation is increased in severity. Pulmonary hypertension is new.   Lexiscan myoview stress test 01/24/2018:  1. Pharmacologic stress testing was performed with intravenous administration of .4 mg of Lexiscan over a 10-15 seconds infusion. Peak BP 162/78 mmHg. Stress symptoms included nausea. Exercise capacity not assessed.  Stress EKG is non diagnostic for ischemia as it is a pharmacologic stress.  2. The overall quality of the study is good. There is no evidence of abnormal lung activity. Stress and rest SPECT images demonstrate homogeneous tracer distribution throughout the myocardium, with physiological apical thinning. Gated SPECT imaging reveals normal myocardial thickening and wall motion. The left ventricular ejection fraction was normal (53%).   3. Low risk study.  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  Left ICA 40-59% stenosis. Both the lower end of scale.  Both vertebral arteries patent with antegrade flow.  ABI 02/05/2018: This exam reveals normal perfusion of the right lower extremity with mildly abnormal (biphasic) waveform (ABI 0.97) and mildly decreased perfusion of the left lower extremity, with mildly abnormal (biphasic) waveform noted at the post tibial artery level (ABI).  Compared to 01/15/2018, Rt ABI 0.66 Lt ABI 0.77, study suggests successful revascularization.  Review of Systems  Constitution: Positive for malaise/fatigue. Negative for decreased appetite, weight gain and weight loss.  HENT: Negative for congestion.   Eyes: Negative for visual disturbance.  Cardiovascular: Positive for dyspnea on exertion (Stable). Negative for chest pain, leg swelling (Improved), palpitations and syncope.  Respiratory: Positive for shortness of breath.   Endocrine: Negative for cold intolerance.  Hematologic/Lymphatic: Does not bruise/bleed easily.  Skin: Positive for skin cancer (S/p surgeries on  forehad). Negative for itching and rash.  Musculoskeletal: Negative for myalgias.  Gastrointestinal: Negative for abdominal pain, nausea and vomiting.  Genitourinary: Negative for dysuria.       "sores" on pubic area  Neurological: Negative for dizziness and weakness.  Psychiatric/Behavioral: Positive for depression. The patient is not nervous/anxious.   All other systems reviewed and are negative.      Objective:    Vitals:   02/25/19 1349  BP: (!) 153/60  Pulse: 91     Physical Exam  Constitutional: He is oriented to person, place, and time. He appears well-developed and well-nourished. No distress.  Cardiovascular:  Murmur: II/VI RUSB. Stable. Pulses:  Carotid pulses are on the right side with bruit and on the left side with bruit.      Dorsalis pedis pulses are 1+ on the right side and 1+ on the left side.       Posterior tibial pulses are 0 on the right side and 0 on the left side.  Pulmonary/Chest: Effort normal.  Genitourinary:    Genitourinary Comments: Ulcers on pubic area   Musculoskeletal:        General: No edema (1+ b/l, nonpitting, stable).  Neurological: He is alert and oriented to person, place, and time.  Psychiatric: He has a normal mood and affect.  Nursing note and vitals reviewed.      Assessment & Recommendations:    78 y/o Caucasian male with severe aortic stenosis s/p TAVR 26 mm Edwards Sapien 3 on 12/03/2017, CAD s/p CABG (LIMA-LAD, seq SVG-PDA, PLA, seq SVG-OM1, OM2, SVG-Diag1), mild to mod mitral and tricuspid regurgitation, mod PH, PAD s/p PTA Rt prox SFA 12/2017, paroxysmal Afib, hypertension, type 2 DM, advanced CKD, moderate grp II pulmonary hypertension.  Increased urinary frequency, ulcers in pubic area: All symptoms are not obvious for UTI, I am concerned about ulcers on pubic area.  Given his uncontrolled diabetes, I believe he needs attention and consideration for antibiotic treatment.  I have contacted his PCP Dr. Volanda Napoleon office who  have kindly offered the patient a virtual visit tomorrow on 02/26/2019.  Shortness of breath: Multifactorial with paroxysmal atrial fibrillation, diastolic heart failure, as well as secondary pulmonary hypertension.  I offered him an EKG in office and consideration for cardioversion in the near future.  Eventually, he is reluctant to do this and current virus pandemic situation.  I do not believe there is any acute change in his shortness of breath and this can be addressed during follow-up visits.   Coronary artery disease of bypass graft of native heart with stable angina pectoris: Known complex coronary artery bypass graft disease.  No angina symptoms since last visit in 11/2018. Given his advanced CKD and his reluctance to consider dialysis, recommend medical management.  Continue amlodipine 10 mg daily, and metoprolol succinate 50 mg daily. Intolerant to Imdur due to nausea.  S/P TAVR (transcatheter aortic valve replacement) Well-functioning.  PAF (paroxysmal atrial fibrillation) (HCC) Continue Eliquis 5 mg twice daily.  Anticipate reduction in dose in future based on age, to 2.5 mg twice daily. I suspect Afib is the reason for his worsened mitral and tricuspid regurgitation and WHO Grp II pulmonary hypertension. His options for antiarrhythmic therapy to control sinus rhythm are limited, owing to his renal dysfunction and structural heart disease. Continue rate control therapy. In future, could consider cardioversion if persistent Afib ensues.   Bilateral carotid stenosis: Moderate, stable. Continue medical management.   CKD (chronic kidney disease) stage 4, GFR 15-29 ml/min (HCC) Patient does not want to consider dialysis and is reluctant to see a nephrologist.   Depression, unspecified depression type I suspect underlying depression emanating from complex medical illnesses is causing significant burden.I have encouraged him to discuss this with his PCP Dr. Johnella Moloney, MD  [facility] [place] Pager: [phone] Office: [phone] If no answer Cell [phone]   ADDENDUM: patient has NYHA class II symptoms of shortness of breath at fatigue, mostly related to his CAD, obesity and reactive airway disease. Angelena Form PA-C  [facility]

## 2019-02-26 ENCOUNTER — Encounter: Payer: Self-pay | Admitting: Family Medicine

## 2019-02-26 ENCOUNTER — Other Ambulatory Visit: Payer: Self-pay

## 2019-02-26 ENCOUNTER — Ambulatory Visit (INDEPENDENT_AMBULATORY_CARE_PROVIDER_SITE_OTHER): Payer: Medicare Other | Admitting: Family Medicine

## 2019-02-26 VITALS — BP 138/66 | HR 94 | Temp 98.2°F | Ht 69.0 in | Wt 234.4 lb

## 2019-02-26 DIAGNOSIS — R3915 Urgency of urination: Secondary | ICD-10-CM

## 2019-02-26 DIAGNOSIS — I5032 Chronic diastolic (congestive) heart failure: Secondary | ICD-10-CM | POA: Diagnosis not present

## 2019-02-26 DIAGNOSIS — R635 Abnormal weight gain: Secondary | ICD-10-CM | POA: Diagnosis not present

## 2019-02-26 DIAGNOSIS — R0600 Dyspnea, unspecified: Secondary | ICD-10-CM | POA: Diagnosis not present

## 2019-02-26 DIAGNOSIS — L0390 Cellulitis, unspecified: Secondary | ICD-10-CM

## 2019-02-26 DIAGNOSIS — N184 Chronic kidney disease, stage 4 (severe): Secondary | ICD-10-CM

## 2019-02-26 LAB — CBC WITH DIFFERENTIAL/PLATELET
Basophils Absolute: 0.1 10*3/uL (ref 0.0–0.1)
Basophils Relative: 0.8 % (ref 0.0–3.0)
Eosinophils Absolute: 0.3 10*3/uL (ref 0.0–0.7)
Eosinophils Relative: 3.5 % (ref 0.0–5.0)
HCT: 40.3 % (ref 39.0–52.0)
Hemoglobin: 12.8 g/dL — ABNORMAL LOW (ref 13.0–17.0)
Lymphocytes Relative: 15.9 % (ref 12.0–46.0)
Lymphs Abs: 1.5 10*3/uL (ref 0.7–4.0)
MCHC: 31.7 g/dL (ref 30.0–36.0)
MCV: 81.7 fl (ref 78.0–100.0)
Monocytes Absolute: 1 10*3/uL (ref 0.1–1.0)
Monocytes Relative: 10.6 % (ref 3.0–12.0)
Neutro Abs: 6.7 10*3/uL (ref 1.4–7.7)
Neutrophils Relative %: 69.2 % (ref 43.0–77.0)
Platelets: 166 10*3/uL (ref 150.0–400.0)
RBC: 4.94 Mil/uL (ref 4.22–5.81)
RDW: 17.5 % — ABNORMAL HIGH (ref 11.5–15.5)
WBC: 9.7 10*3/uL (ref 4.0–10.5)

## 2019-02-26 LAB — COMPREHENSIVE METABOLIC PANEL
ALT: 14 U/L (ref 0–53)
AST: 12 U/L (ref 0–37)
Albumin: 3.6 g/dL (ref 3.5–5.2)
Alkaline Phosphatase: 100 U/L (ref 39–117)
BUN: 42 mg/dL — ABNORMAL HIGH (ref 6–23)
CO2: 34 mEq/L — ABNORMAL HIGH (ref 19–32)
Calcium: 8.7 mg/dL (ref 8.4–10.5)
Chloride: 101 mEq/L (ref 96–112)
Creatinine, Ser: 3.05 mg/dL — ABNORMAL HIGH (ref 0.40–1.50)
GFR: 19.92 mL/min — ABNORMAL LOW (ref >60.00)
Glucose, Bld: 260 mg/dL — ABNORMAL HIGH (ref 70–99)
Potassium: 4.9 mEq/L (ref 3.5–5.1)
Sodium: 142 mEq/L (ref 135–145)
Total Bilirubin: 0.4 mg/dL (ref 0.2–1.2)
Total Protein: 6.6 g/dL (ref 6.0–8.3)

## 2019-02-26 LAB — POCT URINALYSIS DIPSTICK
Bilirubin, UA: NEGATIVE
Glucose, UA: NEGATIVE
Ketones, UA: NEGATIVE
Leukocytes, UA: NEGATIVE
Nitrite, UA: NEGATIVE
Odor: NEGATIVE
Protein, UA: POSITIVE — AB
Spec Grav, UA: 1.015 (ref 1.010–1.025)
Urobilinogen, UA: 0.2 E.U./dL
pH, UA: 6.5 (ref 5.0–8.0)

## 2019-02-26 LAB — BRAIN NATRIURETIC PEPTIDE: Pro B Natriuretic peptide (BNP): 399 pg/mL — ABNORMAL HIGH (ref 0.0–100.0)

## 2019-02-26 MED ORDER — DOXYCYCLINE HYCLATE 100 MG PO CAPS: 100.0000 mg | ORAL_CAPSULE | Freq: Two times a day (BID) | ORAL | 0 refills | Status: DC

## 2019-02-26 MED ORDER — MIRABEGRON ER 25 MG PO TB24: 25.0000 mg | ORAL_TABLET | Freq: Every day | ORAL | 5 refills | Status: AC

## 2019-02-26 NOTE — Progress Notes (Signed)
Subjective:     Patient ID: Cameron [name], male   DOB: 01-27-1941, 78 y.o.   MRN: [id number]  HPI Patient was seen today as a work in with multiple complaints.  Has multiple chronic problems including history of chronic diastolic heart failure, history of aortic stenosis, previous aortic valve replacement surgery, COPD, chronic kidney disease, type 2 diabetes.  He is seen today for the following issues  Urine urgency.  He initially states that this was for a few days but then his wife states this has been going on for least a month.  He takes furosemide 40 mg daily.  He is getting up 4-5 times at night to urinate.  No burning with urination.  No slow stream.  He gets urge to urinate and then has very little time to get to the bathroom.  He is limited somewhat with ambulation as he ambulates very slowly.  Denies any fevers or chills.  History of chronic diastolic heart failure.  On furosemide 40 mg daily.  Has had some recent increased edema.  He states he has had chronic dyspnea and chronic orthopnea for over a year.  Not much improved following aortic valve replacement surgery.  Does not weigh daily.  It appears from looking back his baseline weight is around 224 pounds and 234 pounds today.  No recent chest pains.  He is followed by cardiology.. No recent cough or fever  He has had some recent sores in his suprapubic region.  He recently noticed some surrounding erythema.  No fevers or chills.  Past history of MRSA.  Past Medical History:  Diagnosis Date  . CAD (coronary artery disease)    a. 2002: s/p CABG x 4V (LIMA-LAD, seq SVG-PDA, PLA, seq SVG-OM1, OM2, SVG-Diag1)  . CKD (chronic kidney disease)   . COPD (chronic obstructive pulmonary disease) ([place])   . Diabetes mellitus without complication ([place])   . HLD (hyperlipidemia)   . Hypertension   . Obesity   . PAF (paroxysmal atrial fibrillation) ([place])   . RBBB   . 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   26 mm Edwards  Sapien 3 THV placed via percutaneous right transfemoral approach  . Severe aortic stenosis    Past Surgical History:  Procedure Laterality Date  . ABDOMINAL AORTAGRAM Right 01/28/2018   LOWER EXTREMITY  . ABDOMINAL AORTOGRAM W/LOWER EXTREMITY N/A 01/28/2018   Procedure: ABDOMINAL AORTOGRAM W/LOWER EXTREMITY Runoff;  Surgeon: Nigel Mormon, MD;  Location: [place] [facility];  Service: Cardiovascular;  Laterality: N/A;  . CARDIAC SURGERY    . CORONARY BALLOON ANGIOPLASTY N/A 09/23/2017   Procedure: CORONARY BALLOON ANGIOPLASTY;  Surgeon: Nigel Mormon, MD;  Location: [place] [facility];  Service: Cardiovascular;  Laterality: N/A;  . FINGER AMPUTATION    . HERNIA REPAIR    . open heart surgery    . PERIPHERAL VASCULAR BALLOON ANGIOPLASTY  01/28/2018   Procedure: PERIPHERAL VASCULAR BALLOON ANGIOPLASTY;  Surgeon: Nigel Mormon, MD;  Location: [place] [facility];  Service: Cardiovascular;;  Rt SFA  . RIGHT HEART CATH AND CORONARY/GRAFT ANGIOGRAPHY N/A 09/17/2017   Procedure: RIGHT HEART CATH AND CORONARY/GRAFT ANGIOGRAPHY;  Surgeon: Nigel Mormon, MD;  Location: [place] [facility];  Service: Cardiovascular;  Laterality: N/A;  . TEE WITHOUT CARDIOVERSION N/A 12/03/2017   Procedure: TRANSESOPHAGEAL ECHOCARDIOGRAM (TEE);  Surgeon: Sherren Mocha, MD;  Location: [place];  Service: Open Heart Surgery;  Laterality: N/A;  . TOE AMPUTATION    . TRANSCATHETER AORTIC VALVE  REPLACEMENT, TRANSFEMORAL N/A 12/03/2017   Procedure: TRANSCATHETER AORTIC VALVE REPLACEMENT, TRANSFEMORAL;  Surgeon: Sherren Mocha, MD;  Location: [place];  Service: Open Heart Surgery;  Laterality: N/A;  . ULTRASOUND GUIDANCE FOR VASCULAR ACCESS  09/17/2017   Procedure: Ultrasound Guidance For Vascular Access;  Surgeon: Nigel Mormon, MD;  Location: [place] [facility];  Service: Cardiovascular;;    reports that he quit smoking about 40 years ago. His smoking use included cigarettes. He has a 40.00 pack-year  smoking history. He has never used smokeless tobacco. He reports that he does not drink alcohol or use drugs. family history includes Diabetes in his mother; Heart disease in his father; Lung cancer in his maternal grandmother; Stomach cancer in his paternal grandfather. Allergies  Allergen Reactions  . Cefuroxime Diarrhea  . Ezetimibe Diarrhea     Review of Systems  Constitutional: Positive for unexpected weight change. Negative for chills and fever.  Respiratory: Positive for shortness of breath. Negative for cough and wheezing.   Cardiovascular: Positive for leg swelling. Negative for chest pain.  Gastrointestinal: Negative for abdominal pain.  Genitourinary: Positive for dysuria and urgency. Negative for decreased urine volume and difficulty urinating.       Objective:   Physical Exam Constitutional:      Appearance: He is well-developed.  Pulmonary:     Effort: Pulmonary effort is normal.     Comments: rales in both bases Musculoskeletal:     Right lower leg: Edema present.     Left lower leg: Edema present.     Comments: Patient has 2+ pitting edema lower legs feet and ankles bilaterally  Skin:    Comments: Has a couple of superficial open sore suprapubic region.  He has fairly large area approximately 12 x 12 cm of surrounding erythema.  Slight warmth to touch.  Slightly tender to touch.  Neurological:     Mental Status: He is alert.        Assessment:     #1 recent weight gain.  History of diastolic heart failure.  His dyspnea is chronic and essentially unchanged.  Suspect he is significantly volume overloaded.  #2 urinary urgency.  He is not describing any symptoms to suggest likely infection and no obstructive symptoms  #3 cellulitis changes suprapubic area with a couple open sores which may be related to his incontinence/skin maceration and breakdown.  Past history of MRSA  #4 chronic kidney disease  #5 history of aortic valve replacement.  On chronic  anticoagulation with Eliquis  #6 history of type 2 diabetes    Plan:     -Recommend frequent elevation of legs -Daily weights -Increase furosemide to 40 mg twice daily for the next 5 days -Touch base with 5-day follow-up with video conference with primary to reassess -Doxycycline 100 mg twice daily (has history of intolerance with cephalosporin and past history of MRSA).  May need to add some strep coverage if not improving next few days. -Check further labs with BNP, comprehensive metabolic panel, CBC, urinalysis -Discussed medication options for urine urgency.  Consider trial of Myrbetriq.  Urine urgency is currently very disruptive as he is getting up about 5 times at night to urinate.  This is challenging to manage given the fact that he is currently volume overloaded and needs increased diuresis  Eulas Post MD  [facility] at [facility]

## 2019-02-26 NOTE — Patient Instructions (Signed)
Elevate legs frequently  Increase Lasix to twice daily for the next days

## 2019-02-27 ENCOUNTER — Telehealth: Payer: Self-pay

## 2019-02-27 NOTE — Telephone Encounter (Signed)
Overdue for an appt. Pt declined to schedule an appt. Further added, "I will wait about a month and then give you a call." States, "I have too much going on".

## 2019-03-03 ENCOUNTER — Encounter: Payer: Self-pay | Admitting: Family Medicine

## 2019-03-03 ENCOUNTER — Other Ambulatory Visit: Payer: Self-pay

## 2019-03-03 ENCOUNTER — Ambulatory Visit (INDEPENDENT_AMBULATORY_CARE_PROVIDER_SITE_OTHER): Payer: Medicare Other | Admitting: Family Medicine

## 2019-03-03 DIAGNOSIS — L0390 Cellulitis, unspecified: Secondary | ICD-10-CM | POA: Diagnosis not present

## 2019-03-03 DIAGNOSIS — I5032 Chronic diastolic (congestive) heart failure: Secondary | ICD-10-CM | POA: Diagnosis not present

## 2019-03-03 DIAGNOSIS — R0602 Shortness of breath: Secondary | ICD-10-CM

## 2019-03-03 DIAGNOSIS — R3915 Urgency of urination: Secondary | ICD-10-CM | POA: Diagnosis not present

## 2019-03-03 DIAGNOSIS — N184 Chronic kidney disease, stage 4 (severe): Secondary | ICD-10-CM

## 2019-03-03 MED ORDER — FUROSEMIDE 40 MG PO TABS: 40.0000 mg | ORAL_TABLET | Freq: Every day | ORAL | 4 refills | Status: DC

## 2019-03-03 NOTE — Progress Notes (Signed)
Virtual Visit via Telephone Note Had difficulty connecting with pt via webex and doximity. I connected with Cameron [name] on 03/03/19 at  3:00 PM EDT by telephone and verified that I am speaking with the correct person using two identifiers.   I discussed the limitations, risks, security and privacy concerns of performing an evaluation and management service by telephone and the availability of in person appointments. I also discussed with the patient that there may be a patient responsible charge related to this service. The patient expressed understanding and agreed to proceed.  Location patient: home Location provider: work or home office Participants present for the call: patient, provider Patient did not have a visit in the prior 7 days to address this/these issue(s).   History of Present Illness: Pt seen 4/30 in office for CHF exacerbation, cellulitis of suprapubic area, and urinary urgency.  Pt started on Doxycycline, myrbetriq, and advised to increase lasix 40 mg to BID x 5 days.  Labs obtained including CBC, CMP, UA, BNP.  Since OFV, pt states the medication he was given has helped some.  Still having SOB with and without exertion. Noted decreased energy x wks.  Per pt "may have some swelling" in LEs.  Coughing up white mucus.  Worse at night.  Cough drops help.  Endorses rhinorrhea.  Denies fever, ST, HAs.  Pt states not drinking water. Drinking mostly pepsi and a glass of orange juice.  Pt getting up 6-8 x at night to urinate.  States happening x months.  Has not noticed a difference since starting mybetriq.  Observations/Objective: Patient sounds cheerful and well on the phone. I do not appreciate any SOB. Speech and thought processing are grossly intact. Patient reported vitals:  Assessment and Plan: Chronic diastolic heart failure ([place]) -likely contributing to SOB and LE edema -discussed continuing Lasix 40 mg BID x 4 days -will need repeat BMP given CKD 4 -pt advised to  contact Cardiology -daily weights and elevating LEs encouraged  SOB (shortness of breath) -stable during visit. -likely multifactorial in nature given pt's h/o CHF, COPD, pulmonary HTN (noted on ECHO 01/15/19), severe Aortic stenosis, Afib. -encouraged to consider obtaining a pulse oximeter for home use. -daily weights encouraged. -pt has declined pulmonology referral in the past.  Will continue to encourage consideration.  Cellulitis, unspecified cellulitis site -improving per pt.  Provider unable to visualize given connectivity issues with video programs -continue Doxycycline 100 mg BID -if area stops improving consider addition of Amoxicillin  Urinary urgency -chronic issue -continue trial of Myrbetriq  -will readdress at next visit. -UA from 4/30 reviewed.  Positive protein, 1+ RBCs  CKD (chronic kidney disease) stage 4, GFR 15-29 ml/min (HCC) -creatinine 3.05 and GFR 19.92 on 4/30 -strongly encouraged to f/u with Nephrology, though pt reluctant.  Saw Dr. Joelyn Oms in the past. -will place new Nephrology referral if needed.  Follow Up Instructions: Pt to call the office in 3 days, sooner if needed, to reassess  I did not refer this patient for an OV in the next 24 hours for this/these issue(s).  I discussed the assessment and treatment plan with the patient. The patient was provided an opportunity to ask questions and all were answered. The patient agreed with the plan and demonstrated an understanding of the instructions.   The patient was advised to call back or seek an in-person evaluation if the symptoms worsen or if the condition fails to improve as anticipated.  I provided 12 minutes of non-face-to-face time during this encounter.  Billie Ruddy, MD

## 2019-03-06 ENCOUNTER — Telehealth (INDEPENDENT_AMBULATORY_CARE_PROVIDER_SITE_OTHER): Payer: Medicare Other | Admitting: Family Medicine

## 2019-03-06 DIAGNOSIS — I5032 Chronic diastolic (congestive) heart failure: Secondary | ICD-10-CM | POA: Diagnosis not present

## 2019-03-06 DIAGNOSIS — J449 Chronic obstructive pulmonary disease, unspecified: Secondary | ICD-10-CM | POA: Diagnosis not present

## 2019-03-06 DIAGNOSIS — R0609 Other forms of dyspnea: Secondary | ICD-10-CM | POA: Diagnosis not present

## 2019-03-06 NOTE — Telephone Encounter (Signed)
Patient states that Dr Volanda Napoleon wanted him to call the office so she could talk to him at 3:00 today.  He is requesting a call back so he can talk to her but he don't know what she wanted to talk to him about.

## 2019-03-09 NOTE — Telephone Encounter (Signed)
Pt was to call back in 3 days to give an update on if symptoms improved or not.

## 2019-03-09 NOTE — Telephone Encounter (Signed)
Please Advise

## 2019-03-10 ENCOUNTER — Encounter: Payer: Self-pay | Admitting: Family Medicine

## 2019-03-10 ENCOUNTER — Ambulatory Visit: Payer: Self-pay

## 2019-03-10 ENCOUNTER — Other Ambulatory Visit: Payer: Self-pay

## 2019-03-10 ENCOUNTER — Ambulatory Visit (INDEPENDENT_AMBULATORY_CARE_PROVIDER_SITE_OTHER): Payer: Medicare Other | Admitting: Family Medicine

## 2019-03-10 ENCOUNTER — Ambulatory Visit (INDEPENDENT_AMBULATORY_CARE_PROVIDER_SITE_OTHER): Payer: Medicare Other

## 2019-03-10 VITALS — BP 130/62 | HR 92 | Temp 97.9°F | Ht 69.0 in | Wt 239.1 lb

## 2019-03-10 DIAGNOSIS — I5032 Chronic diastolic (congestive) heart failure: Secondary | ICD-10-CM | POA: Diagnosis not present

## 2019-03-10 DIAGNOSIS — R0600 Dyspnea, unspecified: Secondary | ICD-10-CM

## 2019-03-10 DIAGNOSIS — I2510 Atherosclerotic heart disease of native coronary artery without angina pectoris: Secondary | ICD-10-CM | POA: Diagnosis not present

## 2019-03-10 DIAGNOSIS — N184 Chronic kidney disease, stage 4 (severe): Secondary | ICD-10-CM

## 2019-03-10 DIAGNOSIS — R635 Abnormal weight gain: Secondary | ICD-10-CM

## 2019-03-10 MED ORDER — TORSEMIDE 20 MG PO TABS: ORAL_TABLET | ORAL | 1 refills | Status: DC

## 2019-03-10 NOTE — Telephone Encounter (Signed)
Please see messages.

## 2019-03-10 NOTE — Telephone Encounter (Signed)
FYI. Thanks.

## 2019-03-10 NOTE — Patient Instructions (Signed)
Keep sodium intake down  Weight daily  STOP the Furosemide and start Torsemide two tablets once daily.

## 2019-03-10 NOTE — Progress Notes (Signed)
Subjective:     Patient ID: Cameron [name], male   DOB: 1940/12/13, 78 y.o.   MRN: [id number]  HPI  Patient called earlier requesting to be re-seen.  He has had some progressive shortness of breath for past couple weeks.  Refer to note from 02/26/2019 for details  Patient was seen today as a work in with multiple complaints.  Has multiple chronic problems including history of chronic diastolic heart failure, history of aortic stenosis, previous aortic valve replacement surgery, COPD, chronic kidney disease, type 2 diabetes.  He is seen today for the following issues  Urine urgency.  He initially states that this was for a few days but then his wife states this has been going on for least a month.  He takes furosemide 40 mg daily.  He is getting up 4-5 times at night to urinate.  No burning with urination.  No slow stream.  He gets urge to urinate and then has very little time to get to the bathroom.  He is limited somewhat with ambulation as he ambulates very slowly.  Denies any fevers or chills.  History of chronic diastolic heart failure.  On furosemide 40 mg daily.  Has had some recent increased edema.  He states he has had chronic dyspnea and chronic orthopnea for over a year.  Not much improved following aortic valve replacement surgery.  Does not weigh daily.  It appears from looking back his baseline weight is around 224 pounds and 234 pounds today.  No recent chest pains.  He is followed by cardiology.. No recent cough or fever  He has had some recent sores in his suprapubic region.  He recently noticed some surrounding erythema.  No fevers or chills.  Past history of MRSA.  Patient has been taking Lasix 40 mg twice daily without much improvement.  In fact his weight is up another 5 pounds today compared to 4/30.  This is up from his usual baseline weight around 224 pounds.  He has some dyspnea at rest and lying supine.  Increased leg edema.  He has chronic kidney disease with creatinine  around 3.  Recent BNP level 399.  Patient had echocardiogram back in March which showed ejection fraction 63%.  He had pulmonary hypertension.  Status post TAVR.  Denies any chest pains.  Does have occasional cough.  No fever.  Wt Readings from Last 3 Encounters:  03/10/19 239 lb 1.6 oz (108.5 kg)  02/26/19 234 lb 6.4 oz (106.3 kg)  02/25/19 236 lb (107 kg)     Past Medical History:  Diagnosis Date  . CAD (coronary artery disease)    a. 2002: s/p CABG x 4V (LIMA-LAD, seq SVG-PDA, PLA, seq SVG-OM1, OM2, SVG-Diag1)  . CKD (chronic kidney disease)   . COPD (chronic obstructive pulmonary disease) ([place])   . Diabetes mellitus without complication ([place])   . HLD (hyperlipidemia)   . Hypertension   . Obesity   . PAF (paroxysmal atrial fibrillation) ([place])   . RBBB   . 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 Severe aortic stenosis    Past Surgical History:  Procedure Laterality Date  . ABDOMINAL AORTAGRAM Right 01/28/2018   LOWER EXTREMITY  . ABDOMINAL AORTOGRAM W/LOWER EXTREMITY N/A 01/28/2018   Procedure: ABDOMINAL AORTOGRAM W/LOWER EXTREMITY [place];  Surgeon: Nigel Mormon, MD;  Location: [facility];  Service: Cardiovascular;  Laterality: N/A;  . CARDIAC  SURGERY    . CORONARY BALLOON ANGIOPLASTY N/A 09/23/2017   Procedure: CORONARY BALLOON ANGIOPLASTY;  Surgeon: Nigel Mormon, MD;  Location: [place];  Service: Cardiovascular;  Laterality: N/A;  . FINGER AMPUTATION    . HERNIA REPAIR    . open heart surgery    . PERIPHERAL VASCULAR BALLOON ANGIOPLASTY  01/28/2018   Procedure: PERIPHERAL VASCULAR BALLOON ANGIOPLASTY;  Surgeon: Nigel Mormon, MD;  Location: [place] [facility];  Service: Cardiovascular;;  Rt SFA  . RIGHT HEART CATH AND CORONARY/GRAFT ANGIOGRAPHY N/A 09/17/2017   Procedure: RIGHT HEART CATH AND CORONARY/GRAFT ANGIOGRAPHY;  Surgeon:  Nigel Mormon, MD;  Location: [facility];  Service: Cardiovascular;  Laterality: N/A;  . TEE WITHOUT CARDIOVERSION N/A 12/03/2017   Procedure: TRANSESOPHAGEAL ECHOCARDIOGRAM (TEE);  Surgeon: Sherren Mocha, MD;  Location: [facility];  Service: Open Heart Surgery;  Laterality: N/A;  . TOE AMPUTATION    . TRANSCATHETER AORTIC VALVE REPLACEMENT, TRANSFEMORAL N/A 12/03/2017   Procedure: TRANSCATHETER AORTIC VALVE REPLACEMENT, TRANSFEMORAL;  Surgeon: Sherren Mocha, MD;  Location: [facility];  Service: Open Heart Surgery;  Laterality: N/A;  . ULTRASOUND GUIDANCE FOR VASCULAR ACCESS  09/17/2017   Procedure: Ultrasound Guidance For Vascular Access;  Surgeon: Nigel Mormon, MD;  Location: [facility];  Service: Cardiovascular;;    reports that he quit smoking about 40 years ago. His smoking use included cigarettes. He has a 40.00 pack-year smoking history. He has never used smokeless tobacco. He reports that he does not drink alcohol or use drugs. family history includes Diabetes in his mother; Heart disease in his father; Lung cancer in his maternal grandmother; Stomach cancer in his paternal grandfather. Allergies  Allergen Reactions  . Cefuroxime Diarrhea  . Ezetimibe Diarrhea     Review of Systems  Constitutional: Positive for unexpected weight change. Negative for appetite change, chills and fever.  Respiratory: Positive for shortness of breath and wheezing.   Cardiovascular: Positive for leg swelling. Negative for chest pain and palpitations.  Gastrointestinal: Negative for abdominal pain.  Genitourinary: Negative for dysuria.       Objective:   Physical Exam Constitutional:      Appearance: Normal appearance.  Neck:     Musculoskeletal: Neck supple.  Cardiovascular:     Rate and Rhythm: Normal rate and regular rhythm.  Pulmonary:     Comments: He has a few faint crackles in both bases.  Pulse oximetry 92%. Musculoskeletal:     Right lower leg: Edema present.      Left lower leg: Edema present.  Neurological:     Mental Status: He is alert and oriented to person, place, and time.     Cranial Nerves: No cranial nerve deficit.        Assessment:     Increased weight gain and dyspnea.  Suspect acute on chronic diastolic heart failure.  He has chronic kidney disease which is complicating things.  May not be absorbing Lasix very well    Plan:     -Discontinue furosemide and start torsemide 40 mg once daily -Watch sodium intake closely -Recheck BMP and BNP levels today -Continue daily weights -Follow-up with primary in 2 days to reassess -If not responding over the next 48 hours may need hospital admission.  They know to call 911 or go to ER for any acute worsening of dyspnea  Eulas Post MD [facility]

## 2019-03-10 NOTE — Telephone Encounter (Signed)
Pt seen in office this afternoon

## 2019-03-10 NOTE — Telephone Encounter (Signed)
Rec'd. Call from pt's daughter, Levander Campion; rec'd permission to speak with her via pt's wife.  Dianne reported the pt. has had ongoing shortness of breath over past 2 weeks.  Today stated she can hear audible wheezes.  Stated that the pt. Can talk on the phone to answer assess. Questions.  The pt. stated he is having trouble getting his breath, and "it is worse by the day."   Pt. stated he is moderately short of breath. Reported he is not able to lay flat, that he has to sit up or sleep in a recliner.  Reported swelling in feet and legs with right > left; feels this has increased over the past week.  Reported intermittent chest tightness of left chest with both rest and activity.  Requesting to see Dr. Elease Hashimoto.  Stated he called on 5/8 with "  update on symptoms, and never rec'd a call back.  Stated he was not in favor of doing a virtual visit; stated "I am sick and need some help."  Pt. able to speak in short, complete sentences; unable to hear wheezing or increased shortness of breath, in speaking with the patient. Called FC; transferred pt. To the Flow Coordinator for an appt. To be scheduled in the office. Pt. And daughter agreed with plan.         Reason for Disposition . [1] Longstanding difficulty breathing (e.g., CHF, COPD, emphysema) AND [2] WORSE than normal    Pt. c/o moderate Shortness of breath that has gradually worsened over past week.  C/o wheezing.  Reported he called to report update to Dr. Volanda Napoleon on Friday, 5/8, but did not receive a return call.  Answer Assessment - Initial Assessment Questions 1. RESPIRATORY STATUS: "Describe your breathing?" (e.g., wheezing, shortness of breath, unable to speak, severe coughing)      Pt. stating he is having trouble getting his breath; getting worse by the day ; audible wheezing per daughter  2. ONSET: "When did this breathing problem begin?"      About 2 weeks ago 3. PATTERN "Does the difficult breathing come and go, or has it been constant since it  started?"      Constant but it increased with activity 4. SEVERITY: "How bad is your breathing?" (e.g., mild, moderate, severe)    - MILD: No SOB at rest, mild SOB with walking, speaks normally in sentences, can lay down, no retractions, pulse < 100.    - MODERATE: SOB at rest, SOB with minimal exertion and prefers to sit, cannot lie down flat, speaks in phrases, mild retractions, audible wheezing, pulse 100-120.    - SEVERE: Very SOB at rest, speaks in single words, struggling to breathe, sitting hunched forward, retractions, pulse > 120      Moderate  5. RECURRENT SYMPTOM: "Have you had difficulty breathing before?" If so, ask: "When was the last time?" and "What happened that time?"      yes 6. CARDIAC HISTORY: "Do you have any history of heart disease?" (e.g., heart attack, angina, bypass surgery, angioplasty)      CAD; Aortic Valve Replacement; CHF 7. LUNG HISTORY: "Do you have any history of lung disease?"  (e.g., pulmonary embolus, asthma, emphysema)     Denied the above 8. CAUSE: "What do you think is causing the breathing problem?"      unknown 9. OTHER SYMPTOMS: "Do you have any other symptoms? (e.g., dizziness, runny nose, cough, chest pain, fever)     Shortness of breath, chest tightness on left side  of chest; feels with both rest and activity; swelling in feet and legs right >left ; moderate with increase over past week.  10. PREGNANCY: "Is there any chance you are pregnant?" "When was your last menstrual period?"       N/a  11. TRAVEL: "Have you traveled out of the country in the last month?" (e.g., travel history, exposures)     N/A  Protocols used: BREATHING DIFFICULTY-A-AH

## 2019-03-11 ENCOUNTER — Encounter (HOSPITAL_COMMUNITY): Payer: Self-pay

## 2019-03-11 ENCOUNTER — Emergency Department (HOSPITAL_COMMUNITY): Payer: Medicare Other

## 2019-03-11 ENCOUNTER — Inpatient Hospital Stay (HOSPITAL_COMMUNITY)
Admission: EM | Admit: 2019-03-11 | Discharge: 2019-03-17 | DRG: 291 | Disposition: A | Discharge status: Home Health | Payer: Medicare Other | Attending: Student | Admitting: Student

## 2019-03-11 DIAGNOSIS — Z87891 Personal history of nicotine dependence: Secondary | ICD-10-CM

## 2019-03-11 DIAGNOSIS — I130 Hypertensive heart and chronic kidney disease with heart failure and stage 1 through stage 4 chronic kidney disease, or unspecified chronic kidney disease: Secondary | ICD-10-CM | POA: Diagnosis not present

## 2019-03-11 DIAGNOSIS — L8990 Pressure ulcer of unspecified site, unspecified stage: Secondary | ICD-10-CM

## 2019-03-11 DIAGNOSIS — J449 Chronic obstructive pulmonary disease, unspecified: Secondary | ICD-10-CM

## 2019-03-11 DIAGNOSIS — E1129 Type 2 diabetes mellitus with other diabetic kidney complication: Secondary | ICD-10-CM | POA: Diagnosis present

## 2019-03-11 DIAGNOSIS — I10 Essential (primary) hypertension: Secondary | ICD-10-CM | POA: Diagnosis present

## 2019-03-11 DIAGNOSIS — F329 Major depressive disorder, single episode, unspecified: Secondary | ICD-10-CM | POA: Diagnosis present

## 2019-03-11 DIAGNOSIS — Z794 Long term (current) use of insulin: Secondary | ICD-10-CM

## 2019-03-11 DIAGNOSIS — I2510 Atherosclerotic heart disease of native coronary artery without angina pectoris: Secondary | ICD-10-CM | POA: Diagnosis present

## 2019-03-11 DIAGNOSIS — J9621 Acute and chronic respiratory failure with hypoxia: Secondary | ICD-10-CM | POA: Diagnosis present

## 2019-03-11 DIAGNOSIS — Z8249 Family history of ischemic heart disease and other diseases of the circulatory system: Secondary | ICD-10-CM

## 2019-03-11 DIAGNOSIS — I480 Paroxysmal atrial fibrillation: Secondary | ICD-10-CM | POA: Diagnosis not present

## 2019-03-11 DIAGNOSIS — Z953 Presence of xenogenic heart valve: Secondary | ICD-10-CM

## 2019-03-11 DIAGNOSIS — L89322 Pressure ulcer of left buttock, stage 2: Secondary | ICD-10-CM | POA: Diagnosis present

## 2019-03-11 DIAGNOSIS — Z6835 Body mass index (BMI) 35.0-35.9, adult: Secondary | ICD-10-CM

## 2019-03-11 DIAGNOSIS — R079 Chest pain, unspecified: Secondary | ICD-10-CM

## 2019-03-11 DIAGNOSIS — Z66 Do not resuscitate: Secondary | ICD-10-CM | POA: Diagnosis present

## 2019-03-11 DIAGNOSIS — E1122 Type 2 diabetes mellitus with diabetic chronic kidney disease: Secondary | ICD-10-CM | POA: Diagnosis present

## 2019-03-11 DIAGNOSIS — I509 Heart failure, unspecified: Secondary | ICD-10-CM

## 2019-03-11 DIAGNOSIS — Z79899 Other long term (current) drug therapy: Secondary | ICD-10-CM

## 2019-03-11 DIAGNOSIS — Z801 Family history of malignant neoplasm of trachea, bronchus and lung: Secondary | ICD-10-CM

## 2019-03-11 DIAGNOSIS — Z951 Presence of aortocoronary bypass graft: Secondary | ICD-10-CM

## 2019-03-11 DIAGNOSIS — I890 Lymphedema, not elsewhere classified: Secondary | ICD-10-CM | POA: Diagnosis present

## 2019-03-11 DIAGNOSIS — Z20828 Contact with and (suspected) exposure to other viral communicable diseases: Secondary | ICD-10-CM | POA: Diagnosis present

## 2019-03-11 DIAGNOSIS — I2729 Other secondary pulmonary hypertension: Secondary | ICD-10-CM | POA: Diagnosis present

## 2019-03-11 DIAGNOSIS — Z7982 Long term (current) use of aspirin: Secondary | ICD-10-CM

## 2019-03-11 DIAGNOSIS — N184 Chronic kidney disease, stage 4 (severe): Secondary | ICD-10-CM | POA: Diagnosis present

## 2019-03-11 DIAGNOSIS — I2781 Cor pulmonale (chronic): Secondary | ICD-10-CM | POA: Diagnosis present

## 2019-03-11 DIAGNOSIS — I4821 Permanent atrial fibrillation: Secondary | ICD-10-CM | POA: Diagnosis present

## 2019-03-11 DIAGNOSIS — I5033 Acute on chronic diastolic (congestive) heart failure: Secondary | ICD-10-CM | POA: Diagnosis present

## 2019-03-11 DIAGNOSIS — Z888 Allergy status to other drugs, medicaments and biological substances status: Secondary | ICD-10-CM

## 2019-03-11 DIAGNOSIS — I081 Rheumatic disorders of both mitral and tricuspid valves: Secondary | ICD-10-CM | POA: Diagnosis present

## 2019-03-11 DIAGNOSIS — E11649 Type 2 diabetes mellitus with hypoglycemia without coma: Secondary | ICD-10-CM | POA: Diagnosis not present

## 2019-03-11 DIAGNOSIS — L89311 Pressure ulcer of right buttock, stage 1: Secondary | ICD-10-CM | POA: Diagnosis present

## 2019-03-11 DIAGNOSIS — Z833 Family history of diabetes mellitus: Secondary | ICD-10-CM

## 2019-03-11 DIAGNOSIS — E785 Hyperlipidemia, unspecified: Secondary | ICD-10-CM | POA: Diagnosis present

## 2019-03-11 DIAGNOSIS — E1165 Type 2 diabetes mellitus with hyperglycemia: Secondary | ICD-10-CM | POA: Diagnosis present

## 2019-03-11 DIAGNOSIS — Z7901 Long term (current) use of anticoagulants: Secondary | ICD-10-CM

## 2019-03-11 DIAGNOSIS — E669 Obesity, unspecified: Secondary | ICD-10-CM | POA: Diagnosis present

## 2019-03-11 DIAGNOSIS — I4510 Unspecified right bundle-branch block: Secondary | ICD-10-CM | POA: Diagnosis present

## 2019-03-11 DIAGNOSIS — I872 Venous insufficiency (chronic) (peripheral): Secondary | ICD-10-CM | POA: Diagnosis present

## 2019-03-11 DIAGNOSIS — Z800 Family history of malignant neoplasm of digestive organs: Secondary | ICD-10-CM

## 2019-03-11 LAB — BASIC METABOLIC PANEL
Anion gap: 10 (ref 5–15)
BUN: 52 mg/dL — ABNORMAL HIGH (ref 6–23)
BUN: 52 mg/dL — ABNORMAL HIGH (ref 8–23)
CO2: 34 mEq/L — ABNORMAL HIGH (ref 19–32)
CO2: 30 mmol/L (ref 22–32)
Calcium: 8.4 mg/dL (ref 8.4–10.5)
Calcium: 8.3 mg/dL — ABNORMAL LOW (ref 8.9–10.3)
Chloride: 103 mEq/L (ref 96–112)
Chloride: 103 mmol/L (ref 98–111)
Creatinine, Ser: 3 mg/dL — ABNORMAL HIGH (ref 0.40–1.50)
Creatinine, Ser: 3.42 mg/dL — ABNORMAL HIGH (ref 0.61–1.24)
GFR calc Af Amer: 19 mL/min — ABNORMAL LOW (ref >60)
GFR calc non Af Amer: 16 mL/min — ABNORMAL LOW (ref >60)
GFR: 20.3 mL/min — ABNORMAL LOW (ref >60.00)
Glucose, Bld: 171 mg/dL — ABNORMAL HIGH (ref 70–99)
Glucose, Bld: 128 mg/dL — ABNORMAL HIGH (ref 70–99)
Potassium: 4.9 mEq/L (ref 3.5–5.1)
Potassium: 4.5 mmol/L (ref 3.5–5.1)
Sodium: 143 mEq/L (ref 135–145)
Sodium: 143 mmol/L (ref 135–145)

## 2019-03-11 LAB — SARS CORONAVIRUS 2 BY RT PCR (HOSPITAL ORDER, PERFORMED IN ~~LOC~~ HOSPITAL LAB): SARS Coronavirus 2: NEGATIVE

## 2019-03-11 LAB — BRAIN NATRIURETIC PEPTIDE
B Natriuretic Peptide: 513.3 pg/mL — ABNORMAL HIGH (ref 0.0–100.0)
Pro B Natriuretic peptide (BNP): 420 pg/mL — ABNORMAL HIGH (ref 0.0–100.0)

## 2019-03-11 LAB — CBC
HCT: 36.9 % — ABNORMAL LOW (ref 39.0–52.0)
Hemoglobin: 10.9 g/dL — ABNORMAL LOW (ref 13.0–17.0)
MCH: 25.5 pg — ABNORMAL LOW (ref 26.0–34.0)
MCHC: 29.5 g/dL — ABNORMAL LOW (ref 30.0–36.0)
MCV: 86.4 fL (ref 80.0–100.0)
Platelets: 160 10*3/uL (ref 150–400)
RBC: 4.27 MIL/uL (ref 4.22–5.81)
RDW: 17.4 % — ABNORMAL HIGH (ref 11.5–15.5)
WBC: 9.8 10*3/uL (ref 4.0–10.5)
nRBC: 0 % (ref 0.0–0.2)

## 2019-03-11 LAB — TROPONIN I: Troponin I: <0.03 ng/mL (ref <0.03)

## 2019-03-11 MED ORDER — HYDRALAZINE HCL 20 MG/ML IJ SOLN: 5.0000 mg | INTRAMUSCULAR | Status: DC | PRN

## 2019-03-11 MED ORDER — INSULIN ASPART 100 UNIT/ML ~~LOC~~ SOLN
0.0000 [IU] | Freq: Three times a day (TID) | SUBCUTANEOUS | Status: DC
  Administered 2019-03-12: 3 [IU] via SUBCUTANEOUS
  Administered 2019-03-13: 7 [IU] via SUBCUTANEOUS
  Administered 2019-03-13: 2 [IU] via SUBCUTANEOUS
  Administered 2019-03-13: 7 [IU] via SUBCUTANEOUS
  Administered 2019-03-14: 5 [IU] via SUBCUTANEOUS

## 2019-03-11 MED ORDER — FUROSEMIDE 10 MG/ML IJ SOLN
60.0000 mg | Freq: Once | INTRAMUSCULAR | Status: AC
  Administered 2019-03-12: 60 mg via INTRAVENOUS
  Filled 2019-03-11: qty 6

## 2019-03-11 MED ORDER — IPRATROPIUM BROMIDE 0.02 % IN SOLN
0.5000 mg | Freq: Three times a day (TID) | RESPIRATORY_TRACT | Status: DC
  Administered 2019-03-12: 0.5 mg via RESPIRATORY_TRACT
  Filled 2019-03-11: qty 2.5

## 2019-03-11 MED ORDER — DM-GUAIFENESIN ER 30-600 MG PO TB12: 1.0000 | ORAL_TABLET | Freq: Two times a day (BID) | ORAL | Status: DC | PRN

## 2019-03-11 MED ORDER — AMLODIPINE BESYLATE 10 MG PO TABS
10.0000 mg | ORAL_TABLET | Freq: Every day | ORAL | Status: DC
  Administered 2019-03-12 – 2019-03-15 (×4): 10 mg via ORAL
  Filled 2019-03-11 (×5): qty 1

## 2019-03-11 MED ORDER — SODIUM CHLORIDE 0.9% FLUSH: 3.0000 mL | INTRAVENOUS | Status: DC | PRN

## 2019-03-11 MED ORDER — INSULIN NPH (HUMAN) (ISOPHANE) 100 UNIT/ML ~~LOC~~ SUSP
150.0000 [IU] | Freq: Every day | SUBCUTANEOUS | Status: DC
  Filled 2019-03-11: qty 10

## 2019-03-11 MED ORDER — APIXABAN 5 MG PO TABS
5.0000 mg | ORAL_TABLET | Freq: Two times a day (BID) | ORAL | Status: DC
  Administered 2019-03-12 – 2019-03-17 (×12): 5 mg via ORAL
  Filled 2019-03-11 (×12): qty 1

## 2019-03-11 MED ORDER — ALBUTEROL SULFATE (2.5 MG/3ML) 0.083% IN NEBU: 3.0000 mL | INHALATION_SOLUTION | RESPIRATORY_TRACT | Status: DC | PRN

## 2019-03-11 MED ORDER — MIRABEGRON ER 25 MG PO TB24
25.0000 mg | ORAL_TABLET | Freq: Every day | ORAL | Status: DC
  Administered 2019-03-12 – 2019-03-17 (×6): 25 mg via ORAL
  Filled 2019-03-11 (×7): qty 1

## 2019-03-11 MED ORDER — SODIUM CHLORIDE 0.9 % IV SOLN: 250.0000 mL | INTRAVENOUS | Status: DC | PRN

## 2019-03-11 MED ORDER — ONDANSETRON HCL 4 MG/2ML IJ SOLN: 4.0000 mg | Freq: Four times a day (QID) | INTRAMUSCULAR | Status: DC | PRN

## 2019-03-11 MED ORDER — FUROSEMIDE 10 MG/ML IJ SOLN: 40.0000 mg | Freq: Two times a day (BID) | INTRAMUSCULAR | Status: DC

## 2019-03-11 MED ORDER — ACETAMINOPHEN 325 MG PO TABS: 650.0000 mg | ORAL_TABLET | ORAL | Status: DC | PRN

## 2019-03-11 MED ORDER — IPRATROPIUM BROMIDE 0.02 % IN SOLN: 0.5000 mg | RESPIRATORY_TRACT | Status: DC

## 2019-03-11 MED ORDER — SODIUM CHLORIDE 0.9% FLUSH
3.0000 mL | Freq: Two times a day (BID) | INTRAVENOUS | Status: DC
  Administered 2019-03-12 – 2019-03-17 (×12): 3 mL via INTRAVENOUS

## 2019-03-11 MED ORDER — NITROGLYCERIN 0.4 MG SL SUBL: 0.4000 mg | SUBLINGUAL_TABLET | SUBLINGUAL | Status: DC | PRN

## 2019-03-11 MED ORDER — ROSUVASTATIN CALCIUM 20 MG PO TABS
40.0000 mg | ORAL_TABLET | Freq: Every day | ORAL | Status: DC
  Administered 2019-03-12 – 2019-03-16 (×6): 40 mg via ORAL
  Filled 2019-03-11 (×6): qty 2

## 2019-03-11 MED ORDER — ASPIRIN EC 81 MG PO TBEC
81.0000 mg | DELAYED_RELEASE_TABLET | Freq: Every day | ORAL | Status: DC
  Administered 2019-03-12 – 2019-03-17 (×6): 81 mg via ORAL
  Filled 2019-03-11 (×6): qty 1

## 2019-03-11 MED ORDER — METOPROLOL SUCCINATE ER 100 MG PO TB24
100.0000 mg | ORAL_TABLET | Freq: Every day | ORAL | Status: DC
  Administered 2019-03-12 – 2019-03-17 (×6): 100 mg via ORAL
  Filled 2019-03-11 (×6): qty 1

## 2019-03-11 NOTE — H&P (Addendum)
History and Physical    Cameron [name] IRC:[id number] DOB: [birth year] DOA: 03/11/2019  Referring MD/NP/PA:   PCP: Billie Ruddy, MD   Patient coming from:  The patient is coming from home.  At baseline, pt is independent for most of ADL.        Chief Complaint: SOB  HPI: Cameron [name] is a 78 y.o. male with medical history significant of hypertension, hyperlipidemia, diabetes mellitus, COPD, CAD, CABG, CKD 4, right bundle blockade, s/p TAVR, obesity, PAF on Eliquis, dCHF, who presents with shortness of breath.  Patient states that he has been having shortness of breath for more than 2 weeks, which has been progressively getting worse.  He has mild cough with little yellow-colored mucus production.  Denies chest pain, fever or chills.  Patient states that he has bilateral leg edema.  He has gained 4 to 5 pounds recently.  Patient does not have nausea, vomiting, diarrhea, abdominal pain, symptoms of UTI or unilateral weakness.  Patient was seen by his cardiologist twice recently, and changed his diuretics from Lasix to torsemide without significant help.    ED Course: pt was found to have BNP of 513.3, slightly worsening renal function, negative COVID-19 test, temperature normal, no tachycardia, oxygen desaturated to upper 80s% on room air. CXR negative today. Pt is placed on telemetry bed for observation.   Review of Systems:   General: no fevers, chills, has body weight gain, has fatigue HEENT: no blurry vision, hearing changes or sore throat Respiratory: has dyspnea, coughing, no wheezing CV: no chest pain, no palpitations GI: no nausea, vomiting, abdominal pain, diarrhea, constipation GU: no dysuria, burning on urination, increased urinary frequency, hematuria  Ext: has leg edema Neuro: no unilateral weakness, numbness, or tingling, no vision change or hearing loss Skin: no rash, no skin tear. MSK: No muscle spasm, no deformity, no limitation of range of movement in spin Heme: No  easy bruising.  Travel history: No recent long distant travel.  Allergy:  Allergies  Allergen Reactions   Cefuroxime Diarrhea   Ezetimibe Diarrhea    Past Medical History:  Diagnosis Date   CAD (coronary artery disease)    a. 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   26 mm Edwards Sapien 3 THV placed via percutaneous right transfemoral approach   Severe aortic stenosis     Past Surgical History:  Procedure Laterality Date   ABDOMINAL AORTAGRAM Right 01/28/2018   LOWER EXTREMITY   ABDOMINAL AORTOGRAM W/LOWER EXTREMITY N/A 01/28/2018   Procedure: ABDOMINAL AORTOGRAM W/LOWER EXTREMITY [place];  Surgeon: Nigel Mormon, MD;  Location: [facility];  Service: Cardiovascular;  Laterality: N/A;   CARDIAC SURGERY     CORONARY BALLOON ANGIOPLASTY N/A 09/23/2017   Procedure: CORONARY BALLOON ANGIOPLASTY;  Surgeon: Nigel Mormon, MD;  Location: [facility];  Service: Cardiovascular;  Laterality: N/A;   FINGER AMPUTATION     HERNIA REPAIR     open heart surgery     PERIPHERAL VASCULAR BALLOON ANGIOPLASTY  01/28/2018   Procedure: PERIPHERAL VASCULAR BALLOON ANGIOPLASTY;  Surgeon: Nigel Mormon, MD;  Location: [facility];  Service: Cardiovascular;;  Rt SFA   RIGHT HEART CATH AND CORONARY/GRAFT ANGIOGRAPHY N/A 09/17/2017   Procedure: RIGHT HEART CATH AND CORONARY/GRAFT ANGIOGRAPHY;  Surgeon: Nigel Mormon, MD;  Location: [facility];  Service: Cardiovascular;  Laterality: N/A;   TEE WITHOUT CARDIOVERSION N/A 12/03/2017   Procedure: TRANSESOPHAGEAL ECHOCARDIOGRAM (TEE);  Surgeon: Sherren Mocha, MD;  Location: [facility];  Service: Open Heart Surgery;  Laterality: N/A;   TOE AMPUTATION     TRANSCATHETER AORTIC VALVE REPLACEMENT, TRANSFEMORAL N/A 12/03/2017   Procedure: TRANSCATHETER AORTIC VALVE REPLACEMENT, TRANSFEMORAL;  Surgeon: Sherren Mocha, MD;  Location: [facility];  Service: Open Heart Surgery;  Laterality: N/A;   ULTRASOUND GUIDANCE FOR VASCULAR ACCESS  09/17/2017   Procedure: Ultrasound Guidance For Vascular Access;  Surgeon: Nigel Mormon, MD;  Location: [facility];  Service: Cardiovascular;;    Social History:  reports that he quit smoking about 40 years ago. His smoking use included cigarettes. He has a 40.00 pack-year smoking history. He has never used smokeless tobacco. He reports that he does not drink alcohol or use drugs.  Family History:  Family History  Problem Relation Age of Onset   Diabetes Mother    Heart disease Father    Stomach cancer Paternal Grandfather    Lung cancer Maternal Grandmother        smoked     Prior to Admission medications   Medication Sig Start Date End Date Taking? Authorizing Provider  amLODipine (NORVASC) 10 MG tablet Take 10 mg by mouth daily.    [provider]  apixaban (ELIQUIS) 5 MG TABS tablet Take 1 tablet (5 mg total) by mouth 2 (two) times daily. 12/18/18   Patwardhan, Reynold Bowen, MD  aspirin EC 81 MG EC tablet Take 1 tablet (81 mg total) by mouth daily. 12/06/17   Eileen Stanford, PA-C  insulin NPH Human (NOVOLIN N RELION) 100 UNIT/ML injection Inject 2 mLs (200 Units total) into the skin every morning. And syringes 2/day 11/24/18   Renato Shin, MD  isosorbide mononitrate (IMDUR) 30 MG 24 hr tablet Take 1 tablet (30 mg total) by mouth daily. 12/17/18 12/17/19  Patwardhan, Reynold Bowen, MD  metoprolol succinate (TOPROL-XL) 100 MG 24 hr tablet Take 100 mg by mouth daily. 02/28/19   [provider]  metoprolol succinate (TOPROL-XL) 50 MG 24 hr tablet Take 50 mg by mouth daily. Take with or  immediately following a meal.     [provider]  mirabegron ER (MYRBETRIQ) 25 MG TB24 tablet Take 1 tablet (25 mg total) by mouth daily. 02/26/19   Burchette, Alinda Sierras, MD  nitroGLYCERIN (NITROSTAT) 0.4 MG SL tablet Place 0.4 mg every 5 (five) minutes as needed under the tongue for chest pain.    [provider]  rosuvastatin (CRESTOR) 40 MG tablet Take 40 mg daily after supper by mouth.     [provider]  torsemide (DEMADEX) 20 MG tablet Take two tablets once daily 03/10/19   Eulas Post, MD    Physical Exam: Vitals:   03/11/19 1917 03/11/19 2317  BP: (!) 134/58 (!) 109/56  Pulse: 85 (!) 37  Resp: 18 20  Temp: 98.1 F (36.7 C)   TempSrc: Oral   SpO2: 94% 100%   General: Not in acute distress HEENT:       Eyes: PERRL, EOMI, no scleral icterus.       ENT: No discharge from the ears and nose, no pharynx injection, no tonsillar enlargement.  Neck: Difficult to assess JVD due to obesity, no bruit, no mass felt. Heme: No neck lymph node enlargement. Cardiac: S1/S2, RRR, No murmurs, No gallops or rubs. Respiratory: Has decreased air movement bilaterally. No rales, wheezing, rhonchi or rubs. GI: Soft, nondistended, nontender, no rebound pain, no organomegaly, BS present. GU: No hematuria Ext: 2+ pitting leg edema bilaterally. 2+DP/PT pulse bilaterally. Has chronic venous insufficiency change. Musculoskeletal: No joint deformities, No joint redness or warmth, no limitation of ROM in spin. Skin: No rashes.  Neuro: Alert, oriented X3, cranial nerves II-XII grossly intact, moves all extremities normally.  Psych: Patient is not psychotic, no suicidal or hemocidal ideation.  Labs on Admission: I have personally reviewed following labs and imaging studies  CBC: Recent Labs  Lab 03/11/19 1933  WBC 9.8  HGB 10.9*  HCT 36.9*  MCV 86.4  PLT 500   Basic Metabolic Panel: Recent Labs  Lab 03/10/19 1605 03/11/19 1933  NA 143 143  K 4.9 4.5  CL  103 103  CO2 34* 30  GLUCOSE 171* 128*  BUN 52* 52*  CREATININE 3.00* 3.42*  CALCIUM 8.4 8.3*   GFR: Estimated Creatinine Clearance: 21.6 mL/min (A) (by C-G formula based on SCr of 3.42 mg/dL (H)). Liver Function Tests: No results for input(s): AST, ALT, ALKPHOS, BILITOT, PROT, ALBUMIN in the last 168 hours. No results for input(s): LIPASE, AMYLASE in the last 168 hours. No results for input(s): AMMONIA in the last 168 hours. Coagulation Profile: No results for input(s): INR, PROTIME in the last 168 hours. Cardiac Enzymes: No results for input(s): CKTOTAL, CKMB, CKMBINDEX, TROPONINI in the last 168 hours. BNP (last 3 results) Recent Labs    05/05/18 1535 02/26/19 0958 03/10/19 1605  PROBNP 88.0 399.0* 420.0*   HbA1C: No results for input(s): HGBA1C in the last 72 hours. CBG: No results for input(s): GLUCAP in the last 168 hours. Lipid Profile: No results for input(s): CHOL, HDL, LDLCALC, TRIG, CHOLHDL, LDLDIRECT in the last 72 hours. Thyroid Function Tests: No results for input(s): TSH, T4TOTAL, FREET4, T3FREE, THYROIDAB in the last 72 hours. Anemia Panel: No results for input(s): VITAMINB12, FOLATE, FERRITIN, TIBC, IRON, RETICCTPCT in the last 72 hours. Urine analysis:    Component Value Date/Time   COLORURINE YELLOW 02/16/2018 1450   APPEARANCEUR HAZY (A) 02/16/2018 1450   LABSPEC 1.015 02/16/2018 1450   PHURINE 5.0 02/16/2018 1450   GLUCOSEU >=500 (A) 02/16/2018 1450   HGBUR LARGE (A) 02/16/2018 1450   BILIRUBINUR n 02/26/2019 1035   KETONESUR NEGATIVE 02/16/2018 1450   PROTEINUR Positive (A) 02/26/2019 1035   PROTEINUR 100 (A) 02/16/2018 1450   UROBILINOGEN 0.2 02/26/2019 1035   UROBILINOGEN 1.0 02/22/2013 1318   NITRITE n 02/26/2019 1035   NITRITE NEGATIVE 02/16/2018 1450   LEUKOCYTESUR Negative 02/26/2019 1035   Sepsis Labs: @LABRCNTIP (procalcitonin:4,lacticidven:4) ) Recent Results (from the past 240 hour(s))  SARS Coronavirus 2 (CEPHEID- Performed in  [facility] hospital lab), Hosp Order     Status: None   Collection Time: 03/11/19  8:54 PM  Result Value Ref Range Status   SARS Coronavirus 2 NEGATIVE NEGATIVE Final    Comment: (NOTE) If result is NEGATIVE SARS-CoV-2 target nucleic acids are NOT DETECTED. The SARS-CoV-2 RNA is generally detectable in upper and lower  respiratory specimens during the acute phase of infection. The lowest  concentration of SARS-CoV-2 viral copies this assay can detect is 250  copies / mL. A negative result does not preclude SARS-CoV-2 infection  and should not be used as  the sole basis for treatment or other  patient management decisions.  A negative result may occur with  improper specimen collection / handling, submission of specimen other  than nasopharyngeal swab, presence of viral mutation(s) within the  areas targeted by this assay, and inadequate number of viral copies  (<250 copies / mL). A negative result must be combined with clinical  observations, patient history, and epidemiological information. If result is POSITIVE SARS-CoV-2 target nucleic acids are DETECTED. The SARS-CoV-2 RNA is generally detectable in upper and lower  respiratory specimens dur ing the acute phase of infection.  Positive  results are indicative of active infection with SARS-CoV-2.  Clinical  correlation with patient history and other diagnostic information is  necessary to determine patient infection status.  Positive results do  not rule out bacterial infection or co-infection with other viruses. If result is PRESUMPTIVE POSTIVE SARS-CoV-2 nucleic acids MAY BE PRESENT.   A presumptive positive result was obtained on the submitted specimen  and confirmed on repeat testing.  While 2019 novel coronavirus  (SARS-CoV-2) nucleic acids may be present in the submitted sample  additional confirmatory testing may be necessary for epidemiological  and / or clinical management purposes  to differentiate between  SARS-CoV-2  and other Sarbecovirus currently known to infect humans.  If clinically indicated additional testing with an alternate test  methodology [phone]) is advised. The SARS-CoV-2 RNA is generally  detectable in upper and lower respiratory sp ecimens during the acute  phase of infection. The expected result is Negative. Fact Sheet for Patients:  StrictlyIdeas.no Fact Sheet for Healthcare Providers: BankingDealers.co.za This test is not yet approved or cleared by the [place] FDA and has been authorized for detection and/or diagnosis of SARS-CoV-2 by FDA under an Emergency Use Authorization (EUA).  This EUA will remain in effect (meaning this test can be used) for the duration of the COVID-19 declaration under Section 564(b)(1) of the Act, 21 U.S.C. section 360bbb-3(b)(1), unless the authorization is terminated or revoked sooner. Performed at [facility], [place] [address] [place], [place] [address]      Radiological Exams on Admission: Dg Chest 2 View  Result Date: 03/11/2019 CLINICAL DATA:  78 year old male with shortness of breath and cough EXAM: CHEST - 2 VIEW COMPARISON:  None. FINDINGS: Cardiomediastinal silhouette enlarged, with surgical changes of median sternotomy, CABG, and TAVR. Thickening of the minor fissure. Blunting of the bilateral costophrenic angles with meniscus on the lateral view. Mild interlobular septal thickening. No pneumothorax. No confluent airspace disease. No displaced fractures. Posttraumatic deformity/postsurgical deformity of right ribs. IMPRESSION: Evidence of pulmonary edema and bilateral pleural effusions with associated atelectasis. Surgical changes of median sternotomy, CABG, and TAVR, with cardiomegaly. Electronically Signed   By: Corrie Mckusick D.O.   On: 03/11/2019 09:11   Dg Chest Port 1 View  Result Date: 03/11/2019 CLINICAL DATA:  Shortness of breath EXAM: PORTABLE CHEST 1 VIEW COMPARISON:   09/21/2018 FINDINGS: Mild cardiomegaly. Remote median sternotomy. No pulmonary edema or focal airspace consolidation. Bibasilar atelectasis. No pleural effusion or pneumothorax. IMPRESSION: No active disease. Electronically Signed   By: Ulyses Jarred M.D.   On: 03/11/2019 21:23     EKG: Independently reviewed.  Atrial fibrillation, QTc 482, bifascicular block, nonspecific T wave change.  Assessment/Plan Principal Problem:   Acute on chronic diastolic (congestive) heart failure (HCC) Active Problems:   CKD (chronic kidney disease) stage 4, GFR 15-29 ml/min (HCC)   COPD (chronic obstructive pulmonary disease) (HCC)   CAD (coronary artery  disease)   PAF (paroxysmal atrial fibrillation) (HCC)   Acute on chronic respiratory failure with hypoxia (HCC)   HLD (hyperlipidemia)   Type II diabetes mellitus with renal manifestations (HCC)   HTN (hypertension)  Acute on chronic respiratory failure with hypoxia and acute on chronic diastolic (congestive) heart failure: Likely due to CHF exacerbation.  Patient has elevated BNP, bilateral leg edema, and weight gain recently, clinically consistent with CHF exacerbation.  His COPD may have partially contributed to his shortness of breath.  -will place on tele bed for obs -Lasix 60 mg was given in ED-->bid -2d echo -Daily weights -strict I/O's -Low salt diet -Fluid restriction  COPD (chronic obstructive pulmonary disease): no wheezing or rhonchi on auscultation. -Atrovent inhaler, PRN albuterol inhaler, Mucinex as needed for cough  CKD (chronic kidney disease) stage 4, GFR 15-29 ml/min ([place]): slightly worsening.  Baseline creatinine 3.0 on 03/10/2019.  His creatinine is 3.42, BUN 52. -Monitor renal function by BMP closely  CAD (coronary artery disease): s/p of CABG. No CP: -Continue aspirin, Crestor, PRN nitroglycerin  PAF (paroxysmal atrial fibrillation) ([place]): CHA2DS2-VASc Score is 6, needs oral anticoagulation. Patient is on Eliquis at home.   Heart rate is well controlled. -Continue Eliquis and metoprolol  HLD (hyperlipidemia): -crestor  Type II diabetes mellitus with renal manifestations ([place]): Last A1c 9.3 on 11/24/18, poorly controled. Patient is taking NPH insulin at home -will decrease NPH insulin dose from 200 to 150 units daily -SSI  HTN:  -Continue home medications: Amlodipine, metoprolol -Patient is on IV Lasix -IV hydralazine prn    DVT ppx: on eliquis Code Status: DNR (I discussed with patient, and explained the meaning of [place]. Patient is very sure that he wants to be DNR). Family Communication: None at bed side.      Disposition Plan:  Anticipate discharge back to previous home environment Consults called:  none Admission status: Obs / tele   Date of Service 03/11/2019    [facility]   If 7PM-7AM, please contact night-coverage [email or web address] Password [facility] 03/11/2019, 11:35 PM

## 2019-03-11 NOTE — Telephone Encounter (Signed)
Patient called me this evening after hours, stating that he has been having worsening shortness of breath and dyspnea on exertion.  He was seen by his PCP yesterday and furosemide was discontinued and switched to torsemide.  Patient's daughter and patient state that he is not getting any better, they are wondering whether they should go to the emergency room for continued dyspnea as advised by the PCP.  Advised him to go to the ED.  On questioning, very difficult to state whether this is a acute severe  exacerbation of CHF or mild acute exacerbation of chronic diastolic heart failure, symptoms have been ongoing for several weeks.  Patient does have chronic dyspnea and has multifactorial etiology for the same.  I tried to discuss with him regarding the indications of going to the emergency room, it was extremely difficult conversation to figure out whether he was having acute dyspnea or this is something chronic.  In view of this I advised him to go to the emergency room for safety.  This was a 10-minute telephone encounter and additional 3 minutes spent on review of his records and documentation.  Adrian Prows, MD, [facility] 03/11/2019, 6:47 PM [facility] [place] Pager: [phone] Office: [phone] If no answer Cell [phone]

## 2019-03-11 NOTE — ED Triage Notes (Signed)
Pt states that he has had increased SOB for the past several weeks, along with a cough, productive yellow sputum, no fevers. Increased lasix today without relief. Denies CP

## 2019-03-11 NOTE — ED Provider Notes (Signed)
[facility] EMERGENCY DEPARTMENT Provider Note   CSN: [id number] Arrival date & time: 03/11/19  1909    History   Chief Complaint Chief Complaint  Patient presents with   Shortness of Breath    HPI Cameron [name] is a 78 y.o. male.     Patient is a 78 year old male with a history of coronary artery disease status post CABG, CHF, chronic kidney disease, COPD, diabetes, paroxysmal atrial fibrillation on Eliquis, aortic valve replacement and chronic right bundle branch block presenting today with 2 to 3 weeks of worsening shortness of breath.  Patient states that he has not felt well for months but over the last 2 to 3 weeks he has had worsening exertional dyspnea.  He has followed up with his PCP and had been on Lasix but was changed to torsemide 40 mg yesterday for increased weight gain, worsening leg swelling and worsening shortness of breath.  Now patient states he cannot walk more than 2 steps without gasping for breath causing him to gag and even dry heaves sometimes.  Patient has had a cough with some clear sputum and sometimes yellow.  He denies any fever or sick contacts.  He has been at the doctor's office but is otherwise been mostly at home.  He is taking his prescriptions as prescribed.  He denies any chest pain or abdominal pain.  The history is provided by the patient.  Shortness of Breath  Severity:  Severe Onset quality:  Gradual Duration:  2 weeks Timing:  Constant Progression:  Worsening Chronicity:  Chronic Context comment:  Unknown Relieved by:  Nothing Worsened by:  Activity Ineffective treatments:  Diuretics Associated symptoms: cough, sputum production and vomiting   Associated symptoms: no abdominal pain, no chest pain, no diaphoresis, no fever, no headaches and no neck pain   Risk factors: obesity     Past Medical History:  Diagnosis Date   CAD (coronary artery disease)    a. 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   26 mm Edwards Sapien 3 THV placed via percutaneous right transfemoral approach   Severe aortic stenosis     Patient Active Problem List   Diagnosis Date Noted   Urinary frequency 02/25/2019   Pulmonary hypertension ([place]) 02/25/2019   Depression 12/17/2018   Acute respiratory failure ([place]) 02/16/2018   HCAP (healthcare-associated pneumonia) 02/16/2018   Sepsis ([place]) 02/16/2018   Status post percutaneous transluminal angioplasty (PTA) 01/28/2018   Severe claudication ([place]) 01/27/2018   Abnormal ankle brachial index (ABI) 01/27/2018   Diabetes ([place]) 01/25/2018   Chronic diastolic heart failure ([place]) 12/03/2017   Severe aortic stenosis 12/03/2017   CKD (chronic kidney disease) stage 4, GFR 15-29 ml/min (HCC) 12/03/2017   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   Obesity    COPD (chronic obstructive pulmonary disease) (HCC)    CKD (chronic kidney disease)    CAD (coronary artery disease)    PAF (paroxysmal atrial fibrillation) (HCC)    RBBB     Past Surgical History:  Procedure Laterality Date   ABDOMINAL AORTAGRAM Right 01/28/2018   LOWER EXTREMITY   ABDOMINAL  AORTOGRAM W/LOWER EXTREMITY N/A 01/28/2018   Procedure: ABDOMINAL AORTOGRAM W/LOWER EXTREMITY [place];  Surgeon: Nigel Mormon, MD;  Location: [facility];  Service: Cardiovascular;  Laterality: N/A;   CARDIAC SURGERY     CORONARY BALLOON ANGIOPLASTY N/A 09/23/2017   Procedure: CORONARY BALLOON ANGIOPLASTY;  Surgeon: Nigel Mormon, MD;  Location: [facility];  Service: Cardiovascular;  Laterality: N/A;   FINGER  AMPUTATION     HERNIA REPAIR     open heart surgery     PERIPHERAL VASCULAR BALLOON ANGIOPLASTY  01/28/2018   Procedure: PERIPHERAL VASCULAR BALLOON ANGIOPLASTY;  Surgeon: Nigel Mormon, MD;  Location: [facility];  Service: Cardiovascular;;  Rt SFA   RIGHT HEART CATH AND CORONARY/GRAFT ANGIOGRAPHY N/A 09/17/2017   Procedure: RIGHT HEART CATH AND CORONARY/GRAFT ANGIOGRAPHY;  Surgeon: Nigel Mormon, MD;  Location: [facility];  Service: Cardiovascular;  Laterality: N/A;   TEE WITHOUT CARDIOVERSION N/A 12/03/2017   Procedure: TRANSESOPHAGEAL ECHOCARDIOGRAM (TEE);  Surgeon: Sherren Mocha, MD;  Location: [facility];  Service: Open Heart Surgery;  Laterality: N/A;   TOE AMPUTATION     TRANSCATHETER AORTIC VALVE REPLACEMENT, TRANSFEMORAL N/A 12/03/2017   Procedure: TRANSCATHETER AORTIC VALVE REPLACEMENT, TRANSFEMORAL;  Surgeon: Sherren Mocha, MD;  Location: [facility];  Service: Open Heart Surgery;  Laterality: N/A;   ULTRASOUND GUIDANCE FOR VASCULAR ACCESS  09/17/2017   Procedure: Ultrasound Guidance For Vascular Access;  Surgeon: Nigel Mormon, MD;  Location: [facility];  Service: Cardiovascular;;        Home Medications    Prior to Admission medications   Medication Sig Start Date End Date Taking? Authorizing Provider  amLODipine (NORVASC) 10 MG tablet Take 10 mg by mouth daily.    [provider]  apixaban (ELIQUIS) 5 MG TABS tablet Take 1 tablet (5 mg total) by mouth 2 (two) times daily. 12/18/18   Patwardhan, Reynold Bowen, MD  aspirin EC 81 MG EC tablet Take 1 tablet (81 mg total) by mouth daily. 12/06/17   Eileen Stanford, PA-C  insulin NPH Human (NOVOLIN N RELION) 100 UNIT/ML injection Inject 2 mLs (200 Units total) into the skin every morning. And syringes 2/day 11/24/18   Renato Shin, MD  isosorbide mononitrate (IMDUR) 30 MG 24 hr tablet Take 1 tablet (30 mg total) by mouth daily. 12/17/18 12/17/19  Patwardhan, Reynold Bowen, MD  metoprolol  succinate (TOPROL-XL) 100 MG 24 hr tablet Take 100 mg by mouth daily. 02/28/19   [provider]  metoprolol succinate (TOPROL-XL) 50 MG 24 hr tablet Take 50 mg by mouth daily. Take with or immediately following a meal.     [provider]  mirabegron ER (MYRBETRIQ) 25 MG TB24 tablet Take 1 tablet (25 mg total) by mouth daily. 02/26/19   Burchette, Alinda Sierras, MD  nitroGLYCERIN (NITROSTAT) 0.4 MG SL tablet Place 0.4 mg every 5 (five) minutes as needed under the tongue for chest pain.    [provider]  rosuvastatin (CRESTOR) 40 MG tablet Take 40 mg daily after supper by mouth.     [provider]  torsemide (DEMADEX) 20 MG tablet Take two tablets once daily 03/10/19   Burchette, Alinda Sierras, MD    Family History Family History  Problem Relation Age of Onset   Diabetes Mother    Heart disease Father    Stomach cancer Paternal Grandfather    Lung cancer Maternal Grandmother        smoked    Social  History Social History   Tobacco Use   Smoking status: Former Smoker    Packs/day: 2.00    Years: 20.00    Pack years: 40.00    Types: Cigarettes    Last attempt to quit: 09/28/1978    Years since quitting: 40.4   Smokeless tobacco: Never Used  Substance Use Topics   Alcohol use: No   Drug use: No     Allergies   Cefuroxime and Ezetimibe   Review of Systems Review of Systems  Constitutional: Negative for diaphoresis and fever.  Respiratory: Positive for cough, sputum production and shortness of breath.   Cardiovascular: Negative for chest pain.  Gastrointestinal: Positive for vomiting. Negative for abdominal pain.  Musculoskeletal: Negative for neck pain.  Neurological: Negative for headaches.  All other systems reviewed and are negative.    Physical Exam Updated Vital Signs BP (!) 134/58    Pulse 85    Temp 98.1 F (36.7 C) (Oral)    Resp 18    SpO2 94%   Physical Exam Vitals signs and nursing note reviewed.  Constitutional:       General: He is not in acute distress.    Appearance: He is well-developed. He is obese.  HENT:     Head: Normocephalic and atraumatic.  Eyes:     Conjunctiva/sclera: Conjunctivae normal.     Pupils: Pupils are equal, round, and reactive to light.  Neck:     Musculoskeletal: Normal range of motion and neck supple.  Cardiovascular:     Rate and Rhythm: Normal rate. Rhythm irregularly irregular.     Heart sounds: No murmur.  Pulmonary:     Effort: Pulmonary effort is normal. Tachypnea present. No accessory muscle usage or respiratory distress.     Breath sounds: Wheezing and rales present.     Comments: Occasional scant wheeze in the upper lobes and rales heard in the lower lobes Abdominal:     General: There is no distension.     Palpations: Abdomen is soft.     Tenderness: There is no abdominal tenderness. There is no guarding or rebound.  Musculoskeletal: Normal range of motion.        General: No tenderness.     Right lower leg: Edema present.     Left lower leg: Edema present.     Comments: Patient has bilateral pitting edema up to the knee also with skin changes and pustules present which patient states is chronic  Skin:    General: Skin is warm and dry.     Capillary Refill: Capillary refill takes less than 2 seconds.     Findings: No erythema or rash.  Neurological:     General: No focal deficit present.     Mental Status: He is alert and oriented to person, place, and time.  Psychiatric:        Mood and Affect: Mood normal.        Behavior: Behavior normal.      ED Treatments / Results  Labs (all labs ordered are listed, but only abnormal results are displayed) Labs Reviewed  BASIC METABOLIC PANEL - Abnormal; Notable for the following components:      Result Value   Glucose, Bld 128 (*)    BUN 52 (*)    Creatinine, Ser 3.42 (*)    Calcium 8.3 (*)    GFR calc non Af Amer 16 (*)    GFR calc Af Amer 19 (*)    All other components within normal limits  CBC -  Abnormal; Notable for the following components:   Hemoglobin 10.9 (*)    HCT 36.9 (*)    MCH 25.5 (*)    MCHC 29.5 (*)    RDW 17.4 (*)    All other components within normal limits  BRAIN NATRIURETIC PEPTIDE - Abnormal; Notable for the following components:   B Natriuretic Peptide 513.3 (*)    All other components within normal limits  SARS CORONAVIRUS 2 (HOSPITAL ORDER, [facility])  TROPONIN I    EKG EKG Interpretation  Date/Time:  Wednesday Mar 11 2019 20:34:03 EDT Ventricular Rate:  83 PR Interval:    QRS Duration: 145 QT Interval:  410 QTC Calculation: 482 R Axis:   -91 Text Interpretation:  Atrial fibrillation Right bundle branch block Probable inferior infarct, acute Lateral leads are also involved No significant change since last tracing Confirmed by Blanchie Dessert [phone]) on 03/11/2019 9:09:16 PM   Radiology Dg Chest 2 View  Result Date: 03/11/2019 CLINICAL DATA:  78 year old male with shortness of breath and cough EXAM: CHEST - 2 VIEW COMPARISON:  None. FINDINGS: Cardiomediastinal silhouette enlarged, with surgical changes of median sternotomy, CABG, and TAVR. Thickening of the minor fissure. Blunting of the bilateral costophrenic angles with meniscus on the lateral view. Mild interlobular septal thickening. No pneumothorax. No confluent airspace disease. No displaced fractures. Posttraumatic deformity/postsurgical deformity of right ribs. IMPRESSION: Evidence of pulmonary edema and bilateral pleural effusions with associated atelectasis. Surgical changes of median sternotomy, CABG, and TAVR, with cardiomegaly. Electronically Signed   By: Corrie Mckusick D.O.   On: 03/11/2019 09:11   Dg Chest Port 1 View  Result Date: 03/11/2019 CLINICAL DATA:  Shortness of breath EXAM: PORTABLE CHEST 1 VIEW COMPARISON:  09/21/2018 FINDINGS: Mild cardiomegaly. Remote median sternotomy. No pulmonary edema or focal airspace consolidation. Bibasilar atelectasis. No  pleural effusion or pneumothorax. IMPRESSION: No active disease. Electronically Signed   By: Ulyses Jarred M.D.   On: 03/11/2019 21:23    Procedures Procedures (including critical care time)  Medications Ordered in ED Medications - No data to display   Initial Impression / Assessment and Plan / ED Course  I have reviewed the triage vital signs and the nursing notes.  Pertinent labs & imaging results that were available during my care of the patient were reviewed by me and considered in my medical decision making (see chart for details).        Elderly male with multiple medical problems presenting today with symptoms most suggestive of CHF exacerbation.  He denies any chest pain at this time but even with mild movement he becomes extremely tachypneic and oxygen saturation will drop to 90% on room air.  Patient does remain above 90 and improves with rest.  He appears fluid overloaded on exam.  He is having a cough with intermittent sputum but denies fever.  He does not use inhalers at home but does have a diagnosis of COPD.  Patient's labs show worsening renal function now creatinine of 3.42 from 3 earlier this month.  CBC with normal white count and stable hemoglobin.  BNP is elevated from baseline to 500.  Coronavirus testing pending.  Patient's chest x-ray earlier today with bilateral pulmonary edema and pleural effusion.  Troponin and COVID testing are pending.  Patient given IV Lasix and will admit for CHF exacerbation.  Echo from approximately 1 year ago showed that patient had an EF of 60 to 65%.  Final Clinical Impressions(s) / ED Diagnoses  Final diagnoses:  Acute on chronic congestive heart failure, unspecified heart failure type [facility])    ED Discharge Orders    None       Blanchie Dessert, MD 03/11/19 2259

## 2019-03-11 NOTE — ED Notes (Signed)
ED TO INPATIENT HANDOFF REPORT  ED Nurse Name and Phone #: Tray [place], [phone]  S Name/Age/Gender Cameron [name] 78 y.o. male Room/Bed: 023C/023C  Code Status   Code Status: DNR  Home/SNF/Other Home Patient oriented to: self, place, time and situation Is this baseline? Yes   Triage Complete: Triage complete  Chief Complaint sob  Triage Note Pt states that he has had increased SOB for the past several weeks, along with a cough, productive yellow sputum, no fevers. Increased lasix today without relief. Denies CP   Allergies Allergies  Allergen Reactions  . Cefuroxime Diarrhea  . Ezetimibe Diarrhea    Level of Care/Admitting Diagnosis ED Disposition    ED Disposition Condition [facility] Area: [place] [[id number]]  Level of Care: Telemetry Cardiac [103]  I expect the patient will be discharged within 24 hours: No (not a candidate for 5C-Observation unit)  Covid Evaluation: N/A  Diagnosis: Acute on chronic diastolic (congestive) heart failure [facility]) [[id number]]  Admitting Physician: Ivor Costa [[id number]]  Attending Physician: Ivor Costa [[id number]]  PT Class (Do Not Modify): Observation [104]  PT Acc Code (Do Not Modify): Observation [10022]       B Medical/Surgery History Past Medical History:  Diagnosis Date  . CAD (coronary artery disease)    a. 2002: s/p CABG x 4V (LIMA-LAD, seq SVG-PDA, PLA, seq SVG-OM1, OM2, SVG-Diag1)  . CKD (chronic kidney disease)   . COPD (chronic obstructive pulmonary disease) ([place])   . Diabetes mellitus without complication ([place])   . HLD (hyperlipidemia)   . Hypertension   . Obesity   . PAF (paroxysmal atrial fibrillation) ([place])   . RBBB   . 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 Severe aortic stenosis    Past Surgical History:  Procedure Laterality Date  . ABDOMINAL AORTAGRAM Right 01/28/2018   LOWER  EXTREMITY  . ABDOMINAL AORTOGRAM W/LOWER EXTREMITY N/A 01/28/2018   Procedure: ABDOMINAL AORTOGRAM W/LOWER EXTREMITY [place];  Surgeon: Nigel Mormon, MD;  Location: [place] [facility];  Service: Cardiovascular;  Laterality: N/A;  . CARDIAC SURGERY    . CORONARY BALLOON ANGIOPLASTY N/A 09/23/2017   Procedure: CORONARY BALLOON ANGIOPLASTY;  Surgeon: Nigel Mormon, MD;  Location: [place] [facility];  Service: Cardiovascular;  Laterality: N/A;  . FINGER AMPUTATION    . HERNIA REPAIR    . open heart surgery    . PERIPHERAL VASCULAR BALLOON ANGIOPLASTY  01/28/2018   Procedure: PERIPHERAL VASCULAR BALLOON ANGIOPLASTY;  Surgeon: Nigel Mormon, MD;  Location: [place] [facility];  Service: Cardiovascular;;  Rt SFA  . RIGHT HEART CATH AND CORONARY/GRAFT ANGIOGRAPHY N/A 09/17/2017   Procedure: RIGHT HEART CATH AND CORONARY/GRAFT ANGIOGRAPHY;  Surgeon: Nigel Mormon, MD;  Location: [place] [facility];  Service: Cardiovascular;  Laterality: N/A;  . TEE WITHOUT CARDIOVERSION N/A 12/03/2017   Procedure: TRANSESOPHAGEAL ECHOCARDIOGRAM (TEE);  Surgeon: Sherren Mocha, MD;  Location: [facility];  Service: Open Heart Surgery;  Laterality: N/A;  . TOE AMPUTATION    . TRANSCATHETER AORTIC VALVE REPLACEMENT, TRANSFEMORAL N/A 12/03/2017   Procedure: TRANSCATHETER AORTIC VALVE REPLACEMENT, TRANSFEMORAL;  Surgeon: Sherren Mocha, MD;  Location: [place];  Service: Open Heart Surgery;  Laterality: N/A;  . ULTRASOUND GUIDANCE FOR VASCULAR ACCESS  09/17/2017   Procedure: Ultrasound Guidance For Vascular Access;  Surgeon: Nigel Mormon, MD;  Location: [place] [facility];  Service: Cardiovascular;;  A IV Location/Drains/Wounds Patient Lines/Drains/Airways Status   Active Line/Drains/Airways    Name:   Placement date:   Placement time:   Site:   Days:   Peripheral IV 03/11/19 Right Antecubital   03/11/19    2121    Antecubital   less than 1   Incision (Closed) 12/03/17 Groin Left   12/03/17     1510     463   Incision (Closed) 12/03/17 Groin Right   12/03/17    1510     463          Intake/Output Last 24 hours No intake or output data in the 24 hours ending 03/11/19 2357  Labs/Imaging Results for orders placed or performed during the hospital encounter of 03/11/19 (from the past 48 hour(s))  Basic metabolic panel     Status: Abnormal   Collection Time: 03/11/19  7:33 PM  Result Value Ref Range   Sodium 143 135 - 145 mmol/L   Potassium 4.5 3.5 - 5.1 mmol/L   Chloride 103 98 - 111 mmol/L   CO2 30 22 - 32 mmol/L   Glucose, Bld 128 (H) 70 - 99 mg/dL   BUN 52 (H) 8 - 23 mg/dL   Creatinine, Ser 3.42 (H) 0.61 - 1.24 mg/dL   Calcium 8.3 (L) 8.9 - 10.3 mg/dL   GFR calc non Af Amer 16 (L) >60 mL/min   GFR calc Af Amer 19 (L) >60 mL/min   Anion gap 10 5 - 15    Comment: Performed at [facility], [address], [place], [place] [address]  CBC     Status: Abnormal   Collection Time: 03/11/19  7:33 PM  Result Value Ref Range   WBC 9.8 4.0 - 10.5 K/uL   RBC 4.27 4.22 - 5.81 MIL/uL   Hemoglobin 10.9 (L) 13.0 - 17.0 g/dL   HCT 36.9 (L) 39.0 - 52.0 %   MCV 86.4 80.0 - 100.0 fL   MCH 25.5 (L) 26.0 - 34.0 pg   MCHC 29.5 (L) 30.0 - 36.0 g/dL   RDW 17.4 (H) 11.5 - 15.5 %   Platelets 160 150 - 400 K/uL   nRBC 0.0 0.0 - 0.2 %    Comment: Performed at [facility], [place] [address] [place], [place] [address]  Brain natriuretic peptide     Status: Abnormal   Collection Time: 03/11/19  7:33 PM  Result Value Ref Range   B Natriuretic Peptide 513.3 (H) 0.0 - 100.0 pg/mL    Comment: Performed at [facility] [address], [place], [place] [address]  SARS Coronavirus 2 (CEPHEID- Performed in [place] hospital lab), Hosp Order     Status: None   Collection Time: 03/11/19  8:54 PM  Result Value Ref Range   SARS Coronavirus 2 NEGATIVE NEGATIVE    Comment: (NOTE) If result is NEGATIVE SARS-CoV-2 target nucleic acids are NOT DETECTED. The SARS-CoV-2 RNA is  generally detectable in upper and lower  respiratory specimens during the acute phase of infection. The lowest  concentration of SARS-CoV-2 viral copies this assay can detect is 250  copies / mL. A negative result does not preclude SARS-CoV-2 infection  and should not be used as the sole basis for treatment or other  patient management decisions.  A negative result may occur with  improper specimen collection / handling, submission of specimen other  than nasopharyngeal swab, presence of viral mutation(s) within the  areas targeted by this assay, and  inadequate number of viral copies  (<250 copies / mL). A negative result must be combined with clinical  observations, patient history, and epidemiological information. If result is POSITIVE SARS-CoV-2 target nucleic acids are DETECTED. The SARS-CoV-2 RNA is generally detectable in upper and lower  respiratory specimens dur ing the acute phase of infection.  Positive  results are indicative of active infection with SARS-CoV-2.  Clinical  correlation with patient history and other diagnostic information is  necessary to determine patient infection status.  Positive results do  not rule out bacterial infection or co-infection with other viruses. If result is PRESUMPTIVE POSTIVE SARS-CoV-2 nucleic acids MAY BE PRESENT.   A presumptive positive result was obtained on the submitted specimen  and confirmed on repeat testing.  While 2019 novel coronavirus  (SARS-CoV-2) nucleic acids may be present in the submitted sample  additional confirmatory testing may be necessary for epidemiological  and / or clinical management purposes  to differentiate between  SARS-CoV-2 and other Sarbecovirus currently known to infect humans.  If clinically indicated additional testing with an alternate test  methodology [phone]) is advised. The SARS-CoV-2 RNA is generally  detectable in upper and lower respiratory sp ecimens during the acute  phase of  infection. The expected result is Negative. Fact Sheet for Patients:  StrictlyIdeas.no Fact Sheet for Healthcare Providers: BankingDealers.co.za This test is not yet approved or cleared by the [place] FDA and has been authorized for detection and/or diagnosis of SARS-CoV-2 by FDA under an Emergency Use Authorization (EUA).  This EUA will remain in effect (meaning this test can be used) for the duration of the COVID-19 declaration under Section 564(b)(1) of the Act, 21 U.S.C. section 360bbb-3(b)(1), unless the authorization is terminated or revoked sooner. Performed at [facility], [place] [address] [place], [place] [address]   Troponin I - ONCE - STAT     Status: None   Collection Time: 03/11/19 10:47 PM  Result Value Ref Range   Troponin I <0.03 <0.03 ng/mL    Comment: Performed at [facility], [place] [address] [place], [place] [address]   Dg Chest 2 View  Result Date: 03/11/2019 CLINICAL DATA:  78 year old male with shortness of breath and cough EXAM: CHEST - 2 VIEW COMPARISON:  None. FINDINGS: Cardiomediastinal silhouette enlarged, with surgical changes of median sternotomy, CABG, and TAVR. Thickening of the minor fissure. Blunting of the bilateral costophrenic angles with meniscus on the lateral view. Mild interlobular septal thickening. No pneumothorax. No confluent airspace disease. No displaced fractures. Posttraumatic deformity/postsurgical deformity of right ribs. IMPRESSION: Evidence of pulmonary edema and bilateral pleural effusions with associated atelectasis. Surgical changes of median sternotomy, CABG, and TAVR, with cardiomegaly. Electronically Signed   By: Corrie Mckusick D.O.   On: 03/11/2019 09:11   Dg Chest Port 1 View  Result Date: 03/11/2019 CLINICAL DATA:  Shortness of breath EXAM: PORTABLE CHEST 1 VIEW COMPARISON:  09/21/2018 FINDINGS: Mild cardiomegaly. Remote median sternotomy. No pulmonary edema or focal  airspace consolidation. Bibasilar atelectasis. No pleural effusion or pneumothorax. IMPRESSION: No active disease. Electronically Signed   By: Ulyses Jarred M.D.   On: 03/11/2019 21:23    Pending Labs Unresulted Labs (From admission, onward)    Start     Ordered   03/12/19 4403  Basic metabolic panel  Daily,   R     03/11/19 2322          Vitals/Pain Today's Vitals   03/11/19 1917 03/11/19 1918 03/11/19 2100 03/11/19 2317  BP: (!) 134/58   (!) 109/56  Pulse: 85   (!) 37  Resp: 18   20  Temp: 98.1 F (36.7 C)     TempSrc: Oral     SpO2: 94%   100%  PainSc:  0-No pain 0-No pain     Isolation Precautions No active isolations  Medications Medications  furosemide (LASIX) injection 60 mg (has no administration in time range)  albuterol (PROVENTIL) (2.5 MG/3ML) 0.083% nebulizer solution 3 mL (has no administration in time range)  dextromethorphan-guaiFENesin (MUCINEX DM) 30-600 MG per 12 hr tablet 1 tablet (has no administration in time range)  insulin aspart (novoLOG) injection 0-9 Units (has no administration in time range)  furosemide (LASIX) injection 40 mg (has no administration in time range)  sodium chloride flush (NS) 0.9 % injection 3 mL (has no administration in time range)  sodium chloride flush (NS) 0.9 % injection 3 mL (has no administration in time range)  0.9 %  sodium chloride infusion (has no administration in time range)  acetaminophen (TYLENOL) tablet 650 mg (has no administration in time range)  ondansetron (ZOFRAN) injection 4 mg (has no administration in time range)  aspirin EC tablet 81 mg (has no administration in time range)  hydrALAZINE (APRESOLINE) injection 5 mg (has no administration in time range)  amLODipine (NORVASC) tablet 10 mg (has no administration in time range)  metoprolol succinate (TOPROL-XL) 24 hr tablet 100 mg (has no administration in time range)  nitroGLYCERIN (NITROSTAT) SL tablet 0.4 mg (has no administration in time range)   rosuvastatin (CRESTOR) tablet 40 mg (has no administration in time range)  insulin NPH Human (NOVOLIN N) injection 150 Units (has no administration in time range)  mirabegron ER (MYRBETRIQ) tablet 25 mg (has no administration in time range)  apixaban (ELIQUIS) tablet 5 mg (has no administration in time range)  ipratropium (ATROVENT) nebulizer solution 0.5 mg (has no administration in time range)    Mobility walks with device Low fall risk   Focused Assessments Pulmonary Assessment Handoff:  Lung sounds: Bilateral Breath Sounds: Diminished O2 Device: Nasal Cannula O2 Flow Rate (L/min): 3 L/min      R Recommendations: See Admitting Provider Note  Report given to:   Additional Notes:

## 2019-03-12 ENCOUNTER — Other Ambulatory Visit: Payer: Self-pay

## 2019-03-12 ENCOUNTER — Observation Stay (HOSPITAL_COMMUNITY): Payer: Medicare Other

## 2019-03-12 DIAGNOSIS — I361 Nonrheumatic tricuspid (valve) insufficiency: Secondary | ICD-10-CM

## 2019-03-12 DIAGNOSIS — I5082 Biventricular heart failure: Secondary | ICD-10-CM | POA: Diagnosis not present

## 2019-03-12 DIAGNOSIS — I480 Paroxysmal atrial fibrillation: Secondary | ICD-10-CM | POA: Diagnosis not present

## 2019-03-12 DIAGNOSIS — E1165 Type 2 diabetes mellitus with hyperglycemia: Secondary | ICD-10-CM | POA: Diagnosis present

## 2019-03-12 DIAGNOSIS — Z794 Long term (current) use of insulin: Secondary | ICD-10-CM

## 2019-03-12 DIAGNOSIS — I4821 Permanent atrial fibrillation: Secondary | ICD-10-CM | POA: Diagnosis present

## 2019-03-12 DIAGNOSIS — E1122 Type 2 diabetes mellitus with diabetic chronic kidney disease: Secondary | ICD-10-CM | POA: Diagnosis not present

## 2019-03-12 DIAGNOSIS — I890 Lymphedema, not elsewhere classified: Secondary | ICD-10-CM | POA: Diagnosis present

## 2019-03-12 DIAGNOSIS — R079 Chest pain, unspecified: Secondary | ICD-10-CM | POA: Diagnosis present

## 2019-03-12 DIAGNOSIS — J449 Chronic obstructive pulmonary disease, unspecified: Secondary | ICD-10-CM | POA: Diagnosis present

## 2019-03-12 DIAGNOSIS — N184 Chronic kidney disease, stage 4 (severe): Secondary | ICD-10-CM | POA: Diagnosis not present

## 2019-03-12 DIAGNOSIS — Z87891 Personal history of nicotine dependence: Secondary | ICD-10-CM | POA: Diagnosis not present

## 2019-03-12 DIAGNOSIS — Z951 Presence of aortocoronary bypass graft: Secondary | ICD-10-CM | POA: Diagnosis not present

## 2019-03-12 DIAGNOSIS — I10 Essential (primary) hypertension: Secondary | ICD-10-CM | POA: Diagnosis not present

## 2019-03-12 DIAGNOSIS — Z7901 Long term (current) use of anticoagulants: Secondary | ICD-10-CM | POA: Diagnosis not present

## 2019-03-12 DIAGNOSIS — I340 Nonrheumatic mitral (valve) insufficiency: Secondary | ICD-10-CM

## 2019-03-12 DIAGNOSIS — L89322 Pressure ulcer of left buttock, stage 2: Secondary | ICD-10-CM | POA: Diagnosis present

## 2019-03-12 DIAGNOSIS — J441 Chronic obstructive pulmonary disease with (acute) exacerbation: Secondary | ICD-10-CM | POA: Diagnosis not present

## 2019-03-12 DIAGNOSIS — J9621 Acute and chronic respiratory failure with hypoxia: Secondary | ICD-10-CM | POA: Diagnosis not present

## 2019-03-12 DIAGNOSIS — I25118 Atherosclerotic heart disease of native coronary artery with other forms of angina pectoris: Secondary | ICD-10-CM | POA: Diagnosis not present

## 2019-03-12 DIAGNOSIS — I5033 Acute on chronic diastolic (congestive) heart failure: Secondary | ICD-10-CM | POA: Diagnosis not present

## 2019-03-12 DIAGNOSIS — I130 Hypertensive heart and chronic kidney disease with heart failure and stage 1 through stage 4 chronic kidney disease, or unspecified chronic kidney disease: Secondary | ICD-10-CM | POA: Diagnosis present

## 2019-03-12 DIAGNOSIS — Z888 Allergy status to other drugs, medicaments and biological substances status: Secondary | ICD-10-CM | POA: Diagnosis not present

## 2019-03-12 DIAGNOSIS — I2781 Cor pulmonale (chronic): Secondary | ICD-10-CM | POA: Diagnosis not present

## 2019-03-12 DIAGNOSIS — I2510 Atherosclerotic heart disease of native coronary artery without angina pectoris: Secondary | ICD-10-CM | POA: Diagnosis present

## 2019-03-12 DIAGNOSIS — E669 Obesity, unspecified: Secondary | ICD-10-CM | POA: Diagnosis present

## 2019-03-12 DIAGNOSIS — L89311 Pressure ulcer of right buttock, stage 1: Secondary | ICD-10-CM | POA: Diagnosis present

## 2019-03-12 DIAGNOSIS — I272 Pulmonary hypertension, unspecified: Secondary | ICD-10-CM | POA: Diagnosis not present

## 2019-03-12 DIAGNOSIS — I2729 Other secondary pulmonary hypertension: Secondary | ICD-10-CM | POA: Diagnosis not present

## 2019-03-12 DIAGNOSIS — Z20828 Contact with and (suspected) exposure to other viral communicable diseases: Secondary | ICD-10-CM | POA: Diagnosis present

## 2019-03-12 DIAGNOSIS — Z953 Presence of xenogenic heart valve: Secondary | ICD-10-CM | POA: Diagnosis not present

## 2019-03-12 DIAGNOSIS — I4510 Unspecified right bundle-branch block: Secondary | ICD-10-CM | POA: Diagnosis present

## 2019-03-12 DIAGNOSIS — E785 Hyperlipidemia, unspecified: Secondary | ICD-10-CM | POA: Diagnosis present

## 2019-03-12 LAB — GLUCOSE, CAPILLARY
Glucose-Capillary: 121 mg/dL — ABNORMAL HIGH (ref 70–99)
Glucose-Capillary: 135 mg/dL — ABNORMAL HIGH (ref 70–99)
Glucose-Capillary: 213 mg/dL — ABNORMAL HIGH (ref 70–99)
Glucose-Capillary: 290 mg/dL — ABNORMAL HIGH (ref 70–99)
Glucose-Capillary: 56 mg/dL — ABNORMAL LOW (ref 70–99)
Glucose-Capillary: 59 mg/dL — ABNORMAL LOW (ref 70–99)
Glucose-Capillary: 77 mg/dL (ref 70–99)

## 2019-03-12 LAB — BASIC METABOLIC PANEL
Anion gap: 11 (ref 5–15)
BUN: 53 mg/dL — ABNORMAL HIGH (ref 8–23)
CO2: 31 mmol/L (ref 22–32)
Calcium: 8.4 mg/dL — ABNORMAL LOW (ref 8.9–10.3)
Chloride: 102 mmol/L (ref 98–111)
Creatinine, Ser: 3.58 mg/dL — ABNORMAL HIGH (ref 0.61–1.24)
GFR calc Af Amer: 18 mL/min — ABNORMAL LOW (ref >60)
GFR calc non Af Amer: 15 mL/min — ABNORMAL LOW (ref >60)
Glucose, Bld: 67 mg/dL — ABNORMAL LOW (ref 70–99)
Potassium: 4.5 mmol/L (ref 3.5–5.1)
Sodium: 144 mmol/L (ref 135–145)

## 2019-03-12 LAB — ECHOCARDIOGRAM COMPLETE
Height: 69 in
Weight: 3796.8 oz

## 2019-03-12 LAB — MRSA PCR SCREENING: MRSA by PCR: POSITIVE — AB

## 2019-03-12 MED ORDER — MUPIROCIN 2 % EX OINT
1.0000 "application " | TOPICAL_OINTMENT | Freq: Two times a day (BID) | CUTANEOUS | Status: AC
  Administered 2019-03-12 – 2019-03-16 (×10): 1 via NASAL
  Filled 2019-03-12 (×3): qty 22

## 2019-03-12 MED ORDER — IPRATROPIUM-ALBUTEROL 0.5-2.5 (3) MG/3ML IN SOLN: 3.0000 mL | RESPIRATORY_TRACT | Status: DC | PRN

## 2019-03-12 MED ORDER — ORAL CARE MOUTH RINSE
15.0000 mL | Freq: Two times a day (BID) | OROMUCOSAL | Status: DC
  Administered 2019-03-12 – 2019-03-17 (×11): 15 mL via OROMUCOSAL

## 2019-03-12 MED ORDER — CHLORHEXIDINE GLUCONATE CLOTH 2 % EX PADS
6.0000 | MEDICATED_PAD | Freq: Every day | CUTANEOUS | Status: AC
  Administered 2019-03-12 – 2019-03-16 (×4): 6 via TOPICAL

## 2019-03-12 MED ORDER — PERFLUTREN LIPID MICROSPHERE
1.0000 mL | INTRAVENOUS | Status: AC | PRN
  Administered 2019-03-12: 3 mL via INTRAVENOUS
  Filled 2019-03-12: qty 10

## 2019-03-12 MED ORDER — FUROSEMIDE 10 MG/ML IJ SOLN
60.0000 mg | Freq: Two times a day (BID) | INTRAMUSCULAR | Status: DC
  Administered 2019-03-12 (×2): 60 mg via INTRAVENOUS
  Filled 2019-03-12 (×2): qty 6

## 2019-03-12 NOTE — Progress Notes (Signed)
PROGRESS NOTE  Cameron [name] WNI:[id number] DOB: 10-15-1941 DOA: 03/11/2019 PCP: Billie Ruddy, MD   LOS: 0 days   Patient is from: Home  Brief Narrative / Interim history: 78 year old male with history of hypertension, hyperlipidemia, DM-2, COPD not on oxygen, CAD status post CABG, CKD 4, RBBB, diastolic CHF, PAF on Eliquis, aortic stenosis status post TAVR, obesity and venous insufficiency presenting with shortness of breath and admitted for CHF exacerbation.  In ED, hemodynamically stable.  Saturation dropped to 89% and he was put on 3 L by nasal cannula.  BNP 513 (higher than baseline).  EKG A. fib without RVR, LAD and RBBB. Renal function is slightly worse from baseline.  COVID-19 test negative.  Started on IV Lasix and admitted.  Subjective: Had hypoglycemia to 56 earlier this morning.  He says he has not noticed significant improvement in his breathing.  Normally sleeps on chair.  Reports worsening edema as well.  He says he gets short of breath just by walking around his bed.  Reports compliance with diuretics.  Watch his salt intake.  Does not take NSAIDs.  Occasional beer, never more than recommended.  Assessment & Plan: Acute on chronic diastolic CHF/NYHA III-IV: has cardinal symptoms including dyspnea, orthopnea and LE edema.  Last echo about a year ago with normal EF, G2DD.  Fair urine output with IV Lasix so far.  However, did not appreciate significant improvement in his breathing. -Continue IV Lasix 60 mg twice daily -Follow echocardiogram -We will monitor daily weight, intake output and renal function -Salt and fluid restrictions -[facility] cardiology, Dr. Virgina Jock  Chronic COPD: do not engage in exacerbation -Continue PRN breathing treatments  History of CAD status post CABG: No chest pain.  EKG and troponin reassuring so far. 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  Apparently on extremely high-dose insulin at home.  Became hypoglycemic here without insulin. -Recheck A1c -Continue sliding scale insulin -Continue NPH, will reinitiate basal insulin based on his CBGs -Continue statin -Check lipid panel  Permanent atrial fibrillation: On metoprolol and Eliquis at home -Continue home medications  Hypertension: Normotensive -Continue amlodipine and cardiac meds as above -Amlodipine could contribute to his leg swelling.  Scheduled Meds: . amLODipine  10 mg Oral Daily  . apixaban  5 mg Oral BID  . aspirin EC  81 mg Oral Daily  . Chlorhexidine Gluconate Cloth  6 each Topical Q0600  . furosemide  60 mg Intravenous BID  . insulin aspart  0-9 Units Subcutaneous TID WC  . insulin NPH Human  150 Units Subcutaneous QAC breakfast  . ipratropium  0.5 mg Inhalation TID  . mouth rinse  15 mL Mouth Rinse BID  . metoprolol succinate  100 mg Oral Daily  . mirabegron ER  25 mg Oral Daily  . mupirocin ointment  1 application Nasal BID  . rosuvastatin  40 mg Oral QPC supper  . sodium chloride flush  3 mL Intravenous Q12H   Continuous Infusions: . sodium chloride     PRN Meds:.sodium chloride, acetaminophen, albuterol, dextromethorphan-guaiFENesin, hydrALAZINE, nitroGLYCERIN, ondansetron (ZOFRAN) IV, sodium chloride flush   DVT prophylaxis: On Eliquis for A. fib Code Status: DNR Family Communication: Updated patient's wife over the phone. Disposition Plan: Remains inpatient for adequate diuresis with IV Lasix.   Consultants:   Cardiology  Procedures:   None  Microbiology: . COVID-19 negative . MRSA PCR positive  Antimicrobials: Anti-infectives (From admission, onward)   None       Objective: Vitals:   03/12/19 0432 03/12/19 0835 03/12/19 0847 03/12/19 1224  BP: 121/63 102/62  (!) 126/53  Pulse: 85 82  83  Resp: 18 18  18   Temp: 98.7 F  (37.1 C) 98.1 F (36.7 C)  97.8 F (36.6 C)  TempSrc: Oral Oral  Oral  SpO2: 97% 97% 97% 100%  Weight:      Height:        Intake/Output Summary (Last 24 hours) at 03/12/2019 1338 Last data filed at 03/12/2019 1300 Gross per 24 hour  Intake 1440 ml  Output 1900 ml  Net -460 ml   Filed Weights   03/12/19 0024  Weight: 107.6 kg    Examination:  GENERAL: No acute distress.  Appears well.  HEENT: MMM.  Vision and hearing grossly intact.  NECK: Supple.  Difficult to assess JVD LUNGS:  No IWOB.  Diminished aeration bibasilarly.  Fine bibasilar crackles HEART:  RRR. Heart sounds normal.  ABD: Bowel sounds present. Soft. Non tender.  MSK/EXT:  Moves all extremities.  Significant venous insufficiency with tree bark skin bilaterally SKIN: Significant venous insufficiency with 3 bark skin bilaterally NEURO: Awake, alert and oriented appropriately.  No gross deficit.  PSYCH: Calm. Normal affect.    Data Reviewed: I have independently reviewed following labs and imaging studies  CBC: Recent Labs  Lab 03/11/19 1933  WBC 9.8  HGB 10.9*  HCT 36.9*  MCV 86.4  PLT 315   Basic Metabolic Panel: Recent Labs  Lab 03/10/19 1605 03/11/19 1933 03/12/19 0557  NA 143 143 144  K 4.9 4.5 4.5  CL 103 103 102  CO2 34* 30 31  GLUCOSE 171* 128* 67*  BUN 52* 52* 53*  CREATININE 3.00* 3.42* 3.58*  CALCIUM 8.4 8.3* 8.4*   GFR: Estimated Creatinine Clearance: 20.6 mL/min (A) (by C-G formula based on SCr of 3.58 mg/dL (H)). Liver Function Tests: No results for input(s): AST, ALT, ALKPHOS, BILITOT, PROT, ALBUMIN in the last 168 hours. No results for input(s): LIPASE, AMYLASE in the last 168 hours. No results for input(s): AMMONIA in the last 168 hours. Coagulation Profile: No results for input(s): INR, PROTIME in the last 168 hours. Cardiac Enzymes: Recent Labs  Lab 03/11/19 2247  TROPONINI <0.03   BNP (last 3 results) Recent Labs    05/05/18 1535 02/26/19 0958 03/10/19 1605   PROBNP 88.0 399.0* 420.0*   HbA1C: No results for input(s): HGBA1C in the last 72 hours. CBG: Recent Labs  Lab 03/12/19 0053 03/12/19 0607 03/12/19 0633 03/12/19 0655 03/12/19 1128  GLUCAP 135* 56* 59* 77 121*   Lipid Profile: No results for input(s): CHOL, HDL, LDLCALC, TRIG, CHOLHDL, LDLDIRECT in the last 72 hours. Thyroid Function Tests: No results for input(s): TSH, T4TOTAL, FREET4, T3FREE, THYROIDAB in the last 72 hours. Anemia Panel: No results for input(s): VITAMINB12, FOLATE, FERRITIN, TIBC, IRON, RETICCTPCT in the last 72 hours. Urine analysis:    Component Value Date/Time   COLORURINE YELLOW 02/16/2018 1450   APPEARANCEUR HAZY (A) 02/16/2018 1450   LABSPEC 1.015 02/16/2018 1450   PHURINE 5.0 02/16/2018 1450   GLUCOSEU >=500 (A) 02/16/2018 1450   HGBUR LARGE (A) 02/16/2018 1450   BILIRUBINUR n 02/26/2019 1035   KETONESUR NEGATIVE 02/16/2018 1450   PROTEINUR Positive (A) 02/26/2019 1035   PROTEINUR 100 (A) 02/16/2018 1450   UROBILINOGEN 0.2 02/26/2019 1035   UROBILINOGEN  1.0 02/22/2013 1318   NITRITE n 02/26/2019 1035   NITRITE NEGATIVE 02/16/2018 1450   LEUKOCYTESUR Negative 02/26/2019 1035   Sepsis Labs: Invalid input(s): PROCALCITONIN, LACTICIDVEN  Recent Results (from the past 240 hour(s))  SARS Coronavirus 2 (CEPHEID- Performed in [place] hospital lab), Hosp Order     Status: None   Collection Time: 03/11/19  8:54 PM  Result Value Ref Range Status   SARS Coronavirus 2 NEGATIVE NEGATIVE Final    Comment: (NOTE) If result is NEGATIVE SARS-CoV-2 target nucleic acids are NOT DETECTED. The SARS-CoV-2 RNA is generally detectable in upper and lower  respiratory specimens during the acute phase of infection. The lowest  concentration of SARS-CoV-2 viral copies this assay can detect is 250  copies / mL. A negative result does not preclude SARS-CoV-2 infection  and should not be used as the sole basis for treatment or other  patient management decisions.   A negative result may occur with  improper specimen collection / handling, submission of specimen other  than nasopharyngeal swab, presence of viral mutation(s) within the  areas targeted by this assay, and inadequate number of viral copies  (<250 copies / mL). A negative result must be combined with clinical  observations, patient history, and epidemiological information. If result is POSITIVE SARS-CoV-2 target nucleic acids are DETECTED. The SARS-CoV-2 RNA is generally detectable in upper and lower  respiratory specimens dur ing the acute phase of infection.  Positive  results are indicative of active infection with SARS-CoV-2.  Clinical  correlation with patient history and other diagnostic information is  necessary to determine patient infection status.  Positive results do  not rule out bacterial infection or co-infection with other viruses. If result is PRESUMPTIVE POSTIVE SARS-CoV-2 nucleic acids MAY BE PRESENT.   A presumptive positive result was obtained on the submitted specimen  and confirmed on repeat testing.  While 2019 novel coronavirus  (SARS-CoV-2) nucleic acids may be present in the submitted sample  additional confirmatory testing may be necessary for epidemiological  and / or clinical management purposes  to differentiate between  SARS-CoV-2 and other Sarbecovirus currently known to infect humans.  If clinically indicated additional testing with an alternate test  methodology [phone]) is advised. The SARS-CoV-2 RNA is generally  detectable in upper and lower respiratory sp ecimens during the acute  phase of infection. The expected result is Negative. Fact Sheet for Patients:  StrictlyIdeas.no Fact Sheet for Healthcare Providers: BankingDealers.co.za This test is not yet approved or cleared by the [place] FDA and has been authorized for detection and/or diagnosis of SARS-CoV-2 by FDA under an Emergency Use  Authorization (EUA).  This EUA will remain in effect (meaning this test can be used) for the duration of the COVID-19 declaration under Section 564(b)(1) of the Act, 21 U.S.C. section 360bbb-3(b)(1), unless the authorization is terminated or revoked sooner. Performed at [facility], [place] [address], [place], [place] [address]   MRSA PCR Screening     Status: Abnormal   Collection Time: 03/12/19 12:49 AM  Result Value Ref Range Status   MRSA by PCR POSITIVE (A) NEGATIVE Final    Comment:        The GeneXpert MRSA Assay (FDA approved for NASAL specimens only), is one component of a comprehensive MRSA colonization surveillance program. It is not intended to diagnose MRSA infection nor to guide or monitor treatment for MRSA infections. RESULT CALLED TO, READ BACK BY AND VERIFIED WITH: THOMAS,J RN 03/12/2019 AT [place] SKEEN,P Performed  at [facility], [place] [address] [place], [place] [address]       Radiology Studies: Dg Chest Port 1 View  Result Date: 03/11/2019 CLINICAL DATA:  Shortness of breath EXAM: PORTABLE CHEST 1 VIEW COMPARISON:  09/21/2018 FINDINGS: Mild cardiomegaly. Remote median sternotomy. No pulmonary edema or focal airspace consolidation. Bibasilar atelectasis. No pleural effusion or pneumothorax. IMPRESSION: No active disease. Electronically Signed   By: Ulyses Jarred M.D.   On: 03/11/2019 21:23     T[facility] Pager [phone]  If 7PM-7AM, please contact night-coverage [email or web address] Password [facility] 03/12/2019, 1:38 PM

## 2019-03-12 NOTE — Plan of Care (Signed)
  Problem: Education: Goal: Knowledge of General Education information will improve Description: Including pain rating scale, medication(s)/side effects and non-pharmacologic comfort measures Outcome: Progressing   Problem: Health Behavior/Discharge Planning: Goal: Ability to manage health-related needs will improve Outcome: Progressing   Problem: Clinical Measurements: Goal: Respiratory complications will improve Outcome: Progressing   

## 2019-03-12 NOTE — Consult Note (Signed)
WOC reviewed chart and MD notes. Discussed patient with bedside nurse. 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  [place] nurse will come by to provide resources for outpatient compression/lymphadema therapy if patient is interested   Elevate as much as possible. Patient prefers to have them down and not touching anything.  Weeping from fissured areas consistant with presentation of severe skin changes. Ok to manage with foam as absorbant dressing.   Discussed POC with bedside nurse.  Re consult if needed, will not follow at this time. Thanks  Georgia Baria [employer], RN,CWOCN, CNS, [place] [phone])

## 2019-03-12 NOTE — Plan of Care (Signed)

## 2019-03-12 NOTE — Progress Notes (Signed)
Inpatient Diabetes Program Recommendations  AACE/ADA: New Consensus Statement on Inpatient Glycemic Control (2015)  Target Ranges:  Prepandial:   less than 140 mg/dL      Peak postprandial:   less than 180 mg/dL (1-2 hours)      Critically ill patients:  140 - 180 mg/dL   Results for Cameron [name], Cameron [name] (MRN [id number]) as of 03/12/2019 09:59  Ref. 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 Renato Shin with [facility]--Last seen 11/24/2018.  Was instructed to Stop taking his Regular Short-acting insulin and Increase his NPH Insulin to 200 units Daily.  Prior to this visit, pt was taking Regular Insulin 55 units with Breakfast/ 55 units with Lunch/ 75 units with Dinner.  Was instructed to follow up with Dr. Loanne Drilling in two months after that visit.   HYPO this AM--NO Insulin given yet since admission.  Per MAR, last dose of NPH Insulin that pt took was 05/13 in the AM at home prior to admission.     MD- Hypoglycemia this AM and has not received any Insulin since admission yesterday (05/13).  I question if his worsening renal function is causing him to hold onto insulin longer and predisposing him to Hypoglycemia?  Please consider the following inpatient adjustments for now:  1. Stop NPH Insulin for now  2. Increase frequency of Novolog SSI to Q4 hours  If CBGs become elevated later today, may consider starting weight based dose of basal insulin later today.  Could start with Levemir 20 units QHS (0.2 units/kg dosing based on weight of 107 kg).  Would  not start the Levemir unless CBGs rise throughout the day.     --Will follow patient during hospitalization--  Wyn Quaker RN, MSN, CDE Diabetes Coordinator Inpatient Glycemic Control Team Team Pager: [phone] (8a-5p)

## 2019-03-12 NOTE — Progress Notes (Signed)
Patient arrived to unit, [facility], dyspnea with exertion. BLE with pitting edema, tight, red, has blisters crusts/scabs. Patient VSS. Placed on 4L O2 via nasal cannula.

## 2019-03-12 NOTE — Discharge Instructions (Addendum)
Information on my medicine - ELIQUIS® (apixaban) ° °Why was Eliquis® prescribed for you? °Eliquis® was prescribed for you to reduce the risk of a blood clot forming that can cause a stroke if you have a medical condition called atrial fibrillation (a type of irregular heartbeat). ° °What do You need to know about Eliquis® ? °Take your Eliquis® TWICE DAILY - one tablet in the morning and one tablet in the evening with or without food. If you have difficulty swallowing the tablet whole please discuss with your pharmacist how to take the medication safely. ° °Take Eliquis® exactly as prescribed by your doctor and DO NOT stop taking Eliquis® without talking to the doctor who prescribed the medication.  Stopping may increase your risk of developing a stroke.  Refill your prescription before you run out. ° °After discharge, you should have regular check-up appointments with your healthcare provider that is prescribing your Eliquis®.  In the future your dose may need to be changed if your kidney function or weight changes by a significant amount or as you get older. ° °What do you do if you miss a dose? °If you miss a dose, take it as soon as you remember on the same day and resume taking twice daily.  Do not take more than one dose of ELIQUIS at the same time to make up a missed dose. ° °Important Safety Information °A possible side effect of Eliquis® is bleeding. You should call your healthcare provider right away if you experience any of the following: °? Bleeding from an injury or your nose that does not stop. °? Unusual colored urine (red or dark brown) or unusual colored stools (red or black). °? Unusual bruising for unknown reasons. °? A serious fall or if you hit your head (even if there is no bleeding). ° °Some medicines may interact with Eliquis® and might increase your risk of bleeding or clotting while on Eliquis®. To help avoid this, consult your healthcare provider or pharmacist prior to using any new  prescription or non-prescription medications, including herbals, vitamins, non-steroidal anti-inflammatory drugs (NSAIDs) and supplements. ° °This website has more information on Eliquis® (apixaban): http://www.eliquis.com/eliquis/home ° °Heart Healthy, Consistent Carbohydrate Nutrition Therapy  ° °A heart-healthy and consistent carbohydrate diet is recommended to manage heart disease and diabetes. °To follow a heart-healthy and consistent carbohydrate diet, °• Eat a balanced diet with whole grains, fruits and vegetables, and lean protein sources.  °• Choose heart-healthy unsaturated fats. Limit saturated fats, trans fats, and cholesterol intake. Eat more plant-based or vegetarian meals using beans and soy foods for protein.  °• Eat whole, unprocessed foods to limit the amount of sodium (salt) you eat.  °• Choose a consistent amount of carbohydrate at each meal and snack. Limit refined carbohydrates especially sugar, sweets and sugar-sweetened beverages.  °• If you drink alcohol, do so in moderation: one serving per day (women) and two servings per day (men). °o One serving is equivalent to 12 ounces beer, 5 ounces wine, or 1.5 ounces distilled spirits ° °Tips °Tips for Choosing Heart-Healthy Fats °Choose lean protein and low-fat dairy foods to reduce saturated fat intake. °• Saturated fat is usually found in animal-based protein and is associated with certain health risks. Saturated fat is the biggest contributor to raise low-density lipoprotein (LDL) cholesterol levels. Research shows that limiting saturated fat lowers unhealthy cholesterol levels. Eat no more than 7% of your total calories each day from saturated fat. Ask your RDN to help you determine how much saturated fat   is right for you. °• There are many foods that do not contain large amounts of saturated fats. Swapping these foods to replace foods high in saturated fats will help you limit the saturated fat you eat and improve your cholesterol levels. You  can also try eating more plant-based or vegetarian meals. °Instead of… Try:  °Whole milk, cheese, yogurt, and ice cream 1% or skim milk, low-fat cheese, non-fat yogurt, and low-fat ice cream  °Fatty, marbled beef and pork Lean beef, pork, or venison  °Poultry with skin Poultry without skin  °Butter, stick margarine Reduced-fat, whipped, or liquid spreads  °Coconut oil, palm oil Liquid vegetable oils: corn, canola, olive, soybean and safflower oils  ° °Avoid foods that contain trans fats. °• Trans fats increase levels of LDL-cholesterol. Hydrogenated fat in processed foods is the main source of trans fats in foods.  °• Trans fats can be found in stick margarine, shortening, processed sweets, baked goods, some fried foods, and packaged foods made with hydrogenated oils. Avoid foods with “partially hydrogenated oil” on the ingredient list such as: cookies, pastries, baked goods, biscuits, crackers, microwave popcorn, and frozen dinners. °Choose foods with heart healthy fats. °• Polyunsaturated and monounsaturated fat are unsaturated fats that may help lower your blood cholesterol level when used in place of saturated fat in your diet. °• Ask your RDN about taking a dietary supplement with plant sterols and stanols to help lower your cholesterol level. °• Research shows that substituting saturated fats with unsaturated fats is beneficial to cholesterol levels. Try these easy swaps: °Instead of… Try:  °Butter, stick margarine, or solid shortening Reduced-fat, whipped, or liquid spreads  °Beef, pork, or poultry with skin Fish and seafood  °Chips, crackers, snack foods Raw or unsalted nuts and seeds or nut butters °Hummus with vegetables °Avocado on toast  °Coconut oil, palm oil Liquid vegetable oils: corn, canola, olive, soybean and safflower oils  °Limit the amount of cholesterol you eat to less than 200 milligrams per day. °• Cholesterol is a substance carried through the bloodstream via lipoproteins, which are known as  “transporters” of fat. Some body functions need cholesterol to work properly, but too much cholesterol in the bloodstream can damage arteries and build up blood vessel linings (which can lead to heart attack and stroke). You should eat less than 200 milligrams cholesterol per day. °• People respond differently to eating cholesterol. There is no test available right now that can figure out which people will respond more to dietary cholesterol and which will respond less. For individuals with high intake of dietary cholesterol, different types of increase (none, small, moderate, large) in LDL-cholesterol levels are all possible.  °• Food sources of cholesterol include egg yolks and organ meats such as liver, gizzards. Limit egg yolks to two to four per week and avoid organ meats like liver and gizzards to control cholesterol intake. °Tips for Choosing Heart-Healthy Carbohydrates °Consume a consistent amount of carbohydrate °• It is important to eat foods with carbohydrates in moderation because they impact your blood glucose level. Carbohydrates can be found in many foods such as: °• Grains (breads, crackers, rice, pasta, and cereals)  °• Starchy Vegetables (potatoes, corn, and peas)  °• Beans and legumes  °• Milk, soy milk, and yogurt  °• Fruit and fruit juice  °• Sweets (cakes, cookies, ice cream, jam and jelly) °• Your RDN will help you set a goal for how many carbohydrate servings to eat at your meals and snacks. For many adults, eating 3   to 5 servings of carbohydrate foods at each meal and 1 or 2 carbohydrate servings for each snack works well.  °• Check your blood glucose level regularly. It can tell you if you need to adjust when you eat carbohydrates. °• Choose foods rich in viscous (soluble) fiber °• Viscous, or soluble, is found in the walls of plant cells. Viscous fiber is found only in plant-based foods. Eating foods with fiber helps to lower your unhealthy cholesterol and keep your blood glucose in range   °• Rich sources of viscous fiber include vegetables (asparagus, Brussels sprouts, sweet potatoes, turnips) fruit (apricots, mangoes, oranges), legumes, and whole grains (barley, oats, and oat bran).  °• As you increase your fiber intake gradually, also increase the amount of water you drink. This will help prevent constipation.  °• If you have difficulty achieving this goal, ask your RDN about fiber laxatives. Choose fiber supplements made with viscous fibers such as psyllium seed husks or methylcellulose to help lower unhealthy cholesterol.  °• Limit refined carbohydrates  °• There are three types of carbohydrates: starches, sugar, and fiber. Some carbohydrates occur naturally in food, like the starches in rice or corn or the sugars in fruits and milk. Refined carbohydrates--foods with high amounts of simple sugars--can raise triglyceride levels. High triglyceride levels are associated with coronary heart disease. °• Some examples of refined carbohydrate foods are table sugar, sweets, and beverages sweetened with added sugar. °Tips for Reducing Sodium (Salt) °Although sodium is important for your body to function, too much sodium can be harmful for people with high blood pressure. As sodium and fluid buildup in your tissues and bloodstream, your blood pressure increases. High blood pressure may cause damage to other organs and increase your risk for a stroke. °Even if you take a pill for blood pressure or a water pill (diuretic) to remove fluid, it is still important to have less salt in your diet. Ask your doctor and RDN what amount of sodium is right for you. °• Avoid processed foods. Eat more fresh foods.  °• Fresh fruits and vegetables are naturally low in sodium, as well as frozen vegetables and fruits that have no added juices or sauces.  °• Fresh meats are lower in sodium than processed meats, such as bacon, sausage, and hotdogs. Read the nutrition label or ask your butcher to help you find a fresh meat  that is low in sodium. °• Eat less salt--at the table and when cooking.  °• A single teaspoon of table salt has 2,300 mg of sodium.  °• Leave the salt out of recipes for pasta, casseroles, and soups.  °• Ask your RDN how to cook your favorite recipes without sodium °• Be a smart shopper.  °• Look for food packages that say “salt-free” or “sodium-free.” These items contain less than 5 milligrams of sodium per serving.  °• “Very low-sodium” products contain less than 35 milligrams of sodium per serving.  °• “Low-sodium” products contain less than 140 milligrams of sodium per serving.  °• Beware for “Unsalted” or “No Added Salt” products. These items may still be high in sodium. Check the nutrition label. °• Add flavors to your food without adding sodium.  °• Try lemon juice, lime juice, fruit juice or vinegar.  °• Dry or fresh herbs add flavor. Try basil, bay leaf, dill, rosemary, parsley, sage, dry mustard, nutmeg, thyme, and paprika.  °• Pepper, red pepper flakes, and cayenne pepper can add spice t your meals without adding sodium. Hot sauce contains   sodium, but if you use just a drop or two, it will not add up to much.  °• Buy a sodium-free seasoning blend or make your own at home. °Additional Lifestyle Tips °Achieve and maintain a healthy weight. °• Talk with your RDN or your doctor about what is a healthy weight for you. °• Set goals to reach and maintain that weight.  °• To lose weight, reduce your calorie intake along with increasing your physical activity. A weight loss of 10 to 15 pounds could reduce LDL-cholesterol by 5 milligrams per deciliter. °Participate in physical activity. °• Talk with your health care team to find out what types of physical activity are best for you. Set a plan to get about 30 minutes of exercise on most days. ° °Foods Recommended °Food Group Foods Recommended  °Grains Whole grain breads and cereals, including whole wheat, barley, rye, buckwheat, corn, teff, quinoa, millet, amaranth,  brown or wild rice, sorghum, and oats °Pasta, especially whole wheat or other whole grain types  °Brown rice, quinoa or wild rice °Whole grain crackers, bread, rolls, pitas °Home-made bread with reduced-sodium baking soda  °Protein Foods Lean cuts of beef and pork (loin, leg, round, extra lean hamburger)  °Skinless poultry °Fish °Venison and other wild game °Dried beans and peas °Nuts and nut butters °Meat alternatives made with soy or textured vegetable protein  °Egg whites or egg substitute °Cold cuts made with lean meat or soy protein  °Dairy Nonfat (skim), low-fat, or 1%-fat milk  °Nonfat or low-fat yogurt or cottage cheese °Fat-free and low-fat cheese  °Vegetables Fresh, frozen, or canned vegetables without added fat or salt   °Fruits Fresh, frozen, canned, or dried fruit   °Oils Unsaturated oils (corn, olive, peanut, soy, sunflower, canola)  °Soft or liquid margarines and vegetable oil spreads  °Salad dressings °Seeds and nuts  °Avocado  ° °Foods Not Recommended °Food Group Foods Not Recommended  °Grains Breads or crackers topped with salt °Cereals (hot or cold) with more than 300 mg sodium per serving °Biscuits, cornbread, and other “quick” breads prepared with baking soda °Bread crumbs or stuffing mix from a store °High-fat bakery products, such as doughnuts, biscuits, croissants, danish pastries, pies, cookies °Instant cooking foods to which you add hot water and stir--potatoes, noodles, rice, etc. °Packaged starchy foods--seasoned noodle or rice dishes, stuffing mix, macaroni and cheese dinner °Snacks made with partially hydrogenated oils, including chips, cheese puffs, snack mixes, regular crackers, butter-flavored popcorn  °Protein Foods Higher-fat cuts of meats (ribs, t-bone steak, regular hamburger) °Bacon, sausage, or hot dogs °Cold cuts, such as salami or bologna, deli meats, cured meats, corned beef °Organ meats (liver, brains, gizzards, sweetbreads) °Poultry with skin °Fried or smoked meat,  poultry, and fish °Whole eggs and egg yolks (more than 2-4 per week) °Salted legumes, nuts, seeds, or nut/seed butters °Meat alternatives with high levels of sodium (>300 mg per serving) or saturated fat (>5 g per serving)  °Dairy Whole milk,?2% fat milk, buttermilk °Whole milk yogurt or ice cream °Cream °Half-&-half °Cream cheese °Sour cream °Cheese  °Vegetables Canned or frozen vegetables with salt, fresh vegetables prepared with salt, butter, cheese, or cream sauce °Fried vegetables °Pickled vegetables such as olives, pickles, or sauerkraut  °Fruits Fried fruits °Fruits served with butter or cream  °Oils Butter, stick margarine, shortening °Partially hydrogenated oils or trans fats °Tropical oils (coconut, palm, palm kernel oils)  °Other Candy, sugar sweetened soft drinks and desserts °Salt, sea salt, garlic salt, and seasoning mixes containing salt °Bouillon cubes °  Ketchup, barbecue sauce, Worcestershire sauce, soy sauce, teriyaki sauce Miso Salsa Pickles, olives, relish   Heart Healthy Consistent Carbohydrate Vegetarian (Lacto-Ovo) Sample 1-Day Menu  Breakfast 1 cup oatmeal, cooked (2 carbohydrate servings)   cup blueberries (1 carbohydrate serving)  11 almonds, without salt  1 cup 1% milk (1 carbohydrate serving)  1 cup coffee  Morning Snack 1 cup fat-free plain yogurt (1 carbohydrate serving)  Lunch 1 whole wheat bun (1 carbohydrate servings)  1 black bean burger (1 carbohydrate servings)  1 slice cheddar cheese, low sodium  2 slices tomatoes  2 leaves lettuce  1 teaspoon mustard  1 small pear (1 carbohydrate servings)  1 cup green tea, unsweetened  Afternoon Snack 1/3 cup trail mix with nuts, seeds, and raisins, without salt (1 carbohydrate servinga)  Evening Meal  cup meatless chicken  2/3 cup brown rice, cooked (2 carbohydrate servings)  1 cup broccoli, cooked (2/3 carbohydrate serving)   cup carrots, cooked (1/3 carbohydrate serving)  2 teaspoons olive oil  1 teaspoon  balsamic vinegar  1 whole wheat dinner roll (1 carbohydrate serving)  1 teaspoon margarine, soft, tub  1 cup 1% milk (1 carbohydrate serving)  Evening Snack 1 extra small banana (1 carbohydrate serving)  1 tablespoon peanut butter   Heart Healthy Consistent Carbohydrate Vegan Sample 1-Day Menu  Breakfast 1 cup oatmeal, cooked (2 carbohydrate servings)   cup blueberries (1 carbohydrate serving)  11 almonds, without salt  1 cup soymilk fortified with calcium, vitamin B12, and vitamin D  1 cup coffee  Morning Snack 6 ounces soy yogurt (1 carbohydrate servings)  Lunch 1 whole wheat bun(1 carbohydrate servings)  1 black bean burger (1 carbohydrate serving)  2 slices tomatoes  2 leaves lettuce  1 teaspoon mustard  1 small pear (1 carbohydrate servings)  1 cup green tea, unsweetened  Afternoon Snack 1/3 cup trail mix with nuts, seeds, and raisins, without salt (1 carbohydrate servings)  Evening Meal  cup meatless chicken  2/3 cup brown rice, cooked (2 carbohydrate servings)  1 cup broccoli, cooked (2/3 carbohydrate serving)   cup carrots, cooked (1/3 carbohydrate serving)  2 teaspoons olive oil  1 teaspoon balsamic vinegar  1 whole wheat dinner roll (1 carbohydrate serving)  1 teaspoon margarine, soft, tub  1 cup soymilk fortified with calcium, vitamin B12, and vitamin D  Evening Snack 1 extra small banana (1 carbohydrate serving)  1 tablespoon peanut butter    Heart Healthy Consistent Carbohydrate Sample 1-Day Menu  Breakfast 1 cup cooked oatmeal (2 carbohydrate servings)  3/4 cup blueberries (1 carbohydrate serving)  1 ounce almonds  1 cup skim milk (1 carbohydrate serving)  1 cup coffee  Morning Snack 1 cup sugar-free nonfat yogurt (1 carbohydrate serving)  Lunch 2 slices whole-wheat bread (2 carbohydrate servings)  2 ounces lean [place] breast  1 ounce low-fat Swiss cheese  1 teaspoon mustard  1 slice tomato  1 lettuce leaf  1 small pear (1 carbohydrate serving)  1  cup skim milk (1 carbohydrate serving)  Afternoon Snack 1 ounce trail mix with unsalted nuts, seeds, and raisins (1 carbohydrate serving)  Evening Meal 3 ounces salmon  2/3 cup cooked brown rice (2 carbohydrate servings)  1 teaspoon soft margarine  1 cup cooked broccoli with 1/2 cup cooked carrots (1 carbohydrate serving  Carrots, cooked, boiled, drained, without salt  1 cup lettuce  1 teaspoon olive oil with vinegar for dressing  1 small whole grain roll (1 carbohydrate serving)  1 teaspoon soft margarine  1 cup unsweetened tea  °Evening Snack 1 extra-small banana (1 carbohydrate serving)  °Copyright 2020 © [employer]. All rights reserved. °

## 2019-03-12 NOTE — Progress Notes (Signed)
  Echocardiogram 2D Echocardiogram with Definity has been performed. Definity did not appear on screen after initial dose. No further dose given. Nurse notified.  Cameron [name] 03/12/2019, 2:51 PM

## 2019-03-12 NOTE — Telephone Encounter (Signed)
FYI

## 2019-03-12 NOTE — TOC Initial Note (Signed)
Transition of Care [facility]) - Initial/Assessment Note    Patient Details  Name: Cameron [name] MRN: [id number] Date of Birth: 09/08/1941  Transition of Care [facility]) CM/SW Contact:    Sherrilyn Rist  Phone Number: [phone] 03/12/2019, 9:58 AM  Clinical Narrative:                 Patient lives at home with spouse; PCP: Billie Ruddy, MD; has private insurance with [facility] with prescription drug coverage; CM following for progression of care, no needs identified at this time.  Expected Discharge Plan: Home/Self Care Barriers to Discharge: No Barriers Identified   Patient Goals and CMS Choice Patient states their goals for this hospitalization and ongoing recovery are:: to go home CMS Medicare.gov Compare Post Acute Care list provided to:: Patient Choice offered to / list presented to : NA  Expected Discharge Plan and Services Expected Discharge Plan: Home/Self Care In-house Referral: NA Discharge Planning Services: CM Consult Post Acute Care Choice: NA Living arrangements for the past 2 months: Single Family Home                 DME Arranged: N/A DME Agency: NA       HH Arranged: NA HH Agency: NA        Prior Living Arrangements/Services Living arrangements for the past 2 months: Single Family Home Lives with:: Spouse                   Activities of Daily Living Home Assistive Devices/Equipment: CBG Meter ADL Screening (condition at time of admission) Patient's cognitive ability adequate to safely complete daily activities?: Yes Is the patient deaf or have difficulty hearing?: No Does the patient have difficulty seeing, even when wearing glasses/contacts?: No Does the patient have difficulty concentrating, remembering, or making decisions?: No Patient able to express need for assistance with ADLs?: Yes Does the patient have difficulty dressing or bathing?: Yes Independently performs ADLs?: Yes (appropriate for developmental  age) Does the patient have difficulty walking or climbing stairs?: Yes Weakness of Legs: Both Weakness of Arms/Hands: Both  Permission Sought/Granted                  Emotional Assessment         Alcohol / Substance Use: Not Applicable Psych Involvement: No (comment)  Admission diagnosis:  Chest pain [R07.9] Acute on chronic congestive heart failure, unspecified heart failure type ([place]) [I50.9] Acute on chronic diastolic (congestive) heart failure ([place]) [I50.33] Patient Active Problem List   Diagnosis Date Noted  . Acute on chronic diastolic (congestive) heart failure ([place]) 03/11/2019  . Acute on chronic respiratory failure with hypoxia ([place]) 03/11/2019  . HLD (hyperlipidemia) 03/11/2019  . Type II diabetes mellitus with renal manifestations ([place]) 03/11/2019  . HTN (hypertension) 03/11/2019  . Urinary frequency 02/25/2019  . Pulmonary hypertension ([place]) 02/25/2019  . Depression 12/17/2018  . Acute respiratory failure ([place]) 02/16/2018  . HCAP (healthcare-associated pneumonia) 02/16/2018  . Sepsis ([place]) 02/16/2018  . Status post percutaneous transluminal angioplasty (PTA) 01/28/2018  . Severe claudication ([place]) 01/27/2018  . Abnormal ankle brachial index (ABI) 01/27/2018  . Diabetes ([place]) 01/25/2018  . Chronic diastolic heart failure ([place]) 12/03/2017  . Severe aortic stenosis 12/03/2017  . CKD (chronic kidney disease) stage 4, GFR 15-29 ml/min (HCC) 12/03/2017  . 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 Obesity   . COPD (chronic obstructive pulmonary disease) ([place])   . CKD (chronic kidney disease)   .  CAD (coronary artery disease)   . PAF (paroxysmal atrial fibrillation) ([place])   . RBBB    PCP:  Billie Ruddy, MD Pharmacy:   [facility] [address], [place] - [address] [place] [address] Phone: [phone] Fax: [phone]  [id number], [place] [place] [address] Phone: [phone] Fax: [phone]     Social Determinants of Health (SDOH) Interventions    Readmission Risk Interventions No flowsheet data found.

## 2019-03-12 NOTE — Consult Note (Signed)
WOC consulted for concerns with LE circulation and possible Unna's boots. I have requested images and verification of this order with primary care MD.   Will follow up with chart review of images if obtained and will bedside report from the nurse.  [place], [place], [place]

## 2019-03-13 DIAGNOSIS — I890 Lymphedema, not elsewhere classified: Secondary | ICD-10-CM

## 2019-03-13 DIAGNOSIS — L8990 Pressure ulcer of unspecified site, unspecified stage: Secondary | ICD-10-CM

## 2019-03-13 DIAGNOSIS — I25118 Atherosclerotic heart disease of native coronary artery with other forms of angina pectoris: Secondary | ICD-10-CM

## 2019-03-13 DIAGNOSIS — I2781 Cor pulmonale (chronic): Secondary | ICD-10-CM

## 2019-03-13 DIAGNOSIS — J441 Chronic obstructive pulmonary disease with (acute) exacerbation: Secondary | ICD-10-CM

## 2019-03-13 DIAGNOSIS — I272 Pulmonary hypertension, unspecified: Secondary | ICD-10-CM

## 2019-03-13 DIAGNOSIS — J9621 Acute and chronic respiratory failure with hypoxia: Secondary | ICD-10-CM

## 2019-03-13 DIAGNOSIS — N184 Chronic kidney disease, stage 4 (severe): Secondary | ICD-10-CM

## 2019-03-13 DIAGNOSIS — I5033 Acute on chronic diastolic (congestive) heart failure: Secondary | ICD-10-CM

## 2019-03-13 LAB — GLUCOSE, CAPILLARY
Glucose-Capillary: 178 mg/dL — ABNORMAL HIGH (ref 70–99)
Glucose-Capillary: 342 mg/dL — ABNORMAL HIGH (ref 70–99)
Glucose-Capillary: 323 mg/dL — ABNORMAL HIGH (ref 70–99)
Glucose-Capillary: 256 mg/dL — ABNORMAL HIGH (ref 70–99)

## 2019-03-13 LAB — BASIC METABOLIC PANEL
Anion gap: 8 (ref 5–15)
BUN: 59 mg/dL — ABNORMAL HIGH (ref 8–23)
CO2: 30 mmol/L (ref 22–32)
Calcium: 7.8 mg/dL — ABNORMAL LOW (ref 8.9–10.3)
Chloride: 101 mmol/L (ref 98–111)
Creatinine, Ser: 3.7 mg/dL — ABNORMAL HIGH (ref 0.61–1.24)
GFR calc Af Amer: 17 mL/min — ABNORMAL LOW (ref >60)
GFR calc non Af Amer: 15 mL/min — ABNORMAL LOW (ref >60)
Glucose, Bld: 189 mg/dL — ABNORMAL HIGH (ref 70–99)
Potassium: 5.2 mmol/L — ABNORMAL HIGH (ref 3.5–5.1)
Sodium: 139 mmol/L (ref 135–145)

## 2019-03-13 LAB — HEMOGLOBIN A1C
Hgb A1c MFr Bld: 8.6 % — ABNORMAL HIGH (ref 4.8–5.6)
Mean Plasma Glucose: 200.12 mg/dL

## 2019-03-13 LAB — MAGNESIUM: Magnesium: 2.6 mg/dL — ABNORMAL HIGH (ref 1.7–2.4)

## 2019-03-13 MED ORDER — ENSURE MAX PROTEIN PO LIQD
11.0000 [oz_av] | Freq: Every day | ORAL | Status: DC
  Administered 2019-03-13 – 2019-03-17 (×5): 11 [oz_av] via ORAL
  Filled 2019-03-13 (×5): qty 330

## 2019-03-13 MED ORDER — ADULT MULTIVITAMIN W/MINERALS CH
1.0000 | ORAL_TABLET | Freq: Every day | ORAL | Status: DC
  Administered 2019-03-13 – 2019-03-17 (×5): 1 via ORAL
  Filled 2019-03-13 (×5): qty 1

## 2019-03-13 MED ORDER — FUROSEMIDE 10 MG/ML IJ SOLN: 40.0000 mg | Freq: Two times a day (BID) | INTRAMUSCULAR | Status: DC

## 2019-03-13 MED ORDER — SILDENAFIL CITRATE 20 MG PO TABS
20.0000 mg | ORAL_TABLET | Freq: Three times a day (TID) | ORAL | Status: DC
  Administered 2019-03-13 – 2019-03-17 (×13): 20 mg via ORAL
  Filled 2019-03-13 (×13): qty 1

## 2019-03-13 MED ORDER — FUROSEMIDE 10 MG/ML IJ SOLN
80.0000 mg | Freq: Three times a day (TID) | INTRAMUSCULAR | Status: AC
  Administered 2019-03-13 – 2019-03-15 (×6): 80 mg via INTRAVENOUS
  Filled 2019-03-13 (×6): qty 8

## 2019-03-13 MED ORDER — FUROSEMIDE 10 MG/ML IJ SOLN
40.0000 mg | Freq: Every day | INTRAMUSCULAR | Status: DC
  Administered 2019-03-13: 09:00:00 40 mg via INTRAVENOUS
  Filled 2019-03-13: qty 4

## 2019-03-13 MED ORDER — OXYMETAZOLINE HCL 0.05 % NA SOLN
2.0000 | Freq: Two times a day (BID) | NASAL | Status: DC | PRN
  Administered 2019-03-13: 2 via NASAL
  Filled 2019-03-13: qty 30

## 2019-03-13 NOTE — Progress Notes (Signed)
Pt having periods of confusion early this morning. He forgot where he was, easily reoriented. Will continue to monitor.

## 2019-03-13 NOTE — Progress Notes (Signed)
Cameron NOTE  CODEY [name] YCX:[id number] DOB: 26-Sep-1941 DOA: 03/11/2019 PCP: Billie Ruddy, MD   LOS: 1 day   Patient is from: Home  Brief Narrative / Interim history: 78 year old male with history of hypertension, hyperlipidemia, DM-2, COPD not on oxygen, CAD status post CABG, CKD 4, RBBB, diastolic CHF, PAF on Eliquis, aortic stenosis status post TAVR, obesity and venous insufficiency presenting with shortness of breath and admitted for CHF exacerbation.  In ED, hemodynamically stable.  Saturation dropped to 89% and he was put on 3 L by nasal cannula.  BNP 513 (higher than baseline).  EKG A. fib without RVR, LAD and RBBB. Renal function is slightly worse from baseline.  COVID-19 test negative.  Started on IV Lasix and admitted.  Subjective: No major events overnight of this morning.  Reports improvement in his breathing but not quite well.  He denies chest pain.  Endorses some dry cough.  He says he is somehow confused when he woke up this morning but easily reoriented by RN.  Reports making a lot of urine yesterday.  Seen by his cardiologist this morning.  Assessment & Plan: Acute on chronic diastolic CHF/NYHA III-IV: has cardinal symptoms including dyspnea, orthopnea and LE edema.  Echo this admission with EF of 60 to 65%, moderate LVH, severe LAE, moderate RAE and RVSP to 78 mmHg. About 2 L urine output in the last 24 hours.  Some improvement in his breathing.  Slight uptrend in his serum creatinine. -Appreciate cardiology input-increased IV Lasix 20 mg 3 times daily. 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  RVSP 78 mmHg on echocardiogram -Cardiology managing-started Revatio 20 mg 3 times daily.  Chronic COPD: Has dry cough without sputum production.  I do not believe he is in exacerbation. -Continue PRN and scheduled breathing  treatments -Mucolytic's  History of CAD status post CABG: No chest pain.  EKG and troponin reassuring so far. 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  Apparently on extremely high-dose insulin at home.  Became hypoglycemic here without insulin. -Recheck A1c -Continue sliding scale insulin -Continue NPH, will reinitiate basal insulin based on his CBGs -Continue statin -Check lipid panel  Permanent atrial fibrillation: On metoprolol and Eliquis at home -Continue home medications  Hypertension: Normotensive -Continue amlodipine and cardiac meds as above -Amlodipine could contribute to his leg swelling.  Chronic lymphedema -Appreciate wound care input -Could benefit from referral to lymphedema clinic outpatient.  Scheduled Meds: . amLODipine  10 mg Oral Daily  . apixaban  5 mg Oral BID  . aspirin EC  81 mg Oral Daily  . Chlorhexidine Gluconate Cloth  6 each Topical Q0600  . furosemide  40 mg Intravenous Daily  . insulin aspart  0-9 Units Subcutaneous TID WC  . mouth rinse  15 mL Mouth Rinse BID  . metoprolol succinate  100 mg Oral Daily  . mirabegron ER  25 mg Oral Daily  . mupirocin ointment  1 application Nasal BID  . rosuvastatin  40 mg Oral QPC supper  . sodium chloride flush  3 mL Intravenous Q12H   Continuous Infusions: . sodium chloride     PRN Meds:.sodium chloride, acetaminophen, dextromethorphan-guaiFENesin, hydrALAZINE, ipratropium-albuterol, nitroGLYCERIN, ondansetron (ZOFRAN) IV, oxymetazoline, sodium chloride flush  DVT prophylaxis: On Eliquis for A. fib Code Status: DNR Family Communication: Updated patient's wife over the phone on 5/15 Disposition Plan: Remains inpatient for adequate diuresis with IV Lasix.   Consultants:   Cardiology  Procedures:    None  Microbiology: . COVID-19 negative . MRSA PCR positive  Antimicrobials: Anti-infectives (From admission, onward)   None      Objective: Vitals:   03/12/19 1633 03/12/19 1941 03/13/19 0028 03/13/19 0443  BP: 134/84 116/61 (!) 149/66 138/78  Pulse: 73 84 84 73  Resp: 19 19 19 18   Temp: 98.1 F (36.7 C) 98.3 F (36.8 C) (!) 97.1 F (36.2 C) 97.6 F (36.4 C)  TempSrc: Oral Oral Oral Oral  SpO2: 97% 98% 90% 93%  Weight:   108.3 kg   Height:        Intake/Output Summary (Last 24 hours) at 03/13/2019 0720 Last data filed at 03/12/2019 2300 Gross per 24 hour  Intake 1440 ml  Output 1950 ml  Net -510 ml   Filed Weights   03/12/19 0024 03/13/19 0028  Weight: 107.6 kg 108.3 kg    Examination:  GENERAL: No acute distress.  Appears well.  Sitting on bedside chair. HEENT: MMM.  Vision and hearing grossly intact.  NECK: Supple.  No apparent JVD. LUNGS:  No IWOB.  Fair aeration bilaterally.  Fine bibasilar crackles. HEART:  RRR.  S1 and S2 audible. ABD: Bowel sounds present. Soft. Non tender.  MSK/EXT:  Moves all extremities.  Significant venous stasis/insufficiency bilaterally. SKIN: Significant venous stasis/lymphedema with tree bark skin bilaterally NEURO: Awake, alert and oriented appropriately.  No gross deficit.  PSYCH: Calm. Normal affect.  Data Reviewed: I have independently reviewed following labs and imaging studies  CBC: Recent Labs  Lab 03/11/19 1933  WBC 9.8  HGB 10.9*  HCT 36.9*  MCV 86.4  PLT 322   Basic Metabolic Panel: Recent Labs  Lab 03/10/19 1605 03/11/19 1933 03/12/19 0557 03/13/19 0425  NA 143 143 144 139  K 4.9 4.5 4.5 5.2*  CL 103 103 102 101  CO2 34* 30 31 30   GLUCOSE 171* 128* 67* 189*  BUN 52* 52* 53* 59*  CREATININE 3.00* 3.42* 3.58* 3.70*  CALCIUM 8.4 8.3* 8.4* 7.8*  MG  --   --   --  2.6*   GFR: Estimated Creatinine Clearance: 19.9 mL/min (A) (by C-G formula based on SCr of 3.7 mg/dL (H)). Liver Function Tests: No  results for input(s): AST, ALT, ALKPHOS, BILITOT, PROT, ALBUMIN in the last 168 hours. No results for input(s): LIPASE, AMYLASE in the last 168 hours. No results for input(s): AMMONIA in the last 168 hours. Coagulation Profile: No results for input(s): INR, PROTIME in the last 168 hours. Cardiac Enzymes: Recent Labs  Lab 03/11/19 2247  TROPONINI <0.03   BNP (last 3 results) Recent Labs    05/05/18 1535 02/26/19 0958 03/10/19 1605  PROBNP 88.0 399.0* 420.0*   HbA1C: Recent Labs    03/13/19 0425  HGBA1C 8.6*   CBG: Recent Labs  Lab 03/12/19 0655 03/12/19 1128 03/12/19 1557 03/12/19 2109 03/13/19 0614  GLUCAP 77 121* 213* 290* 178*   Lipid Profile: No results for input(s): CHOL, HDL, LDLCALC, TRIG, CHOLHDL, LDLDIRECT in the last 72 hours. Thyroid Function Tests: No results for input(s): TSH, T4TOTAL, FREET4, T3FREE, THYROIDAB in the last 72 hours. Anemia Panel: No results for input(s): VITAMINB12, FOLATE, FERRITIN, TIBC, IRON, RETICCTPCT in the last 72 hours. Urine analysis:    Component Value Date/Time  COLORURINE YELLOW 02/16/2018 1450   APPEARANCEUR HAZY (A) 02/16/2018 1450   LABSPEC 1.015 02/16/2018 1450   PHURINE 5.0 02/16/2018 1450   GLUCOSEU >=500 (A) 02/16/2018 1450   HGBUR LARGE (A) 02/16/2018 1450   BILIRUBINUR n 02/26/2019 1035   KETONESUR NEGATIVE 02/16/2018 1450   PROTEINUR Positive (A) 02/26/2019 1035   PROTEINUR 100 (A) 02/16/2018 1450   UROBILINOGEN 0.2 02/26/2019 1035   UROBILINOGEN 1.0 02/22/2013 1318   NITRITE n 02/26/2019 1035   NITRITE NEGATIVE 02/16/2018 1450   LEUKOCYTESUR Negative 02/26/2019 1035   Sepsis Labs: Invalid input(s): PROCALCITONIN, LACTICIDVEN  Recent Results (from the past 240 hour(s))  SARS Coronavirus 2 (CEPHEID- Performed in [place] hospital lab), Hosp Order     Status: None   Collection Time: 03/11/19  8:54 PM  Result Value Ref Range Status   SARS Coronavirus 2 NEGATIVE NEGATIVE Final    Comment: (NOTE) If  result is NEGATIVE SARS-CoV-2 target nucleic acids are NOT DETECTED. The SARS-CoV-2 RNA is generally detectable in upper and lower  respiratory specimens during the acute phase of infection. The lowest  concentration of SARS-CoV-2 viral copies this assay can detect is 250  copies / mL. A negative result does not preclude SARS-CoV-2 infection  and should not be used as the sole basis for treatment or other  patient management decisions.  A negative result may occur with  improper specimen collection / handling, submission of specimen other  than nasopharyngeal swab, presence of viral mutation(s) within the  areas targeted by this assay, and inadequate number of viral copies  (<250 copies / mL). A negative result must be combined with clinical  observations, patient history, and epidemiological information. If result is POSITIVE SARS-CoV-2 target nucleic acids are DETECTED. The SARS-CoV-2 RNA is generally detectable in upper and lower  respiratory specimens dur ing the acute phase of infection.  Positive  results are indicative of active infection with SARS-CoV-2.  Clinical  correlation with patient history and other diagnostic information is  necessary to determine patient infection status.  Positive results do  not rule out bacterial infection or co-infection with other viruses. If result is PRESUMPTIVE POSTIVE SARS-CoV-2 nucleic acids MAY BE PRESENT.   A presumptive positive result was obtained on the submitted specimen  and confirmed on repeat testing.  While 2019 novel coronavirus  (SARS-CoV-2) nucleic acids may be present in the submitted sample  additional confirmatory testing may be necessary for epidemiological  and / or clinical management purposes  to differentiate between  SARS-CoV-2 and other Sarbecovirus currently known to infect humans.  If clinically indicated additional testing with an alternate test  methodology [phone]) is advised. The SARS-CoV-2 RNA is generally   detectable in upper and lower respiratory sp ecimens during the acute  phase of infection. The expected result is Negative. Fact Sheet for Patients:  StrictlyIdeas.no Fact Sheet for Healthcare Providers: BankingDealers.co.za This test is not yet approved or cleared by the [place] FDA and has been authorized for detection and/or diagnosis of SARS-CoV-2 by FDA under an Emergency Use Authorization (EUA).  This EUA will remain in effect (meaning this test can be used) for the duration of the COVID-19 declaration under Section 564(b)(1) of the Act, 21 U.S.C. section 360bbb-3(b)(1), unless the authorization is terminated or revoked sooner. Performed at [facility], [place] [address] [place], [place] [address]   MRSA PCR Screening     Status: Abnormal   Collection Time: 03/12/19 12:49 AM  Result Value Ref Range Status  MRSA by PCR POSITIVE (A) NEGATIVE Final    Comment:        The GeneXpert MRSA Assay (FDA approved for NASAL specimens only), is one component of a comprehensive MRSA colonization surveillance program. It is not intended to diagnose MRSA infection nor to guide or monitor treatment for MRSA infections. RESULT CALLED TO, READ BACK BY AND VERIFIED WITH: THOMAS,J RN 03/12/2019 AT 0243 SKEEN,P Performed at [facility], [place] [address], [place], [place] [address]       Radiology Studies: No results found.  Jennyfer Nickolson T. [facility] Pager [phone]  If 7PM-7AM, please contact night-coverage [email or web address] Password [email or web address] 03/13/2019, 7:20 AM

## 2019-03-13 NOTE — Progress Notes (Addendum)
Inpatient Diabetes Program Recommendations  AACE/ADA: New Consensus Statement on Inpatient Glycemic Control (2015)  Target Ranges:  Prepandial:   less than 140 mg/dL      Peak postprandial:   less than 180 mg/dL (1-2 hours)      Critically ill patients:  140 - 180 mg/dL   Results for Cameron [name], Cameron [name] (MRN [id number]) as of 03/13/2019 09:38  Ref. Range 03/12/2019 06:07 03/12/2019 06:33 03/12/2019 06:55 03/12/2019 11:28 03/12/2019 15:57 03/12/2019 21:09  Glucose-Capillary Latest Ref Range: 70 - 99 mg/dL 56 (L) 59 (L) 77 121 (H) 213 (H)  3 units NOVOLOG  290 (H)    Results for Cameron [name], Cameron [name] (MRN [id number]) as of 03/13/2019 09:38  Ref. Range 03/13/2019 06:14  Glucose-Capillary Latest Ref Range: 70 - 99 mg/dL 178 (H)  2 units NOVOLOG     Admit: Acute on chronic respiratory failure with hypoxiaand acute on chronic diastolic (congestive) heart failure  History: DM, COPD, CKD4, CHF  Home DM Meds: NPH Insulin 200 units Daily   Current Orders: Novolog Sensitive Correction Scale/ SSI (0-9 units) TID AC     Endocrinologist: Dr. Renato Shin with [facility]--Last seen 11/24/2018.  Was instructed to Stop taking his Regular Short-acting insulin and Increase his NPH Insulin to 200 units Daily.  Prior to this visit, pt was taking Regular Insulin 55 units with Breakfast/ 55 units with Lunch/ 75 units with Dinner.  Was instructed to follow up with Dr. Loanne Drilling in two months after that visit.   HYPO yesterday AM--Per MAR, last dose of NPH Insulin that pt took was 05/13 in the AM at home prior to admission.    MD- Note that NPH was discontinued yesterday.  Pt had a couple of elevated CBGs late yesterday afternoon/evening.  CBG this AM: 178 mg/dl  Please consider the following:   1. Start low dose basal insulin: Lantus 10 units QHS (0.1 units/kg dosing based on weight of 108 kg)   2. Start low dose Novolog Meal Coverage: Novolog 3 units TID with meals   (Please add the  following Hold Parameters: Hold if pt eats <50% of meal, Hold if pt NPO)   Addendum 2pm- Met with pt today to discuss home DM regimen.  Pt told me he was told to stop taking the Regular insulin by Dr. Loanne Drilling back in January and Increase his NPH insulin to 200 units daily.  Takes the NPH in the morning.  States he does not miss doses but unhappy with his CBG control as he stated to me he has low CBG and then very high CBGs.  Asked pt when he is having the extreme CBGs, however, pt stated he has the lows and highs at various times of the day.  Used to see Dr. Chalmers Cater with [facility] for DM management and stated he really liked seeing Dr. Chalmers Cater, however, pt told me he did not like the other staff at that office so he left.  Not sure when he started seeing Dr. Loanne Drilling.  Told me he doesn't check his CBGs like he should.  Seemed very unhappy about his overall health and focused solely on emphasizing that he was unhappy that he got a "cow valve" several years ago and that it really didn't seem to help (I assume that he was referring to his valve replacement surgery in Feb of 2019).  Pt told me he has had several heart attacks over the last several years and feels his health is overall poor and doesn't feel  he can do anything about it.  Encouraged pt to follow up with Dr. Loanne Drilling and PCP after d/c.  Encouraged pt to check his CBG tid ac at home and to record all CBGs for Dr. Loanne Drilling to review.  Discussed with pt that it is important he eat 3 meals per day given he takes such a large dose of one insulin at home daily.   --Will follow patient during hospitalization--  Wyn Quaker RN, MSN, CDE Diabetes Coordinator Inpatient Glycemic Control Team Team Pager: [phone] (8a-5p)

## 2019-03-13 NOTE — Progress Notes (Signed)
Initial Nutrition Assessment  RD working remotely.  DOCUMENTATION CODES:   Obesity unspecified  INTERVENTION:   -Ensure Max po daily, each supplement provides 150 kcal and 30 grams of protein.  -MVI with minerals daily  NUTRITION DIAGNOSIS:   Increased nutrient needs related to chronic illness(COPD, CHF) as evidenced by estimated needs.  GOAL:   Patient will meet greater than or equal to 90% of their needs  MONITOR:   PO intake, Supplement acceptance, Labs, Weight trends, Skin, I & O's  REASON FOR ASSESSMENT:   Other (Comment)    ASSESSMENT:   Cameron [name] is a 78 y.o. male with medical history significant of hypertension, hyperlipidemia, diabetes mellitus, COPD, CAD, CABG, CKD 4, right bundle blockade, s/p TAVR, obesity, PAF on Eliquis, dCHF, who presents with shortness of breath.  Pt admitted with acute on chronic respiratory failure secondary to CHF and COPD exacerbation.   Per [facility] notes, BLE wounds consistent with hypertrophic skin changes from venous stasis/lymphadema. Pt also with weeping from fissured areas. Pt reports he puts cream on his leg daily and is followed by [facility] for his leg sores.   Spoke with pt on phone, who was pleasant and in good spirits today. Pt shares that his appetite is good and consumed most of his breakfast (1/3 potatoes, a blueberry muffin, coffee, and juice). Noted meal completion 75-90%.  PTA pt reports consuming 3 meals per day (Breakfast: chipped beef and toast OR bagel and cream cheese OR fried egg; Lunch: meat and potatoes; Dinner: boiled ham sandwich). Pt also reports snacking on cookies and potato chips. Beverages include mainly diet sodas, however, pt reports he has been trying to reduce his carbonated beverage intake.   Lab Results  Component Value Date   HGBA1C 8.6 (H) 03/13/2019   PTA DM medications are 200 units insulin NPH Human daily. Discussed home DM management with pt, who reports "it goes up and down-  yesterday it was in the 50's and then all the way up to the 300's". Pt monitors his CBGS and takes insulin as prescribed; denies any difficulty obtaining medications and supplies. He is followed by [facility] ("not sure who, but it starts with an N", however, this RD is unaware of any endocrinologist in the health system based upon that description"). He reports he has not seen many changes since being followed by endocrinology "but I still go there".   Pt does not monitor his weight. He denies any weight changes. Noted wt gain, likely related to edema. Pt expressed frustration with weight monitoring due to scale accuracy ("the number is always different from my scale and the doctor's scale- the difference is I have a $9 scale and they have a $9,000 scale"). RD discussed importance of daily weight monitoring to help manage CHF and readmissions. Discussed how different scales produce different readings, but to still use home scale to assess weight patterns.   Discussed with pt importance of good meal intake to promote healing. Also reviewed heart heathy/ carb modified diet with pt, however, expect fair to poor compliance. Pt appreciative of call and denies any further questions at this time.  Labs reviewed: K: 5.2, Mg: 2.6, CBGS: 213-290 (inpatient orders for glycemic control are 0-9 units insulin aspart TID with meals).   NUTRITION - FOCUSED PHYSICAL EXAM:    Most Recent Value  Orbital Region  Unable to assess  Upper Arm Region  Unable to assess  Thoracic and Lumbar Region  Unable to assess  Buccal Region  Unable to assess  Temple Region  Unable to assess  Clavicle Bone Region  Unable to assess  Clavicle and Acromion Bone Region  Unable to assess  Scapular Bone Region  Unable to assess  Dorsal Hand  Unable to assess  Patellar Region  Unable to assess  Anterior Thigh Region  Unable to assess  Posterior Calf Region  Unable to assess  Edema (RD Assessment)  Unable to assess  Hair   Unable to assess  Eyes  Unable to assess  Mouth  Unable to assess  Skin  Unable to assess  Nails  Unable to assess       Diet Order:   Diet Order            Diet 2 gram sodium Room service appropriate? Yes; Fluid consistency: Thin  Diet effective now              EDUCATION NEEDS:   Education needs have been addressed  Skin:  Skin Assessment: Skin Integrity Issues: Skin Integrity Issues:: Other (Comment) Other: BLE lymphadema/venous statis ulcers with hypertrophic skin changes and weeping from fissured areas consistant with presentation of severe skin changes  Last BM:  03/13/19  Height:   Ht Readings from Last 1 Encounters:  03/12/19 5\' 9"  (1.753 m)    Weight:   Wt Readings from Last 1 Encounters:  03/13/19 108.3 kg    Ideal Body Weight:  72.7 kg  BMI:  Body mass index is 35.25 kg/m.  Estimated Nutritional Needs:   Kcal:  2000-2200  Protein:  90-105 grams  Fluid:  2.0-2.2 L    G[place]on Warzecha A. Jimmye Norman, RD, LDN, [place] Registered Dietitian II Certified Diabetes Care and Education Specialist Pager: [phone] After hours Pager: [phone]

## 2019-03-13 NOTE — Consult Note (Signed)
WOC by to assess LEs, foam managing weeping. May need long term management of LE edema, however it is noted his ABIs from March 2019 indicated RLE 0.66 and the LLE 0.97.  Would consider him higher risk for compression therefore would recommend he be evaluated in a wound care clinic or vascular clinic for long term management of his venous and arterial disease.   No other topical care needed, he self reports he is followed by a dermatologist for his skin issues on the LEs.   [place], [place], [place]

## 2019-03-13 NOTE — Consult Note (Addendum)
CARDIOLOGY CONSULT NOTE  Patient ID: Cameron [name] MRN: [id number] DOB/AGE: 1941-09-22 78 y.o.  Admit date: 03/11/2019 Referring Physician  Wendee Beavers, MD Primary Physician:  Billie Ruddy, MD Reason for Consultation  CHF  HPI: Cameron [name]  is a 78 y.o. male  with History of TAVR with 26 mm Edwards APN well on zero 2/5 2019, CAD S/P CABG, moderate pulmonary attention, PAD with angioplasty to right SFA in March 2019, paroxysmal atrial fibrillation, hypertension, diabetes mellitus, stage IV chronic kidney disease and COPD admitted to the hospital with worsening dyspnea and hypoxemia, I was consulted by primary team to evaluate congestive heart failure and manage atrial fibrillation.  This morning patient feels better, states that his dyspnea is much improved.  He has not had any chest pain, he does have a dry cough.  No fever or chills.  Past Medical History:  Diagnosis Date  . CAD (coronary artery disease)    a. 2002: s/p CABG x 4V (LIMA-LAD, seq SVG-PDA, PLA, seq SVG-OM1, OM2, SVG-Diag1)  . CKD (chronic kidney disease)   . COPD (chronic obstructive pulmonary disease) ([place])   . Diabetes mellitus without complication ([place])   . HLD (hyperlipidemia)   . Hypertension   . Obesity   . PAF (paroxysmal atrial fibrillation) ([place])   . RBBB   . 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 Severe aortic stenosis     Past Surgical History:  Procedure Laterality Date  . ABDOMINAL AORTAGRAM Right 01/28/2018   LOWER EXTREMITY  . ABDOMINAL AORTOGRAM W/LOWER EXTREMITY N/A 01/28/2018   Procedure: ABDOMINAL AORTOGRAM W/LOWER EXTREMITY [place];  Surgeon: Nigel Mormon, MD;  Location: [place] [facility];  Service: Cardiovascular;  Laterality: N/A;  . CARDIAC SURGERY    . CORONARY BALLOON ANGIOPLASTY N/A 09/23/2017   Procedure: CORONARY BALLOON ANGIOPLASTY;  Surgeon: Nigel Mormon, MD;   Location: [place] [facility];  Service: Cardiovascular;  Laterality: N/A;  . FINGER AMPUTATION    . HERNIA REPAIR    . open heart surgery    . PERIPHERAL VASCULAR BALLOON ANGIOPLASTY  01/28/2018   Procedure: PERIPHERAL VASCULAR BALLOON ANGIOPLASTY;  Surgeon: Nigel Mormon, MD;  Location: [place] [facility];  Service: Cardiovascular;;  Rt SFA  . RIGHT HEART CATH AND CORONARY/GRAFT ANGIOGRAPHY N/A 09/17/2017   Procedure: RIGHT HEART CATH AND CORONARY/GRAFT ANGIOGRAPHY;  Surgeon: Nigel Mormon, MD;  Location: [place] [facility];  Service: Cardiovascular;  Laterality: N/A;  . TEE WITHOUT CARDIOVERSION N/A 12/03/2017   Procedure: TRANSESOPHAGEAL ECHOCARDIOGRAM (TEE);  Surgeon: Sherren Mocha, MD;  Location: [place];  Service: Open Heart Surgery;  Laterality: N/A;  . TOE AMPUTATION    . TRANSCATHETER AORTIC VALVE REPLACEMENT, TRANSFEMORAL N/A 12/03/2017   Procedure: TRANSCATHETER AORTIC VALVE REPLACEMENT, TRANSFEMORAL;  Surgeon: Sherren Mocha, MD;  Location: [place];  Service: Open Heart Surgery;  Laterality: N/A;  . ULTRASOUND GUIDANCE FOR VASCULAR ACCESS  09/17/2017   Procedure: Ultrasound Guidance For Vascular Access;  Surgeon: Nigel Mormon, MD;  Location: [place] [facility];  Service: Cardiovascular;;    Social History   Socioeconomic History  . Marital status: Married    Spouse name: Not on file  . Number of children: 2  . Years of education: Not on file  . Highest education level: Not on file  Occupational History  . Occupation: Doctor, general practice  Social Needs  . Financial resource strain: Not on  file  . Food insecurity:    Worry: Not on file    Inability: Not on file  . Transportation needs:    Medical: Not on file    Non-medical: Not on file  Tobacco Use  . Smoking status: Former Smoker    Packs/day: 2.00    Years: 20.00    Pack years: 40.00    Types: Cigarettes    Last attempt to quit: 09/28/1978    Years since quitting: 40.4  . Smokeless tobacco:  Never Used  Substance and Sexual Activity  . Alcohol use: No  . Drug use: No  . Sexual activity: Not on file  Lifestyle  . Physical activity:    Days per week: Not on file    Minutes per session: Not on file  . Stress: Not on file  Relationships  . Social connections:    Talks on phone: Not on file    Gets together: Not on file    Attends religious service: Not on file    Active member of club or organization: Not on file    Attends meetings of clubs or organizations: Not on file    Relationship status: Not on file  . Intimate partner violence:    Fear of current or ex partner: Not on file    Emotionally abused: Not on file    Physically abused: Not on file    Forced sexual activity: Not on file  Other Topics Concern  . Not on file  Social History Narrative  . Not on file    No current facility-administered medications on file prior to encounter.    Current Outpatient Medications on File Prior to Encounter  Medication Sig Dispense Refill  . amLODipine (NORVASC) 10 MG tablet Take 10 mg by mouth daily.    Marland Kitchen apixaban (ELIQUIS) 5 MG TABS tablet Take 1 tablet (5 mg total) by mouth 2 (two) times daily. 180 tablet 2  . aspirin EC 81 MG EC tablet Take 1 tablet (81 mg total) by mouth daily.    . insulin NPH Human (NOVOLIN N RELION) 100 UNIT/ML injection Inject 2 mLs (200 Units total) into the skin every morning. And syringes 2/day (Patient taking differently: Inject 200 Units into the skin daily before breakfast. ) 70 mL 11  . metoprolol succinate (TOPROL-XL) 100 MG 24 hr tablet Take 100 mg by mouth daily.    . nitroGLYCERIN (NITROSTAT) 0.4 MG SL tablet Place 0.4 mg every 5 (five) minutes as needed under the tongue for chest pain.    . rosuvastatin (CRESTOR) 40 MG tablet Take 40 mg daily after supper by mouth.     . torsemide (DEMADEX) 20 MG tablet Take two tablets once daily (Patient taking differently: Take 20 mg by mouth See admin instructions. Take 20 mg by mouth at 7 AM and 20 mg  at 10 AM) 60 tablet 1  . isosorbide mononitrate (IMDUR) 30 MG 24 hr tablet Take 1 tablet (30 mg total) by mouth daily. (Patient not taking: Reported on 03/11/2019) 30 tablet 11  . mirabegron ER (MYRBETRIQ) 25 MG TB24 tablet Take 1 tablet (25 mg total) by mouth daily. 30 tablet 5    Review of Systems  Constitution: Positive for malaise/fatigue. Negative for chills, decreased appetite and weight gain.  Cardiovascular: Negative for dyspnea on exertion, leg swelling, palpitations and syncope.  Respiratory: Positive for shortness of breath and wheezing. Negative for sputum production.   Endocrine: Negative for cold intolerance.  Hematologic/Lymphatic: Does not bruise/bleed easily.  Skin: Positive for rash.  Musculoskeletal: Negative for joint swelling.  Gastrointestinal: Negative for abdominal pain, anorexia, change in bowel habit, hematochezia and melena.  Genitourinary: Positive for frequency. Negative for hematuria.  Neurological: Negative for headaches and light-headedness.  Psychiatric/Behavioral: Negative for depression and substance abuse.  All other systems reviewed and are negative.      Objective:  Blood pressure 138/78, pulse 73, temperature 97.6 F (36.4 C), temperature source Oral, resp. rate 18, height 5\' 9"  (1.753 m), weight 108.3 kg, SpO2 93 %. Body mass index is 35.25 kg/m.  Physical Exam  Constitutional: He appears well-developed and well-nourished. No distress.  HENT:  Head: Atraumatic.  Eyes: Conjunctivae are normal.  Neck: Neck supple. No JVD present. No thyromegaly present.  Cardiovascular: Intact distal pulses. An irregularly irregular rhythm present. Exam reveals no gallop, no S3 and no S4.  No murmur heard. Pulses:      Carotid pulses are 2+ on the right side and 2+ on the left side.      Dorsalis pedis pulses are 0 on the right side and 0 on the left side.       Posterior tibial pulses are 0 on the right side and 0 on the left side.  S1 is variable, S2 is  normal.  No murmur appreciated, distant heart sounds. Bilateral lower extremity 2-3+ edema, partially pitting, chronic venous stasis dermatitis noted.  Pulmonary/Chest: Effort normal and breath sounds normal.  Abdominal: Soft. Bowel sounds are normal.  Musculoskeletal: Normal range of motion.        General: No edema.  Neurological: He is alert.  Skin: Skin is warm and dry.  Psychiatric: He has a normal mood and affect.    Radiology: Dg Chest Port 1 View  Result Date: 03/11/2019 CLINICAL DATA:  Shortness of breath EXAM: PORTABLE CHEST 1 VIEW COMPARISON:  09/21/2018 FINDINGS: Mild cardiomegaly. Remote median sternotomy. No pulmonary edema or focal airspace consolidation. Bibasilar atelectasis. No pleural effusion or pneumothorax. IMPRESSION: No active disease. Electronically Signed   By: Ulyses Jarred M.D.   On: 03/11/2019 21:23   Laboratory Examination:  CMP Latest Ref Rng & Units 03/13/2019 03/12/2019 03/11/2019  Glucose 70 - 99 mg/dL 189(H) 67(L) 128(H)  BUN 8 - 23 mg/dL 59(H) 53(H) 52(H)  Creatinine 0.61 - 1.24 mg/dL 3.70(H) 3.58(H) 3.42(H)  Sodium 135 - 145 mmol/L 139 144 143  Potassium 3.5 - 5.1 mmol/L 5.2(H) 4.5 4.5  Chloride 98 - 111 mmol/L 101 102 103  CO2 22 - 32 mmol/L 30 31 30   Calcium 8.9 - 10.3 mg/dL 7.8(L) 8.4(L) 8.3(L)  Total Protein 6.0 - 8.3 g/dL - - -  Total Bilirubin 0.2 - 1.2 mg/dL - - -  Alkaline Phos 39 - 117 U/L - - -  AST 0 - 37 U/L - - -  ALT 0 - 53 U/L - - -   CBC Latest Ref Rng & Units 03/11/2019 02/26/2019 09/21/2018  WBC 4.0 - 10.5 K/uL 9.8 9.7 7.1  Hemoglobin 13.0 - 17.0 g/dL 10.9(L) 12.8(L) 13.3  Hematocrit 39.0 - 52.0 % 36.9(L) 40.3 42.9  Platelets 150 - 400 K/uL 160 166.0 137(L)   Lipid Panel     Component Value Date/Time   CHOL 135 09/15/2017 0255   TRIG 126 09/20/2017 2143   HDL 28 (L) 09/15/2017 0255   CHOLHDL 4.8 09/15/2017 0255   VLDL 34 09/15/2017 0255   LDLCALC 73 09/15/2017 0255   HEMOGLOBIN A1C Lab Results  Component Value Date    HGBA1C 8.6 (H)  03/13/2019   MPG 200.12 03/13/2019   TSH No results for input(s): TSH in the last 8760 hours. Cardiac Panel (last 3 results) Recent Labs    03/11/19 2247  TROPONINI <0.03   Scheduled Meds: . amLODipine  10 mg Oral Daily  . apixaban  5 mg Oral BID  . aspirin EC  81 mg Oral Daily  . Chlorhexidine Gluconate Cloth  6 each Topical Q0600  . furosemide  40 mg Intravenous Daily  . insulin aspart  0-9 Units Subcutaneous TID WC  . mouth rinse  15 mL Mouth Rinse BID  . metoprolol succinate  100 mg Oral Daily  . mirabegron ER  25 mg Oral Daily  . mupirocin ointment  1 application Nasal BID  . rosuvastatin  40 mg Oral QPC supper  . sodium chloride flush  3 mL Intravenous Q12H   Continuous Infusions: . sodium chloride     PRN Meds:.sodium chloride, acetaminophen, dextromethorphan-guaiFENesin, hydrALAZINE, ipratropium-albuterol, nitroGLYCERIN, ondansetron (ZOFRAN) IV, oxymetazoline, sodium chloride flush Medications Discontinued During This Encounter  Medication Reason  . metoprolol succinate (TOPROL-XL) 50 MG 24 hr tablet Entry Error  . ipratropium (ATROVENT) nebulizer solution 0.5 mg   . furosemide (LASIX) injection 40 mg   . insulin NPH Human (NOVOLIN N) injection 150 Units   . albuterol (PROVENTIL) (2.5 MG/3ML) 0.083% nebulizer solution 3 mL   . ipratropium (ATROVENT) nebulizer solution 0.5 mg   . furosemide (LASIX) injection 60 mg   . furosemide (LASIX) injection 40 mg    Current Meds  Medication Sig  . amLODipine (NORVASC) 10 MG tablet Take 10 mg by mouth daily.  Marland Kitchen apixaban (ELIQUIS) 5 MG TABS tablet Take 1 tablet (5 mg total) by mouth 2 (two) times daily.  Marland Kitchen aspirin EC 81 MG EC tablet Take 1 tablet (81 mg total) by mouth daily.  . insulin NPH Human (NOVOLIN N RELION) 100 UNIT/ML injection Inject 2 mLs (200 Units total) into the skin every morning. And syringes 2/day (Patient taking differently: Inject 200 Units into the skin daily before breakfast. )  .  metoprolol succinate (TOPROL-XL) 100 MG 24 hr tablet Take 100 mg by mouth daily.  . nitroGLYCERIN (NITROSTAT) 0.4 MG SL tablet Place 0.4 mg every 5 (five) minutes as needed under the tongue for chest pain.  . rosuvastatin (CRESTOR) 40 MG tablet Take 40 mg daily after supper by mouth.   . torsemide (DEMADEX) 20 MG tablet Take two tablets once daily (Patient taking differently: Take 20 mg by mouth See admin instructions. Take 20 mg by mouth at 7 AM and 20 mg at 10 AM)    Cardiac studies:   Echocardiogram 03/12/2019: 1. The left ventricle has normal systolic function with an ejection fraction of 60-65%. The cavity size was normal. There is moderate concentric left ventricular hypertrophy. Left ventricular diastolic function could not be evaluated.  2. The right ventricle has moderately reduced systolic function. The cavity was moderately enlarged. There is no increase in right ventricular wall thickness. Right ventricular systolic pressure could not be assessed with an estimated pressure of 78.7  mmHg.  3. Left atrial size was severely dilated.  4. Right atrial size was moderately dilated.  5. The mitral valve is degenerative. Moderate thickening of the mitral valve leaflet. Moderate calcification of the mitral valve leaflet.  6. The tricuspid valve is grossly normal. Tricuspid valve regurgitation is moderate.  7. No stenosis of the aortic valve.  8. The inferior vena cava was dilated in size with <50% respiratory variability.  Lexiscan myoview stress test 01/24/2018:  1. Pharmacologic stress testing was performed with intravenous administration of .4 mg of Lexiscan over a 10-15 seconds infusion. Peak BP 162/78 mmHg. Stress symptoms included nausea. Exercise capacity not assessed. Stress EKG is non diagnostic for ischemia as it is a pharmacologic stress.  2. The overall quality of the study is good. There is no evidence of abnormal lung activity. Stress and rest SPECT images demonstrate  homogeneous tracer distribution throughout the myocardium, with physiological apical thinning. Gated SPECT imaging reveals normal myocardial thickening and wall motion. The left system ejection fraction was normal (53%).  3. Low risk study.  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 Left ICA 40-59% stenosis. Both the lower end of scale. Both vertebral arteries patent with antegrade flow.  ABI 02/05/2018: This exam reveals normal perfusion of the right lower extremity with mildly abnormal (biphasic) waveform (ABI 0.97) and mildly decreased perfusion of the left lower extremity, with mildly abnormal (biphasic) waveform noted at the post tibial artery level (ABI).  Compared to 01/15/2018, Rt ABI 0.66 Lt ABI 0.77, study suggests successful revascularization.  Assessment:   1.  Shortness of breath of multifactorial etiology including acute on chronic diastolic heart failure, underlying COPD, moderate obesity. 2.  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  Findings are consistent with chronic cor pulmonale. 3.  Coronary artery disease, S/P CABG, low risk stress test a year ago. 4. Stage IV chronic kidney disease, on the verge of stage V. 5.  PAF CHA2DS2-VASc Score is 6.  -(CHF; HTN; vasc disease DM,  Male = 1; Age <65 =0; 65-74 = 1,  >75 =2; stroke = 2).  -(Yearly risk of stroke: Score of 1=1.3; 2=2.2; 3=3.2; 4=4; 5=6.7; 6=9.8; 7=>9.8)   Plan:  Patient is extremely well with multiple medical comorbidity, his dyspnea is of multifactorial etiology.  Will increase the dose of furosemide to 80 mg 3 times daily, otherwise continue present medical therapy.  With regard to pulmonary hypertension, he may benefit from being on a PDE 5 inhibitor, will discontinue nitrates.  We will have to evaluate him for home oxygen needs.  Extremely guarded prognosis. With regard to atrial fibrillation with  RVR, elevated heart rate is also related to underlying COPD and hypoxemia.  Please evaluate for home oxygen needs.  I probably would suspect he will need admission and evaluation over the weekend to get and mobilize fluid off.  Cameron Prows, MD, [facility] 03/13/2019, 8:44 AM [facility]. [place] Pager: [phone] Office: [phone] If no answer Cell [phone]

## 2019-03-13 NOTE — Evaluation (Signed)
Physical Therapy Evaluation Patient Details Name: Cameron [name] MRN: [id number] DOB: [birth year] Today's Date: 03/13/2019   History of Present Illness  Pt is a 78 y.o. male admitted 03/11/19 with worsening dyspnea. Worked up for CHF exacerbation and severe pulmonary hypertension. 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    Clinical Impression  Pt presents with an overall decrease in functional mobility secondary to above. PTA, pt indep and lives with wife. Today, pt ambulatory at supervision-level; limited by decreased activity tolerance and fatigue. SpO2 down to 83% on RA; returned to >90% with seated rest on 2L O2 [place]. Educ re: activity recommendations with HF, energy conservation strategies. Pt would benefit from continued acute PT services to maximize functional mobility and independence prior to d/c home.     Follow Up Recommendations No PT follow up;Supervision for mobility/OOB    Equipment Recommendations  None recommended by PT    Recommendations for Other Services       Precautions / Restrictions Precautions Precautions: Fall Precaution Comments: Watch SpO2 Restrictions Weight Bearing Restrictions: No      Mobility  Bed Mobility               General bed mobility comments: Received sitting in recliner  Transfers Overall transfer level: Independent Equipment used: None                Ambulation/Gait Ambulation/Gait assistance: Supervision Gait Distance (Feet): 230 Feet Assistive device: None Gait Pattern/deviations: Step-through pattern;Decreased stride length;Drifts right/left Gait velocity: Decreased   General Gait Details: Slow, mostly steady gait without DME; intermittent bouts of self-correct instability. SpO2 down to 83% on RA; returning to >90% with seated rest on 2L O2 [name]  Stairs Stairs: (Pt declined)          Wheelchair Mobility    Modified Rankin (Stroke Patients Only)       Balance  Overall balance assessment: Needs assistance   Sitting balance-Leahy Scale: Good       Standing balance-Leahy Scale: Good                               Pertinent Vitals/Pain Pain Assessment: Faces Faces Pain Scale: Hurts a little bit Pain Location: BLEs Pain Descriptors / Indicators: Sore Pain Intervention(s): Limited activity within patient's tolerance    Home Living Family/patient expects to be discharged to:: Private residence Living Arrangements: Spouse/significant other Available Help at Discharge: Family;Available 24 hours/day Type of Home: House Home Access: Stairs to enter Entrance Stairs-Rails: Psychiatric nurse of Steps: 5 Home Layout: One level Home Equipment: None      Prior Function Level of Independence: Independent         Comments: Drives, able to perform household tasks but limited by DOE. Not on home O2     Hand Dominance        Extremity/Trunk Assessment   Upper Extremity Assessment Upper Extremity Assessment: Overall WFL for tasks assessed    Lower Extremity Assessment Lower Extremity Assessment: Overall WFL for tasks assessed       Communication   Communication: No difficulties  Cognition Arousal/Alertness: Awake/alert Behavior During Therapy: WFL for tasks assessed/performed Overall Cognitive Status: Within Functional Limits for tasks assessed  General Comments General comments (skin integrity, edema, etc.): Energy conservation handout provided and discussed. Also discussed potential for needing home O2    Exercises     Assessment/Plan    PT Assessment Patient needs continued PT services  PT Problem List Decreased activity tolerance;Decreased balance;Decreased mobility       PT Treatment Interventions DME instruction;Gait training;Stair training;Functional mobility training;Therapeutic exercise;Balance training;Therapeutic  activities;Patient/family education    PT Goals (Current goals can be found in the Care Plan section)  Acute Rehab PT Goals Patient Stated Goal: Does not want to need home O2 PT Goal Formulation: With patient Time For Goal Achievement: 03/27/19 Potential to Achieve Goals: Fair    Frequency Min 3X/week   Barriers to discharge        Co-evaluation               AM-PAC PT "6 Clicks" Mobility  Outcome Measure Help needed turning from your back to your side while in a flat bed without using bedrails?: None Help needed moving from lying on your back to sitting on the side of a flat bed without using bedrails?: None Help needed moving to and from a bed to a chair (including a wheelchair)?: None Help needed standing up from a chair using your arms (e.g., wheelchair or bedside chair)?: None Help needed to walk in hospital room?: A Little Help needed climbing 3-5 steps with a railing? : A Little 6 Click Score: 22    End of Session   Activity Tolerance: Patient tolerated treatment well Patient left: in chair;with call bell/phone within reach Nurse Communication: Mobility status PT Visit Diagnosis: Other abnormalities of gait and mobility (R26.89)    Time: 6468-0321 PT Time Calculation (min) (ACUTE ONLY): 19 min   Charges:   PT Evaluation $PT Eval Moderate Complexity: [place], PT, DPT Acute Rehabilitation Services  Pager [phone] Office [place] 03/13/2019, 4:33 PM

## 2019-03-14 DIAGNOSIS — L89309 Pressure ulcer of unspecified buttock, unspecified stage: Secondary | ICD-10-CM

## 2019-03-14 DIAGNOSIS — I2729 Other secondary pulmonary hypertension: Secondary | ICD-10-CM

## 2019-03-14 DIAGNOSIS — I480 Paroxysmal atrial fibrillation: Secondary | ICD-10-CM

## 2019-03-14 LAB — BASIC METABOLIC PANEL
Anion gap: 12 (ref 5–15)
BUN: 64 mg/dL — ABNORMAL HIGH (ref 8–23)
CO2: 29 mmol/L (ref 22–32)
Calcium: 8 mg/dL — ABNORMAL LOW (ref 8.9–10.3)
Chloride: 97 mmol/L — ABNORMAL LOW (ref 98–111)
Creatinine, Ser: 3.59 mg/dL — ABNORMAL HIGH (ref 0.61–1.24)
GFR calc Af Amer: 18 mL/min — ABNORMAL LOW (ref >60)
GFR calc non Af Amer: 15 mL/min — ABNORMAL LOW (ref >60)
Glucose, Bld: 270 mg/dL — ABNORMAL HIGH (ref 70–99)
Potassium: 4.2 mmol/L (ref 3.5–5.1)
Sodium: 138 mmol/L (ref 135–145)

## 2019-03-14 LAB — MAGNESIUM: Magnesium: 2.3 mg/dL (ref 1.7–2.4)

## 2019-03-14 LAB — GLUCOSE, CAPILLARY
Glucose-Capillary: 265 mg/dL — ABNORMAL HIGH (ref 70–99)
Glucose-Capillary: 249 mg/dL — ABNORMAL HIGH (ref 70–99)
Glucose-Capillary: 386 mg/dL — ABNORMAL HIGH (ref 70–99)
Glucose-Capillary: 277 mg/dL — ABNORMAL HIGH (ref 70–99)

## 2019-03-14 LAB — BRAIN NATRIURETIC PEPTIDE: B Natriuretic Peptide: 394.4 pg/mL — ABNORMAL HIGH (ref 0.0–100.0)

## 2019-03-14 MED ORDER — INSULIN ASPART 100 UNIT/ML ~~LOC~~ SOLN
0.0000 [IU] | Freq: Three times a day (TID) | SUBCUTANEOUS | Status: DC
  Administered 2019-03-14: 8 [IU] via SUBCUTANEOUS
  Administered 2019-03-15: 3 [IU] via SUBCUTANEOUS
  Administered 2019-03-15: 11 [IU] via SUBCUTANEOUS
  Administered 2019-03-15: 15 [IU] via SUBCUTANEOUS
  Administered 2019-03-16: 3 [IU] via SUBCUTANEOUS
  Administered 2019-03-16: 5 [IU] via SUBCUTANEOUS
  Administered 2019-03-16: 8 [IU] via SUBCUTANEOUS
  Administered 2019-03-17: 5 [IU] via SUBCUTANEOUS
  Administered 2019-03-17: 2 [IU] via SUBCUTANEOUS

## 2019-03-14 MED ORDER — INSULIN GLARGINE 100 UNIT/ML ~~LOC~~ SOLN
25.0000 [IU] | Freq: Two times a day (BID) | SUBCUTANEOUS | Status: DC
  Administered 2019-03-14 – 2019-03-17 (×7): 25 [IU] via SUBCUTANEOUS
  Filled 2019-03-14 (×9): qty 0.25

## 2019-03-14 MED ORDER — INSULIN GLARGINE 100 UNIT/ML ~~LOC~~ SOLN
8.0000 [IU] | Freq: Every day | SUBCUTANEOUS | Status: DC
  Administered 2019-03-14: 8 [IU] via SUBCUTANEOUS
  Filled 2019-03-14: qty 0.08

## 2019-03-14 MED ORDER — INSULIN ASPART 100 UNIT/ML ~~LOC~~ SOLN
0.0000 [IU] | Freq: Every day | SUBCUTANEOUS | Status: DC
  Administered 2019-03-14 – 2019-03-15 (×2): 3 [IU] via SUBCUTANEOUS

## 2019-03-14 NOTE — Progress Notes (Signed)
MD informed of patient's DTI buttocks he reported to consult wound care for further tx orders at present patient has alleyvn foam dressings to place to cover and protect. MD in talked with patient encouraged him to get off his bottom he refused go back to bed and rest on his side reportedm could not breathe good that way his daughter was informed via MD regarding wound she reported he slept in recliner and and watched tv in recliner would not lie in bed Dr.Ganji talked with her gave above tx orders.

## 2019-03-14 NOTE — Progress Notes (Signed)
Subjective:  Is feeling much better.  Dyspnea is improved.  He has a very deep skin breakdown on his buttocks.he has had good diuresis with 80 mg of furosemide.  Intake/Output from previous day:  I/O last 3 completed shifts: In: 1880 [P.O.:1880] Out: 3675 [Urine:3675] Total I/O In: 243 [P.O.:240; I.V.:3] Out: 450 [Urine:450]  Blood pressure (!) 127/58, pulse 94, temperature 98.6 F (37 C), temperature source Oral, resp. rate 16, height '5\' 9"'  (1.753 m), weight 106.2 kg, SpO2 92 %. Physical Exam  Constitutional: He appears well-developed. No distress.  Morbidly obese  HENT:  Head: Atraumatic.  Eyes: Conjunctivae are normal.  Neck: Neck supple. No JVD present. No thyromegaly present.  Cardiovascular: An irregularly irregular rhythm present. Exam reveals no gallop, no S3 and no S4.  No murmur heard. S1 is variable, S2 is normal.  No murmur appreciated, distant heart sounds. Bilateral lower extremity 2-3+ edema, partially pitting, chronic venous stasis dermatitis noted.  Absent popliteal and pedal pulses bilaterally.  Pulmonary/Chest: Effort normal. He has rales (scattered bilateral, more towards the base).  Abdominal: Soft. Bowel sounds are normal.  Musculoskeletal: Normal range of motion.        General: No edema.  Neurological: He is alert.  Skin: Skin is warm and dry.  Psychiatric: He has a normal mood and affect.   Lab Results: BMP BNP (last 3 results) Recent Labs    09/21/18 1139 03/11/19 1933 03/14/19 0455  BNP 109.0* 513.3* 394.4*    ProBNP (last 3 results) Recent Labs    05/05/18 1535 02/26/19 0958 03/10/19 1605  PROBNP 88.0 399.0* 420.0*   BMP Latest Ref Rng & Units 03/14/2019 03/13/2019 03/12/2019  Glucose 70 - 99 mg/dL 270(H) 189(H) 67(L)  BUN 8 - 23 mg/dL 64(H) 59(H) 53(H)  Creatinine 0.61 - 1.24 mg/dL 3.59(H) 3.70(H) 3.58(H)  BUN/Creat Ratio 10 - 24 - - -  Sodium 135 - 145 mmol/L 138 139 144  Potassium 3.5 - 5.1 mmol/L 4.2 5.2(H) 4.5  Chloride 98 -  111 mmol/L 97(L) 101 102  CO2 22 - 32 mmol/L '29 30 31  ' Calcium 8.9 - 10.3 mg/dL 8.0(L) 7.8(L) 8.4(L)   Hepatic Function Latest Ref Rng & Units 02/26/2019 09/21/2018 11/29/2017  Total Protein 6.0 - 8.3 g/dL 6.6 6.7 7.0  Albumin 3.5 - 5.2 g/dL 3.6 3.5 3.6  AST 0 - 37 U/L 12 14(L) 20  ALT 0 - 53 U/L '14 15 18  ' Alk Phosphatase 39 - 117 U/L 100 101 77  Total Bilirubin 0.2 - 1.2 mg/dL 0.4 0.6 0.8   CBC Latest Ref Rng & Units 03/11/2019 02/26/2019 09/21/2018  WBC 4.0 - 10.5 K/uL 9.8 9.7 7.1  Hemoglobin 13.0 - 17.0 g/dL 10.9(L) 12.8(L) 13.3  Hematocrit 39.0 - 52.0 % 36.9(L) 40.3 42.9  Platelets 150 - 400 K/uL 160 166.0 137(L)   Lipid Panel     Component Value Date/Time   CHOL 135 09/15/2017 0255   TRIG 126 09/20/2017 2143   HDL 28 (L) 09/15/2017 0255   CHOLHDL 4.8 09/15/2017 0255   VLDL 34 09/15/2017 0255   LDLCALC 73 09/15/2017 0255   Cardiac Panel (last 3 results) No results for input(s): CKTOTAL, CKMB, TROPONINI, RELINDX in the last 72 hours.  HEMOGLOBIN A1C Lab Results  Component Value Date   HGBA1C 8.6 (H) 03/13/2019   MPG 200.12 03/13/2019   TSH No results for input(s): TSH in the last 8760 hours. Imaging: Imaging results have been reviewed  Cardiac Studies:  Echocardiogram 03/12/2019: 1. The left ventricle  has normal systolic function with an ejection fraction of 60-65%. The cavity size was normal. There is moderate concentric left ventricular hypertrophy. Left ventricular diastolic function could not be evaluated. 2. The right ventricle has moderately reduced systolic function. The cavity was moderately enlarged. There is no increase in right ventricular wall thickness. Right ventricular systolic pressure could not be assessed with an estimated pressure of 78.7  mmHg. 3. Left atrial size was severely dilated. 4. Right atrial size was moderately dilated. 5. The mitral valve is degenerative. Moderate thickening of the mitral valve leaflet. Moderate calcification of the  mitral valve leaflet. 6. The tricuspid valve is grossly normal. Tricuspid valve regurgitation is moderate. 7. No stenosis of the aortic valve. 8. The inferior vena cava was dilated in size with <50% respiratory variability.  Lexiscan myoview stress test 01/24/2018:  1. Pharmacologic stress testing was performed with intravenous administration of .4 mg of Lexiscan over a 10-15 seconds infusion. Peak BP 162/78 mmHg. Stress symptoms included nausea. Exercise capacity not assessed. Stress EKG is non diagnostic for ischemia as it is a pharmacologic stress.  2. The overall quality of the study is good. There is no evidence of abnormal lung activity. Stress and rest SPECT images demonstrate homogeneous tracer distribution throughout the myocardium, with physiological apical thinning. Gated SPECT imaging reveals normal myocardial thickening and wall motion. The left system ejection fraction was normal (53%).  3. Low risk study.  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 Left ICA 40-59% stenosis. Both the lower end of scale. Both vertebral arteries patent with antegrade flow.  ABI 02/05/2018: This exam reveals normal perfusion of the right lower extremity with mildly abnormal (biphasic) waveform (ABI 0.97) and mildly decreased perfusion of the left lower extremity, with mildly abnormal (biphasic) waveform noted at the post tibial artery level (ABI).  Compared to 01/15/2018, Rt ABI 0.66 Lt ABI 0.77, study suggests successful revascularization.  Lower extremity arteriogram 01/28/2018: Focal prox RT SFA 80% stenosis-->0% residual stenosis after PTA with excellent flow. 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  2 vessel below the knee runoff.  Scheduled Meds: . amLODipine  10 mg Oral Daily  . apixaban  5 mg Oral BID  . aspirin EC  81 mg Oral Daily  . Chlorhexidine Gluconate Cloth  6 each Topical Q0600  . furosemide   80 mg Intravenous Q8H  . insulin aspart  0-15 Units Subcutaneous TID WC  . insulin aspart  0-5 Units Subcutaneous QHS  . insulin glargine  25 Units Subcutaneous BID  . mouth rinse  15 mL Mouth Rinse BID  . metoprolol succinate  100 mg Oral Daily  . mirabegron ER  25 mg Oral Daily  . multivitamin with minerals  1 tablet Oral Daily  . mupirocin ointment  1 application Nasal BID  . Ensure Max Protein  11 oz Oral Daily  . rosuvastatin  40 mg Oral QPC supper  . sildenafil  20 mg Oral TID  . sodium chloride flush  3 mL Intravenous Q12H   Continuous Infusions: . sodium chloride     PRN Meds:.sodium chloride, acetaminophen, dextromethorphan-guaiFENesin, hydrALAZINE, ipratropium-albuterol, nitroGLYCERIN, ondansetron (ZOFRAN) IV, oxymetazoline, sodium chloride flush  Assessment/Plan:  1.  Paroxysmal atrial fibrillation CHA2DS2-VASc Score is 6.  -(CHF; HTN; vasc disease DM,  Male = 1; Age <65 =0; 65-74 = 1,  >75 =2; stroke = 2).  -(Yearly risk of stroke: Score of 1=1.3; 2=2.2; 3=3.2; 4=4; 5=6.7; 6=9.8; 7=>9.8)  EKG 03/11/2019: Atrial  fibrillation with controlled ventricular response at the rate of 83 bpm, left axis deviation, left plantar fascicular.  Right bundle branch block.  Cannot exclude inferior infarct old, lateral infarct old.  Nonspecific T abnormality.   2.  Acute on chronic diastolic heart failure 3.  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  Stage IV chronic kidney disease, on the verge of stage IV.  Recommendation: Patient has responded well to high dose of furosemide, continue the same.  He is tolerating sildenafil started for WHO group II & III PH.  Evaluate him for oxygen needs at home.  He has fairly deep wound and ulcer due to constantly sitting as he is unable to lay down to orthopnea chronically and essentially sits on the chair 24 7.  Reconsult wound care.  No changes were done to his medical regimen today.  Renal function is remained stable.  Poor  long-term prognosis.  I spoke to his daughter.  Adrian Prows, M.D. 03/14/2019, 11:09 PM [facility] Pager: [phone] Office: [phone] If no answer: [phone]

## 2019-03-14 NOTE — Plan of Care (Signed)

## 2019-03-14 NOTE — Progress Notes (Addendum)
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  In ED, hemodynamically stable.  Saturation dropped to 89% and he was put on 3 L by nasal cannula.  BNP 513 (higher than baseline).  EKG A. fib without RVR, LAD and RBBB. Renal function is slightly worse from baseline.  COVID-19 test negative.  Started on IV Lasix and admitted.  Cardiology consulted and started aggressive diuresis with IV Lasix.   Subjective: No major events overnight of this morning.  Reports some improvement in his breathing.  Has had excellent urine output.  Renal function improved.  CBG elevated today.  He was ambulated on room air and desaturated to 85% yesterday.  Assessment & Plan: Acute hypoxemic respiratory failure: Multifactorial including CHF exacerbation, underlying COPD and severe pulmonary hypertension.  Desaturated to 85% with ambulation on room air. -Monitor treatable causes as below. -Daily ambulation.  Acute on chronic diastolic CHF/NYHA III-IV: has cardinal symptoms including dyspnea, orthopnea and LE edema.  Echo this admission with EF of 60 to 65%, moderate LVH, severe LAE, moderate RAE and RVSP to 78 mmHg. About 2 L urine output in the last 24 hours.  Reports improvement in his breathing.  Serum creatinine improved slightly. -Appreciate cardiology input-continue IV Lasix 80 mg 3 times daily -We will monitor daily weight, intake output and renal function -Salt and fluid restrictions -Closely monitor electrolytes and replenish -Ambulate for oxygen assessment  Severe pulmonary hypertension:  Likely cor pulmonale.  RVSP 78 mmHg on echocardiogram -Cardiology managing-started Revatio 20 mg 3 times daily.  Chronic COPD:  I do not believe he is in exacerbation.  Cough has resolved with mucolytics. -Continue PRN and scheduled breathing treatments -Mucolytic's  History of CAD status post CABG: No chest pain.  EKG and troponin reassuring so far. -Continue home aspirin, statin, metoprolol and PRN nitro -Avoid nitrates  CKD4: renal function stable -Continue monitoring while on IV Lasix  Poorly controlled IDDM-2 with renal complication and hyperglycemia: A1c 8.6%.  Apparently on extremely high-dose insulin at home.  -Lantus 25 units twice daily -Increase SSI to moderate -Adjust insulin as appropriate -Continue statin.  Permanent atrial fibrillation: On metoprolol and Eliquis at home -Continue home medications  Hypertension: Normotensive -Continue amlodipine and cardiac meds as above -Amlodipine could contribute to his leg swelling.  Chronic lymphedema -Appreciate wound care input -Could benefit from referral to lymphedema clinic outpatient.  Stage II-III pressure injury over butt cheeks: POA -Foam dressing -Wound care consult  Scheduled Meds: . amLODipine  10 mg Oral Daily  . apixaban  5 mg Oral BID  . aspirin EC  81 mg Oral Daily  . Chlorhexidine Gluconate Cloth  6 each Topical Q0600  . furosemide  80 mg Intravenous Q8H  . insulin aspart  0-9 Units Subcutaneous TID WC  . insulin glargine  8 Units Subcutaneous Daily  . mouth rinse  15 mL Mouth Rinse BID  . metoprolol succinate  100 mg Oral Daily  . mirabegron ER  25 mg Oral Daily  . multivitamin with minerals  1 tablet Oral Daily  . mupirocin ointment  1 application Nasal BID  . Ensure  Max Protein  11 oz Oral Daily  . rosuvastatin  40 mg Oral QPC supper  . sildenafil  20 mg Oral TID  . sodium chloride flush  3 mL Intravenous Q12H   Continuous Infusions: . sodium chloride     PRN Meds:.sodium chloride,  acetaminophen, dextromethorphan-guaiFENesin, hydrALAZINE, ipratropium-albuterol, nitroGLYCERIN, ondansetron (ZOFRAN) IV, oxymetazoline, sodium chloride flush   DVT prophylaxis: On Eliquis for A. fib Code Status: DNR Family Communication: Updated patient's wife over the phone on 5/15 Disposition Plan: Remains inpatient for adequate diuresis with IV Lasix.  Will discharge home when cleared by cardiology.  Consultants:   Cardiology  Procedures:   None  Microbiology: . COVID-19 negative . MRSA PCR positive  Antimicrobials: Anti-infectives (From admission, onward)   None      Objective: Vitals:   03/13/19 1655 03/13/19 1925 03/13/19 2136 03/14/19 0538  BP: 125/66 (!) 135/55 114/61 120/84  Pulse: 66 78  86  Resp:  18    Temp:  98.8 F (37.1 C)  97.9 F (36.6 C)  TempSrc:  Oral  Oral  SpO2:  94%  97%  Weight:    106.2 kg  Height:       Wt Readings from Last 10 Encounters:  03/14/19 106.2 kg  03/10/19 108.5 kg  02/26/19 106.3 kg  02/25/19 107 kg  01/15/19 107.2 kg  12/17/18 102.7 kg  11/24/18 102 kg  09/21/18 101.2 kg  08/22/18 101.2 kg  07/24/18 101.6 kg    Intake/Output Summary (Last 24 hours) at 03/14/2019 0724 Last data filed at 03/14/2019 7741 Gross per 24 hour  Intake 1160 ml  Output 2125 ml  Net -965 ml   Filed Weights   03/12/19 0024 03/13/19 0028 03/14/19 0538  Weight: 107.6 kg 108.3 kg 106.2 kg    Examination:  GENERAL: No acute distress.  Appears well.  HEENT: MMM.  Vision and hearing grossly intact.  NECK: Supple.  No apparent JVD. LUNGS:  No IWOB.  Fair aeration bilaterally.  Fine bibasilar crackles. HEART:  RRR.  S1 and S2 audible. ABD: Bowel sounds present. Soft. Non tender.  MSK/EXT:  Moves all extremities.  Significant venous stasis/insufficiency bilaterally. SKIN: Lymphedema with tree bark skin bilaterally. NEURO: Awake, alert and oriented appropriately.  No gross deficit.  PSYCH: Calm. Normal affect.  Data Reviewed: I have  independently reviewed following labs and imaging studies  CBC: Recent Labs  Lab 03/11/19 1933  WBC 9.8  HGB 10.9*  HCT 36.9*  MCV 86.4  PLT 287   Basic Metabolic Panel: Recent Labs  Lab 03/10/19 1605 03/11/19 1933 03/12/19 0557 03/13/19 0425 03/14/19 0455  NA 143 143 144 139 138  K 4.9 4.5 4.5 5.2* 4.2  CL 103 103 102 101 97*  CO2 34* 30 31 30 29   GLUCOSE 171* 128* 67* 189* 270*  BUN 52* 52* 53* 59* 64*  CREATININE 3.00* 3.42* 3.58* 3.70* 3.59*  CALCIUM 8.4 8.3* 8.4* 7.8* 8.0*  MG  --   --   --  2.6* 2.3   GFR: Estimated Creatinine Clearance: 20.4 mL/min (A) (by C-G formula based on SCr of 3.59 mg/dL (H)). Liver Function Tests: No results for input(s): AST, ALT, ALKPHOS, BILITOT, PROT, ALBUMIN in the last 168 hours. No results for input(s): LIPASE, AMYLASE in the last 168 hours. No results for input(s): AMMONIA in the last 168 hours. Coagulation Profile: No results for input(s): INR, PROTIME in the last 168 hours. Cardiac Enzymes: Recent Labs  Lab 03/11/19 2247  TROPONINI <0.03   BNP (  last 3 results) Recent Labs    05/05/18 1535 02/26/19 0958 03/10/19 1605  PROBNP 88.0 399.0* 420.0*   HbA1C: Recent Labs    03/13/19 0425  HGBA1C 8.6*   CBG: Recent Labs  Lab 03/13/19 0614 03/13/19 1123 03/13/19 1620 03/13/19 2106 03/14/19 0637  GLUCAP 178* 342* 323* 256* 265*   Lipid Profile: No results for input(s): CHOL, HDL, LDLCALC, TRIG, CHOLHDL, LDLDIRECT in the last 72 hours. Thyroid Function Tests: No results for input(s): TSH, T4TOTAL, FREET4, T3FREE, THYROIDAB in the last 72 hours. Anemia Panel: No results for input(s): VITAMINB12, FOLATE, FERRITIN, TIBC, IRON, RETICCTPCT in the last 72 hours. Urine analysis:    Component Value Date/Time   COLORURINE YELLOW 02/16/2018 1450   APPEARANCEUR HAZY (A) 02/16/2018 1450   LABSPEC 1.015 02/16/2018 1450   PHURINE 5.0 02/16/2018 1450   GLUCOSEU >=500 (A) 02/16/2018 1450   HGBUR LARGE (A) 02/16/2018 1450    BILIRUBINUR n 02/26/2019 1035   KETONESUR NEGATIVE 02/16/2018 1450   PROTEINUR Positive (A) 02/26/2019 1035   PROTEINUR 100 (A) 02/16/2018 1450   UROBILINOGEN 0.2 02/26/2019 1035   UROBILINOGEN 1.0 02/22/2013 1318   NITRITE n 02/26/2019 1035   NITRITE NEGATIVE 02/16/2018 1450   LEUKOCYTESUR Negative 02/26/2019 1035   Sepsis Labs: Invalid input(s): PROCALCITONIN, LACTICIDVEN  Recent Results (from the past 240 hour(s))  SARS Coronavirus 2 (CEPHEID- Performed in [place] hospital lab), Hosp Order     Status: None   Collection Time: 03/11/19  8:54 PM  Result Value Ref Range Status   SARS Coronavirus 2 NEGATIVE NEGATIVE Final    Comment: (NOTE) If result is NEGATIVE SARS-CoV-2 target nucleic acids are NOT DETECTED. The SARS-CoV-2 RNA is generally detectable in upper and lower  respiratory specimens during the acute phase of infection. The lowest  concentration of SARS-CoV-2 viral copies this assay can detect is 250  copies / mL. A negative result does not preclude SARS-CoV-2 infection  and should not be used as the sole basis for treatment or other  patient management decisions.  A negative result may occur with  improper specimen collection / handling, submission of specimen other  than nasopharyngeal swab, presence of viral mutation(s) within the  areas targeted by this assay, and inadequate number of viral copies  (<250 copies / mL). A negative result must be combined with clinical  observations, patient history, and epidemiological information. If result is POSITIVE SARS-CoV-2 target nucleic acids are DETECTED. The SARS-CoV-2 RNA is generally detectable in upper and lower  respiratory specimens dur ing the acute phase of infection.  Positive  results are indicative of active infection with SARS-CoV-2.  Clinical  correlation with patient history and other diagnostic information is  necessary to determine patient infection status.  Positive results do  not rule out bacterial  infection or co-infection with other viruses. If result is PRESUMPTIVE POSTIVE SARS-CoV-2 nucleic acids MAY BE PRESENT.   A presumptive positive result was obtained on the submitted specimen  and confirmed on repeat testing.  While 2019 novel coronavirus  (SARS-CoV-2) nucleic acids may be present in the submitted sample  additional confirmatory testing may be necessary for epidemiological  and / or clinical management purposes  to differentiate between  SARS-CoV-2 and other Sarbecovirus currently known to infect humans.  If clinically indicated additional testing with an alternate test  methodology [phone]) is advised. The SARS-CoV-2 RNA is generally  detectable in upper and lower respiratory sp ecimens during the acute  phase of infection. The expected result is Negative.  Fact Sheet for Patients:  StrictlyIdeas.no Fact Sheet for Healthcare Providers: BankingDealers.co.za This test is not yet approved or cleared by the [place] FDA and has been authorized for detection and/or diagnosis of SARS-CoV-2 by FDA under an Emergency Use Authorization (EUA).  This EUA will remain in effect (meaning this test can be used) for the duration of the COVID-19 declaration under Section 564(b)(1) of the Act, 21 U.S.C. section 360bbb-3(b)(1), unless the authorization is terminated or revoked sooner. Performed at [facility], [place] [address] [place], [place] [address]   MRSA PCR Screening     Status: Abnormal   Collection Time: 03/12/19 12:49 AM  Result Value Ref Range Status   MRSA by PCR POSITIVE (A) NEGATIVE Final    Comment:        The GeneXpert MRSA Assay (FDA approved for NASAL specimens only), is one component of a comprehensive MRSA colonization surveillance program. It is not intended to diagnose MRSA infection nor to guide or monitor treatment for MRSA infections. RESULT CALLED TO, READ BACK BY AND VERIFIED WITH: THOMAS,J RN  03/12/2019 AT 0243 SKEEN,P Performed at [facility], [place] [address] [place], [place] [address]       Radiology Studies: No results found.   T. [facility] Pager [phone]  If 7PM-7AM, please contact night-coverage [email or web address] Password [facility] 03/14/2019, 7:24 AM

## 2019-03-14 NOTE — Progress Notes (Addendum)
Pt ambulated on RA SpO2 ranged from 86-92%:  on 2L ambulating SpO2 90-92.  When resting in recliner post ambulation, SpO2 93-94%    Pt refused to be repositioned anywhere but recliner.  Allevyn remains in place on buttocks per possible DTI, [facility] consult on chart.  Bilateral circumferncial lower legs skin: barrier cream heavily applied to dry cracking thick skin and

## 2019-03-15 DIAGNOSIS — L89322 Pressure ulcer of left buttock, stage 2: Secondary | ICD-10-CM

## 2019-03-15 DIAGNOSIS — I5082 Biventricular heart failure: Secondary | ICD-10-CM

## 2019-03-15 LAB — GLUCOSE, CAPILLARY
Glucose-Capillary: 184 mg/dL — ABNORMAL HIGH (ref 70–99)
Glucose-Capillary: 341 mg/dL — ABNORMAL HIGH (ref 70–99)
Glucose-Capillary: 257 mg/dL — ABNORMAL HIGH (ref 70–99)
Glucose-Capillary: 264 mg/dL — ABNORMAL HIGH (ref 70–99)

## 2019-03-15 LAB — BASIC METABOLIC PANEL
Anion gap: 15 (ref 5–15)
BUN: 67 mg/dL — ABNORMAL HIGH (ref 8–23)
CO2: 30 mmol/L (ref 22–32)
Calcium: 8.3 mg/dL — ABNORMAL LOW (ref 8.9–10.3)
Chloride: 96 mmol/L — ABNORMAL LOW (ref 98–111)
Creatinine, Ser: 3.5 mg/dL — ABNORMAL HIGH (ref 0.61–1.24)
GFR calc Af Amer: 18 mL/min — ABNORMAL LOW (ref >60)
GFR calc non Af Amer: 16 mL/min — ABNORMAL LOW (ref >60)
Glucose, Bld: 187 mg/dL — ABNORMAL HIGH (ref 70–99)
Potassium: 3.6 mmol/L (ref 3.5–5.1)
Sodium: 141 mmol/L (ref 135–145)

## 2019-03-15 LAB — LIPID PANEL
Cholesterol: 70 mg/dL (ref 0–200)
HDL: 25 mg/dL — ABNORMAL LOW (ref >40)
LDL Cholesterol: 25 mg/dL (ref 0–99)
Total CHOL/HDL Ratio: 2.8 RATIO
Triglycerides: 102 mg/dL (ref <150)
VLDL: 20 mg/dL (ref 0–40)

## 2019-03-15 LAB — MAGNESIUM: Magnesium: 2.4 mg/dL (ref 1.7–2.4)

## 2019-03-15 LAB — BRAIN NATRIURETIC PEPTIDE: B Natriuretic Peptide: 432.6 pg/mL — ABNORMAL HIGH (ref 0.0–100.0)

## 2019-03-15 MED ORDER — POTASSIUM CHLORIDE CRYS ER 20 MEQ PO TBCR
40.0000 meq | EXTENDED_RELEASE_TABLET | Freq: Once | ORAL | Status: AC
  Administered 2019-03-15: 16:00:00 40 meq via ORAL
  Filled 2019-03-15: qty 2

## 2019-03-15 MED ORDER — GERHARDT'S BUTT CREAM
TOPICAL_CREAM | Freq: Two times a day (BID) | CUTANEOUS | Status: DC
  Administered 2019-03-15 – 2019-03-17 (×4): via TOPICAL
  Filled 2019-03-15: qty 1

## 2019-03-15 NOTE — Plan of Care (Signed)

## 2019-03-15 NOTE — Progress Notes (Signed)
Subjective:  Is feeling much better.  Dyspnea is improved.  So far around 4 L negative Intake/Output from previous day:  I/O last 3 completed shifts: In: 1283 [P.O.:1280; I.V.:3] Out: 4275 [Urine:4275] Total I/O In: -  Out: 300 [Urine:300]  Blood pressure (!) 139/56, pulse 91, temperature 98 F (36.7 C), temperature source Oral, resp. rate 16, height 5' 9" (1.753 m), weight 105.6 kg, SpO2 94 %. Physical Exam  Constitutional: He appears well-developed. No distress.  Morbidly obese  HENT:  Head: Atraumatic.  Eyes: Conjunctivae are normal.  Neck: Neck supple. No JVD present. No thyromegaly present.  Cardiovascular: An irregularly irregular rhythm present. Exam reveals no gallop, no S3 and no S4.  No murmur heard. S1 is variable, S2 is normal.  No murmur appreciated, distant heart sounds. Bilateral lower extremity 2-3+ edema, partially pitting, chronic venous stasis dermatitis noted.  Absent popliteal and pedal pulses bilaterally.  Pulmonary/Chest: Effort normal. He has rales (scattered bilateral, more towards the base).  Abdominal: Soft. Bowel sounds are normal.  Musculoskeletal: Normal range of motion.        General: No edema.  Neurological: He is alert.  Skin: Skin is warm and dry.  Psychiatric: He has a normal mood and affect.   Lab Results: BMP BNP (last 3 results) Recent Labs    03/11/19 1933 03/14/19 0455 03/15/19 0619  BNP 513.3* 394.4* 432.6*    ProBNP (last 3 results) Recent Labs    05/05/18 1535 02/26/19 0958 03/10/19 1605  PROBNP 88.0 399.0* 420.0*   BMP Latest Ref Rng & Units 03/15/2019 03/14/2019 03/13/2019  Glucose 70 - 99 mg/dL 187(H) 270(H) 189(H)  BUN 8 - 23 mg/dL 67(H) 64(H) 59(H)  Creatinine 0.61 - 1.24 mg/dL 3.50(H) 3.59(H) 3.70(H)  BUN/Creat Ratio 10 - 24 - - -  Sodium 135 - 145 mmol/L 141 138 139  Potassium 3.5 - 5.1 mmol/L 3.6 4.2 5.2(H)  Chloride 98 - 111 mmol/L 96(L) 97(L) 101  CO2 22 - 32 mmol/L _0 Calcium 8.9 - 10.3 mg/dL  8.3(L) 8.0(L) 7.8(L)   Hepatic Function Latest Ref Rng & Units 02/26/2019 09/21/2018 11/29/2017  Total Protein 6.0 - 8.3 g/dL 6.6 6.7 7.0  Albumin 3.5 - 5.2 g/dL 3.6 3.5 3.6  AST 0 - 37 U/L 12 14(L) 20  ALT 0 - 53 U/L _1 Alk Phosphatase 39 - 117 U/L 100 101 77  Total Bilirubin 0.2 - 1.2 mg/dL 0.4 0.6 0.8   CBC Latest Ref Rng & Units 03/11/2019 02/26/2019 09/21/2018  WBC 4.0 - 10.5 K/uL 9.8 9.7 7.1  Hemoglobin 13.0 - 17.0 g/dL 10.9(L) 12.8(L) 13.3  Hematocrit 39.0 - 52.0 % 36.9(L) 40.3 42.9  Platelets 150 - 400 K/uL 160 166.0 137(L)   Lipid Panel     Component Value Date/Time   CHOL 70 03/15/2019 0619   TRIG 102 03/15/2019 0619   HDL 25 (L) 03/15/2019 0619   CHOLHDL 2.8 03/15/2019 0619   VLDL 20 03/15/2019 0619   LDLCALC 25 03/15/2019 0619   Cardiac Panel (last 3 results) No results for input(s): CKTOTAL, CKMB, TROPONINI, RELINDX in the last 72 hours.  HEMOGLOBIN A1C Lab Results  Component Value Date   HGBA1C 8.6 (H) 03/13/2019   MPG 200.12 03/13/2019   TSH No results for input(s): TSH in the last 8760 hours. Imaging: Imaging results have been reviewed  Cardiac Studies:  Echocardiogram 03/12/2019: 1. The left ventricle has normal systolic function with an ejection fraction of 60-65%. The cavity size was  normal. There is moderate concentric left ventricular hypertrophy. Left ventricular diastolic function could not be evaluated. 2. The right ventricle has moderately reduced systolic function. The cavity was moderately enlarged. There is no increase in right ventricular wall thickness. Right ventricular systolic pressure could not be assessed with an estimated pressure of 78.7  mmHg. 3. Left atrial size was severely dilated. 4. Right atrial size was moderately dilated. 5. The mitral valve is degenerative. Moderate thickening of the mitral valve leaflet. Moderate calcification of the mitral valve leaflet. 6. The tricuspid valve is grossly normal. Tricuspid valve  regurgitation is moderate. 7. No stenosis of the aortic valve. 8. The inferior vena cava was dilated in size with <50% respiratory variability.  Lexiscan myoview stress test 01/24/2018:  1. Pharmacologic stress testing was performed with intravenous administration of .4 mg of Lexiscan over a 10-15 seconds infusion. Peak BP 162/78 mmHg. Stress symptoms included nausea. Exercise capacity not assessed. Stress EKG is non diagnostic for ischemia as it is a pharmacologic stress.  2. The overall quality of the study is good. There is no evidence of abnormal lung activity. Stress and rest SPECT images demonstrate homogeneous tracer distribution throughout the myocardium, with physiological apical thinning. Gated SPECT imaging reveals normal myocardial thickening and wall motion. The left system ejection fraction was normal (53%).  3. Low risk study.  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 Left ICA 40-59% stenosis. Both the lower end of scale. Both vertebral arteries patent with antegrade flow.  ABI 02/05/2018: This exam reveals normal perfusion of the right lower extremity with mildly abnormal (biphasic) waveform (ABI 0.97) and mildly decreased perfusion of the left lower extremity, with mildly abnormal (biphasic) waveform noted at the post tibial artery level (ABI).  Compared to 01/15/2018, Rt ABI 0.66 Lt ABI 0.77, study suggests successful revascularization.  Lower extremity arteriogram 01/28/2018: Focal prox RT SFA 80% stenosis-->0% residual stenosis after PTA with excellent flow. 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  2 vessel below the knee runoff.  Scheduled Meds: . amLODipine  10 mg Oral Daily  . apixaban  5 mg Oral BID  . aspirin EC  81 mg Oral Daily  . Chlorhexidine Gluconate Cloth  6 each Topical Q0600  . insulin aspart  0-15 Units Subcutaneous TID WC  . insulin aspart  0-5 Units Subcutaneous QHS   . insulin glargine  25 Units Subcutaneous BID  . mouth rinse  15 mL Mouth Rinse BID  . metoprolol succinate  100 mg Oral Daily  . mirabegron ER  25 mg Oral Daily  . multivitamin with minerals  1 tablet Oral Daily  . mupirocin ointment  1 application Nasal BID  . Ensure Max Protein  11 oz Oral Daily  . rosuvastatin  40 mg Oral QPC supper  . sildenafil  20 mg Oral TID  . sodium chloride flush  3 mL Intravenous Q12H   Continuous Infusions: . sodium chloride     PRN Meds:.sodium chloride, acetaminophen, dextromethorphan-guaiFENesin, hydrALAZINE, ipratropium-albuterol, nitroGLYCERIN, ondansetron (ZOFRAN) IV, oxymetazoline, sodium chloride flush  Assessment/Plan:  1.  Paroxysmal atrial fibrillation, rate controlled CHA2DS2-VASc Score is 6.  -(CHF; HTN; vasc disease DM,  Male = 1; Age <65 =0; 65-74 = 1,  >75 =2; stroke = 2).  -(Yearly risk of stroke: Score of 1=1.3; 2=2.2; 3=3.2; 4=4; 5=6.7; 6=9.8; 7=>9.8)  EKG 03/11/2019: Atrial fibrillation with controlled ventricular response at the rate of 83 bpm, left axis deviation, left plantar fascicular.  Right bundle  branch block.  Cannot exclude inferior infarct old, lateral infarct old.  Nonspecific T abnormality.   2.  Acute on chronic diastolic heart failure 3.  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  Stage IV chronic kidney disease, on the verge of stage V.  Recommendation: Patient has responded well to high dose of furosemide, continue the same.  He is tolerating sildenafil started for WHO group II & III PH.  No changes were done to his medical regimen today.  Renal function is remained stable.  Poor long-term prognosis.  Upon discharge, resume present medications and home on Lasix 80 mg BID.  If prior authorization needed for Revatio, I will be happy to do this in the OP basis. Can send Viagra 50 mg tablets with goodRx coupon if necessary for 25 mg TID dosing until  Revatio can be obtained.  D/W Primary team. Home O2 as needed. Wound care.   Adrian Prows, M.D. 03/15/2019, 8:32 AM [facility] Pager: [phone] Office: [phone] If no answer: [phone]

## 2019-03-15 NOTE — Progress Notes (Signed)
Wound care nurse in see patient evaluate bilateral buttocks wound care nurse reported would order geomatt for patient's chair and do Gerharts cream to scrotum and thighs. Wound care nurse reported foam dressing was appropriate for bilateral buttock chronic tissue injury reported partial thickness loss was new today,the area was dry no drainage noted on dressings. Patient has refused to go to bed to relieve pressure to areas he also refused a condom catheter reported had to sit up to urinate appropriately. MD was in and talked with patient regarding all of this aware of wound area. Daughter was informed of area on buttocks as well via MD. No further changes noted.

## 2019-03-15 NOTE — Consult Note (Signed)
[place] Nurse wound consult note Reason for Consult: Reconsulted to evaluate scrotum, partial thickness area on buttock Wound type: chronic tissue injury with partial thickness skin loss due to moisture and friction Pressure Injury POA: NA Measurement: 1.5cm x 1cm x 0.1cm Wound BWI:OMBT, dry Drainage (amount, consistency, odor) none Periwound:intact, dry with red/purple discoloration, improved since yesterday Dressing procedure/placement/frequency: I will provide patient with a pressure redistribution chair cushion (which is superior to a donut cushion for pressure redistribution) that he may take home with him for use in his recliner chair. Topical care will be enhanced with twice daily application of Gerhart's Butt cream to the dry areas of the scrotum and medial thighs.  Gerhart's is a compounded product consisting of hydrocortisone cream/lotrimin/zinc oxide.  [place] nursing team will not follow, but will remain available to this patient, the nursing and medical teams.  Please re-consult if needed. Thanks, Maudie Flakes, MSN, RN, [place], [facility]  Pager# [phone]

## 2019-03-15 NOTE — Progress Notes (Signed)
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  In ED, hemodynamically stable.  Saturation dropped to 89% and he was put on 3 L by nasal cannula.  BNP 513 (higher than baseline).  EKG A. fib without RVR, LAD and RBBB. Renal function is slightly worse from baseline.  COVID-19 test negative.  Started on IV Lasix and admitted.  Cardiology consulted and started aggressive diuresis with IV Lasix milligram twice daily.  Also started on Revatio for pulmonary hypertension.   Subjective: No major events overnight of this morning.  No complaint this morning.  Excellent urine output with IV Lasix. Significant improvement in his breathing.  Renal function improving.  Assessment & Plan: Acute hypoxemic respiratory failure: Multifactorial including CHF exacerbation, underlying COPD and severe pulmonary hypertension.  Desaturated to 85% with ambulation on room air. -Monitor treatable causes as below. -Daily ambulation for O2 requirement  Acute on chronic diastolic CHF/NYHA III-IV: has cardinal symptoms including dyspnea, orthopnea and LE edema.  Echo this admission with EF of 60 to 65%, moderate LVH, severe LAE, moderate RAE and RVSP to 78 mmHg. About 2 L urine output in the last 24 hours.  Reports improvement in his breathing.  Serum creatinine improving. -Appreciate cardiology input-continue IV Lasix 80 mg 3 times daily-and discharged on 80 mg twice daily -We will monitor daily weight, intake output and renal function -Salt and fluid restrictions -Closely monitor electrolytes and replenish  -Ambulate for oxygen daily.  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  RVSP 78 mmHg on echocardiogram -Cardiology managing-started Revatio 20 mg 3 times daily.  Can be discharged on this or Viagra 25 mg 3 times daily with goodRx plan per cardiology recommendation.  Chronic COPD:  I do not believe he is in exacerbation.  Cough has resolved with mucolytics. -Continue PRN and scheduled breathing treatments -Mucolytic's  History of CAD status post CABG: No chest pain.  EKG and troponin reassuring so far. -Continue home aspirin, statin, metoprolol and PRN nitro -Avoid nitrates  CKD4: renal function improving. -Continue monitoring while on IV Lasix  Poorly controlled IDDM-2 with renal complication and hyperglycemia: A1c 8.6%.  Apparently on extremely high-dose insulin at home.  -Increase Lantus to 35 units twice daily -Increase SSI to moderate -Adjust insulin as appropriate -Continue statin.  Permanent atrial fibrillation: On metoprolol and Eliquis at home -Continue home medications  Hypertension: Normotensive -Continue amlodipine and cardiac meds as above -Amlodipine could contribute to his leg swelling.  Chronic lymphedema: Had ABI and reassuring lower extremity arteriogram in 2019.  Patient spends day and night in recliner chair which could have made this worse.  -Will encourage leg elevation -Could benefit from referral to lymphedema clinic outpatient. -Not sure if TED hose helps.  Lower extremity arteriogram appears to be reassuring in 2019.  Stage II skin injury over left buttock cheek and a stage I skin injury over both buttock cheeks -Donut cushion  -Wound care consulted on 5/16 -We will order home health nurse for wound care discharge  Scheduled Meds: . amLODipine  10 mg Oral Daily  . apixaban  5 mg Oral BID  . aspirin EC  81 mg Oral Daily  . Chlorhexidine Gluconate Cloth  6 each Topical Q0600  . insulin aspart  0-15 Units Subcutaneous TID WC  . insulin  aspart  0-5 Units Subcutaneous QHS  . insulin glargine  25 Units Subcutaneous BID  . mouth rinse  15 mL Mouth Rinse BID  . metoprolol succinate  100 mg Oral Daily  . mirabegron ER  25 mg Oral Daily  . multivitamin with minerals  1 tablet Oral Daily  . mupirocin ointment  1 application Nasal BID  . Ensure Max Protein  11 oz Oral Daily  . rosuvastatin  40 mg Oral QPC supper  . sildenafil  20 mg Oral TID  . sodium chloride flush  3 mL Intravenous Q12H   Continuous Infusions: . sodium chloride     PRN Meds:.sodium chloride, acetaminophen, dextromethorphan-guaiFENesin, hydrALAZINE, ipratropium-albuterol, nitroGLYCERIN, ondansetron (ZOFRAN) IV, oxymetazoline, sodium chloride flush   DVT prophylaxis: On Eliquis for A. fib Code Status: DNR Family Communication: Updated patient's wife over the phone on 5/15 Disposition Plan: Remains inpatient for adequate diuresis with IV Lasix.  Will discharge home when cleared by cardiology likely on 5/18  Consultants:   Cardiology  Procedures:   None  Microbiology: . COVID-19 negative . MRSA PCR positive  Antimicrobials: Anti-infectives (From admission, onward)   None      Objective: Vitals:   03/14/19 2221 03/14/19 2225 03/15/19 0305 03/15/19 0357  BP:    (!) 139/56  Pulse:    91  Resp:    16  Temp:    98 F (36.7 C)  TempSrc:    Oral  SpO2: 92% 94%  94%  Weight:   105.6 kg   Height:       Wt Readings from Last 10 Encounters:  03/15/19 105.6 kg  03/10/19 108.5 kg  02/26/19 106.3 kg  02/25/19 107 kg  01/15/19 107.2 kg  12/17/18 102.7 kg  11/24/18 102 kg  09/21/18 101.2 kg  08/22/18 101.2 kg  07/24/18 101.6 kg    Intake/Output Summary (Last 24 hours) at 03/15/2019 0744 Last data filed at 03/15/2019 6834 Gross per 24 hour  Intake 1163 ml  Output 3125 ml  Net -1962 ml   Filed Weights   03/13/19 0028 03/14/19 0538 03/15/19 0305  Weight: 108.3 kg 106.2 kg 105.6 kg    Examination: GENERAL: No acute distress.  Appears  well.  HEENT: MMM.  Vision and hearing grossly intact.  NECK: Supple.  No apparent JVD LUNGS:  No IWOB.  Fair aeration bilaterally.  Fine bibasilar crackles. HEART:  RRR. Heart sounds normal.  ABD: Bowel sounds present. Soft. Non tender.  MSK/EXT:  Moves all extremities. No apparent deformity.  Significant venous stasis and lymphedema bilaterally SKIN: Significant venous stasis and lymphedema bilaterally.  Circular stage II skin wound over right buttock cheek about 1 cm in diameter.  Stage I skin injury over both buttock cheeks NEURO: Awake, alert and oriented appropriately.  No gross deficit.  PSYCH: Calm. Normal affect.         Data Reviewed: I have independently reviewed following labs and imaging studies  CBC: Recent Labs  Lab 03/11/19 1933  WBC 9.8  HGB 10.9*  HCT 36.9*  MCV 86.4  PLT 196   Basic Metabolic Panel: Recent Labs  Lab 03/11/19 1933 03/12/19 0557 03/13/19 0425 03/14/19 0455 03/15/19 0619  NA 143 144 139 138 141  K 4.5 4.5 5.2* 4.2 3.6  CL 103 102 101 97* 96*  CO2 30 31 30 29  30  GLUCOSE 128* 67* 189* 270* 187*  BUN 52* 53* 59* 64* 67*  CREATININE 3.42* 3.58* 3.70* 3.59* 3.50*  CALCIUM 8.3* 8.4* 7.8* 8.0* 8.3*  MG  --   --  2.6* 2.3 2.4   GFR: Estimated Creatinine Clearance: 20.8 mL/min (A) (by C-G formula based on SCr of 3.5 mg/dL (H)). Liver Function Tests: No results for input(s): AST, ALT, ALKPHOS, BILITOT, PROT, ALBUMIN in the last 168 hours. No results for input(s): LIPASE, AMYLASE in the last 168 hours. No results for input(s): AMMONIA in the last 168 hours. Coagulation Profile: No results for input(s): INR, PROTIME in the last 168 hours. Cardiac Enzymes: Recent Labs  Lab 03/11/19 2247  TROPONINI <0.03   BNP (last 3 results) Recent Labs    05/05/18 1535 02/26/19 0958 03/10/19 1605  PROBNP 88.0 399.0* 420.0*   HbA1C: Recent Labs    03/13/19 0425  HGBA1C 8.6*   CBG: Recent Labs  Lab 03/14/19 0637 03/14/19 1117  03/14/19 1619 03/14/19 2100 03/15/19 0556  GLUCAP 265* 249* 386* 277* 184*   Lipid Profile: No results for input(s): CHOL, HDL, LDLCALC, TRIG, CHOLHDL, LDLDIRECT in the last 72 hours. Thyroid Function Tests: No results for input(s): TSH, T4TOTAL, FREET4, T3FREE, THYROIDAB in the last 72 hours. Anemia Panel: No results for input(s): VITAMINB12, FOLATE, FERRITIN, TIBC, IRON, RETICCTPCT in the last 72 hours. Urine analysis:    Component Value Date/Time   COLORURINE YELLOW 02/16/2018 1450   APPEARANCEUR HAZY (A) 02/16/2018 1450   LABSPEC 1.015 02/16/2018 1450   PHURINE 5.0 02/16/2018 1450   GLUCOSEU >=500 (A) 02/16/2018 1450   HGBUR LARGE (A) 02/16/2018 1450   BILIRUBINUR n 02/26/2019 1035   KETONESUR NEGATIVE 02/16/2018 1450   PROTEINUR Positive (A) 02/26/2019 1035   PROTEINUR 100 (A) 02/16/2018 1450   UROBILINOGEN 0.2 02/26/2019 1035   UROBILINOGEN 1.0 02/22/2013 1318   NITRITE n 02/26/2019 1035   NITRITE NEGATIVE 02/16/2018 1450   LEUKOCYTESUR Negative 02/26/2019 1035   Sepsis Labs: Invalid input(s): PROCALCITONIN, LACTICIDVEN  Recent Results (from the past 240 hour(s))  SARS Coronavirus 2 (CEPHEID- Performed in [facility] hospital lab), Hosp Order     Status: None   Collection Time: 03/11/19  8:54 PM  Result Value Ref Range Status   SARS Coronavirus 2 NEGATIVE NEGATIVE Final    Comment: (NOTE) If result is NEGATIVE SARS-CoV-2 target nucleic acids are NOT DETECTED. The SARS-CoV-2 RNA is generally detectable in upper and lower  respiratory specimens during the acute phase of infection. The lowest  concentration of SARS-CoV-2 viral copies this assay can detect is 250  copies / mL. A negative result does not preclude SARS-CoV-2 infection  and should not be used as the sole basis for treatment or other  patient management decisions.  A negative result may occur with  improper specimen collection / handling, submission of specimen other  than nasopharyngeal swab, presence  of viral mutation(s) within the  areas targeted by this assay, and inadequate number of viral copies  (<250 copies / mL). A negative result must be combined with clinical  observations, patient history, and epidemiological information. If result is POSITIVE SARS-CoV-2 target nucleic acids are DETECTED. The SARS-CoV-2 RNA is generally detectable in upper and lower  respiratory specimens dur ing the acute phase of infection.  Positive  results are indicative of active infection with SARS-CoV-2.  Clinical  correlation with patient history and other diagnostic information is  necessary to determine patient infection status.  Positive results do  not rule out  bacterial infection or co-infection with other viruses. If result is PRESUMPTIVE POSTIVE SARS-CoV-2 nucleic acids MAY BE PRESENT.   A presumptive positive result was obtained on the submitted specimen  and confirmed on repeat testing.  While 2019 novel coronavirus  (SARS-CoV-2) nucleic acids may be present in the submitted sample  additional confirmatory testing may be necessary for epidemiological  and / or clinical management purposes  to differentiate between  SARS-CoV-2 and other Sarbecovirus currently known to infect humans.  If clinically indicated additional testing with an alternate test  methodology [phone]) is advised. The SARS-CoV-2 RNA is generally  detectable in upper and lower respiratory sp ecimens during the acute  phase of infection. The expected result is Negative. Fact Sheet for Patients:  StrictlyIdeas.no Fact Sheet for Healthcare Providers: BankingDealers.co.za This test is not yet approved or cleared by the [place] FDA and has been authorized for detection and/or diagnosis of SARS-CoV-2 by FDA under an Emergency Use Authorization (EUA).  This EUA will remain in effect (meaning this test can be used) for the duration of the COVID-19 declaration under Section  564(b)(1) of the Act, 21 U.S.C. section 360bbb-3(b)(1), unless the authorization is terminated or revoked sooner. Performed at [facility], [place] [address], [place], [place] [address]   MRSA PCR Screening     Status: Abnormal   Collection Time: 03/12/19 12:49 AM  Result Value Ref Range Status   MRSA by PCR POSITIVE (A) NEGATIVE Final    Comment:        The GeneXpert MRSA Assay (FDA approved for NASAL specimens only), is one component of a comprehensive MRSA colonization surveillance program. It is not intended to diagnose MRSA infection nor to guide or monitor treatment for MRSA infections. RESULT CALLED TO, READ BACK BY AND VERIFIED WITH: THOMAS,J RN 03/12/2019 AT 0243 SKEEN,P Performed at [facility], [place] [address], [place], [place] [address]       Radiology Studies: No results found.  Dollie Mayse T. [facility] Pager [phone]  If 7PM-7AM, please contact night-coverage [email or web address] Password [facility] 03/15/2019, 7:44 AM

## 2019-03-16 ENCOUNTER — Telehealth: Payer: Self-pay | Admitting: *Deleted

## 2019-03-16 LAB — GLUCOSE, CAPILLARY
Glucose-Capillary: 232 mg/dL — ABNORMAL HIGH (ref 70–99)
Glucose-Capillary: 255 mg/dL — ABNORMAL HIGH (ref 70–99)
Glucose-Capillary: 188 mg/dL — ABNORMAL HIGH (ref 70–99)
Glucose-Capillary: 179 mg/dL — ABNORMAL HIGH (ref 70–99)

## 2019-03-16 LAB — BASIC METABOLIC PANEL
Anion gap: 10 (ref 5–15)
BUN: 68 mg/dL — ABNORMAL HIGH (ref 8–23)
CO2: 31 mmol/L (ref 22–32)
Calcium: 8.1 mg/dL — ABNORMAL LOW (ref 8.9–10.3)
Chloride: 96 mmol/L — ABNORMAL LOW (ref 98–111)
Creatinine, Ser: 3.77 mg/dL — ABNORMAL HIGH (ref 0.61–1.24)
GFR calc Af Amer: 17 mL/min — ABNORMAL LOW (ref >60)
GFR calc non Af Amer: 14 mL/min — ABNORMAL LOW (ref >60)
Glucose, Bld: 255 mg/dL — ABNORMAL HIGH (ref 70–99)
Potassium: 3.8 mmol/L (ref 3.5–5.1)
Sodium: 137 mmol/L (ref 135–145)

## 2019-03-16 LAB — BRAIN NATRIURETIC PEPTIDE: B Natriuretic Peptide: 427.2 pg/mL — ABNORMAL HIGH (ref 0.0–100.0)

## 2019-03-16 LAB — MAGNESIUM: Magnesium: 2.5 mg/dL — ABNORMAL HIGH (ref 1.7–2.4)

## 2019-03-16 MED ORDER — FUROSEMIDE 80 MG PO TABS: 80.0000 mg | ORAL_TABLET | Freq: Two times a day (BID) | ORAL | 0 refills | Status: AC

## 2019-03-16 MED ORDER — INSULIN NPH (HUMAN) (ISOPHANE) 100 UNIT/ML ~~LOC~~ SUSP: 100.0000 [IU] | Freq: Two times a day (BID) | SUBCUTANEOUS | 11 refills | Status: DC

## 2019-03-16 MED ORDER — SILDENAFIL CITRATE 50 MG PO TABS: 25.0000 mg | ORAL_TABLET | Freq: Three times a day (TID) | ORAL | 1 refills | Status: AC

## 2019-03-16 MED ORDER — AMLODIPINE BESYLATE 2.5 MG PO TABS
5.0000 mg | ORAL_TABLET | Freq: Every day | ORAL | Status: DC
  Administered 2019-03-16 – 2019-03-17 (×2): 5 mg via ORAL
  Filled 2019-03-16: qty 2

## 2019-03-16 MED ORDER — SILDENAFIL CITRATE 20 MG PO TABS: 20.0000 mg | ORAL_TABLET | Freq: Three times a day (TID) | ORAL | 0 refills | Status: DC

## 2019-03-16 MED ORDER — INSULIN NPH (HUMAN) (ISOPHANE) 100 UNIT/ML ~~LOC~~ SUSP: 100.0000 [IU] | Freq: Two times a day (BID) | SUBCUTANEOUS | 3 refills | Status: AC

## 2019-03-16 MED FILL — FUROSEMIDE 80 MG TAB: 80 | 30 days supply | Qty: 60 | Fill #0

## 2019-03-16 MED FILL — SILDENAFIL CITRATE 20 MG TA: 20 | 10 days supply | Qty: 30 | Fill #0

## 2019-03-16 NOTE — Telephone Encounter (Signed)
Spoke wit pt wife state that pt is still at the hospital, states that the f/u will be made when pt is discharged from the hospital

## 2019-03-16 NOTE — Progress Notes (Signed)
SATURATION QUALIFICATIONS: (This note is used to comply with regulatory documentation for home oxygen)  Patient Saturations on Room Air at Rest = 91%  Patient Saturations on Room Air while Ambulating = 85%  Patient Saturations on 2 Liters of oxygen while Ambulating = 92%  Please briefly explain why patient needs home oxygen:Pt desaturating on RA with activity and requires 2L to maintain SpO2 >90% [place] [name] Pam Drown, PT Acute Rehabilitation Services Pager: [phone] Office: [phone]

## 2019-03-16 NOTE — Progress Notes (Signed)
Subjective:  Breathing improved. Legs remain swollen. Cr is up to 3.77.   Objective:  Vital Signs in the last 24 hours: Temp:  [97.5 F (36.4 C)-98.4 F (36.9 C)] 97.5 F (36.4 C) (05/18 0458) Pulse Rate:  [75-79] 77 (05/18 0458) Resp:  [18-20] 20 (05/18 0458) BP: (115-137)/(60-64) 119/64 (05/18 0458) SpO2:  [90 %-94 %] 90 % (05/18 0458) Weight:  [105.5 kg] 105.5 kg (05/18 0458)  Intake/Output from previous day: 05/17 0701 - 05/18 0700 In: 1400 [P.O.:1400] Out: 2250 [Urine:2250]  Physical Exam Constitutional: He appears well-developed. No distress.  Morbidly obese  HENT:  Head: Atraumatic.  Eyes: Conjunctivae are normal.  Neck: Neck supple. No JVD present. No thyromegaly present.  Cardiovascular: An irregularly irregular rhythm present. Exam reveals no gallop, no S3 and no S4.  No murmur heard. S1 is variable, S2 is normal.  No murmur appreciated, distant heart sounds. Bilateral lower extremity 2-3+ edema, partially pitting, chronic venous stasis dermatitis noted.  Weak distal pulses.  Pulmonary/Chest: Effort normal. He has rales, L>R  Abdominal: Soft. Bowel sounds are normal.  Musculoskeletal: Normal range of motion.        General: No edema.  Neurological: He is alert.  Skin: Skin is warm and dry.  Psychiatric: He has a normal mood and affect.    Lab Results: BMP Recent Labs    03/14/19 0455 03/15/19 0619 03/16/19 0634  NA 138 141 137  K 4.2 3.6 3.8  CL 97* 96* 96*  CO2 29 30 31   GLUCOSE 270* 187* 255*  BUN 64* 67* 68*  CREATININE 3.59* 3.50* 3.77*  CALCIUM 8.0* 8.3* 8.1*  GFRNONAA 15* 16* 14*  GFRAA 18* 18* 17*    CBC Recent Labs  Lab 03/11/19 1933  WBC 9.8  RBC 4.27  HGB 10.9*  HCT 36.9*  PLT 160  MCV 86.4  MCH 25.5*  MCHC 29.5*  RDW 17.4*    HEMOGLOBIN A1C Lab Results  Component Value Date   HGBA1C 8.6 (H) 03/13/2019   MPG 200.12 03/13/2019    Cardiac Panel (last 3 results) Recent Labs    03/11/19 2247  TROPONINI <0.03     BNP (last 3 results) Recent Labs    03/14/19 0455 03/15/19 0619 03/16/19 0634  BNP 394.4* 432.6* 427.2*    TSH No results for input(s): TSH in the last 8760 hours.  Lipid Panel     Component Value Date/Time   CHOL 70 03/15/2019 0619   TRIG 102 03/15/2019 0619   HDL 25 (L) 03/15/2019 0619   CHOLHDL 2.8 03/15/2019 0619   VLDL 20 03/15/2019 0619   LDLCALC 25 03/15/2019 0619     Hepatic Function Panel Recent Labs    09/21/18 1139 02/26/19 0958  PROT 6.7 6.6  ALBUMIN 3.5 3.6  AST 14* 12  ALT 15 14  ALKPHOS 101 100  BILITOT 0.6 0.4   Cardiac Studies:  EKG 03/11/2019: Afib. Controlled ventricular rate. RBBB. LAFB  Echocardiogram 03/12/2019: 1. The left ventricle has normal systolic function with an ejection fraction of 60-65%. The cavity size was normal. There is moderate concentric left ventricular hypertrophy. Left ventricular diastolic function could not be evaluated. 2. The right ventricle has moderately reduced systolic function. The cavity was moderately enlarged. There is no increase in right ventricular wall thickness. Right ventricular systolic pressure could not be assessed with an estimated pressure of 78.7  mmHg. 3. Left atrial size was severely dilated. 4. Right atrial size was moderately dilated. 5. The mitral valve is degenerative.  Moderate thickening of the mitral valve leaflet. Moderate calcification of the mitral valve leaflet. 6. The tricuspid valve is grossly normal. Tricuspid valve regurgitation is moderate. 7. No stenosis of the aortic valve. 8. The inferior vena cava was dilated in size with <50% respiratory variability.  Lexiscan myoview stress test 01/24/2018:  1. Pharmacologic stress testing was performed with intravenous administration of .4 mg of Lexiscan over a 10-15 seconds infusion. Peak BP 162/78 mmHg. Stress symptoms included nausea. Exercise capacity not assessed. Stress EKG is non diagnostic for ischemia as it is a  pharmacologic stress.  2. The overall quality of the study is good. There is no evidence of abnormal lung activity. Stress and rest SPECT images demonstrate homogeneous tracer distribution throughout the myocardium, with physiological apical thinning. Gated SPECT imaging reveals normal myocardial thickening and wall motion. The leftsystemejection fraction was normal (53%).  3. Low risk study.  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 Left ICA 40-59% stenosis. Both the lower end of scale. Both vertebral arteries patent with antegrade flow.  ABI 02/05/2018: This exam reveals normal perfusion of the right lower extremity with mildly abnormal (biphasic) waveform (ABI 0.97) and mildly decreased perfusion of the left lower extremity, with mildly abnormal (biphasic) waveform noted at the post tibial artery level (ABI).  Compared to 01/15/2018, Rt ABI 0.66 Lt ABI 0.77, study suggests successful revascularization.  Lower extremity arteriogram 01/28/2018: Focal prox RT SFA 80% stenosis-->0% residual stenosis after PTA with excellent flow. 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  2 vessel below the knee runoff.  Assessment & Recommendations:  78 y/o Caucasian male with severe aortic stenosis s/p TAVR 26 mm Edwards Sapien 3 on 12/03/2017, CAD s/p CABG (LIMA-LAD, seq SVG-PDA, PLA, seq SVG-OM1, OM2, SVG-Diag1), mild to mod mitral and tricuspid regurgitation, mod PH, PAD s/p PTA Rt prox SFA 12/2017, paroxysmal Afib, hypertension, type 2 DM, advanced CKD, moderate grp II pulmonary hypertension, admitted to shortness of breath  Acute on chronic HFpEF: 3.6 L net negative for hospital stay. Diuresed well with high dose lasix. Cr continues to trend upwards. I have talked to him about his wishes for potential dialysis. He has been unsure about whether he wants dialysis in the long run. However, I have explained to him that it  would be prudent to plan ahead rather than wait for an emergency dialysis situation. He is willing to see a nephrologist. Okay to hold lasix today, and await nephrology recommendations.  Pulmonary hypertension: WHO Grp II/III. Agree with sildenafil 20 mg tid.  Persistent Afib: Rate controlled. No acute indication for cardivoersion. CHA2DS2VASc score 5. Annual stroke risk 7%. Continue eliquis 5 mg bid.  CAD, s/p TAVR S/p CABG. No angina currently. Continue Aspirin, statin, metoprolol. Reduce amlodipine to 5 mg daily.  RLE, lower back wounds: Needs wound care. Suspect chronic venous insufficieny and lymphedema.  Reduce amlodipine 10 mg to 5 mg daily to see if edema improves.   Uncontrolled DM: Management per the primary team.   Nigel Mormon, M.D. 03/16/2019, 9:32 AM [facility] Pager: [phone] Office: [phone] If no answer: [phone]

## 2019-03-16 NOTE — Discharge Summary (Signed)
Physician Discharge Summary  Cameron [name] YTK:[id number] DOB: 1941/07/07 DOA: 03/11/2019  PCP: Billie Ruddy, MD  Admit date: 03/11/2019 Discharge date: 03/16/2019  Admitted From: Home Disposition: Home  Recommendations for Outpatient Follow-up:  1. Follow up with PCP/cardiology/nephrology/endocrinology in 1 to 2 weeks 2. Please obtain CBC/BMP/Mag at follow up 3. 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  In ED, hemodynamically stable.  Saturation dropped to 89% and he was put on 3 L by nasal cannula.  BNP 513 (higher than baseline).  EKG A. fib without RVR, LAD and RBBB. Renal function is slightly worse from baseline.  COVID-19 test negative.  Started on IV Lasix and admitted.  Cardiology consulted and started aggressive diuresis with IV Lasix 80 milligram twice daily with good response.  Also started on Revatio 20 mg 3 times daily for pulmonary hypertension.   On the day of discharge, cardiology recommended nephrology consult for his CKD-4.  However, patient was upset about further stay in the hospital.  He also says he is not interested in dialysis in the future.  He says he would rather go home now and follow-up with nephrology outpatient.  His renal function has been stable over the last 1 year.  Patient was discharged on p.o. Lasix 80 mg twice daily and Viagra 25 mg 3 times daily.  Imdur was discontinued on discharge.  Patient was ambulated on room air on the day of discharge and desaturated to 85%.  He was discharged on 2 L of oxygen  by nasal cannula to be used with ambulation or activity.   See individual problem list below for more.  Discharge Diagnoses:  Acute hypoxemic respiratory failure: Multifactorial including CHF exacerbation, underlying COPD and severe pulmonary hypertension.  Desaturated to 85% with ambulation on room air.  Discharged on 2 L by nasal cannula with ambulation.  Acute on chronic diastolic CHF/NYHA III-IV: has cardinal symptoms including dyspnea, orthopnea and LE edema.  His wife reports dietary indiscretion. Echo this admission with EF of 60 to 65%, moderate LVH, severe LAE, moderate RAE and RVSP to 78 mmHg.  Has been diuresed with IV Lasix 80 mg 3 times daily with excellent urine output with improvement in his breathing. -Discharged on p.o. Lasix 80 mg twice daily and his other home medications.  Counseled on fluid and salt restriction. -Recheck renal function and fluid status at follow-up and adjust diuretics as appropriate.  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  RVSP 78 mmHg on echocardiogram.  Started on Revatio 20 mg 3 times daily.  Discharged on Viagra 25 mg 3 times daily due to PA with the prior.  Chronic COPD:  I do not believe he is in exacerbation.  Cough has resolved with mucolytics. -Discharged on home medications.  History of CAD status post CABG: No chest pain.  EKG and troponin reassuring so far. -Discharged on aspirin, statin, metoprolol and PRN nitro -Imdur discontinued as patient has been started on Viagra for pulmonary hypertension.  CKD4: Slight uptrend in serum creatinine with IV Lasix but his renal function has been stable over the last 1 year.  Patient is  not interested in dialysis in the future.  No indication for urgent dialysis now. -Recheck BMP at follow-up -Referral to nephrology outpatient  Poorly controlled IDDM-2 with renal complication and hyperglycemia: A1c 8.6%.  On Novolin N 200 units nightly at home.  -Change to Humulin N 100 twice daily  -Continue statin and aspirin -Recommended endocrinology follow-up for this.  Permanent atrial fibrillation: On metoprolol and Eliquis at home -Continue home medications  Hypertension: Normotensive -Continue amlodipine and cardiac meds as above -Amlodipine could contribute to his leg swelling.  Chronic lymphedema: Had ABI and reassuring lower extremity arteriogram in 2019.  Patient spends day and night in recliner chair which could have made this worse.  -Will encourage leg elevation -Could benefit from referral to lymphedema clinic outpatient. -Not sure if TED hose helps.  Lower extremity arteriogram appears to be reassuring in 2019.  Stage II skin injury over left buttock cheek and a stage I skin injury over both buttock cheeks -Discharged with pressure redistribution chair cushion  Discharge Instructions  Discharge Instructions    (HEART FAILURE PATIENTS) Call MD:  Anytime you have any of the following symptoms: 1) 3 pound weight gain in 24 hours or 5 pounds in 1 week 2) shortness of breath, with or without a dry hacking cough 3) swelling in the hands, feet or stomach 4) if you have to sleep on extra pillows at night in order to breathe.   Complete by:  As directed    Call MD for:  difficulty breathing, headache or visual disturbances   Complete by:  As directed    Call MD for:  extreme fatigue   Complete by:  As directed    Call MD for:  persistant dizziness or light-headedness   Complete by:  As directed    Call MD for:  persistant nausea and vomiting   Complete by:  As directed    Diet - low sodium heart healthy   Complete by:  As directed    Diet Carb Modified   Complete by:  As directed    Discharge instructions   Complete by:  As directed    It has been a pleasure taking care of you! Shortness of breath and swellings.  We treated you with fluid medication.  With that, your symptoms improved to the point we think it is safe to let you go home and continue taking your  medications.  We strongly recommend you watching the amount of fluid you drink, which should be less than 1500 cc (6 cups) a day.  We also recommend watching your salt intake and keep it less than 2 g (2000 mg), preferrably less than 1.5 gm (1500 mg) of sodium.  Weigh yourself daily at the same time and keep your weight log.  There are some changes to your medication during this hospitalization.  Please review the medication list and take them as prescribed. Please call and schedule a follow-up appointment with your primary care doctor, endocrinologist and cardiology.  Once you are discharged, your primary care physician will handle any further medical issues. Please note that NO REFILLS for any discharge medications will be authorized once you are discharged, as it is imperative that you return to your primary care physician (or establish a relationship with a primary care physician if you do not have one) for your aftercare needs so that they can reassess your need for medications and monitor your lab values. Take care,   Increase activity slowly   Complete by:  As directed  Allergies as of 03/16/2019      Reactions   Cefuroxime Diarrhea   Ezetimibe Diarrhea      Medication List    STOP taking these medications   isosorbide mononitrate 30 MG 24 hr tablet Commonly known as:  IMDUR   torsemide 20 MG tablet Commonly known as:  DEMADEX     TAKE these medications   amLODipine 10 MG tablet Commonly known as:  NORVASC Take 10 mg by mouth daily.   apixaban 5 MG Tabs tablet Commonly known as:  Eliquis Take 1 tablet (5 mg total) by mouth 2 (two) times daily.   aspirin 81 MG EC tablet Take 1 tablet (81 mg total) by mouth daily.   furosemide 80 MG tablet Commonly known as:  Lasix Take 1 tablet (80 mg total) by mouth 2 (two) times daily for 30 days.   insulin NPH Human 100 UNIT/ML injection Commonly known as:  NovoLIN N Inject 1 mL (100 Units total) into the skin 2 (two) times  daily before a meal. What changed:    how much to take  when to take this  additional instructions   metoprolol succinate 100 MG 24 hr tablet Commonly known as:  TOPROL-XL Take 100 mg by mouth daily.   mirabegron ER 25 MG Tb24 tablet Commonly known as:  Myrbetriq Take 1 tablet (25 mg total) by mouth daily.   nitroGLYCERIN 0.4 MG SL tablet Commonly known as:  NITROSTAT Place 0.4 mg every 5 (five) minutes as needed under the tongue for chest pain.   rosuvastatin 40 MG tablet Commonly known as:  CRESTOR Take 40 mg daily after supper by mouth.   sildenafil 50 MG tablet Commonly known as:  Viagra Take 0.5 tablets (25 mg total) by mouth 3 (three) times daily.      Follow-up Information    Billie Ruddy, MD. Schedule an appointment as soon as possible for a visit in 2 weeks.   Specialty:  Family Medicine Why:  Office will call patient Contact information: [place] [address] [phone]        Adrian Prows, MD. Schedule an appointment as soon as possible for a visit in 1 week(s).   Specialty:  Cardiology Contact information: [address] [place] [address] [phone]           Consultations:  Cardiology  Wound care  Procedures/Studies: 2D Echo:  LVEF 60-65%. S/P TAVR, normal transaortic gradients, peak/mean 19/10 mmHg, no paravalvular leak. Moderate right ventricular dilatation and systolic dysfunction, Severe pulmonary hypertension, RVSP 78 mmHg.  Dg Chest 2 View  Result Date: 03/11/2019 CLINICAL DATA:  78 year old male with shortness of breath and cough EXAM: CHEST - 2 VIEW COMPARISON:  None. FINDINGS: Cardiomediastinal silhouette enlarged, with surgical changes of median sternotomy, CABG, and TAVR. Thickening of the minor fissure. Blunting of the bilateral costophrenic angles with meniscus on the lateral view. Mild interlobular septal thickening. No pneumothorax. No confluent airspace disease. No displaced  fractures. Posttraumatic deformity/postsurgical deformity of right ribs. IMPRESSION: Evidence of pulmonary edema and bilateral pleural effusions with associated atelectasis. Surgical changes of median sternotomy, CABG, and TAVR, with cardiomegaly. Electronically Signed   By: Corrie Mckusick D.O.   On: 03/11/2019 09:11   Dg Chest Port 1 View  Result Date: 03/11/2019 CLINICAL DATA:  Shortness of breath EXAM: PORTABLE CHEST 1 VIEW COMPARISON:  09/21/2018 FINDINGS: Mild cardiomegaly. Remote median sternotomy. No pulmonary edema or focal airspace consolidation. Bibasilar atelectasis. No pleural effusion or  pneumothorax. IMPRESSION: No active disease. Electronically Signed   By: Ulyses Jarred M.D.   On: 03/11/2019 21:23     Subjective: No major events overnight of this morning.  He reports significant improvement in his breathing.  He he says he would like to go home today.  He says he is not waiting for nephrology consult as recommended by cardiology. He says he is not interested in dialysis in the future. He says he would rather go home and follow-up with nephrology as an outpatient.    Discharge Exam: Vitals:   03/16/19 0458 03/16/19 0937  BP: 119/64 132/61  Pulse: 77   Resp: 20   Temp: (!) 97.5 F (36.4 C)   SpO2: 90%    GENERAL: No acute distress.  Appears well.  HEENT: MMM.  Vision and hearing grossly intact.  NECK: Supple.  No apparent JVD LUNGS:  No IWOB.  Fair aeration bilaterally.  Fine bibasilar crackles. HEART:  RRR. Heart sounds normal.  ABD: Bowel sounds present. Soft. Non tender.  MSK/EXT:  Moves all extremities. No apparent deformity.  Significant venous stasis and lymphedema bilaterally SKIN: Significant venous stasis and lymphedema bilaterally.  Circular stage II skin wound over right buttock cheek about 1 cm in diameter.  Stage I skin injury over both buttock cheeks NEURO: Awake, alert and oriented appropriately.  No gross deficit.  PSYCH: Calm. Normal affect.             The results of significant diagnostics from this hospitalization (including imaging, microbiology, ancillary and laboratory) are listed below for reference.     Microbiology: Recent Results (from the past 240 hour(s))  SARS Coronavirus 2 (CEPHEID- Performed in [place] hospital lab), Hosp Order     Status: None   Collection Time: 03/11/19  8:54 PM  Result Value Ref Range Status   SARS Coronavirus 2 NEGATIVE NEGATIVE Final    Comment: (NOTE) If result is NEGATIVE SARS-CoV-2 target nucleic acids are NOT DETECTED. The SARS-CoV-2 RNA is generally detectable in upper and lower  respiratory specimens during the acute phase of infection. The lowest  concentration of SARS-CoV-2 viral copies this assay can detect is 250  copies / mL. A negative result does not preclude SARS-CoV-2 infection  and should not be used as the sole basis for treatment or other  patient management decisions.  A negative result may occur with  improper specimen collection / handling, submission of specimen other  than nasopharyngeal swab, presence of viral mutation(s) within the  areas targeted by this assay, and inadequate number of viral copies  (<250 copies / mL). A negative result must be combined with clinical  observations, patient history, and epidemiological information. If result is POSITIVE SARS-CoV-2 target nucleic acids are DETECTED. The SARS-CoV-2 RNA is generally detectable in upper and lower  respiratory specimens dur ing the acute phase of infection.  Positive  results are indicative of active infection with SARS-CoV-2.  Clinical  correlation with patient history and other diagnostic information is  necessary to determine patient infection status.  Positive results do  not rule out bacterial infection or co-infection with other viruses. If result is PRESUMPTIVE POSTIVE SARS-CoV-2 nucleic acids MAY BE PRESENT.   A presumptive positive result was obtained on the submitted specimen  and  confirmed on repeat testing.  While 2019 novel coronavirus  (SARS-CoV-2) nucleic acids may be present in the submitted sample  additional confirmatory testing may be necessary for epidemiological  and / or clinical management purposes  to differentiate  between  SARS-CoV-2 and other Sarbecovirus currently known to infect humans.  If clinically indicated additional testing with an alternate test  methodology [id number]) is advised. The SARS-CoV-2 RNA is generally  detectable in upper and lower respiratory sp ecimens during the acute  phase of infection. The expected result is Negative. Fact Sheet for Patients:  StrictlyIdeas.no Fact Sheet for Healthcare Providers: BankingDealers.co.za This test is not yet approved or cleared by the [place] FDA and has been authorized for detection and/or diagnosis of SARS-CoV-2 by FDA under an Emergency Use Authorization (EUA).  This EUA will remain in effect (meaning this test can be used) for the duration of the COVID-19 declaration under Section 564(b)(1) of the Act, 21 U.S.C. section 360bbb-3(b)(1), unless the authorization is terminated or revoked sooner. Performed at [facility], [place] [address], [place], [place] [address]   MRSA PCR Screening     Status: Abnormal   Collection Time: 03/12/19 12:49 AM  Result Value Ref Range Status   MRSA by PCR POSITIVE (A) NEGATIVE Final    Comment:        The GeneXpert MRSA Assay (FDA approved for NASAL specimens only), is one component of a comprehensive MRSA colonization surveillance program. It is not intended to diagnose MRSA infection nor to guide or monitor treatment for MRSA infections. RESULT CALLED TO, READ BACK BY AND VERIFIED WITH: THOMAS,J RN 03/12/2019 AT 0243 SKEEN,P Performed at [facility], [place] [address], [place], [place] [address]      Labs: BNP (last 3 results) Recent Labs    03/14/19 0455 03/15/19 0619 03/16/19  0634  BNP 394.4* 432.6* 814.4*   Basic Metabolic Panel: Recent Labs  Lab 03/12/19 0557 03/13/19 0425 03/14/19 0455 03/15/19 0619 03/16/19 0634  NA 144 139 138 141 137  K 4.5 5.2* 4.2 3.6 3.8  CL 102 101 97* 96* 96*  CO2 31 30 29 30 31   GLUCOSE 67* 189* 270* 187* 255*  BUN 53* 59* 64* 67* 68*  CREATININE 3.58* 3.70* 3.59* 3.50* 3.77*  CALCIUM 8.4* 7.8* 8.0* 8.3* 8.1*  MG  --  2.6* 2.3 2.4 2.5*   Liver Function Tests: No results for input(s): AST, ALT, ALKPHOS, BILITOT, PROT, ALBUMIN in the last 168 hours. No results for input(s): LIPASE, AMYLASE in the last 168 hours. No results for input(s): AMMONIA in the last 168 hours. CBC: Recent Labs  Lab 03/11/19 1933  WBC 9.8  HGB 10.9*  HCT 36.9*  MCV 86.4  PLT 160   Cardiac Enzymes: Recent Labs  Lab 03/11/19 2247  TROPONINI <0.03   BNP: Invalid input(s): POCBNP CBG: Recent Labs  Lab 03/15/19 0556 03/15/19 1126 03/15/19 1617 03/15/19 2128 03/16/19 0637  GLUCAP 184* 341* 257* 264* 232*   D-Dimer No results for input(s): DDIMER in the last 72 hours. Hgb A1c No results for input(s): HGBA1C in the last 72 hours. Lipid Profile Recent Labs    03/15/19 0619  CHOL 70  HDL 25*  LDLCALC 25  TRIG 102  CHOLHDL 2.8   Thyroid function studies No results for input(s): TSH, T4TOTAL, T3FREE, THYROIDAB in the last 72 hours.  Invalid input(s): FREET3 Anemia work up No results for input(s): VITAMINB12, FOLATE, FERRITIN, TIBC, IRON, RETICCTPCT in the last 72 hours. Urinalysis    Component Value Date/Time   COLORURINE YELLOW 02/16/2018 1450   APPEARANCEUR HAZY (A) 02/16/2018 1450   LABSPEC 1.015 02/16/2018 1450   PHURINE 5.0 02/16/2018 1450   GLUCOSEU >=500 (A) 02/16/2018 1450  HGBUR LARGE (A) 02/16/2018 1450   BILIRUBINUR n 02/26/2019 1035   KETONESUR NEGATIVE 02/16/2018 1450   PROTEINUR Positive (A) 02/26/2019 1035   PROTEINUR 100 (A) 02/16/2018 1450   UROBILINOGEN 0.2 02/26/2019 1035   UROBILINOGEN 1.0  02/22/2013 1318   NITRITE n 02/26/2019 1035   NITRITE NEGATIVE 02/16/2018 1450   LEUKOCYTESUR Negative 02/26/2019 1035   Sepsis Labs Invalid input(s): PROCALCITONIN,  WBC,  LACTICIDVEN   Time coordinating discharge: 45 minutes  SIGNED:  Mercy Riding, MD  [facility] 03/16/2019, 10:14 AM Pager [phone]  If 7PM-7AM, please contact night-coverage [email or web address] Password [email or web address]

## 2019-03-16 NOTE — Progress Notes (Signed)
Patient ambulated from room to nurses station on room air O2 Stat 94%.O2 Stats post ambulating 90% patient recovered at 94% on room air. Arthor Captain LPN

## 2019-03-16 NOTE — TOC Transition Note (Signed)
Transition of Care [facility]) - CM/SW Discharge Note   Patient Details  Name: Cameron [name] MRN: [id number] Date of Birth: 1941-03-02  Transition of [facility]) CM/SW Contact:  Sherrilyn Rist Phone Number: [phone] 03/16/2019, 2:01 PM   Clinical Narrative:    Patient discharging home today; Home oxygen ordered through [employer], 3:1 ordered also; portable oxygen tank will be delivered to the room today prior to discharging home today; a concentrator will be delivered to the home.    Final next level of care: Home/Self Care Barriers to Discharge: No Barriers Identified   Patient Goals and CMS Choice Patient states their goals for this hospitalization and ongoing recovery are:: to go home CMS Medicare.gov Compare Post Acute Care list provided to:: Patient Choice offered to / list presented to : NA  Discharge Placement                       Discharge Plan and Services In-house Referral: NA Discharge Planning Services: CM Consult Post Acute Care Choice: NA          DME Arranged: N/A DME Agency: NA       HH Arranged: NA HH Agency: NA        Social Determinants of Health (SDOH) Interventions     Readmission Risk Interventions No flowsheet data found.

## 2019-03-16 NOTE — Telephone Encounter (Signed)
Copied from [place] [phone]. Topic: Appointment Scheduling - Scheduling Inquiry for Clinic >> Mar 16, 2019  9:55 AM Erick Blinks wrote: Reason for CRM: Alinda Sierras from [facility] called to schedule pt for post hospital discharge follow up appt. Tried calling office but was unable to connect; message sent instead per Nora's request.  Please advise Best contact: [phone] (Home) / [phone] (Cell)

## 2019-03-16 NOTE — Plan of Care (Signed)
  Problem: Clinical Measurements: Goal: Respiratory complications will improve Outcome: Progressing   Problem: Activity: Goal: Risk for activity intolerance will decrease Outcome: Progressing   Problem: Clinical Measurements: Goal: Cardiovascular complication will be avoided Outcome: Progressing   Problem: Elimination: Goal: Will not experience complications related to urinary retention Outcome: Progressing   Problem: Safety: Goal: Ability to remain free from injury will improve Outcome: Progressing   Problem: Skin Integrity: Goal: Risk for impaired skin integrity will decrease Outcome: Progressing   Problem: Education: Goal: Ability to demonstrate management of disease process will improve Outcome: Progressing   Problem: Cardiac: Goal: Ability to achieve and maintain adequate cardiopulmonary perfusion will improve Outcome: Progressing   Problem: Activity: Goal: Capacity to carry out activities will improve Outcome: Progressing

## 2019-03-16 NOTE — Evaluation (Addendum)
Occupational Therapy Evaluation Patient Details Name: Cameron [name] MRN: [id number] DOB: [birth year] Today's Date: 03/16/2019    History of Present Illness Pt is a 78 y.o. male admitted 03/11/19 with worsening dyspnea. Worked up for CHF exacerbation and severe pulmonary hypertension. 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   Clinical Impression   Pt reports independence as PLOF, drives, [place] being [place] the beach. Today Pt is overall supervision for ADL and transfers. He is tearful multiple times throughout session stating "I don't want to start HD, I am depressed, I want to have dinner at home with my wife tonight" Pt was provided with pillows under bottom and RN secretary ordered geo-mat for sacral wounds. Pt was on RA when OT entered the room, O2 was at 82%, 2L O2 applied and returned to >90%. Educated patient on importance of O2. Pt also provided with [facility] handout and went through energy conservation education with him. At this time recommending 3 in 1 to be used as shower chair as energy conservation technique while showering. OT will follow acutely.  I messaged both attending in the hospital and PCP about a Palliative consult to assist Cameron [name] in his medical decision making as he seems overwhelmed, emotional, and like he is trying to juggle a lot. Palliative medicine can help him manage his multiple chronic medical problems and address his goals, include his family in decision making, and make informed/educated decisions.    Follow Up Recommendations  No OT follow up;Supervision - Intermittent    Equipment Recommendations  3 in 1 bedside commode    Recommendations for Other Services Other (comment)(Palliative Medicine)     Precautions / Restrictions Precautions Precautions: Fall Precaution Comments: Watch SpO2 Restrictions Weight Bearing Restrictions: No      Mobility Bed Mobility               General bed mobility comments:  in recliner at beginning and end of session  Transfers Overall transfer level: Modified independent Equipment used: None                  Balance Overall balance assessment: No apparent balance deficits (not formally assessed)   Sitting balance-Leahy Scale: Good     Standing balance support: No upper extremity supported Standing balance-Leahy Scale: Good                             ADL either performed or assessed with clinical judgement   ADL Overall ADL's : At baseline                                       General ADL Comments: able to reach BLE for don/doff socks, walk to sink to perform sink level grooming and into bathroom for toilet needs (has been using urinal for measuring)     Vision Baseline Vision/History: Wears glasses Wears Glasses: Reading only Patient Visual Report: No change from baseline       Perception     Praxis      Pertinent Vitals/Pain Pain Assessment: Faces Faces Pain Scale: Hurts little more Pain Location: BLE, sacral wound Pain Descriptors / Indicators: Sore Pain Intervention(s): Limited activity within patient's tolerance;Monitored during session;Repositioned;Other (comment)(made sure geo-mat was ordered, put pillows under bottom)     Hand Dominance     Extremity/Trunk Assessment Upper Extremity  Assessment Upper Extremity Assessment: Overall WFL for tasks assessed   Lower Extremity Assessment Lower Extremity Assessment: Overall WFL for tasks assessed(BLE edema and oozing noted)   Cervical / Trunk Assessment Cervical / Trunk Assessment: Normal   Communication Communication Communication: No difficulties   Cognition Arousal/Alertness: Awake/alert Behavior During Therapy: WFL for tasks assessed/performed(tearful multiple times throughout session) Overall Cognitive Status: Within Functional Limits for tasks assessed                                 General Comments: pt reports feeling  depressed over dialysis and wanting to have visitors   General Comments  Pt was on RA when OT entered the room, O2 was at 82%, 2L O2 applied and returned to >90%. Educated patient on importance of O2. Pt also provided with [facility] handout and went through energy conservation education with him.     Exercises     Shoulder Instructions      Home Living Family/patient expects to be discharged to:: Private residence Living Arrangements: Spouse/significant other Available Help at Discharge: Family;Available 24 hours/day Type of Home: House Home Access: Stairs to enter [employer] of Steps: 5 Entrance Stairs-Rails: Right;Left Home Layout: One level     Bathroom Shower/Tub: Teacher, early years/pre: Standard     Home Equipment: None          Prior Functioning/Environment Level of Independence: Independent        Comments: Drives, able to perform household tasks but limited by DOE. Not on home O2        OT Problem List: Decreased activity tolerance;Decreased knowledge of use of DME or AE;Cardiopulmonary status limiting activity;Obesity;Increased edema      OT Treatment/Interventions: Self-care/ADL training;Energy conservation;DME and/or AE instruction;Therapeutic activities;Patient/family education    OT Goals(Current goals can be found in the care plan section) Acute Rehab OT Goals Patient Stated Goal: See my family, eat dinner with my wife tonight OT Goal Formulation: With patient Time For Goal Achievement: 03/30/19 Potential to Achieve Goals: Good ADL Goals Pt Will Perform Grooming: with modified independence;standing Pt Will Perform Lower Body Bathing: with modified independence;sit to/from stand Pt Will Perform Lower Body Dressing: with modified independence;sit to/from stand Pt Will Transfer to Toilet: with modified independence;ambulating Pt Will Perform Toileting - Clothing Manipulation and hygiene: with modified independence;sit to/from  stand Additional ADL Goal #1: Pt will recall and implement 3 energy conservation techniques during ADL routine at independent level  OT Frequency: Min 2X/week   Barriers to D/C:            Co-evaluation              AM-PAC OT "6 Clicks" Daily Activity     Outcome Measure Help from another person eating meals?: None Help from another person taking care of personal grooming?: None Help from another person toileting, which includes using toliet, bedpan, or urinal?: None Help from another person bathing (including washing, rinsing, drying)?: A Little Help from another person to put on and taking off regular upper body clothing?: None Help from another person to put on and taking off regular lower body clothing?: A Little 6 Click Score: 22   End of Session Equipment Utilized During Treatment: Oxygen(2L) Nurse Communication: Mobility status;Other (comment)(O2 levels and emptied 300 of urine)  Activity Tolerance: Patient tolerated treatment well Patient left: in chair;with call bell/phone within reach  OT Visit Diagnosis: Muscle weakness (generalized) (M62.81)  Time: 2761-8485 OT Time Calculation (min): 41 min Charges:  OT General Charges $OT Visit: 1 Visit OT Evaluation $OT Eval Moderate Complexity: 1 Mod OT Treatments $Self Care/Home Management : 8-22 mins $Therapeutic Activity: 8-22 mins  Hulda Humphrey OTR/L Acute Rehabilitation Services Pager: [phone] Office: [place] 03/16/2019, 12:07 PM

## 2019-03-16 NOTE — Progress Notes (Signed)
Physical Therapy Treatment Patient Details Name: Cameron [name] MRN: [id number] DOB: 21-Oct-1941 Today's Date: 03/16/2019    History of Present Illness Pt is a 78 y.o. male admitted 03/11/19 with worsening dyspnea. Worked up for CHF exacerbation and severe pulmonary hypertension. 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    PT Comments    Pt with flat affect, reports being depressed and wanting to talk to the doctors about HD and wounds so he can be at home tonight with wife. Pt with slow but steady mobility and able to perform stairs with rail. PT with assist for pericare due to wounds and completeness. HEP deferred as pt in discussion with MD end of session. Pt educated for desaturation on RA and need for home O2.   SpO2 at rest on RA 91% Drop to 84% on RA with gait 92% on 2L with gait    Follow Up Recommendations  No PT follow up;Supervision for mobility/OOB     Equipment Recommendations  None recommended by PT    Recommendations for Other Services       Precautions / Restrictions Precautions Precautions: Fall Precaution Comments: Watch SpO2 Restrictions Weight Bearing Restrictions: No    Mobility  Bed Mobility               General bed mobility comments: Received sitting in recliner  Transfers Overall transfer level: Modified independent                  Ambulation/Gait Ambulation/Gait assistance: Supervision Gait Distance (Feet): 200 Feet Assistive device: None Gait Pattern/deviations: Step-through pattern;Decreased stride length;Drifts right/left;Wide base of support;Trunk flexed   Gait velocity interpretation: >2.62 ft/sec, indicative of community ambulatory General Gait Details: pt with kyphotic trunk with wide BOS, slow and steady with mild drift but unable to perform challenges like change in speed or head turns. SpO2 dropping to 85% on RA with gait, return to 2L   Stairs Stairs: Yes Stairs  assistance: Min guard Stair Management: Forwards;Alternating pattern;One rail Right Number of Stairs: 5 General stair comments: pt with use of rail to ascend/descend safely but slowly with reliance on railing for support   Wheelchair Mobility    Modified Rankin (Stroke Patients Only)       Balance Overall balance assessment: Needs assistance   Sitting balance-Leahy Scale: Good     Standing balance support: No upper extremity supported Standing balance-Leahy Scale: Good                              Cognition Arousal/Alertness: Awake/alert Behavior During Therapy: Flat affect Overall Cognitive Status: Within Functional Limits for tasks assessed                                 General Comments: pt reports feeling depressed over dialysis and wanting to have visitors      Exercises      General Comments        Pertinent Vitals/Pain Pain Assessment: No/denies pain    Home Living                      Prior Function            PT Goals (current goals can now be found in the care plan section) Progress towards PT goals: Progressing toward goals    Frequency  PT Plan Current plan remains appropriate    Co-evaluation              AM-PAC PT "6 Clicks" Mobility   Outcome Measure  Help needed turning from your back to your side while in a flat bed without using bedrails?: None Help needed moving from lying on your back to sitting on the side of a flat bed without using bedrails?: None Help needed moving to and from a bed to a chair (including a wheelchair)?: None Help needed standing up from a chair using your arms (e.g., wheelchair or bedside chair)?: None Help needed to walk in hospital room?: A Little Help needed climbing 3-5 steps with a railing? : A Little 6 Click Score: 22    End of Session Equipment Utilized During Treatment: Gait belt Activity Tolerance: Patient tolerated treatment well Patient  left: in chair;with call bell/phone within reach;Other (comment)(with MD) Nurse Communication: Mobility status PT Visit Diagnosis: Other abnormalities of gait and mobility (R26.89);Muscle weakness (generalized) (M62.81)     Time: 6546-5035 PT Time Calculation (min) (ACUTE ONLY): 21 min  Charges:  $Gait Training: 8-22 mins                     Rubby Barbary Pam Drown, PT Acute Rehabilitation Services Pager: [phone] Office: [place] 03/16/2019, 11:15 AM

## 2019-03-17 ENCOUNTER — Telehealth: Payer: Self-pay | Admitting: Family Medicine

## 2019-03-17 DIAGNOSIS — I5082 Biventricular heart failure: Secondary | ICD-10-CM

## 2019-03-17 DIAGNOSIS — I272 Pulmonary hypertension, unspecified: Secondary | ICD-10-CM

## 2019-03-17 DIAGNOSIS — I5033 Acute on chronic diastolic (congestive) heart failure: Secondary | ICD-10-CM

## 2019-03-17 DIAGNOSIS — I480 Paroxysmal atrial fibrillation: Secondary | ICD-10-CM

## 2019-03-17 LAB — BASIC METABOLIC PANEL
Anion gap: 11 (ref 5–15)
BUN: 72 mg/dL — ABNORMAL HIGH (ref 8–23)
CO2: 29 mmol/L (ref 22–32)
Calcium: 8.4 mg/dL — ABNORMAL LOW (ref 8.9–10.3)
Chloride: 100 mmol/L (ref 98–111)
Creatinine, Ser: 3.66 mg/dL — ABNORMAL HIGH (ref 0.61–1.24)
GFR calc Af Amer: 17 mL/min — ABNORMAL LOW (ref >60)
GFR calc non Af Amer: 15 mL/min — ABNORMAL LOW (ref >60)
Glucose, Bld: 136 mg/dL — ABNORMAL HIGH (ref 70–99)
Potassium: 4.1 mmol/L (ref 3.5–5.1)
Sodium: 140 mmol/L (ref 135–145)

## 2019-03-17 LAB — URINALYSIS, ROUTINE W REFLEX MICROSCOPIC
Bacteria, UA: NONE SEEN
Bilirubin Urine: NEGATIVE
Glucose, UA: 50 mg/dL — AB
Ketones, ur: NEGATIVE mg/dL
Leukocytes,Ua: NEGATIVE
Nitrite: NEGATIVE
Protein, ur: 100 mg/dL — AB
Specific Gravity, Urine: 1.014 (ref 1.005–1.030)
pH: 7 (ref 5.0–8.0)

## 2019-03-17 LAB — MAGNESIUM: Magnesium: 2.7 mg/dL — ABNORMAL HIGH (ref 1.7–2.4)

## 2019-03-17 LAB — GLUCOSE, CAPILLARY
Glucose-Capillary: 125 mg/dL — ABNORMAL HIGH (ref 70–99)
Glucose-Capillary: 218 mg/dL — ABNORMAL HIGH (ref 70–99)

## 2019-03-17 MED ORDER — AMLODIPINE BESYLATE 5 MG PO TABS: 5.0000 mg | ORAL_TABLET | Freq: Every day | ORAL | 0 refills | Status: AC

## 2019-03-17 MED FILL — AMLODIPINE BESYLATE 5 MG TA: 5 | 30 days supply | Qty: 30 | Fill #0

## 2019-03-17 NOTE — Progress Notes (Signed)
Occupational Therapy Treatment and Discharge Patient Details Name: Cameron [name] MRN: [id number] DOB: Mar 10, 1941 Today's Date: 03/17/2019    History of present illness Pt is a 78 y.o. male admitted 03/11/19 with worsening dyspnea. Worked up for CHF exacerbation and severe pulmonary hypertension. 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   OT comments  This 78 yo male admitted with above presents to acute OT really depressed and wants to go home--his biggest goal. He does need some A with LB ADLs and reports his wife can A him prn. No further OT needs we will sign off.  Follow Up Recommendations  No OT follow up;Supervision - Intermittent    Equipment Recommendations  3 in 1 bedside commode       Precautions / Restrictions Precautions Precautions: Fall Precaution Comments: Watch SpO2 Restrictions Weight Bearing Restrictions: No       Mobility Bed Mobility               General bed mobility comments: in recliner at beginning and end of session  Transfers Overall transfer level: Needs assistance Equipment used: None Transfers: Sit to/from Stand Sit to Stand: Min guard         General transfer comment: Ambulated with min guard A without AD 100 feet    Balance Overall balance assessment: No apparent balance deficits (not formally assessed)                                         ADL either performed or assessed with clinical judgement   ADL                                         General ADL Comments: Pt stated he could not do his socks today, that he needed A and that his wife would A him at home. Legs with dry scalely skin and reddened areas. Pt able to ambulate to bathroom and get up and down from 3n1 over toilet with min A and stand at sink to wash hands with min guard A.  We talked about his wife helping him with toileting hygiene due to his buttock wounds and he agreed      Vision Baseline Vision/History: Wears glasses Wears Glasses: Reading only Patient Visual Report: No change from baseline            Cognition Arousal/Alertness: Awake/alert Behavior During Therapy: WFL for tasks assessed/performed                                   General Comments: pt reports feeling depressed, he said he just wanted to go home and die. When questioned with MD present if he meant to take his own life--he said no he just wanted to go home, I am depressed.                 Frequency  Min 2X/week        Progress Toward Goals  OT Goals(current goals can now be found in the care plan section)  Progress towards OT goals: (progressing with mobility but not LBADLs. )     Plan Discharge plan remains appropriate       AM-PAC OT "6  Clicks" Daily Activity     Outcome Measure   Help from another person eating meals?: None Help from another person taking care of personal grooming?: A Little Help from another person toileting, which includes using toliet, bedpan, or urinal?: A Little Help from another person bathing (including washing, rinsing, drying)?: A Little Help from another person to put on and taking off regular upper body clothing?: A Little Help from another person to put on and taking off regular lower body clothing?: A Lot 6 Click Score: 18    End of Session Equipment Utilized During Treatment: Oxygen(2 liters. Pt stated that Cardiologist stated he did not need to wear O2; however pt did drop to 86 on RA so re-iterated to pt that he does need the O2)  OT Visit Diagnosis: Muscle weakness (generalized) (M62.81)   Activity Tolerance Patient tolerated treatment well   Patient Left in chair;with call bell/phone within reach;with chair alarm set   Nurse Communication          Time: [phone] OT Time Calculation (min): 19 min  Charges: OT General Charges $OT Visit: 1 Visit OT Treatments $Self Care/Home Management : 8-22  mins  [place], OTR/L Acute Rehab Services Pager [phone] Office [phone]      Almon Register 03/17/2019, 5:37 PM

## 2019-03-17 NOTE — Care Management Important Message (Signed)
Important Message  Patient Details  Name: Cameron [name] MRN: [id number] Date of Birth: 12-21-1940   Medicare Important Message Given:  Yes    Orbie Pyo 03/17/2019, 2:43 PM

## 2019-03-17 NOTE — TOC Transition Note (Signed)
Transition of Care [facility]) - CM/SW Discharge Note   Patient Details  Name: Cameron [name] MRN: [id number] Date of Birth: 1941-01-05  [facility] of [facility]) CM/SW Contact:  Sherrilyn Rist Phone Number: [phone] 03/17/2019, 12:50 PM   Clinical Narrative:    Patient is for discharge home today; [place] arranged with [facility]; home oxygen has been delivered along with 3:1;   Final next level of care: Home w Home Health Services Barriers to Discharge: No Barriers Identified   Patient Goals and CMS Choice Patient states their goals for this hospitalization and ongoing recovery are:: to go home CMS Medicare.gov Compare Post Acute Care list provided to:: Patient Choice offered to / list presented to : Patient  Discharge Placement                       Discharge Plan and Services In-house Referral: NA Discharge Planning Services: CM Consult Post Acute Care Choice: NA          DME Arranged: N/A DME Agency: NA       HH Arranged: RN [place] Agency: [place] ([place]) Date Exline: 03/17/19 Time [place]: [place] Representative spoke with at [place]: Hydrographic surveyor  Social Determinants of Health ([place]) Interventions     Readmission Risk Interventions No flowsheet data found.

## 2019-03-17 NOTE — Telephone Encounter (Signed)
Copied from [place] [phone]. Topic: Appointment Scheduling - Scheduling Inquiry for Clinic >> Mar 17, 2019  2:52 PM Mathis Bud wrote: Reason for CRM: [place] called from [facility], heart failure.  Patient is being released today 5/19 needs HFU in 2 weeks. Please call patient to schedule [phone]

## 2019-03-17 NOTE — Progress Notes (Addendum)
Subjective:  Breathing improved. Legs remain swollen. Cr is 3.66 today.  Objective:  Vital Signs in the last 24 hours: Temp:  [98.2 F (36.8 C)-100 F (37.8 C)] 98.4 F (36.9 C) (05/19 0832) Pulse Rate:  [64-100] 92 (05/19 0832) Resp:  [18] 18 (05/19 0523) BP: (107-135)/(62-80) 131/69 (05/19 0832) SpO2:  [91 %-98 %] 94 % (05/19 0832) Weight:  [104.3 kg] 104.3 kg (05/19 0523)  Intake/Output from previous day: 05/18 0701 - 05/19 0700 In: 860 [P.O.:860] Out: 1700 [Urine:1700]  Physical Exam Constitutional: He appears well-developed. No distress.  Morbidly obese  HENT:  Head: Atraumatic.  Eyes: Conjunctivae are normal.  Neck: Neck supple. No JVD present. No thyromegaly present.  Cardiovascular: An irregularly irregular rhythm present. Exam reveals no gallop, no S3 and no S4.  No murmur heard. S1 is variable, S2 is normal.  No murmur appreciated, distant heart sounds. Bilateral lower extremity 2-3+ edema, partially pitting, chronic venous stasis dermatitis noted.  Weak distal pulses.  Pulmonary/Chest: Effort normal. He has rales, L>R  Abdominal: Soft. Bowel sounds are normal.  Musculoskeletal: Normal range of motion.        General: No edema.  Neurological: He is alert.  Skin: Skin is warm and dry.  Psychiatric: He has a normal mood and affect.    Lab Results: BMP Recent Labs    03/15/19 0619 03/16/19 0634 03/17/19 0507  NA 141 137 140  K 3.6 3.8 4.1  CL 96* 96* 100  CO2 30 31 29   GLUCOSE 187* 255* 136*  BUN 67* 68* 72*  CREATININE 3.50* 3.77* 3.66*  CALCIUM 8.3* 8.1* 8.4*  GFRNONAA 16* 14* 15*  GFRAA 18* 17* 17*    CBC Recent Labs  Lab 03/11/19 1933  WBC 9.8  RBC 4.27  HGB 10.9*  HCT 36.9*  PLT 160  MCV 86.4  MCH 25.5*  MCHC 29.5*  RDW 17.4*    HEMOGLOBIN A1C Lab Results  Component Value Date   HGBA1C 8.6 (H) 03/13/2019   MPG 200.12 03/13/2019    Cardiac Panel (last 3 results) Recent Labs    03/11/19 2247  TROPONINI <0.03    BNP  (last 3 results) Recent Labs    03/14/19 0455 03/15/19 0619 03/16/19 0634  BNP 394.4* 432.6* 427.2*    TSH No results for input(s): TSH in the last 8760 hours.  Lipid Panel     Component Value Date/Time   CHOL 70 03/15/2019 0619   TRIG 102 03/15/2019 0619   HDL 25 (L) 03/15/2019 0619   CHOLHDL 2.8 03/15/2019 0619   VLDL 20 03/15/2019 0619   LDLCALC 25 03/15/2019 0619     Hepatic Function Panel Recent Labs    09/21/18 1139 02/26/19 0958  PROT 6.7 6.6  ALBUMIN 3.5 3.6  AST 14* 12  ALT 15 14  ALKPHOS 101 100  BILITOT 0.6 0.4   Cardiac Studies:  EKG 03/11/2019: Afib. Controlled ventricular rate. RBBB. LAFB  Echocardiogram 03/12/2019: 1. The left ventricle has normal systolic function with an ejection fraction of 60-65%. The cavity size was normal. There is moderate concentric left ventricular hypertrophy. Left ventricular diastolic function could not be evaluated. 2. The right ventricle has moderately reduced systolic function. The cavity was moderately enlarged. There is no increase in right ventricular wall thickness. Right ventricular systolic pressure could not be assessed with an estimated pressure of 78.7  mmHg. 3. Left atrial size was severely dilated. 4. Right atrial size was moderately dilated. 5. The mitral valve is degenerative. Moderate thickening  of the mitral valve leaflet. Moderate calcification of the mitral valve leaflet. 6. The tricuspid valve is grossly normal. Tricuspid valve regurgitation is moderate. 7. No stenosis of the aortic valve. 8. The inferior vena cava was dilated in size with <50% respiratory variability.  Lexiscan myoview stress test 01/24/2018:  1. Pharmacologic stress testing was performed with intravenous administration of .4 mg of Lexiscan over a 10-15 seconds infusion. Peak BP 162/78 mmHg. Stress symptoms included nausea. Exercise capacity not assessed. Stress EKG is non diagnostic for ischemia as it is a pharmacologic  stress.  2. The overall quality of the study is good. There is no evidence of abnormal lung activity. Stress and rest SPECT images demonstrate homogeneous tracer distribution throughout the myocardium, with physiological apical thinning. Gated SPECT imaging reveals normal myocardial thickening and wall motion. The leftsystemejection fraction was normal (53%).  3. Low risk study.  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 Left ICA 40-59% stenosis. Both the lower end of scale. Both vertebral arteries patent with antegrade flow.  ABI 02/05/2018: This exam reveals normal perfusion of the right lower extremity with mildly abnormal (biphasic) waveform (ABI 0.97) and mildly decreased perfusion of the left lower extremity, with mildly abnormal (biphasic) waveform noted at the post tibial artery level (ABI).  Compared to 01/15/2018, Rt ABI 0.66 Lt ABI 0.77, study suggests successful revascularization.  Lower extremity arteriogram 01/28/2018: Focal prox RT SFA 80% stenosis-->0% residual stenosis after PTA with excellent flow. 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  2 vessel below the knee runoff.  Assessment & Recommendations:  78 y/o Caucasian male with severe aortic stenosis s/p TAVR 26 mm Edwards Sapien 3 on 12/03/2017, CAD s/p CABG (LIMA-LAD, seq SVG-PDA, PLA, seq SVG-OM1, OM2, SVG-Diag1), mild to mod mitral and tricuspid regurgitation, mod PH, PAD s/p PTA Rt prox SFA 12/2017, paroxysmal Afib, hypertension, type 2 DM, advanced CKD, moderate grp II pulmonary hypertension, admitted to shortness of breath  Acute on chronic HFpEF: 4.7 L net negative for hospital stay. Diuresed well with high dose lasix. Appreciate nephrology input.  Conitnue lasix 80 mg bid.   Pulmonary hypertension: WHO Grp II/III. Agree with sildenafil 20 mg tid. Agree with home oxygen given hypoxia on ambulation.  Persistent Afib: Rate  controlled. No acute indication for cardivoersion. CHA2DS2VASc score 5. Annual stroke risk 7%. Continue eliquis 5 mg bid.  CAD, s/p TAVR S/p CABG. No angina currently. Continue Aspirin, statin, metoprolol. Reduce amlodipine to 5 mg daily.  RLE, lower back wounds: Needs wound care. Suspect chronic venous insufficieny and lymphedema.  Reduced amlodipine 10 mg to 5 mg daily to see if edema improves.  Consider home health for wound care  Uncontrolled DM: Management per the primary team.  Okay with discharge today.    Nigel Mormon, M.D. 03/17/2019, 10:18 AM [facility] Pager: [phone] Office: [phone] If no answer: [phone]

## 2019-03-17 NOTE — Discharge Summary (Signed)
Physician Discharge Summary  Cameron [name] IRC:[id number] DOB: 11/14/1940 DOA: 03/11/2019  PCP: Billie Ruddy, MD  Admit date: 03/11/2019 Discharge date: 03/17/2019  Admitted From: Home Disposition: Home  Recommendations for Outpatient Follow-up:  1. Follow up with PCP/cardiology/nephrology/endocrinology in 1 to 2 weeks 2. Please obtain CBC/BMP/Mag at follow up 3. 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  In ED, hemodynamically stable.  Saturation dropped to 89% and he was put on 3 L by nasal cannula.  BNP 513 (higher than baseline).  EKG A. fib without RVR, LAD and RBBB. Renal function is slightly worse from baseline.  COVID-19 test negative.  Started on IV Lasix and admitted.  Cardiology consulted and started aggressive diuresis with IV Lasix 80 milligram twice daily with good response.  Also started on Revatio 20 mg 3 times daily for pulmonary hypertension.   Serum creatinine up trended to 3.7. Cardiology recommended nephrology consult on 03/16/19.  However, patient was upset about further stay in the hospital.  He also says he is not interested in dialysis in the future.  He says he would rather go home now and follow-up with nephrology outpatient.  After discussion between his cardiologist and his family, patient finally agreed to stay overnight.   The next day, patient was evaluated by nephrology who recommended urinalysis and outpatient follow-up with nephrology.  Nephrology recommended discharge on oral Lasix 40 mg daily.  Urinalysis  with 50 glucose, moderate hemoglobin, 100 protein otherwise not impressive.  Patient was discharged on p.o. Lasix 80 mg twice daily and Viagra 25 mg 3 times daily.  Imdur was discontinued on discharge.  Patient was ambulated on room air on the day of discharge and desaturated to 85%.  He was discharged on 2 L of oxygen by nasal cannula to be used with ambulation or activity.  I have also ordered home OT and RN on discharge.  See individual problem list below for more.  Patient's daughter updated on the day of discharge.   Discharge Diagnoses:  Acute hypoxemic respiratory failure: Multifactorial including CHF exacerbation, underlying COPD and severe pulmonary hypertension.  Desaturated to 85% with ambulation on room air.  Discharged on 2 L by nasal cannula with ambulation.  Acute on chronic diastolic CHF/NYHA III-IV: has cardinal symptoms including dyspnea, orthopnea and LE edema.  His wife reports dietary indiscretion. Echo this admission with EF of 60 to 65%, moderate LVH, severe LAE, moderate RAE and RVSP to 78 mmHg.  Has been diuresed with IV Lasix 80 mg 3 times daily with excellent urine output with improvement in his breathing. -Discharged on p.o. Lasix 80 mg twice daily and his other home medications.  Counseled on fluid and salt restriction. -Recheck renal function and fluid status at follow-up and adjust diuretics as appropriate.  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  RVSP 78 mmHg on echocardiogram.  Started on Revatio 20 mg 3 times daily.  Discharged on Viagra 25 mg 3 times daily due to PA with the prior.  Chronic COPD:  I do not believe he is in exacerbation.  Cough has resolved with mucolytics. -Discharged on home  medications.  History of CAD status post CABG: No chest pain.  EKG and troponin reassuring so far. -Discharged on aspirin, statin, metoprolol and PRN nitro -Imdur discontinued as patient has been started on Viagra for pulmonary hypertension.  CKD4:  Slight uptrend in serum creatinine with IV Lasix but his renal function has been stable over the last 1 year.  Patient is not interested in dialysis in the future.  No indication for urgent dialysis now. -Recheck BMP at follow-up -Referral to nephrology outpatient  Poorly controlled IDDM-2 with renal complication and hyperglycemia: A1c 8.6%.  On Novolin N 200 units nightly at home.  -Change to Humulin N 100 twice daily -Continue statin and aspirin -Recommended endocrinology follow-up for this.  Permanent atrial fibrillation: On metoprolol and Eliquis at home -Continue home medications  Hypertension: Normotensive -Continue amlodipine and cardiac meds as above -Decreased on amlodipine to 5 mg daily.  Chronic lymphedema: Had ABI and reassuring lower extremity arteriogram in 2019.  Patient spends day and night in recliner chair which could have made this worse.  -Will encourage leg elevation -Could benefit from referral to lymphedema clinic outpatient. -Not sure if TED hose helps.  Lower extremity arteriogram appears to be reassuring in 2019. -Home RN ordered on discharge.  Stage II skin injury over left buttock cheek and a stage I skin injury over both buttock cheeks -Discharged with pressure redistribution chair cushion -Home OT and RN ordered on discharge.  Discharge Instructions  Discharge Instructions    (HEART FAILURE PATIENTS) Call MD:  Anytime you have any of the following symptoms: 1) 3 pound weight gain in 24 hours or 5 pounds in 1 week 2) shortness of breath, with or without a dry hacking cough 3) swelling in the hands, feet or stomach 4) if you have to sleep on extra pillows at night in order to breathe.   Complete by:  As directed    (HEART FAILURE PATIENTS) Call MD:  Anytime you have any of the following symptoms: 1) 3 pound weight gain in 24 hours or 5 pounds in 1 week 2) shortness of breath, with or without a dry hacking cough 3) swelling in the hands, feet or stomach  4) if you have to sleep on extra pillows at night in order to breathe.   Complete by:  As directed    Call MD for:  difficulty breathing, headache or visual disturbances   Complete by:  As directed    Call MD for:  difficulty breathing, headache or visual disturbances   Complete by:  As directed    Call MD for:  extreme fatigue   Complete by:  As directed    Call MD for:  extreme fatigue   Complete by:  As directed    Call MD for:  persistant dizziness or light-headedness   Complete by:  As directed    Call MD for:  persistant dizziness or light-headedness   Complete by:  As directed    Call MD for:  persistant nausea and vomiting   Complete by:  As directed    Call MD for:  persistant nausea and vomiting   Complete by:  As directed    Call MD for:  redness, tenderness, or signs of infection (pain, swelling, redness, odor or green/yellow discharge around incision site)   Complete by:  As directed    Call MD for:  temperature >100.4   Complete by:  As directed    Diet - low sodium heart healthy   Complete by:  As directed    Diet - low sodium heart healthy   Complete by:  As directed    Diet Carb Modified   Complete by:  As directed    Diet Carb Modified   Complete by:  As directed    Discharge instructions   Complete by:  As directed    It has been a pleasure taking care of you! Shortness of breath and swellings.  We treated you with fluid medication.  With that, your symptoms improved to the point we think it is safe to let you go home and continue taking your medications.  We strongly recommend you watching the amount of fluid you drink, which should be less than 1500 cc (6 cups) a day.  We also recommend watching your salt intake and keep it less than 2 g (2000 mg), preferrably less than 1.5 gm (1500 mg) of sodium.  Weigh yourself daily at the same time and keep your weight log.  There are some changes to your medication during this hospitalization.  Please review the medication  list and take them as prescribed. Please call and schedule a follow-up appointment with your primary care doctor, endocrinologist and cardiology.  Once you are discharged, your primary care physician will handle any further medical issues. Please note that NO REFILLS for any discharge medications will be authorized once you are discharged, as it is imperative that you return to your primary care physician (or establish a relationship with a primary care physician if you do not have one) for your aftercare needs so that they can reassess your need for medications and monitor your lab values. Take care,   Discharge instructions   Complete by:  As directed    It has been a pleasure taking care of you! Shortness of breath and swellings.  We treated you with fluid medication.  With that, your symptoms improved to the point we think it is safe to let you go home and continue taking your medications.  We strongly recommend you watching the amount of fluid you drink, which should be less than 1500 cc (6 cups) a day.  We also recommend watching your salt intake and keep it less than 2 g (2000 mg), preferrably less than 1.5 gm (1500 mg) of sodium.  Weigh yourself daily at the same time and keep your weight log.  There are some changes to your medication during this hospitalization.  Please review the medication list and take them as prescribed. Please call and schedule a follow-up appointment with your primary care doctor, endocrinologist, nephrologist and cardiologist.  Once you are discharged, your primary care physician will handle any further medical issues. Please note that NO REFILLS for any discharge medications will be authorized once you are discharged, as it is imperative that you return to your primary care physician (or establish a relationship with a primary care physician if you do not have one) for your aftercare needs so that they can reassess your need for medications and monitor your lab values.  Take care,   Increase activity slowly   Complete by:  As directed    Increase activity slowly   Complete by:  As directed      Allergies as of 03/17/2019      Reactions   Cefuroxime Diarrhea   Ezetimibe Diarrhea      Medication List    STOP taking these medications   isosorbide mononitrate 30 MG 24 hr tablet Commonly known as:  IMDUR   torsemide 20  MG tablet Commonly known as:  DEMADEX     TAKE these medications   amLODipine 5 MG tablet Commonly known as:  NORVASC Take 1 tablet (5 mg total) by mouth daily. Start taking on:  Mar 18, 2019 What changed:    medication strength  how much to take   apixaban 5 MG Tabs tablet Commonly known as:  Eliquis Take 1 tablet (5 mg total) by mouth 2 (two) times daily.   aspirin 81 MG EC tablet Take 1 tablet (81 mg total) by mouth daily.   furosemide 80 MG tablet Commonly known as:  Lasix Take 1 tablet (80 mg total) by mouth 2 (two) times daily for 30 days.   insulin NPH Human 100 UNIT/ML injection Commonly known as:  NovoLIN N Inject 1 mL (100 Units total) into the skin 2 (two) times daily before a meal. What changed:    how much to take  when to take this  additional instructions   metoprolol succinate 100 MG 24 hr tablet Commonly known as:  TOPROL-XL Take 100 mg by mouth daily.   mirabegron ER 25 MG Tb24 tablet Commonly known as:  Myrbetriq Take 1 tablet (25 mg total) by mouth daily.   nitroGLYCERIN 0.4 MG SL tablet Commonly known as:  NITROSTAT Place 0.4 mg every 5 (five) minutes as needed under the tongue for chest pain.   rosuvastatin 40 MG tablet Commonly known as:  CRESTOR Take 40 mg daily after supper by mouth.   sildenafil 50 MG tablet Commonly known as:  Viagra Take 0.5 tablets (25 mg total) by mouth 3 (three) times daily.            Durable Medical Equipment  (From admission, onward)         Start     Ordered   03/16/19 1432  For home use only DME 3 n 1  Once     03/16/19 1431    03/16/19 1431  For home use only DME oxygen  Once    Comments:  [facility] with ambulation.  Question Answer Comment  Mode or (Route) Nasal cannula   Liters per Minute 2   Frequency Continuous (stationary and portable oxygen unit needed)   Oxygen delivery system Gas      03/16/19 1431         Follow-up Information    Billie Ruddy, MD. Schedule an appointment as soon as possible for a visit in 2 weeks.   Specialty:  Family Medicine Why:  Office will call patient Contact information: [place] [address] [phone]        Adrian Prows, MD. Schedule an appointment as soon as possible for a visit in 1 week(s).   Specialty:  Cardiology Contact information: [place] [address] [place], [facility].   Specialty:  Home Health Services Why:  A home health care nurse will go to your home          Consultations:  Cardiology  Wound care  Procedures/Studies: 2D Echo:  LVEF 60-65%. S/P TAVR, normal transaortic gradients, peak/mean 19/10 mmHg, no paravalvular leak. Moderate right ventricular dilatation and systolic dysfunction, Severe pulmonary hypertension, RVSP 78 mmHg.  Dg Chest 2 View  Result Date: 03/11/2019 CLINICAL DATA:  78 year old male with shortness of breath and cough EXAM: CHEST - 2 VIEW COMPARISON:  None. FINDINGS: Cardiomediastinal silhouette enlarged, with surgical changes of median sternotomy, CABG,  and TAVR. Thickening of the minor fissure. Blunting of the bilateral costophrenic angles with meniscus on the lateral view. Mild interlobular septal thickening. No pneumothorax. No confluent airspace disease. No displaced fractures. Posttraumatic deformity/postsurgical deformity of right ribs. IMPRESSION: Evidence of pulmonary edema and bilateral pleural effusions with associated atelectasis. Surgical changes of median sternotomy, CABG, and TAVR, with cardiomegaly.  Electronically Signed   By: Corrie Mckusick D.O.   On: 03/11/2019 09:11   Dg Chest Port 1 View  Result Date: 03/11/2019 CLINICAL DATA:  Shortness of breath EXAM: PORTABLE CHEST 1 VIEW COMPARISON:  09/21/2018 FINDINGS: Mild cardiomegaly. Remote median sternotomy. No pulmonary edema or focal airspace consolidation. Bibasilar atelectasis. No pleural effusion or pneumothorax. IMPRESSION: No active disease. Electronically Signed   By: Ulyses Jarred M.D.   On: 03/11/2019 21:23     Subjective: No major events overnight of this morning.  He reports significant improvement in his breathing.  He he says he would like to go home today.  He says he is not waiting for nephrology consult as recommended by cardiology. He says he is not interested in dialysis in the future. He says he would rather go home and follow-up with nephrology as an outpatient.    Discharge Exam: Vitals:   03/17/19 0523 03/17/19 0832  BP: 130/62 131/69  Pulse: 84 92  Resp: 18   Temp: 98.2 F (36.8 C) 98.4 F (36.9 C)  SpO2: 98% 94%   GENERAL: No acute distress.  Appears well.  HEENT: MMM.  Vision and hearing grossly intact.  NECK: Supple.  No apparent JVD LUNGS:  No IWOB.  Fair aeration bilaterally.  Fine bibasilar crackles. HEART:  RRR. Heart sounds normal.  ABD: Bowel sounds present. Soft. Non tender.  MSK/EXT:  Moves all extremities. No apparent deformity.  Significant venous stasis and lymphedema bilaterally SKIN: Significant venous stasis and lymphedema bilaterally.  Circular stage II skin wound over right buttock cheek about 1 cm in diameter.  Stage I skin injury over both buttock cheeks NEURO: Awake, alert and oriented appropriately.  No gross deficit.  PSYCH: Calm. Normal affect.            The results of significant diagnostics from this hospitalization (including imaging, microbiology, ancillary and laboratory) are listed below for reference.     Microbiology: Recent Results (from the past 240 hour(s))   SARS Coronavirus 2 (CEPHEID- Performed in [facility] hospital lab), Hosp Order     Status: None   Collection Time: 03/11/19  8:54 PM  Result Value Ref Range Status   SARS Coronavirus 2 NEGATIVE NEGATIVE Final    Comment: (NOTE) If result is NEGATIVE SARS-CoV-2 target nucleic acids are NOT DETECTED. The SARS-CoV-2 RNA is generally detectable in upper and lower  respiratory specimens during the acute phase of infection. The lowest  concentration of SARS-CoV-2 viral copies this assay can detect is 250  copies / mL. A negative result does not preclude SARS-CoV-2 infection  and should not be used as the sole basis for treatment or other  patient management decisions.  A negative result may occur with  improper specimen collection / handling, submission of specimen other  than nasopharyngeal swab, presence of viral mutation(s) within the  areas targeted by this assay, and inadequate number of viral copies  (<250 copies / mL). A negative result must be combined with clinical  observations, patient history, and epidemiological information. If result is POSITIVE SARS-CoV-2 target nucleic acids are DETECTED. The SARS-CoV-2 RNA is generally detectable in upper and  lower  respiratory specimens dur ing the acute phase of infection.  Positive  results are indicative of active infection with SARS-CoV-2.  Clinical  correlation with patient history and other diagnostic information is  necessary to determine patient infection status.  Positive results do  not rule out bacterial infection or co-infection with other viruses. If result is PRESUMPTIVE POSTIVE SARS-CoV-2 nucleic acids MAY BE PRESENT.   A presumptive positive result was obtained on the submitted specimen  and confirmed on repeat testing.  While 2019 novel coronavirus  (SARS-CoV-2) nucleic acids may be present in the submitted sample  additional confirmatory testing may be necessary for epidemiological  and / or clinical management  purposes  to differentiate between  SARS-CoV-2 and other Sarbecovirus currently known to infect humans.  If clinically indicated additional testing with an alternate test  methodology [phone]) is advised. The SARS-CoV-2 RNA is generally  detectable in upper and lower respiratory sp ecimens during the acute  phase of infection. The expected result is Negative. Fact Sheet for Patients:  StrictlyIdeas.no Fact Sheet for Healthcare Providers: BankingDealers.co.za This test is not yet approved or cleared by the [place] FDA and has been authorized for detection and/or diagnosis of SARS-CoV-2 by FDA under an Emergency Use Authorization (EUA).  This EUA will remain in effect (meaning this test can be used) for the duration of the COVID-19 declaration under Section 564(b)(1) of the Act, 21 U.S.C. section 360bbb-3(b)(1), unless the authorization is terminated or revoked sooner. Performed at [facility], [place] [address] [place], [place] [address]   MRSA PCR Screening     Status: Abnormal   Collection Time: 03/12/19 12:49 AM  Result Value Ref Range Status   MRSA by PCR POSITIVE (A) NEGATIVE Final    Comment:        The GeneXpert MRSA Assay (FDA approved for NASAL specimens only), is one component of a comprehensive MRSA colonization surveillance program. It is not intended to diagnose MRSA infection nor to guide or monitor treatment for MRSA infections. RESULT CALLED TO, READ BACK BY AND VERIFIED WITH: THOMAS,J RN 03/12/2019 AT 0243 SKEEN,P Performed at [facility], [place] [address] [place], [place] [address]      Labs: BNP (last 3 results) Recent Labs    03/14/19 0455 03/15/19 0619 03/16/19 0634  BNP 394.4* 432.6* 591.6*   Basic Metabolic Panel: Recent Labs  Lab 03/13/19 0425 03/14/19 0455 03/15/19 0619 03/16/19 0634 03/17/19 0507  NA 139 138 141 137 140  K 5.2* 4.2 3.6 3.8 4.1  CL 101 97* 96* 96* 100   CO2 30 29 30 31 29   GLUCOSE 189* 270* 187* 255* 136*  BUN 59* 64* 67* 68* 72*  CREATININE 3.70* 3.59* 3.50* 3.77* 3.66*  CALCIUM 7.8* 8.0* 8.3* 8.1* 8.4*  MG 2.6* 2.3 2.4 2.5* 2.7*   Liver Function Tests: No results for input(s): AST, ALT, ALKPHOS, BILITOT, PROT, ALBUMIN in the last 168 hours. No results for input(s): LIPASE, AMYLASE in the last 168 hours. No results for input(s): AMMONIA in the last 168 hours. CBC: Recent Labs  Lab 03/11/19 1933  WBC 9.8  HGB 10.9*  HCT 36.9*  MCV 86.4  PLT 160   Cardiac Enzymes: Recent Labs  Lab 03/11/19 2247  TROPONINI <0.03   BNP: Invalid input(s): POCBNP CBG: Recent Labs  Lab 03/16/19 1157 03/16/19 1641 03/16/19 2112 03/17/19 0624 03/17/19 1107  GLUCAP 255* 188* 179* 125* 218*   D-Dimer No results for input(s): DDIMER in the last  72 hours. Hgb A1c No results for input(s): HGBA1C in the last 72 hours. Lipid Profile Recent Labs    03/15/19 0619  CHOL 70  HDL 25*  LDLCALC 25  TRIG 102  CHOLHDL 2.8   Thyroid function studies No results for input(s): TSH, T4TOTAL, T3FREE, THYROIDAB in the last 72 hours.  Invalid input(s): FREET3 Anemia work up No results for input(s): VITAMINB12, FOLATE, FERRITIN, TIBC, IRON, RETICCTPCT in the last 72 hours. Urinalysis    Component Value Date/Time   COLORURINE YELLOW 03/17/2019 1054   APPEARANCEUR CLEAR 03/17/2019 1054   LABSPEC 1.014 03/17/2019 1054   PHURINE 7.0 03/17/2019 1054   GLUCOSEU 50 (A) 03/17/2019 1054   HGBUR MODERATE (A) 03/17/2019 1054   BILIRUBINUR NEGATIVE 03/17/2019 1054   BILIRUBINUR n 02/26/2019 1035   KETONESUR NEGATIVE 03/17/2019 1054   PROTEINUR 100 (A) 03/17/2019 1054   UROBILINOGEN 0.2 02/26/2019 1035   UROBILINOGEN 1.0 02/22/2013 1318   NITRITE NEGATIVE 03/17/2019 1054   LEUKOCYTESUR NEGATIVE 03/17/2019 1054   Sepsis Labs Invalid input(s): PROCALCITONIN,  WBC,  LACTICIDVEN   Time coordinating discharge: 35 minutes  SIGNED:  Mercy Riding, MD   [facility] 03/17/2019, 1:45 PM Pager [phone]  If 7PM-7AM, please contact night-coverage [email or web address] Password [email or web address]

## 2019-03-17 NOTE — Care Management Important Message (Signed)
Important Message  Patient Details  Name: Cameron [name] MRN: [id number] Date of Birth: [birth year]   Medicare Important Message Given:  Yes    Orbie Pyo 03/17/2019, 2:45 PM

## 2019-03-17 NOTE — H&P (Addendum)
[facility]  HISTORY AND PHYSICAL  Cameron [name] is an 78 y.o. male.    Attending: Dr. Justin Mend Resident: Dr. Eileen Stanford  Reason for Consult: Worsening Renal Function   Chief Complaint:  Shortness of Breath  HPI: Cameron [name] is a 78 year old gentleman with hypertension, hyperlipidemia, type 2 diabetes mellitus, non-oxygen dependent COPD, coronary artery disease status post CABG, CKD stage IV, right bundle branch block, diastolic heart failure, paroxysmal atrial fibrillation on Eliquis, aortic stenosis status post TAVR, morbid obesity and venous insufficiency who initially presented to [facility] on Mar 11, 2019 with worsening shortness of breath.   He was found to be in heart failure exacerbation though his vital signs were unremarkable in the emergency department.  He had an episode of hypoxia with oxygen saturation to about 89% and was placed on 2 L nasal cannula.  He was aggressively diuresed with IV Lasix and was noted to have good response.  An echocardiogram was obtained during this admission and reviewed an ejection fraction of 60 to 65%, moderate left ventricular hypertrophy, severe pulmonary hypertension with RVSP of 78 mmHg.  Given that he was aggressively diuresed with IV Lasix 80 mg 3 times daily, his renal function had deteriorated.  On admission serum creatinine was 3 and had peaked to 3.7 however has stabilized at approximately 3.6 in the past 3 days.  His GFR in the past year has been stable at around 14-20.  Nephrology was consulted for input regarding his renal function.  Per hospitalist team, a discussion of possible dialysis in the future was initiated however Cameron [name] was adamant on receiving dialysis.  Today, Cameron [name] was examined in the recliner after his physical therapy session.  He denies oliguria, anuria, nausea, vomiting, chest pain, pruritus, headaches, dizziness, nausea, vomiting.  He was a bit frustrated about why he is still admitted to the hospital and voices  that he wants to go home. He states he is depressed about being at the hospital but denies suicidal or homicidal ideation.  PMH: Past Medical History:  Diagnosis Date  . CAD (coronary artery disease)    a. 2002: s/p CABG x 4V (LIMA-LAD, seq SVG-PDA, PLA, seq SVG-OM1, OM2, SVG-Diag1)  . CKD (chronic kidney disease)   . COPD (chronic obstructive pulmonary disease) ([place])   . Diabetes mellitus without complication ([place])   . HLD (hyperlipidemia)   . Hypertension   . Obesity   . PAF (paroxysmal atrial fibrillation) ([place])   . RBBB   . 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 Severe aortic stenosis    PSH: Past Surgical History:  Procedure Laterality Date  . ABDOMINAL AORTAGRAM Right 01/28/2018   LOWER EXTREMITY  . ABDOMINAL AORTOGRAM W/LOWER EXTREMITY N/A 01/28/2018   Procedure: ABDOMINAL AORTOGRAM W/LOWER EXTREMITY [place];  Surgeon: Nigel Mormon, MD;  Location: [place] [facility];  Service: Cardiovascular;  Laterality: N/A;  . CARDIAC SURGERY    . CORONARY BALLOON ANGIOPLASTY N/A 09/23/2017   Procedure: CORONARY BALLOON ANGIOPLASTY;  Surgeon: Nigel Mormon, MD;  Location: [place] [facility];  Service: Cardiovascular;  Laterality: N/A;  . FINGER AMPUTATION    . HERNIA REPAIR    . open heart surgery    . PERIPHERAL VASCULAR BALLOON ANGIOPLASTY  01/28/2018   Procedure: PERIPHERAL VASCULAR BALLOON ANGIOPLASTY;  Surgeon: Nigel Mormon, MD;  Location: [place] [facility];  Service: Cardiovascular;;  Rt SFA  .  RIGHT HEART CATH AND CORONARY/GRAFT ANGIOGRAPHY N/A 09/17/2017   Procedure: RIGHT HEART CATH AND CORONARY/GRAFT ANGIOGRAPHY;  Surgeon: Nigel Mormon, MD;  Location: [place] [facility];  Service: Cardiovascular;  Laterality: N/A;  . TEE WITHOUT CARDIOVERSION N/A 12/03/2017   Procedure: TRANSESOPHAGEAL ECHOCARDIOGRAM (TEE);  Surgeon: Sherren Mocha, MD;  Location:  [place];  Service: Open Heart Surgery;  Laterality: N/A;  . TOE AMPUTATION    . TRANSCATHETER AORTIC VALVE REPLACEMENT, TRANSFEMORAL N/A 12/03/2017   Procedure: TRANSCATHETER AORTIC VALVE REPLACEMENT, TRANSFEMORAL;  Surgeon: Sherren Mocha, MD;  Location: [place];  Service: Open Heart Surgery;  Laterality: N/A;  . ULTRASOUND GUIDANCE FOR VASCULAR ACCESS  09/17/2017   Procedure: Ultrasound Guidance For Vascular Access;  Surgeon: Nigel Mormon, MD;  Location: [place] [facility];  Service: Cardiovascular;;    Past Medical History:  Diagnosis Date  . CAD (coronary artery disease)    a. 2002: s/p CABG x 4V (LIMA-LAD, seq SVG-PDA, PLA, seq SVG-OM1, OM2, SVG-Diag1)  . CKD (chronic kidney disease)   . COPD (chronic obstructive pulmonary disease) ([place])   . Diabetes mellitus without complication ([place])   . HLD (hyperlipidemia)   . Hypertension   . Obesity   . PAF (paroxysmal atrial fibrillation) ([place])   . RBBB   . 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 Severe aortic stenosis     Medications:  I have reviewed the patient's current medications.  Medications Prior to Admission  Medication Sig Dispense Refill  . amLODipine (NORVASC) 10 MG tablet Take 10 mg by mouth daily.    Marland Kitchen apixaban (ELIQUIS) 5 MG TABS tablet Take 1 tablet (5 mg total) by mouth 2 (two) times daily. 180 tablet 2  . aspirin EC 81 MG EC tablet Take 1 tablet (81 mg total) by mouth daily.    . metoprolol succinate (TOPROL-XL) 100 MG 24 hr tablet Take 100 mg by mouth daily.    . nitroGLYCERIN (NITROSTAT) 0.4 MG SL tablet Place 0.4 mg every 5 (five) minutes as needed under the tongue for chest pain.    . rosuvastatin (CRESTOR) 40 MG tablet Take 40 mg daily after supper by mouth.     . torsemide (DEMADEX) 20 MG tablet Take two tablets once daily (Patient taking differently: Take 20 mg by mouth See admin instructions. Take 20 mg  by mouth at 7 AM and 20 mg at 10 AM) 60 tablet 1  . [DISCONTINUED] insulin NPH Human (NOVOLIN N RELION) 100 UNIT/ML injection Inject 2 mLs (200 Units total) into the skin every morning. And syringes 2/day (Patient taking differently: Inject 200 Units into the skin daily before breakfast. ) 70 mL 11  . isosorbide mononitrate (IMDUR) 30 MG 24 hr tablet Take 1 tablet (30 mg total) by mouth daily. (Patient not taking: Reported on 03/11/2019) 30 tablet 11  . mirabegron ER (MYRBETRIQ) 25 MG TB24 tablet Take 1 tablet (25 mg total) by mouth daily. 30 tablet 5    ALLERGIES:   Allergies  Allergen Reactions  . Cefuroxime Diarrhea  . Ezetimibe Diarrhea    FAM HX: Family History  Problem Relation Age of Onset  . Diabetes Mother   . Heart disease Father   . Stomach cancer Paternal Grandfather   . Lung cancer Maternal Grandmother        smoked    Social History:   reports that he quit smoking about 40 years ago. His  smoking use included cigarettes. He has a 40.00 pack-year smoking history. He has never used smokeless tobacco. He reports that he does not drink alcohol or use drugs.  ROS: Review of Systems  Constitutional: Negative for chills and fever.  Eyes: Negative for blurred vision and double vision.  Respiratory: Negative for shortness of breath and wheezing.   Cardiovascular: Negative for chest pain, palpitations and leg swelling.  Gastrointestinal: Negative for abdominal pain, nausea and vomiting.  Musculoskeletal: Negative for myalgias.  Skin: Positive for rash.       Chronic stasis dermatitis  Neurological: Negative for dizziness, seizures, weakness and headaches.  Psychiatric/Behavioral: Positive for depression. The patient is not nervous/anxious.     Blood pressure 131/69, pulse 92, temperature 98.4 F (36.9 C), temperature source Oral, resp. rate 18, height 5\' 9"  (1.753 m), weight 104.3 kg, SpO2 94 %. PHYSICAL EXAM: Physical Exam  Nursing note and vitals  reviewed. Constitutional: No distress.  Obese  HENT:  Head: Normocephalic and atraumatic.  Eyes: Conjunctivae are normal. Right eye exhibits no discharge. Left eye exhibits no discharge. No scleral icterus.  Neck: Normal range of motion. Neck supple. JVD: Difficult to examine.  Cardiovascular: Normal heart sounds.  Irregular  Respiratory: Effort normal. He has rales (Mild bibasilar rales).  GI: Soft. There is no abdominal tenderness.  Musculoskeletal:        General: Edema present.  Skin: Rash (Chronic stasis dermatitis) noted. He is not diaphoretic.     Assessment/Plan Mr. Zajicek is a 78 year old gentleman with hypertension, hyperlipidemia, type 2 diabetes mellitus, non-oxygen dependent COPD, coronary artery disease status post CABG, CKD stage IV, right bundle branch block, diastolic heart failure, paroxysmal atrial fibrillation on Eliquis, aortic stenosis status post TAVR, morbid obesity and venous insufficiency who was initially admitted for CHF exacerbation.  1.  CKD stage IV: Admitted for acute on chronic diastolic heart failure exacerbation and responded very well to aggressive diuresis with IV Lasix.  Was noted to have mild increase in his creatinine from his baseline of 2.4-3.  His serum creatinine peaked to 3.7 however stabilized.  He is not oliguric or anuric.  In addition, his GFR is stable and has been around 14-20 in the past year.  His metabolic panel does not reveal acidosis or electrolyte abnormalities.  His urine output in the last 4 hours was noted at 1.7 L with about 0.7 cc/Kg/hour.  I have no doubt that his renal function will continue to worsen given his advanced heart failure and severe pulmonary hypertension.  Per discussion by the nephrology team and his primary team, he does not agree to dialysis but however is receptive to outpatient nephrology appointment. - Outpatient nephrology follow-up - Follow-up metabolic panel and CBC - Continue p.o. Lasix 80 mg BID  2.   Acute on chronic heart failure with preserved ejection fraction: He responded very well to aggressive IV diuresis with Lasix and had an impressive urine output even after transitioning from IV to p.o. Lasix. - Continue p.o. Lasix - Advice on dietary indiscretion and sedentary lifestyle  3.  Atrial fibrillation: Irregular heart rate on rotation.  His CHA2DSVASc score is 5 and is currently on Eliquis  4.  Pulmonary hypertension: Noted to have an elevated RSVP on echocardiogram.  He was started on sildenafil during this admission and will be discharged with medication.  5.  Coronary artery disease; aortic stenosis status post TAVR: Continue standard therapy with aspirin, beta-blocker (metoprolol) and Crestor  6.  Diabetes mellitus-uncontrolled: His A1c on 06/13/2019 was  8.6%.  During this admission he has been managed with basal insulin and sliding scale insulin.  Erendira Crabtree K Mirra Basilio 03/17/2019, 8:44 AM

## 2019-03-18 ENCOUNTER — Telehealth: Payer: Self-pay | Admitting: *Deleted

## 2019-03-18 ENCOUNTER — Telehealth: Payer: Self-pay

## 2019-03-18 DIAGNOSIS — N184 Chronic kidney disease, stage 4 (severe): Secondary | ICD-10-CM

## 2019-03-18 DIAGNOSIS — J441 Chronic obstructive pulmonary disease with (acute) exacerbation: Secondary | ICD-10-CM

## 2019-03-18 DIAGNOSIS — I5033 Acute on chronic diastolic (congestive) heart failure: Secondary | ICD-10-CM

## 2019-03-18 DIAGNOSIS — J9601 Acute respiratory failure with hypoxia: Secondary | ICD-10-CM

## 2019-03-18 DIAGNOSIS — J449 Chronic obstructive pulmonary disease, unspecified: Secondary | ICD-10-CM

## 2019-03-18 DIAGNOSIS — I2510 Atherosclerotic heart disease of native coronary artery without angina pectoris: Secondary | ICD-10-CM

## 2019-03-18 DIAGNOSIS — I272 Pulmonary hypertension, unspecified: Secondary | ICD-10-CM

## 2019-03-18 NOTE — Telephone Encounter (Signed)
Misty aware.//ah

## 2019-03-18 NOTE — Telephone Encounter (Signed)
Transition Care Management Follow-up Telephone Call   Date discharged? 05.19.2020    How have you been since you were released from the hospital? Doing pretty good still weak    Do you understand why you were in the hospital? yes   Do you understand the discharge instructions? yes   Where were you discharged to? Home    Items Reviewed:  Medications reviewed: yes  Allergies reviewed: yes  Dietary changes reviewed: N/A   Referrals reviewed: N/A   Functional Questionnaire:   Activities of Daily Living (ADLs):   He states they are independent in the following: ambulation, bathing and hygiene, feeding, continence, grooming, toileting and dressing States they require assistance with the following: N/A   Any transportation issues/concerns?: no   Any patient concerns? no   Confirmed importance and date/time of follow-up visits scheduled  Patient wife did not want to schedule an appointment d/t patient having [place] and OT coming out and she not knowing schedule.     Provider Appointment booked with NOT Scheduled   Confirmed with patient if condition begins to worsen call PCP or go to the ER.  Patient was given the office number and encouraged to call back with question or concerns.  : yes

## 2019-03-18 NOTE — Telephone Encounter (Signed)
Agree 

## 2019-03-18 NOTE — Telephone Encounter (Signed)
Misty with [facility] called wanting verbal plan of care orders for nursing and PT: Once a week for 1 week Twice a week for 1 week Once a week for 2 weeks Please review and advise.//ah

## 2019-03-19 ENCOUNTER — Telehealth: Payer: Self-pay

## 2019-03-19 NOTE — Telephone Encounter (Signed)
Pt drug plan will NOT approve sildenafil but says Revatio may be an alternative; Can we do this? If so what mg and directions for use

## 2019-03-19 NOTE — Telephone Encounter (Signed)
There is only a 20 mg tablet available

## 2019-03-19 NOTE — Telephone Encounter (Signed)
20 mg tid

## 2019-03-20 ENCOUNTER — Telehealth: Payer: Self-pay

## 2019-03-20 NOTE — Telephone Encounter (Signed)
Pt's physical therapy called to say pt fell last night and was able to get up on his own and had no injury. Just an [place]

## 2019-03-20 NOTE — Telephone Encounter (Signed)
Okay for additional orders. Should be on at least 3 L continuous oxygen. I will see him for follow up in office in the coming week or so.  Thanks MJP

## 2019-03-20 NOTE — Telephone Encounter (Signed)
Physical therapy called for additiional orders recommend 2 times a week for 3 weeks for functional mobility; O2 room air is 80 with oxygen up to 85 quickly returned; is it ok for the additional orders [phone]

## 2019-03-20 NOTE — Telephone Encounter (Signed)
Pt wife states that they will schedule hospital follow up at a later time per J. Rose Clinical supervisor.

## 2019-03-25 ENCOUNTER — Telehealth: Payer: Self-pay

## 2019-03-25 NOTE — Telephone Encounter (Signed)
I spoke with patient's wife and addressed their concerns. Asked them to take diuretic afternoon dose no later than 2 PM> @ Front desk: I dont see a sooner appt scheduled for him. I took the liberty to ask them to come on 06/01 at 1:30 PM for hospital f/u/TOC  @ MA's: Did we make a TOC call for him?               Also, can you please touch base with [facility] and request wound care for both ankles and sacral area pressure ulcers, please?  Thanks MJP

## 2019-03-25 NOTE — Telephone Encounter (Signed)
Pt's daughter called and asked if his dieretic can be taken differently, as the patient is up all night going to the bathroom.

## 2019-03-26 ENCOUNTER — Other Ambulatory Visit: Payer: Self-pay

## 2019-03-26 ENCOUNTER — Other Ambulatory Visit: Payer: Self-pay | Admitting: Cardiology

## 2019-03-26 MED ORDER — CLOTRIMAZOLE-BETAMETHASONE 1-0.05 % EX CREA: TOPICAL_CREAM | CUTANEOUS | 2 refills | Status: AC

## 2019-03-26 NOTE — Telephone Encounter (Signed)
Location of hospitalization: [place] Reason for hospitalization: CHF Date of discharge: 03/17/19 Date of first communication with patient: today Person contacting patient: Me Current symptoms: n/a spoke with Doctor Patwardhan, last night  Do you understand why you were in the Hospital: Yes Questions regarding discharge instructions: None Where were you discharged to: Home Medications reviewed: Yes Allergies reviewed: Yes Dietary changes reviewed: Yes. Discussed low fat and low salt diet.  Referals reviewed: NA Activities of Daily Living: Able to with mild limitations Any transportation issues/concerns: None Any patient concerns: None Confirmed importance & date/time of Follow up appt: Yes Confirmed with patient if condition begins to worsen call. Pt was given the office number and encouraged to call back with questions or concerns: Yes

## 2019-03-28 NOTE — Telephone Encounter (Signed)
I spoke with patient's daughter  Shauna Hugh, patient getting weaker, had a fall last night, I was able to do video conferencing, he appears chronically ill, alert and oriented x3, I discussed with the patient and it appears that the patient's family is having a hard time taking care of him at home and would like to put him in a facility for long-term care.  Patient is extremely ill, I had a long discussion regarding end-of-life including hospice care at home, patient also states that his time has come, family to decide on whether they want to do home hospice or whether they are going to take him to the hospital as they are having difficulty in taking care of him.  His urine output has reduced, he is unable to get up and stand.  This was a 30-minute encounter with the patient and his daughter discussions regarding nearly end-stage renal disease, not a candidate for dialysis, chronic diastolic heart failure, chronic cor pulmonale with hypoxemia and severe pulmonary hypertension.  He will certainly qualify for hospice care as I think that his survival beyond 6 months chances are less than 50%, probably less than 25%.

## 2019-03-29 NOTE — Telephone Encounter (Signed)
After the discussion I have had with the daughter Shauna Hugh yesterday, she called me and stated that patient is not willing to go to the hospital for evaluation, would like [place] to arrange for home hospice.  Wilson for consultation regarding the same on Monday 03/30/2019.

## 2019-03-30 ENCOUNTER — Other Ambulatory Visit: Payer: Self-pay

## 2019-03-30 ENCOUNTER — Telehealth: Payer: Self-pay | Admitting: *Deleted

## 2019-03-30 ENCOUNTER — Ambulatory Visit (INDEPENDENT_AMBULATORY_CARE_PROVIDER_SITE_OTHER): Payer: Medicare Other | Admitting: Cardiology

## 2019-03-30 DIAGNOSIS — I5032 Chronic diastolic (congestive) heart failure: Secondary | ICD-10-CM | POA: Diagnosis not present

## 2019-03-30 DIAGNOSIS — N185 Chronic kidney disease, stage 5: Secondary | ICD-10-CM | POA: Diagnosis not present

## 2019-03-30 DIAGNOSIS — I129 Hypertensive chronic kidney disease with stage 1 through stage 4 chronic kidney disease, or unspecified chronic kidney disease: Secondary | ICD-10-CM

## 2019-03-30 DIAGNOSIS — Z515 Encounter for palliative care: Secondary | ICD-10-CM

## 2019-03-30 DIAGNOSIS — F419 Anxiety disorder, unspecified: Secondary | ICD-10-CM | POA: Diagnosis not present

## 2019-03-30 MED ORDER — ALPRAZOLAM 0.25 MG PO TABS: 0.2500 mg | ORAL_TABLET | Freq: Every evening | ORAL | 0 refills | Status: DC | PRN

## 2019-03-30 NOTE — Telephone Encounter (Signed)
Copied from [place] [id number]. Topic: General - Other >> Mar 30, 2019 12:51 PM Oneta Rack wrote: Caller name: Jenn  Relation to pt: [facility]  Call back number: [phone]    Reason for call:  Inquiring if Dr. Volanda Napoleon will be attending physician, please advise

## 2019-03-31 NOTE — Progress Notes (Signed)
   Virtual Visit via Video Note   Subjective:   Cameron [name], male    DOB: 08/06/1941, 78 y.o.   MRN: [id number]   I connected with the patient on 03/31/19 by a video enabled telemedicine application and verified that I am speaking with the correct person using two identifiers.     I discussed the limitations of evaluation and management by telemedicine and the availability of in person appointments. The patient expressed understanding and agreed to proceed.   This visit type was conducted due to national recommendations for restrictions regarding the COVID-19 Pandemic (e.g. social distancing).  This format is felt to be most appropriate for this patient at this time.  All issues noted in this document were discussed and addressed.  No physical exam was performed (except for noted visual exam findings with Tele health visits).  The patient has consented to conduct a Tele health visit and understands insurance will be billed.    Chief complaint:  Shortness of breath  78 y/o Caucasian male with severe aortic stenosis s/p TAVR 26 mm Edwards Sapien 3 on 12/03/2017, CAD s/p CABG (LIMA-LAD, seq SVG-PDA, PLA, seq SVG-OM1, OM2, SVG-Diag1), mild to mod mitral and tricuspid regurgitation, mod PH, PAD s/p PTA Rt prox SFA 12/2017, paroxysmal Afib, hypertension, type 2 DM, advanced CKD, moderate grp II pulmonary hypertension.  Today's visit was regarding discussion of Mr. Shall, wife Uzziel Russey, as well as daughters.   Patient has had declining functional capacity over the last several month. He has had recurrent shortness of breath, leg edema, pressure ulcer on sacrum, as well as nonhealing wounds on bilateral ankles.   More importantly, he continues to have worsening renal function. I have had discussions throughout regarding seeking nephrology care and considering dialysis. However, patient is certain that he does not want to be on dialysis. His overall prognosis is poor due to multiple medical  comorbidities.   Today, patient unequivocally stated that he would like to pursue home hospice options. He would not like any resuscitation, hospitalization, any aggressive medical treatment- including enteric feeding.  I have made a referral to to home hospice. All necessary care should be provided at home.   At patient and family's request, I have given a prescription of Xanax for anxiety. Although not recommended in geriatric population, I think it is appropriate to use this medication for symptomatic relief in patient with DNR wishes.   Time spent: 25 min  [place], MD [facility]. [place] Pager: [phone] Office: [phone] If no answer Cell [phone]

## 2019-04-01 ENCOUNTER — Other Ambulatory Visit: Payer: Self-pay | Admitting: Cardiology

## 2019-04-01 DIAGNOSIS — Z515 Encounter for palliative care: Secondary | ICD-10-CM

## 2019-04-01 DIAGNOSIS — F419 Anxiety disorder, unspecified: Secondary | ICD-10-CM

## 2019-04-01 MED ORDER — ALPRAZOLAM 0.25 MG PO TABS: 0.2500 mg | ORAL_TABLET | Freq: Every evening | ORAL | 0 refills | Status: AC | PRN

## 2019-04-02 ENCOUNTER — Telehealth: Payer: Self-pay | Admitting: Family Medicine

## 2019-04-02 NOTE — Telephone Encounter (Signed)
Copied from [place] [id number]. Topic: Quick Communication - See Telephone Encounter >> Apr 02, 2019  4:58 PM Rutherford Nail, NT wrote: CRM for notification. See Telephone encounter for: 04/02/19. Sam with [facility] calling and states that the patient had decline quite precipitously. States that he had a reaction to xanax which made him more agitated. They discontinued xanax. States he has not eaten in a couple days and is minimally responsive, except for agitation. States that he is having increased secretions that he is not swallowing. Attempting to get admitted to [facility] for hospice care, but they have no beds available today. States that the patient has been started on seroquil for agitation- 25mg  in morning and 50mg  in evening. Also, started on Robinul for increase secretion- 2mg  3x a day for increase secretions. States that they are hoping for admission for hospice at [facility].  CB#: [phone]

## 2019-04-02 NOTE — Telephone Encounter (Signed)
ok 

## 2019-04-03 ENCOUNTER — Telehealth: Payer: Self-pay

## 2019-04-07 DIAGNOSIS — F419 Anxiety disorder, unspecified: Secondary | ICD-10-CM | POA: Insufficient documentation

## 2019-04-07 DIAGNOSIS — Z515 Encounter for palliative care: Secondary | ICD-10-CM | POA: Insufficient documentation

## 2019-04-17 ENCOUNTER — Encounter: Payer: Self-pay | Admitting: Thoracic Surgery (Cardiothoracic Vascular Surgery)

## 2019-04-20 ENCOUNTER — Ambulatory Visit: Payer: Medicare Other | Admitting: Cardiology

## 2019-04-29 NOTE — Telephone Encounter (Signed)
Pt passed at 6:58am at home.   Sam/[facility] [phone]  Dr. Volanda Napoleon - FYI. Thank you.

## 2019-04-29 NOTE — Telephone Encounter (Signed)
FYI

## 2019-04-29 NOTE — Telephone Encounter (Signed)
Called [facility] regarding pt, Nurse state that pt is deceased. Dr Volanda Napoleon was notified

## 2019-04-29 DEATH — deceased

## 2020-04-13 IMAGING — CR DG FOOT COMPLETE 3+V*R*
3 series · 3 of 3 positions shown · non-contrast
Comparison: None.

CLINICAL DATA: Right ankle pain after fall yesterday.

EXAM:
RIGHT FOOT COMPLETE - 3+ VIEW

[foot ap]
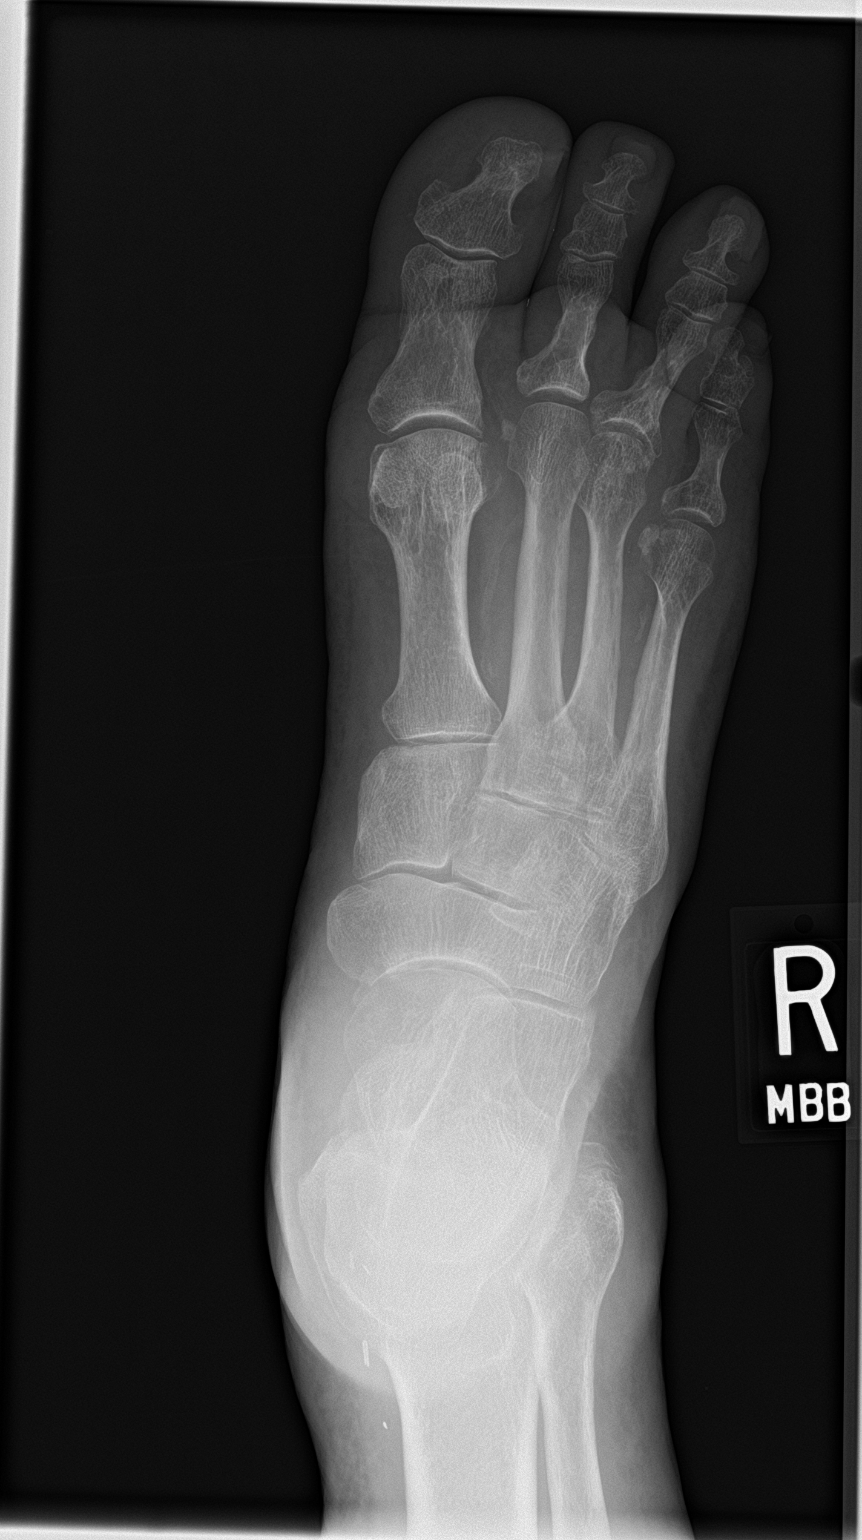

[foot obl]
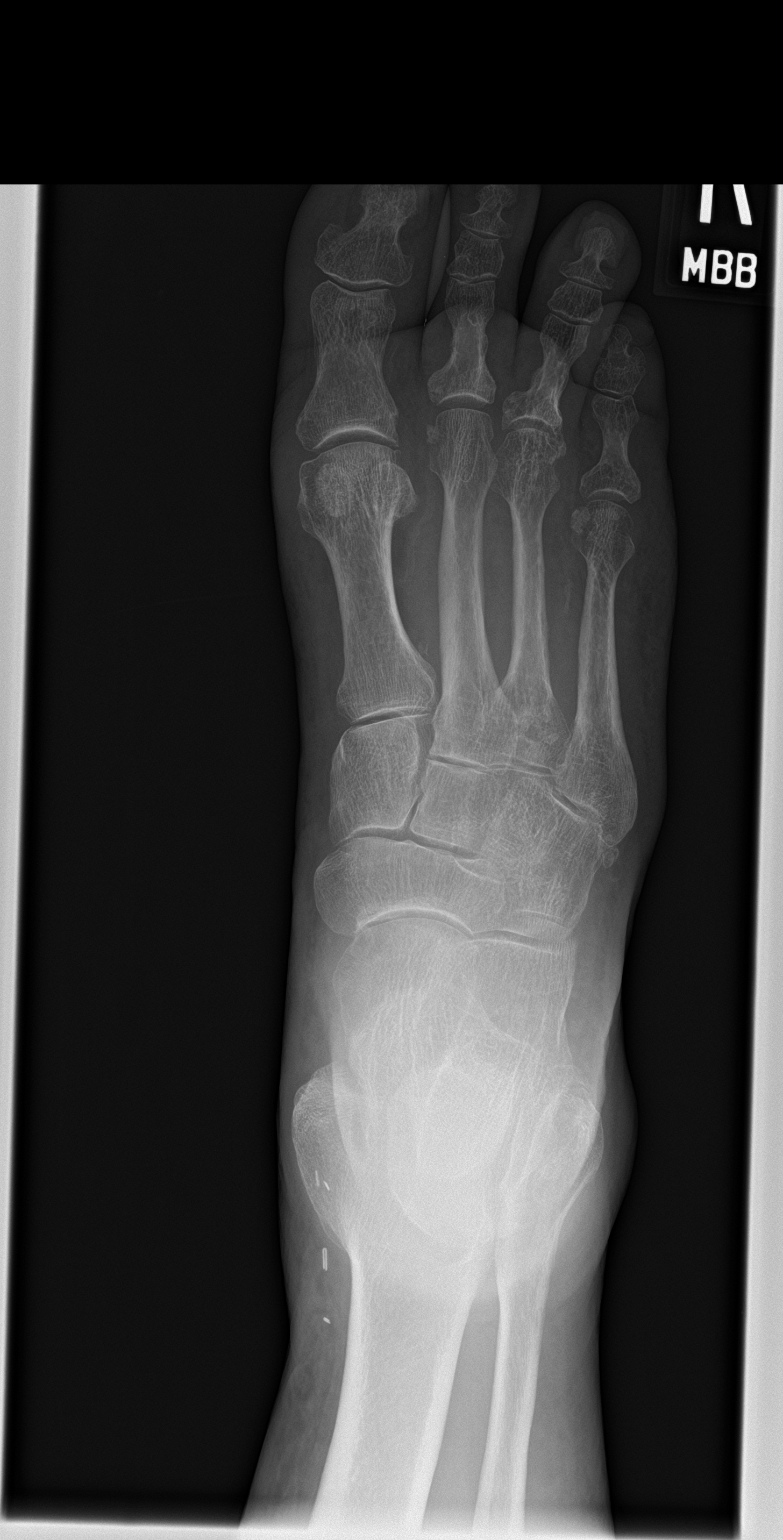

[foot lat]
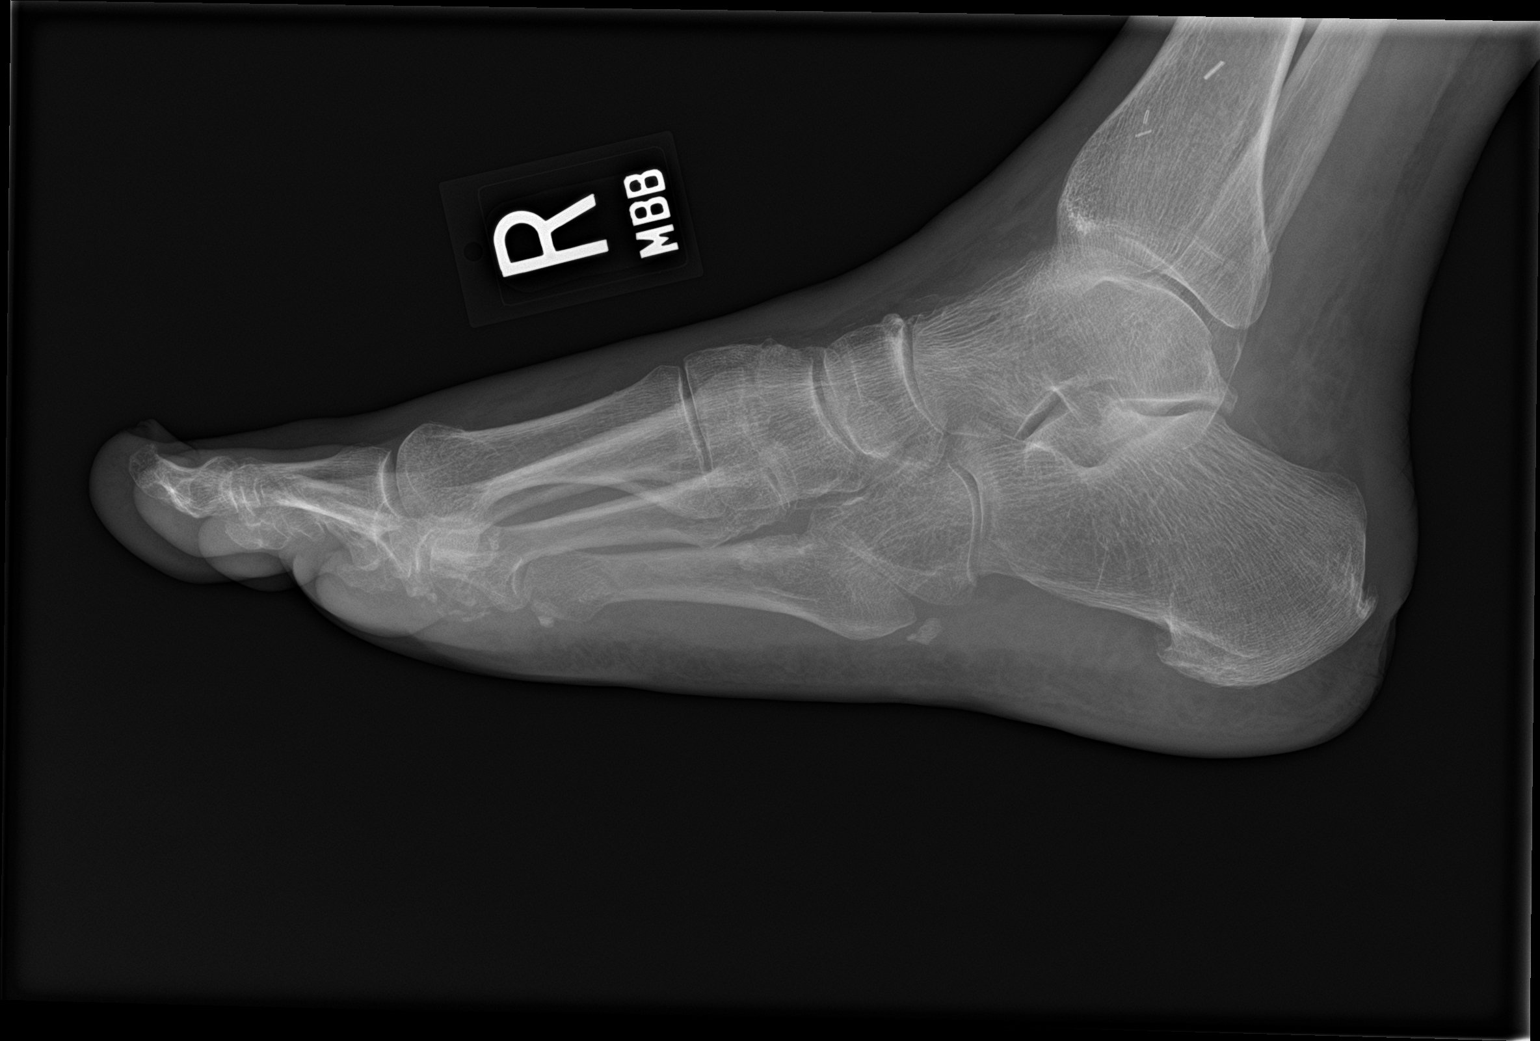

[3 of 3 positions shown; findings below may reference images not displayed]

FINDINGS: Only 4 toes are noted which appears to be congenital anomaly. No
fracture or dislocation is noted. Minimal spurring of posterior
calcaneus is noted. Soft tissues are unremarkable.
IMPRESSION: No acute abnormality seen in the right foot.

## 2020-04-13 IMAGING — CR DG ANKLE COMPLETE 3+V*R*
3 series · 3 of 3 positions shown · non-contrast
Comparison: None.

CLINICAL DATA: Acute right ankle pain after fall.

EXAM:
RIGHT ANKLE - COMPLETE 3+ VIEW

[ankle ap]
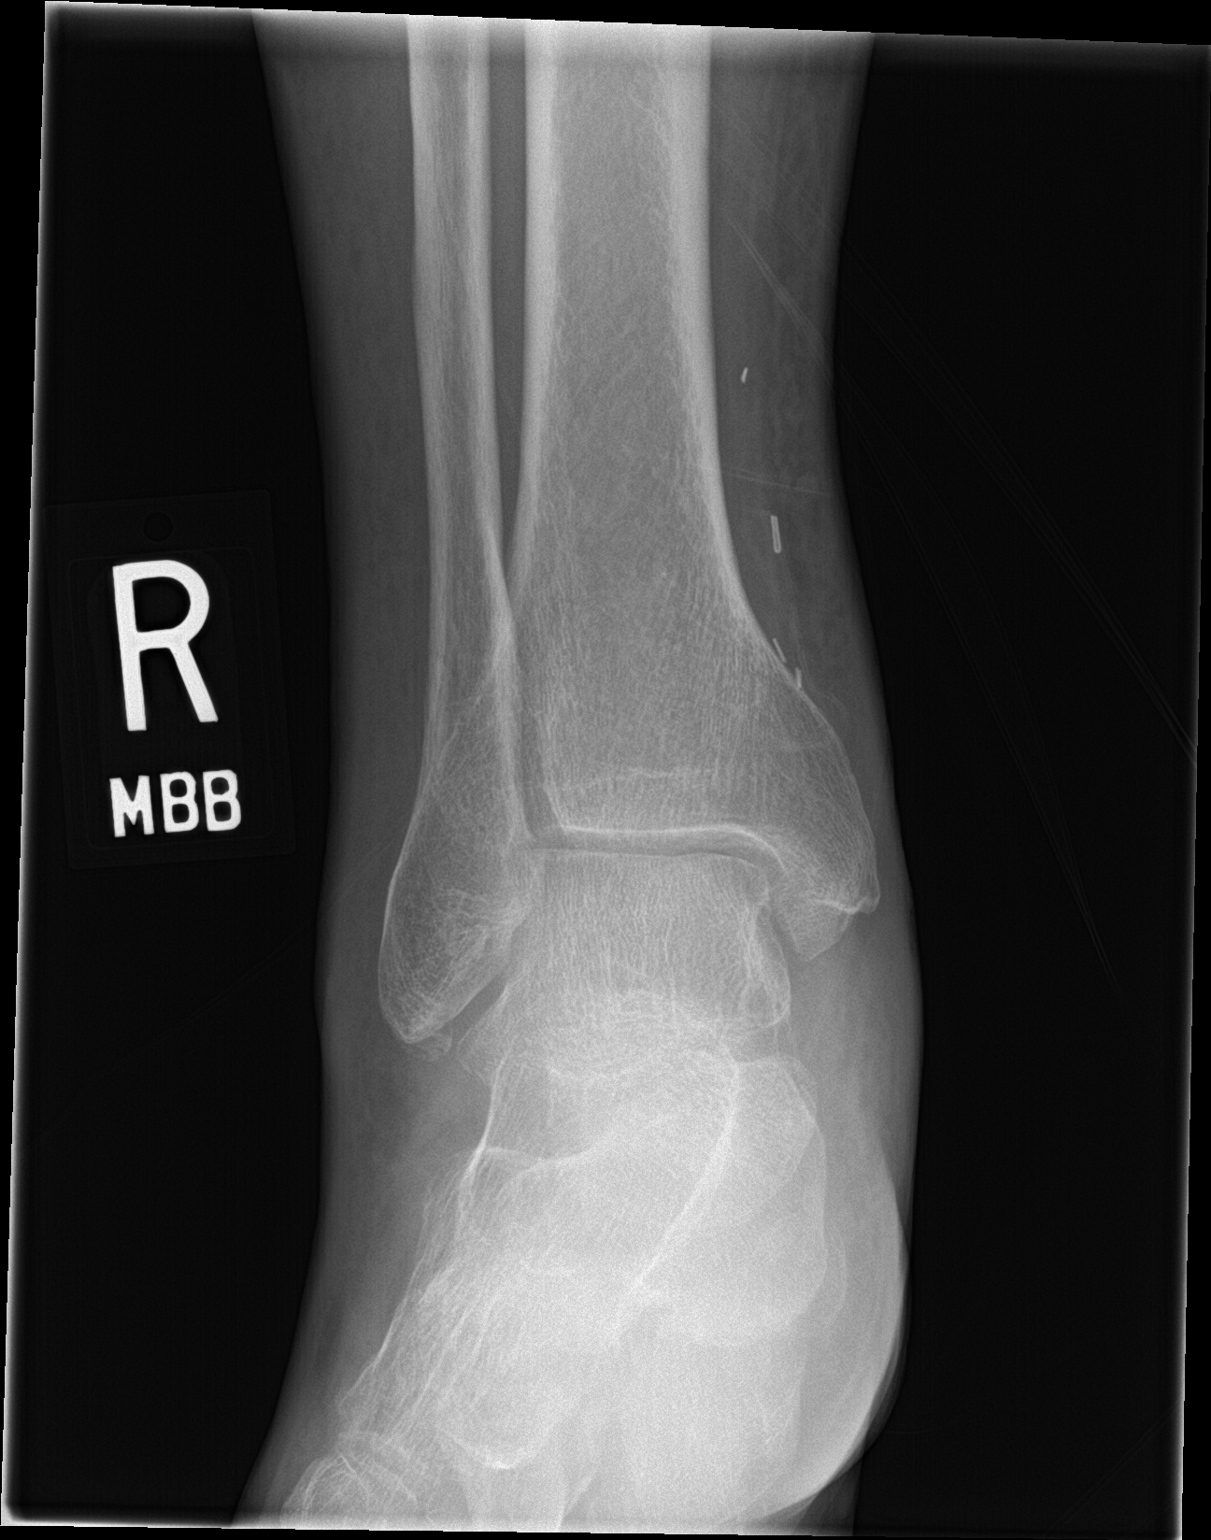

[ankle obl]
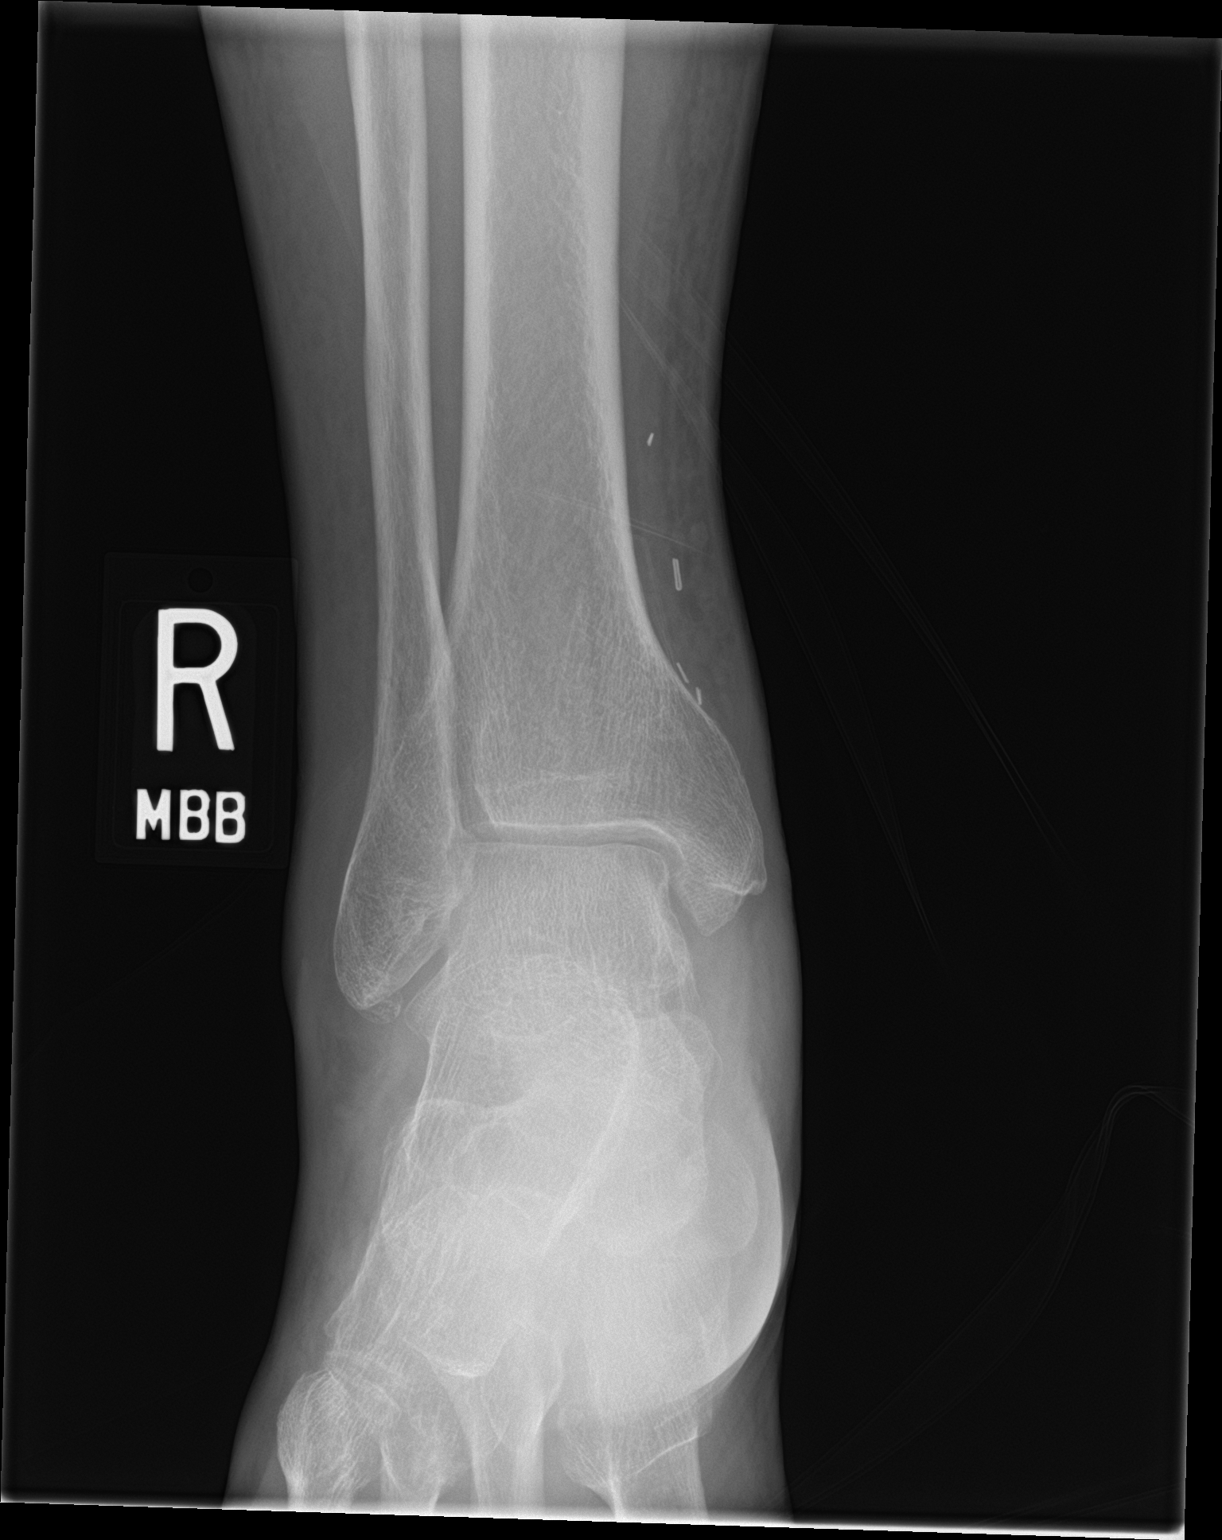

[ankle lat]
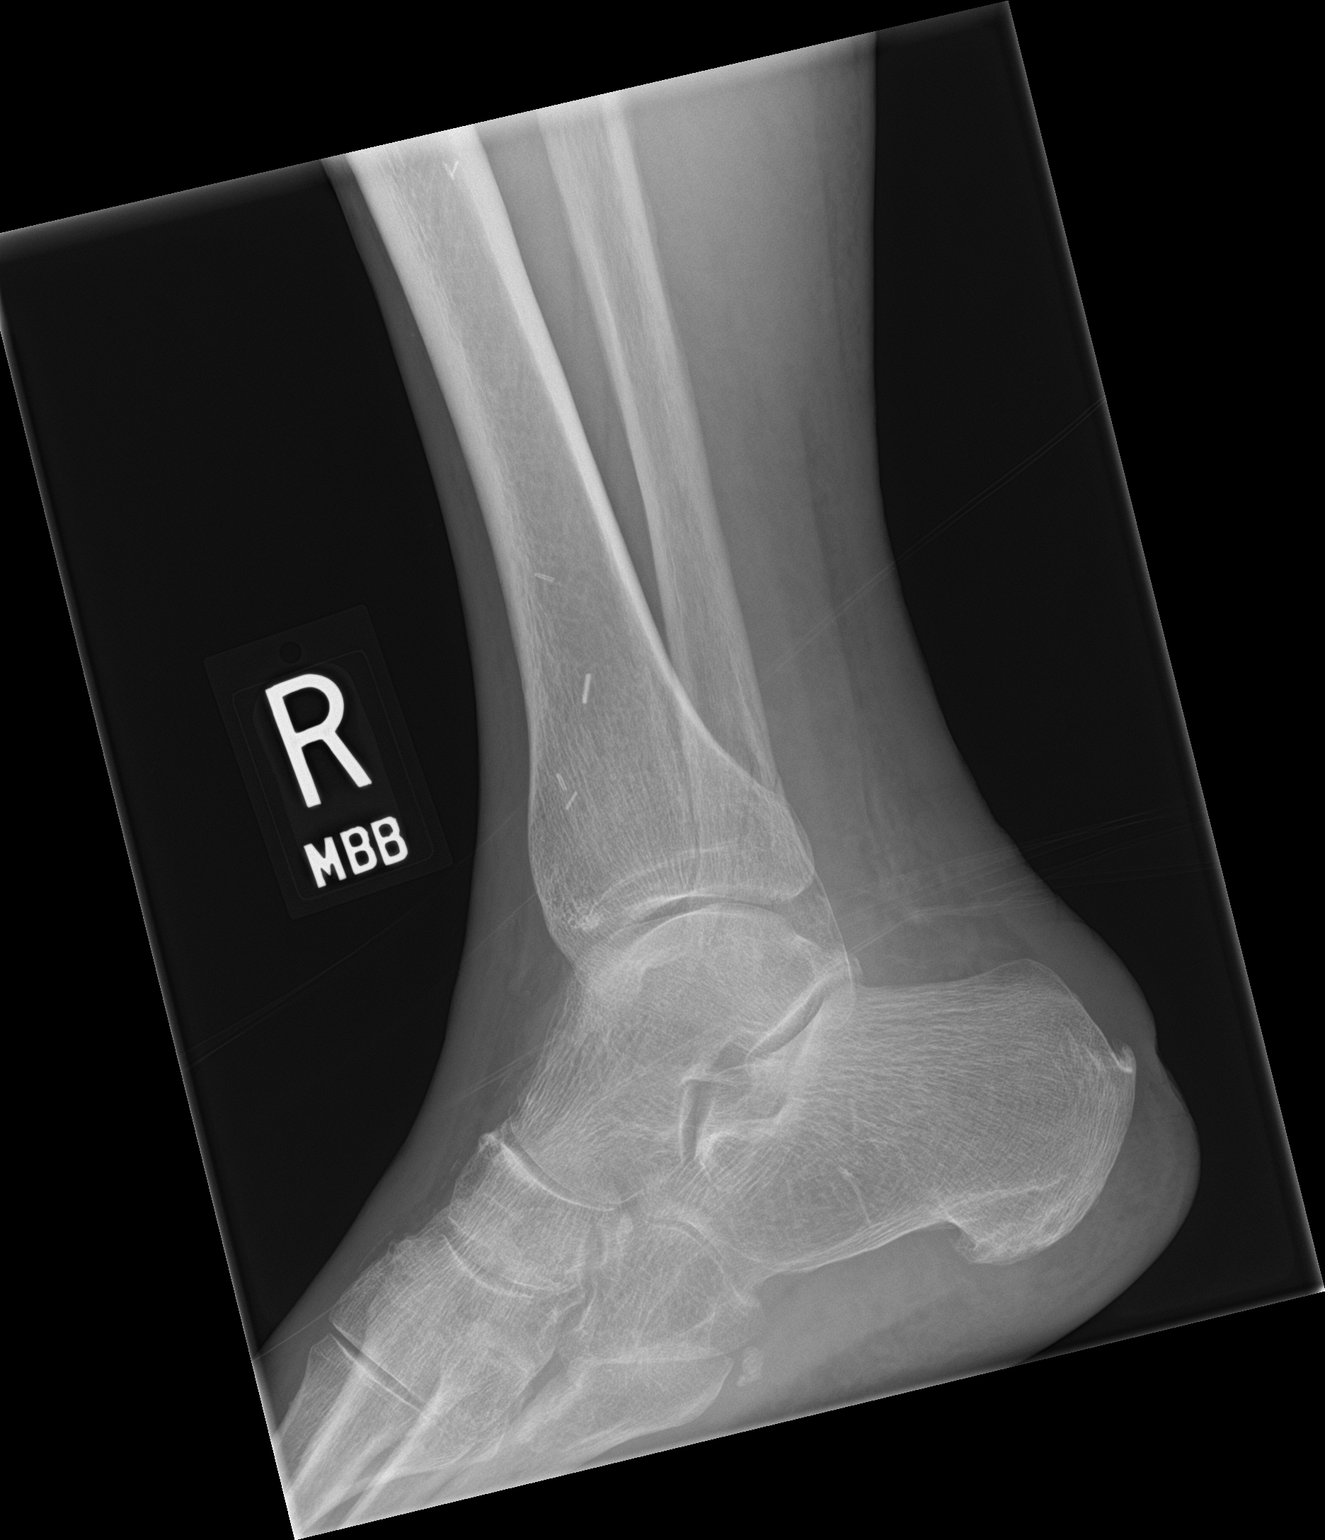

[3 of 3 positions shown; findings below may reference images not displayed]

FINDINGS: Nondisplaced oblique fracture is seen involving the distal right
fibula. Joint spaces are intact. Surgical clips are seen in the soft
tissues.
IMPRESSION: Nondisplaced distal right fibular fracture.
